# Patient Record
Sex: Male | Born: 1975 | Race: White | Hispanic: No | Marital: Single | Smoking: Current every day smoker
Health system: Southern US, Community
[De-identification: ages and names within clinical notes are randomized; demographics above are authoritative.]

## PROBLEM LIST (undated history)

## (undated) DIAGNOSIS — I10 Essential (primary) hypertension: Secondary | ICD-10-CM

## (undated) DIAGNOSIS — J45909 Unspecified asthma, uncomplicated: Secondary | ICD-10-CM

## (undated) DIAGNOSIS — F250 Schizoaffective disorder, bipolar type: Secondary | ICD-10-CM

## (undated) DIAGNOSIS — E119 Type 2 diabetes mellitus without complications: Secondary | ICD-10-CM

## (undated) DIAGNOSIS — Z5900 Homelessness unspecified: Secondary | ICD-10-CM

## (undated) DIAGNOSIS — Z765 Malingerer [conscious simulation]: Secondary | ICD-10-CM

## (undated) DIAGNOSIS — G629 Polyneuropathy, unspecified: Secondary | ICD-10-CM

---

## 1997-11-08 ENCOUNTER — Encounter: Admission: RE | Admit: 1997-11-08 | Discharge: 1997-11-08 | Payer: Self-pay | Admitting: *Deleted

## 1999-09-21 ENCOUNTER — Encounter: Payer: Self-pay | Admitting: Emergency Medicine

## 1999-09-21 ENCOUNTER — Encounter: Payer: Self-pay | Admitting: Surgery

## 1999-09-21 ENCOUNTER — Inpatient Hospital Stay (HOSPITAL_COMMUNITY): Admission: EM | Admit: 1999-09-21 | Discharge: 1999-09-22 | Payer: Self-pay | Admitting: Emergency Medicine

## 2002-02-02 ENCOUNTER — Emergency Department (HOSPITAL_COMMUNITY): Admission: EM | Admit: 2002-02-02 | Discharge: 2002-02-02 | Payer: Self-pay

## 2002-03-27 ENCOUNTER — Emergency Department (HOSPITAL_COMMUNITY): Admission: EM | Admit: 2002-03-27 | Discharge: 2002-03-27 | Payer: Self-pay | Admitting: Emergency Medicine

## 2002-06-15 ENCOUNTER — Inpatient Hospital Stay (HOSPITAL_COMMUNITY): Admission: EM | Admit: 2002-06-15 | Discharge: 2002-06-18 | Payer: Self-pay | Admitting: Psychiatry

## 2018-05-22 ENCOUNTER — Encounter (HOSPITAL_COMMUNITY): Payer: Self-pay | Admitting: Behavioral Health

## 2018-05-22 ENCOUNTER — Ambulatory Visit (HOSPITAL_COMMUNITY)
Admission: RE | Admit: 2018-05-22 | Discharge: 2018-05-22 | Disposition: A | Discharge status: Home / Self Care | Payer: Self-pay | Attending: Psychiatry | Admitting: Psychiatry

## 2018-05-22 DIAGNOSIS — F250 Schizoaffective disorder, bipolar type: Secondary | ICD-10-CM | POA: Insufficient documentation

## 2018-05-22 DIAGNOSIS — F17210 Nicotine dependence, cigarettes, uncomplicated: Secondary | ICD-10-CM | POA: Insufficient documentation

## 2018-05-22 NOTE — H&P (Signed)
Behavioral Health Medical Screening Exam  Alejandro Baker is an 43 y.o. male presents as a walk in at [facility] with complaints that he needs to get prescriptions for his medication because he just ran out but doesn't have a psychiatrist to see established.  Move to [place] form [place] in September 2019.  Patient denies suicidal/self-harm/homicidal ideation psychosis, and paranoia.     Total Time spent with patient: 30 minutes  Psychiatric Specialty Exam: Physical Exam  Constitutional: He is oriented to person, place, and time. He appears well-developed and well-nourished. No distress.  Neck: Normal range of motion. Neck supple.  Respiratory: Effort normal and breath sounds normal.  Musculoskeletal: Normal range of motion.  Neurological: He is alert and oriented to person, place, and time.  Skin: Skin is warm and dry.  Psychiatric: He has a normal mood and affect. His speech is normal and behavior is normal. Judgment and thought content normal. Cognition and memory are normal.    Review of Systems  Psychiatric/Behavioral: Depression: Denies. Hallucinations: Denies. Substance abuse: Denies. Suicidal ideas: denies. Nervous/anxious: Denies. Insomnia: Denies.        Patient has a history of schizophrenia and has ran out of his medication.  Patient states that he moved home from [place] to stay with his mother in September 2019.  States that he has not established outpatient psychiatric services but needs refill on his medications.  MD in [place]  won't do it for him.    All other systems reviewed and are negative.   Blood pressure (!) 135/92, pulse (!) 115, resp. rate 16.There is no height or weight on file to calculate BMI.  General Appearance: Casual  Eye Contact:  Good  Speech:  Clear and Coherent  Volume:  Normal  Mood:  Appropriate  Affect:  Congruent  Thought Process:  Coherent  Orientation:  Full (Time, Place, and Person)  Thought Content:  Denies hallucinations, delusions, and paranoia   Suicidal Thoughts:  No  Homicidal Thoughts:  No  Memory:  Immediate;   Fair Recent;   Fair Remote;   Fair  Judgement:  Intact  Insight:  Present  Psychomotor Activity:  Normal  Concentration: Concentration: Good and Attention Span: Good  Recall:  [employer] of Knowledge:Fair  Language: Good  Akathisia:  No  Handed:  Right  AIMS (if indicated):     Assets:  Communication Skills Desire for Improvement Housing Social Support  Sleep:       Musculoskeletal: Strength & Muscle Tone: within normal limits Gait & Station: normal Patient leans: N/A  Blood pressure (!) 135/92, pulse (!) 115, resp. rate 16.  Recommendations:  Outpatient psychiatric services.  Referral/resources given for outpatient psychiatric services.  Patient informed that he could do a walk in tomorrow morning at [facility]  Need to be there early 8AM - 3PM  Based on my evaluation the patient does not appear to have an emergency medical condition.   Disposition: No evidence of imminent risk to self or others at present.   Patient does not meet criteria for psychiatric inpatient admission. Supportive therapy provided about ongoing stressors. Discussed crisis plan, support from social network, calling 911, coming to the Emergency Department, and calling Suicide Hotline.  Shuvon Rankin, NP 05/22/2018, 2:12 PM

## 2018-05-22 NOTE — BH Assessment (Signed)
Behavioral Health Assessment Note:   Patient is a 43 year old male who presented to [facility] with his mother seeking medication prescriptions.  Patient states that he took his last dose of medication four days ago.  Patient states that he was living in [place], but moved back to [place] from [place] and patient states that he does not have a provider as of yet.  Patient states that he has been diagnosed with schizoaffective disorder bipolar type.  Patient states that he is not currently suicidal, homicidal or psychotic.  Patient states that he has a previous suicide attempt from 15 years ago and states that he was hospitalized at [facility] in 2004. Patient denies having any access to weapons.  Patient denies having any substance abuse issues. Patient states that he sleeps on average six hours per night and he states that his appetite is good.  He states that he has self-mutilated on one occasion 10-15 years ago and states that he never did it again. States that he has no history of any abuse.  Patient was pleasant and cooperative, alert and oriented.  His judgment, insight and impulse control were slightly impaired.  Patient states that he has been short tempered lately.  He did not appear to be responding to any internal stimuli. His speech was clear, his eye contact was good.  His psycho-motor activity was normal, his thoughts organized and his memory was intact.  Patient was able to contract for safety outside of a hospital setting.                                                                                                                                                                                Diagnosis: F25.0 Schizoaffective Disorder Bipolar Disorder  Past Medical History: No past medical history on file.   Family History: No family history on file.  Social History:  reports that he has been smoking. He has a 15.00 pack-year smoking history. He does not have any smokeless tobacco  history on file. He reports previous alcohol use. He reports previous drug use.  Additional Social History:  Alcohol / Drug Use Pain Medications: see MAR Prescriptions: see MAR Over the Counter: see MAR History of alcohol / drug use?: No history of alcohol / drug abuse Longest period of sobriety (when/how long): N/A  CIWA: CIWA-Ar BP: (!) 135/92 Pulse Rate: (!) 115 COWS:    Allergies: Allergies not on file  Home Medications: (Not in a hospital admission)   OB/GYN Status:  No LMP for male patient.  General Assessment Data Location of Assessment: [facility] Services TTS Assessment: In system Is this a Tele or Face-to-Face Assessment?: Face-to-Face Is this an Initial Assessment  or a Re-assessment for this encounter?: Initial Assessment Patient Accompanied by:: Parent Language Other than English: No Living Arrangements: Other (Comment)(staying with brother) What gender do you identify as?: Male Marital status: Single Living Arrangements: Other relatives Can pt return to current living arrangement?: Yes Admission Status: Voluntary Is patient capable of signing voluntary admission?: Yes Referral Source: Self/Family/Friend Insurance type: Counselling psychologist([employer] Medicare)     Crisis Care Plan Living Arrangements: Other relatives Legal Guardian: Other:(self) Name of Psychiatrist: none Name of Therapist: none  Education Status Is patient currently in school?: No Is the patient employed, unemployed or receiving disability?: Receiving disability income  Risk to self with the past 6 months Suicidal Ideation: No Has patient been a risk to self within the past 6 months prior to admission? : No Suicidal Intent: No Has patient had any suicidal intent within the past 6 months prior to admission? : No Is patient at risk for suicide?: No Suicidal Plan?: No Has patient had any suicidal plan within the past 6 months prior to admission? : No Access to Means: No What has been your use of  drugs/alcohol within the last 12 months?: none Previous Attempts/Gestures: Yes How many times?: 1(2004) Other Self Harm Risks: (off medication, rage issues) Triggers for Past Attempts: None known Intentional Self Injurious Behavior: Cutting(fifteen years ago) Comment - Self Injurious Behavior: (hx of cutting 15 years ago on one occasion) Family Suicide History: No Recent stressful life event(s): Other (Comment)([place] Persecutory voices/beliefs?: No Depression: Yes Depression Symptoms: Insomnia, Isolating, Loss of interest in usual pleasures Substance abuse history and/or treatment for substance abuse?: No Suicide prevention information given to non-admitted patients: Not applicable  Risk to Others within the past 6 months Homicidal Ideation: No Does patient have any lifetime risk of violence toward others beyond the six months prior to admission? : No Thoughts of Harm to Others: No Current Homicidal Intent: No Current Homicidal Plan: No Access to Homicidal Means: No Identified Victim: none History of harm to others?: No Assessment of Violence: None Noted Violent Behavior Description: none Does patient have access to weapons?: No Criminal Charges Pending?: No Does patient have a court date: No Is patient on probation?: No  Psychosis Hallucinations: None noted Delusions: None noted  Mental Status Report Appearance/Hygiene: Disheveled Eye Contact: Good Motor Activity: Freedom of movement Speech: Unremarkable Level of Consciousness: Alert Mood: Depressed Affect: Flat Anxiety Level: None Thought Processes: Coherent, Relevant Judgement: Partial Orientation: Person, Place, Time, Situation Obsessive Compulsive Thoughts/Behaviors: None  Cognitive Functioning Concentration: Decreased Memory: Recent Intact, Remote Intact Is patient IDD: No Insight: Fair Impulse Control: Poor Appetite: Good Have you had any weight changes? : No  Change Sleep: No Change Total Hours of Sleep: 6 Vegetative Symptoms: Decreased grooming  ADLScreening [facility] Assessment [facility]) Patient's cognitive ability adequate to safely complete daily activities?: Yes Patient able to express need for assistance with ADLs?: Yes Independently performs ADLs?: Yes (appropriate for developmental age)  Prior Inpatient Therapy Prior Inpatient Therapy: Yes Prior Therapy Dates: 2004 [facility] Reason for Treatment: schizoaffective disorder  Prior Outpatient Therapy Prior Outpatient Therapy: Yes Prior Therapy Dates: ongoing until move to [place] Prior Therapy Facilty/Provider(s): (unknown in [place]) Reason for Treatment: (schizophrenis) Does patient have an ACCT team?: Yes Does patient have Intensive In-House Services?  : No Does patient have [facility] services? : No Does patient have P4CC services?: No  ADL Screening (condition at time of admission) Patient's cognitive ability adequate to safely complete daily activities?: Yes Is the patient  deaf or have difficulty hearing?: No Does the patient have difficulty seeing, even when wearing glasses/contacts?: No Does the patient have difficulty concentrating, remembering, or making decisions?: No Patient able to express need for assistance with ADLs?: Yes Does the patient have difficulty dressing or bathing?: No Independently performs ADLs?: Yes (appropriate for developmental age) Does the patient have difficulty walking or climbing stairs?: No Weakness of Legs: None Weakness of Arms/Hands: None  Home Assistive Devices/Equipment Home Assistive Devices/Equipment: None  Therapy Consults (therapy consults require a physician order) PT Evaluation Needed: No OT Evalulation Needed: No SLP Evaluation Needed: No Abuse/Neglect Assessment (Assessment to be complete while patient is alone) Abuse/Neglect Assessment Can Be Completed: Yes Physical Abuse: Denies Verbal Abuse:  Denies Sexual Abuse: Denies Exploitation of patient/patient's resources: Denies Self-Neglect: Denies Values / Beliefs Cultural Requests During Hospitalization: None Spiritual Requests During Hospitalization: None Consults Spiritual Care Consult Needed: No Social Work Consult Needed: No Merchant navy officer (For Healthcare) Does Patient Have a Medical Advance Directive?: No Would patient like information on creating a medical advance directive?: No - Patient declined Nutrition Screen- [facility] Adult/WL/AP Has the patient recently lost weight without trying?: No Has the patient been eating poorly because of a decreased appetite?: No Malnutrition Screening Tool Score: 0      Disposition: Per Shuvon Rankin, NP, patient does not meet inpatient admission criteria and can be referred to an outpatient provider.  Patient was given referral information on [address], [address] of the [place], [place], [facility] and [facility] behavioral Health at the [facility].   Disposition Initial Assessment Completed for this Encounter: Yes Patient referred to: Other (Comment)  On Site Evaluation by:   Reviewed with Physician:    Arnoldo Lenis Yaslin Kirtley 05/22/2018 2:54 PM

## 2020-03-04 ENCOUNTER — Other Ambulatory Visit: Payer: Self-pay

## 2020-03-04 ENCOUNTER — Emergency Department (HOSPITAL_COMMUNITY)
Admission: EM | Admit: 2020-03-04 | Discharge: 2020-03-04 | Disposition: A | Discharge status: Home / Self Care | Payer: Medicare Other | Attending: Emergency Medicine | Admitting: Emergency Medicine

## 2020-03-04 ENCOUNTER — Encounter (HOSPITAL_COMMUNITY): Payer: Self-pay

## 2020-03-04 DIAGNOSIS — Z719 Counseling, unspecified: Secondary | ICD-10-CM

## 2020-03-04 DIAGNOSIS — Z1152 Encounter for screening for COVID-19: Secondary | ICD-10-CM | POA: Diagnosis present

## 2020-03-04 DIAGNOSIS — R456 Violent behavior: Secondary | ICD-10-CM | POA: Diagnosis not present

## 2020-03-04 DIAGNOSIS — Z710 Person encountering health services to consult on behalf of another person: Secondary | ICD-10-CM | POA: Diagnosis not present

## 2020-03-04 DIAGNOSIS — F17200 Nicotine dependence, unspecified, uncomplicated: Secondary | ICD-10-CM | POA: Insufficient documentation

## 2020-03-04 NOTE — ED Provider Notes (Signed)
[facility]-EMERGENCY DEPT Provider Note   CSN: [id number] Arrival date & time: 03/04/20  1049     History Chief Complaint  Patient presents with  . Mental evaluation  . Rash    Alejandro Baker is a 44 y.o. male.  HPI      Alejandro Baker is a 44 y.o. male, presenting to the ED apparently requesting testing for COVID-19.  This information was only able to be obtained through the triage note as the patient refused to speak with me.  Upon my attempt at initial interview, patient continued to sleep.  When I tried to wake him, he eventually turned over, pointed at me, and yelled, "Fuck off! If you fucking touch me or talk to me again, I'll fucking shut you up!"  I told him I was trying to help him and that yelling, foul language, threats, and aggressive behavior would not be tolerated.   When I returned to speak with the patient, this time he jumped out of the bed quickly and aggressively ran towards me yelling profanities. He got within a couple feet of me, stuck his finger in my face and screamed, "If you don't leave me alone, I will fuck you up!"  History reviewed. No pertinent past medical history.  There are no problems to display for this patient.   History reviewed. No pertinent surgical history.     No family history on file.  Social History   Tobacco Use  . Smoking status: Current Every Day Smoker    Packs/day: 1.00    Years: 15.00    Pack years: 15.00  Substance Use Topics  . Alcohol use: Not Currently  . Drug use: Not Currently    Home Medications Prior to Admission medications   Not on File    Allergies    Patient has no known allergies.  Review of Systems   Review of Systems  Unable to perform ROS: Other (Refused to answer any questions)    Physical Exam Updated Vital Signs BP 132/86 (BP Location: Left Arm)   Pulse 90   Temp 98.2 F (36.8 C) (Oral)   Resp 16   SpO2 98%   Physical Exam Vitals and nursing note reviewed.   Constitutional:      General: He is not in acute distress.    Appearance: He is well-developed. He is not diaphoretic.  HENT:     Head: Normocephalic and atraumatic.  Eyes:     Conjunctiva/sclera: Conjunctivae normal.  Cardiovascular:     Rate and Rhythm: Normal rate.     Comments: Normal rate per triage vitals. Pulmonary:     Effort: Pulmonary effort is normal.  Musculoskeletal:     Cervical back: Neck supple.  Skin:    General: Skin is warm and dry.     Coloration: Skin is not pale.  Neurological:     Mental Status: He is alert.     Comments: Moves all 4 extremities.  Coordinated and able to ambulate quickly without noted difficulty. No noted facial droop or slurred speech. He was able to identify the different persons in the ED by role.  Psychiatric:        Mood and Affect: Affect is angry.        Behavior: Behavior is aggressive.     ED Results / Procedures / Treatments   Labs (all labs ordered are listed, but only abnormal results are displayed) Labs Reviewed - No data to display  EKG None  Radiology No  results found.  Procedures Procedures (including critical care time)  Medications Ordered in ED Medications - No data to display  ED Course  I have reviewed the triage vital signs and the nursing notes.  Pertinent labs & imaging results that were available during my care of the patient were reviewed by me and considered in my medical decision making (see chart for details).    MDM Rules/Calculators/A&P                          Patient presents reportedly requesting Covid testing at time of triage. Patient was verbally abusive and physically aggressive. These threats and behaviors were focused and had a discrete beginning and end during interactions I had with him.   Patient's presentation does not seem to be consistent with emergent mental health crisis, psychosis, or other emergency medical condition. No reported SI/HI. Patient was offered Covid  testing by the nurse prior to discharge, but declined, reportedly stating, "I do not need anything.  I just want to be escorted out."  Findings and plan of care discussed with Marguarite Arbour, MD.   Final Clinical Impression(s) / ED Diagnoses Final diagnoses:  Consultation without specific complaint    Rx / DC Orders ED Discharge Orders    None       Concepcion Living 03/04/20 1357    Terald Sleeper, MD 03/04/20 1422

## 2020-03-04 NOTE — ED Triage Notes (Addendum)
Pt presents with police voluntary. Police report he is here for some "unspecified mental evaluation". Pt reports he has poison ivy and is here for a covid test so he can go back to the homeless shelter. Pt is not the best historian. Pt does not deny SI/HI.

## 2020-03-04 NOTE — ED Notes (Addendum)
Pt asked if he could be swabbed for COVID.  Pt declined and stated, "I want to be escorted out."  Pt declined any further care including obtaining vital signs. Security guard present. Shawn, PA made aware.

## 2020-03-04 NOTE — ED Notes (Signed)
Went into room with PA to attempt to assess and pt was asleep.  Pt was woken up and pt verbally threatened medical staff. Pt stated, "If he doesn't get out of my face, I'm going to hit him."

## 2020-03-04 NOTE — ED Notes (Signed)
Pt has been changed into scrubs and wanded by Alvino Chapel with security. Pt has 1 huge clear plastic bag full of belongings and it is located in the day room in [place]

## 2020-03-15 ENCOUNTER — Emergency Department (HOSPITAL_COMMUNITY)
Admission: EM | Admit: 2020-03-15 | Discharge: 2020-03-15 | Disposition: A | Discharge status: Home / Self Care | Payer: Medicare Other | Attending: Emergency Medicine | Admitting: Emergency Medicine

## 2020-03-15 DIAGNOSIS — R739 Hyperglycemia, unspecified: Secondary | ICD-10-CM | POA: Insufficient documentation

## 2020-03-15 DIAGNOSIS — Z5900 Homelessness unspecified: Secondary | ICD-10-CM | POA: Diagnosis not present

## 2020-03-15 DIAGNOSIS — X31XXXA Exposure to excessive natural cold, initial encounter: Secondary | ICD-10-CM | POA: Insufficient documentation

## 2020-03-15 DIAGNOSIS — F17200 Nicotine dependence, unspecified, uncomplicated: Secondary | ICD-10-CM | POA: Diagnosis not present

## 2020-03-15 DIAGNOSIS — T699XXA Effect of reduced temperature, unspecified, initial encounter: Secondary | ICD-10-CM

## 2020-03-15 LAB — CBG MONITORING, ED: Glucose-Capillary: 280 mg/dL — ABNORMAL HIGH (ref 70–99)

## 2020-03-15 NOTE — ED Provider Notes (Signed)
[facility]-EMERGENCY DEPT Provider Note   CSN: [id number] Arrival date & time: 03/15/20  0215     History Chief Complaint  Patient presents with  . Cold Exposure    Alejandro Baker is a 44 y.o. male.   44 year old male transported to the emergency department by EMS who found the patient sleeping outside.  He has no acute complaints; denies pain.  Reports being a diabetic, but is not on insulin.  He has been homeless for approximately 2 months.  Recently came back to [place].  The history is provided by the patient. No language interpreter was used.       No past medical history on file.  There are no problems to display for this patient.   No past surgical history on file.     No family history on file.  Social History   Tobacco Use  . Smoking status: Current Every Day Smoker    Packs/day: 1.00    Years: 15.00    Pack years: 15.00  Substance Use Topics  . Alcohol use: Not Currently  . Drug use: Not Currently    Home Medications Prior to Admission medications   Not on File    Allergies    Penicillins and Povidone-iodine  Review of Systems   Review of Systems  Ten systems reviewed and are negative for acute change, except as noted in the HPI.    Physical Exam Updated Vital Signs BP 109/84   Pulse 81   Temp 98.2 F (36.8 C) (Oral)   Resp (!) 29   Ht 6' (1.829 m)   Wt 79.8 kg   SpO2 97%   BMI 23.87 kg/m   Physical Exam Vitals and nursing note reviewed.  Constitutional:      General: He is not in acute distress.    Appearance: He is well-developed. He is not diaphoretic.     Comments: Nontoxic appearing. Tanned skin.  HENT:     Head: Normocephalic and atraumatic.  Eyes:     General: No scleral icterus.    Conjunctiva/sclera: Conjunctivae normal.  Pulmonary:     Effort: Pulmonary effort is normal. No respiratory distress.     Comments: Respirations even and unlabored Musculoskeletal:        General: Normal range  of motion.     Cervical back: Normal range of motion.  Skin:    General: Skin is warm and dry.     Coloration: Skin is not pale.     Findings: No erythema or rash.  Neurological:     Mental Status: He is alert and oriented to person, place, and time.     Comments: Moving extremities spontaneously.  Psychiatric:        Behavior: Behavior normal.     ED Results / Procedures / Treatments   Labs (all labs ordered are listed, but only abnormal results are displayed) Labs Reviewed  CBG MONITORING, ED    EKG None  Radiology No results found.  Procedures Procedures (including critical care time)  Medications Ordered in ED Medications - No data to display  ED Course  I have reviewed the triage vital signs and the nursing notes.  Pertinent labs & imaging results that were available during my care of the patient were reviewed by me and considered in my medical decision making (see chart for details).  Clinical Course as of Mar 16 411  Tue Mar 15, 2020  0323 CBG in the ED is 280; result not crossing over.   [  KH]    Clinical Course User Index [KH] Antony Madura, PA-C   MDM Rules/Calculators/A&P                          44 year old male brought in by EMS after he was found sleeping in the cold.  He is homeless and has no acute complaints.  Has diabetes with CBG of 280 in the ED.  Vital signs stable.  Kept in the ED for a few hours to limit environmental exposure.  No indication for further emergent work-up.  Discharged with resource guide for local shelters.   Final Clinical Impression(s) / ED Diagnoses Final diagnoses:  Homeless  Exposure to environmental cold, initial encounter  Hyperglycemia    Rx / DC Orders ED Discharge Orders    None       Antony Madura, PA-C 03/15/20 0413    Devoria Albe, MD 03/15/20 [phone]

## 2020-03-15 NOTE — ED Notes (Signed)
Upon discharge, pt refused to get out of the bed and exit the hospital.  Security notified of situation.  Upon security arrival, pt became belligerent and swung at staff.  Pt was picked up out of the stretcher and escorted to the exit at this time.  No staff or patients were injured throughout the episode.  Pt ambulating without assistance upon exit from the facility.

## 2020-03-15 NOTE — ED Notes (Signed)
Pt FSBG 280

## 2020-03-15 NOTE — ED Triage Notes (Signed)
Pt to ER via EMS.  Per EMS, pt was sleeping outside when they found him.  Pt reported being cold and a diabetic.  EMS reports a FSBG of 280.  PT A&O x 3 at this time.  NADN.

## 2020-03-20 ENCOUNTER — Other Ambulatory Visit: Payer: Self-pay

## 2020-03-20 ENCOUNTER — Emergency Department (HOSPITAL_COMMUNITY)
Admission: EM | Admit: 2020-03-20 | Discharge: 2020-03-20 | Disposition: A | Discharge status: Home / Self Care | Payer: Medicare Other | Attending: Emergency Medicine | Admitting: Emergency Medicine

## 2020-03-20 DIAGNOSIS — L089 Local infection of the skin and subcutaneous tissue, unspecified: Secondary | ICD-10-CM

## 2020-03-20 DIAGNOSIS — F17210 Nicotine dependence, cigarettes, uncomplicated: Secondary | ICD-10-CM | POA: Diagnosis not present

## 2020-03-20 DIAGNOSIS — Z5900 Homelessness unspecified: Secondary | ICD-10-CM | POA: Diagnosis not present

## 2020-03-20 DIAGNOSIS — R739 Hyperglycemia, unspecified: Secondary | ICD-10-CM | POA: Diagnosis not present

## 2020-03-20 DIAGNOSIS — Y33XXXA Other specified events, undetermined intent, initial encounter: Secondary | ICD-10-CM | POA: Insufficient documentation

## 2020-03-20 DIAGNOSIS — E119 Type 2 diabetes mellitus without complications: Secondary | ICD-10-CM | POA: Insufficient documentation

## 2020-03-20 DIAGNOSIS — M79672 Pain in left foot: Secondary | ICD-10-CM | POA: Diagnosis present

## 2020-03-20 DIAGNOSIS — S90822A Blister (nonthermal), left foot, initial encounter: Secondary | ICD-10-CM | POA: Insufficient documentation

## 2020-03-20 DIAGNOSIS — Z7984 Long term (current) use of oral hypoglycemic drugs: Secondary | ICD-10-CM | POA: Insufficient documentation

## 2020-03-20 LAB — COMPREHENSIVE METABOLIC PANEL
ALT: 24 U/L (ref 0–44)
AST: 34 U/L (ref 15–41)
Albumin: 4.1 g/dL (ref 3.5–5.0)
Alkaline Phosphatase: 90 U/L (ref 38–126)
Anion gap: 12 (ref 5–15)
BUN: 18 mg/dL (ref 6–20)
CO2: 26 mmol/L (ref 22–32)
Calcium: 9.2 mg/dL (ref 8.9–10.3)
Chloride: 97 mmol/L — ABNORMAL LOW (ref 98–111)
Creatinine, Ser: 1.14 mg/dL (ref 0.61–1.24)
GFR, Estimated: >60 mL/min (ref >60)
Glucose, Bld: 414 mg/dL — ABNORMAL HIGH (ref 70–99)
Potassium: 5 mmol/L (ref 3.5–5.1)
Sodium: 135 mmol/L (ref 135–145)
Total Bilirubin: 1.1 mg/dL (ref 0.3–1.2)
Total Protein: 7.7 g/dL (ref 6.5–8.1)

## 2020-03-20 LAB — CBG MONITORING, ED: Glucose-Capillary: 357 mg/dL — ABNORMAL HIGH (ref 70–99)

## 2020-03-20 MED ORDER — DOXYCYCLINE HYCLATE 100 MG PO TABS
100.0000 mg | ORAL_TABLET | Freq: Once | ORAL | Status: DC
  Filled 2020-03-20: qty 1

## 2020-03-20 MED ORDER — METFORMIN HCL 500 MG PO TABS: 500.0000 mg | ORAL_TABLET | Freq: Two times a day (BID) | ORAL | 0 refills | Status: DC

## 2020-03-20 MED ORDER — DOXYCYCLINE HYCLATE 100 MG PO CAPS
100.0000 mg | ORAL_CAPSULE | Freq: Two times a day (BID) | ORAL | 0 refills | Status: AC
Start: 2020-03-20 — End: 2020-03-27

## 2020-03-20 MED ORDER — INSULIN ASPART 100 UNIT/ML ~~LOC~~ SOLN
10.0000 [IU] | Freq: Once | SUBCUTANEOUS | Status: DC
  Filled 2020-03-20: qty 0.1

## 2020-03-20 NOTE — ED Provider Notes (Signed)
[facility]-EMERGENCY DEPT Provider Note   CSN: [id number] Arrival date & time: 03/20/20  0158     History Chief Complaint  Patient presents with  . Foot Pain    Alejandro Baker is a 44 y.o. male with a history of homelessness and diabetes mellitus who presents the emergency department with a chief complaint of bilateral foot pain.  History is limited as patient is uncooperative and refuses to answer questions until security is at bedside because he refuses to take off his to his for examination.  He is endorsing bilateral foot pain for an extended amount of time.  He is unable to tell me if it is worse today.  He denies any fevers or chills, but is afebrile in the emergency department today.  He denies any numbness or weakness, but then refuses to answer any other questions.   He has a history of diabetes mellitus per chart review.  States that he does not take any medications.  He does not know his previous medications and refuses to answer any additional questions.  The history is provided by the patient and medical records. No language interpreter was used.       No past medical history on file.  There are no problems to display for this patient.   No past surgical history on file.     No family history on file.  Social History   Tobacco Use  . Smoking status: Current Every Day Smoker    Packs/day: 1.00    Years: 15.00    Pack years: 15.00  Substance Use Topics  . Alcohol use: Not Currently  . Drug use: Not Currently    Home Medications Prior to Admission medications   Medication Sig Start Date End Date Taking? Authorizing Provider  doxycycline (VIBRAMYCIN) 100 MG capsule Take 1 capsule (100 mg total) by mouth 2 (two) times daily for 7 days. 03/20/20 03/27/20  Reshanda Lewey A, PA-C  metFORMIN (GLUCOPHAGE) 500 MG tablet Take 1 tablet (500 mg total) by mouth 2 (two) times daily with a meal. 03/20/20 04/19/20  Mory Herrman A, PA-C     Allergies    Penicillins and Povidone-iodine  Review of Systems   Review of Systems  Constitutional: Negative for appetite change and fever.  HENT: Negative for congestion and sore throat.   Respiratory: Negative for cough, shortness of breath and wheezing.   Cardiovascular: Negative for chest pain, palpitations and leg swelling.  Gastrointestinal: Negative for abdominal pain, diarrhea, nausea and vomiting.  Genitourinary: Negative for dysuria.  Musculoskeletal: Positive for arthralgias and myalgias. Negative for back pain, gait problem, joint swelling, neck pain and neck stiffness.  Skin: Positive for color change and wound. Negative for rash.  Allergic/Immunologic: Negative for immunocompromised state.  Neurological: Negative for seizures, syncope, weakness, numbness and headaches.  Psychiatric/Behavioral: Negative for confusion.    Physical Exam Updated Vital Signs BP 126/87 (BP Location: Left Arm)   Pulse (!) 101   Temp 98.3 F (36.8 C) (Oral)   Resp 16   SpO2 95%   Physical Exam Vitals and nursing note reviewed.  Constitutional:      Appearance: He is well-developed. He is not ill-appearing, toxic-appearing or diaphoretic.     Comments: Disheveled.  HENT:     Head: Normocephalic.  Eyes:     Conjunctiva/sclera: Conjunctivae normal.  Cardiovascular:     Rate and Rhythm: Normal rate and regular rhythm.     Pulses: Normal pulses.     Heart sounds: Normal  heart sounds. No murmur heard.  No friction rub. No gallop.   Pulmonary:     Effort: Pulmonary effort is normal. No respiratory distress.     Breath sounds: No wheezing or rales.  Abdominal:     General: There is no distension.     Palpations: Abdomen is soft.     Tenderness: There is no abdominal tenderness.  Musculoskeletal:     Cervical back: Neck supple.     Right lower leg: No edema.     Left lower leg: No edema.     Comments: Soles of the bilateral feet covered in dirt.  There is a superficial  ulceration noted to the dorsum of the left foot.  There is some surrounding redness, but no warmth.  No drainage.  No red streaking.  No other associated ones of bilateral feet.  No tenderness palpation.  Full active and passive range of motion of bilateral ankles.  Neurovascular intact.  Skin:    General: Skin is warm and dry.  Neurological:     Mental Status: He is alert.  Psychiatric:        Behavior: Behavior normal.     ED Results / Procedures / Treatments   Labs (all labs ordered are listed, but only abnormal results are displayed) Labs Reviewed  COMPREHENSIVE METABOLIC PANEL - Abnormal; Notable for the following components:      Result Value   Chloride 97 (*)    Glucose, Bld 414 (*)    All other components within normal limits  CBG MONITORING, ED - Abnormal; Notable for the following components:   Glucose-Capillary 357 (*)    All other components within normal limits    EKG None  Radiology No results found.  Procedures Procedures (including critical care time)  Medications Ordered in ED Medications  insulin aspart (novoLOG) injection 10 Units (10 Units Subcutaneous Refused 03/20/20 0630)  doxycycline (VIBRA-TABS) tablet 100 mg (100 mg Oral Refused 03/20/20 0630)    ED Course  I have reviewed the triage vital signs and the nursing notes.  Pertinent labs & imaging results that were available during my care of the patient were reviewed by me and considered in my medical decision making (see chart for details).    MDM Rules/Calculators/A&P                          44 year old male with a history of homelessness and diabetes mellitus who presents to the emergency department with a chief complaint of bilateral foot pain.  Per triage nurse, the patient was in the lobby and became belligerent with staff and was asked to leave the lobby so instead he checked into the ER.  History is extremely limited as he refuses to cooperate with history or exam until security is at  bedside.  Vital signs are reassuring.  He does have a history of diabetes mellitus and refuses to answer questions about his current treatment.  Point-of-care CBG is 357.  Metabolic panel with glucose of 414.  Bicarb and anion gap are normal.  No evidence of DKA or HHS.   On exam, he has a very superficial, developing ulceration noted to the dorsum of the left foot.  There is no abscess or drainage at this time.  No surrounding in duration or evidence of cellulitis.  However, given his poorly controlled diabetes, I am concerned that this will significantly worsen.  Patient does have a history of homelessness.  After reviewing his chart,  it appears that he was previously on Metformin.  I will give him a prescription of this medication and also start him on doxycycline since he does have a penicillin allergy and has no documented courses of cephalosporins.  We will give aspart insulin subq in the ER given hyperglycemia.  The patient was discussed with Dr. Elesa Massed, attending physician who is in agreement with work-up and plan.  ER return precautions given.  He is hemodynamically stable and in no acute distress.  Safe for discharge with outpatient follow-up to the [facility].  Final Clinical Impression(s) / ED Diagnoses Final diagnoses:  Hyperglycemia  Infected blister of left foot, initial encounter    Rx / DC Orders ED Discharge Orders         Ordered    metFORMIN (GLUCOPHAGE) 500 MG tablet  2 times daily with meals        03/20/20 0608    doxycycline (VIBRAMYCIN) 100 MG capsule  2 times daily        03/20/20 0608           Frederik Pear A, PA-C 03/20/20 0751    Ward, Layla Maw, DO 03/20/20 2306

## 2020-03-20 NOTE — ED Notes (Signed)
Woke patient up to give him his medication. He refuses the medication. States "ya'll are gonna burn." Hep put his shoes back on and left. Encouraged that he take the meds, he said no once again. Threw his trash on the floor on the way out.

## 2020-03-20 NOTE — ED Notes (Signed)
Pt refused discharge vitals. He also threw his papers and prescriptions down.

## 2020-03-20 NOTE — ED Triage Notes (Signed)
Pt reports bilateral foot pain. States that he is diabetic. Patient checked in because he was asked to leave the lobby because he was being disruptive and aggressive with staff.

## 2020-03-20 NOTE — Discharge Instructions (Signed)
Thank you for allowing me to care for you today in the Emergency Department.   Your blood sugar was high today.  You were given a dose of insulin.  I am refilling your home Metformin.  Please follow-up with Phillips Odor at the [facility].  She can help you obtain her medications.  Her information is listed above.  You were starting to develop an ulceration on your left foot.  It is very important that you treat this early so that it does not become more severe.  You were given 1 dose of doxycycline in the ER since you have a penicillin allergy.  Please take 1 tablet of doxycycline 2 times daily for the next week.  Marianne at the [facility] can also help you fill this medication.  Return the emergency department if you develop red streaking up the leg, fevers, chills, if you start any thick, mucus-like drainage from the foot, uncontrollable vomiting, confusion, abdominal pain, or other new, concerning symptoms.

## 2020-04-21 ENCOUNTER — Encounter (HOSPITAL_COMMUNITY): Payer: Self-pay | Admitting: Emergency Medicine

## 2020-04-21 ENCOUNTER — Emergency Department (HOSPITAL_COMMUNITY): Admission: EM | Admit: 2020-04-21 | Discharge: 2020-04-21 | Discharge status: Absent without leave | Payer: Self-pay

## 2020-04-21 DIAGNOSIS — E119 Type 2 diabetes mellitus without complications: Secondary | ICD-10-CM | POA: Insufficient documentation

## 2020-04-21 DIAGNOSIS — F17200 Nicotine dependence, unspecified, uncomplicated: Secondary | ICD-10-CM | POA: Insufficient documentation

## 2020-04-21 DIAGNOSIS — M79671 Pain in right foot: Secondary | ICD-10-CM | POA: Diagnosis present

## 2020-04-21 DIAGNOSIS — L03115 Cellulitis of right lower limb: Secondary | ICD-10-CM | POA: Diagnosis not present

## 2020-04-21 NOTE — ED Notes (Signed)
Refuses sugar check

## 2020-04-21 NOTE — ED Triage Notes (Signed)
Patient here from home reporting right foot pain that started days ago. Diabetic sugar 262. Hx of same. Ulcer to left foot.

## 2020-04-22 ENCOUNTER — Emergency Department (HOSPITAL_COMMUNITY)
Admission: EM | Admit: 2020-04-22 | Discharge: 2020-04-22 | Discharge status: Against Medical Advice | Payer: Medicare Other | Source: Home / Self Care | Attending: Emergency Medicine | Admitting: Emergency Medicine

## 2020-04-22 ENCOUNTER — Emergency Department (HOSPITAL_COMMUNITY)
Admission: EM | Admit: 2020-04-22 | Discharge: 2020-04-22 | Disposition: A | Discharge status: Home / Self Care | Payer: Medicare Other | Attending: Emergency Medicine | Admitting: Emergency Medicine

## 2020-04-22 ENCOUNTER — Encounter (HOSPITAL_COMMUNITY): Payer: Self-pay | Admitting: Emergency Medicine

## 2020-04-22 DIAGNOSIS — L03115 Cellulitis of right lower limb: Secondary | ICD-10-CM | POA: Insufficient documentation

## 2020-04-22 DIAGNOSIS — F17200 Nicotine dependence, unspecified, uncomplicated: Secondary | ICD-10-CM | POA: Insufficient documentation

## 2020-04-22 DIAGNOSIS — M79671 Pain in right foot: Secondary | ICD-10-CM

## 2020-04-22 MED ORDER — DOXYCYCLINE HYCLATE 100 MG PO CAPS: 100.0000 mg | ORAL_CAPSULE | Freq: Two times a day (BID) | ORAL | 0 refills | Status: AC

## 2020-04-22 NOTE — ED Notes (Signed)
Patient walked out with security escorting him of his own volition. Patient did not wait for d/c paperwork or vitals.  Patient walked out of department with a steady gait and in no active distress.

## 2020-04-22 NOTE — ED Provider Notes (Signed)
[facility]-EMERGENCY DEPT Provider Note   CSN: [id number] Arrival date & time: 04/22/20  0157     History Chief Complaint  Patient presents with   Foot Pain    Alejandro Baker is a 44 y.o. male.  The history is provided by the patient and medical records. No language interpreter was used.  Foot Pain This is a new problem. The current episode started more than 2 days ago. The problem occurs constantly. The problem has not changed since onset.Pertinent negatives include no chest pain, no abdominal pain, no headaches and no shortness of breath. Nothing aggravates the symptoms. Nothing relieves the symptoms. He has tried nothing for the symptoms. The treatment provided no relief.       History reviewed. No pertinent past medical history.  There are no problems to display for this patient.   History reviewed. No pertinent surgical history.     No family history on file.  Social History   Tobacco Use   Smoking status: Current Every Day Smoker    Packs/day: 1.00    Years: 15.00    Pack years: 15.00   Smokeless tobacco: Never Used  Substance Use Topics   Alcohol use: Not Currently   Drug use: Not Currently    Home Medications Prior to Admission medications   Medication Sig Start Date End Date Taking? Authorizing Provider  metFORMIN (GLUCOPHAGE) 500 MG tablet Take 1 tablet (500 mg total) by mouth 2 (two) times daily with a meal. 03/20/20 04/19/20  McDonald, Mia A, PA-C    Allergies    Penicillins and Povidone-iodine  Review of Systems   Review of Systems  Constitutional: Negative for chills, diaphoresis, fatigue and fever.  HENT: Negative for congestion.   Eyes: Negative for visual disturbance.  Respiratory: Negative for cough, chest tightness, shortness of breath and wheezing.   Cardiovascular: Negative for chest pain, palpitations and leg swelling.  Gastrointestinal: Negative for abdominal pain, constipation, diarrhea, nausea and  vomiting.  Genitourinary: Negative for flank pain.  Musculoskeletal: Negative for back pain, neck pain and neck stiffness.  Skin: Positive for rash. Negative for wound.  Neurological: Negative for dizziness, weakness, light-headedness and headaches.  Psychiatric/Behavioral: Negative for agitation and confusion.  All other systems reviewed and are negative.   Physical Exam Updated Vital Signs BP (!) 141/96    Pulse 92    Temp 98.3 F (36.8 C) (Oral)    Resp 15    SpO2 99%   Physical Exam Vitals and nursing note reviewed.  Constitutional:      General: He is not in acute distress.    Appearance: He is well-developed and well-nourished. He is not ill-appearing, toxic-appearing or diaphoretic.  HENT:     Head: Normocephalic and atraumatic.     Nose: No congestion or rhinorrhea.     Mouth/Throat:     Pharynx: No oropharyngeal exudate or posterior oropharyngeal erythema.  Eyes:     Extraocular Movements: Extraocular movements intact.     Conjunctiva/sclera: Conjunctivae normal.     Pupils: Pupils are equal, round, and reactive to light.  Cardiovascular:     Rate and Rhythm: Normal rate and regular rhythm.     Heart sounds: No murmur heard.   Pulmonary:     Effort: Pulmonary effort is normal. No respiratory distress.     Breath sounds: Normal breath sounds. No wheezing, rhonchi or rales.  Chest:     Chest wall: No tenderness.  Abdominal:     General: Abdomen is flat.  Palpations: Abdomen is soft.     Tenderness: There is no abdominal tenderness. There is no right CVA tenderness, left CVA tenderness, guarding or rebound.  Musculoskeletal:        General: Tenderness present. No signs of injury or edema.     Cervical back: Neck supple.     Right lower leg: No edema.     Left lower leg: No edema.     Right foot: Normal capillary refill. Tenderness present. No laceration. Normal pulse.     Comments: Small area of erythema on the right medial foot.  No streaking.  No crepitance.   Minimal warmth.  Intact pulses, strength, and sensation.  No deep ulcerations seen.  No bony tenderness.  Low suspicion for osteomyelitis based on exam.  Patient did not want further imaging or labs at this time.  Skin:    General: Skin is warm and dry.     Capillary Refill: Capillary refill takes less than 2 seconds.     Findings: Erythema and rash present.  Neurological:     General: No focal deficit present.     Mental Status: He is alert and oriented to person, place, and time.     Sensory: No sensory deficit.     Motor: No weakness.  Psychiatric:        Mood and Affect: Mood and affect and mood normal.     ED Results / Procedures / Treatments   Labs (all labs ordered are listed, but only abnormal results are displayed) Labs Reviewed - No data to display  EKG None  Radiology No results found.  Procedures Procedures (including critical care time)  Medications Ordered in ED Medications - No data to display  ED Course  I have reviewed the triage vital signs and the nursing notes.  Pertinent labs & imaging results that were available during my care of the patient were reviewed by me and considered in my medical decision making (see chart for details).    MDM Rules/Calculators/A&P                          Alejandro Baker is a 44 y.o. male with unknown past medical history but concern for possible prior diabetes who presents with right foot pain.  He is a difficult historian but he says he has had some right foot pain for the last few days.  He denies any trauma or injuries.  He denies any fevers, chills, chest pain, shortness breath, nausea vomiting constipation, diarrhea, or urinary symptoms.  He reports he is hungry and he wants food and is very tired but he is reporting some mild pain in his foot.  He denies other complaints.  On my exam, patient has intact DP and PT pulse bilaterally.  Patient does have some erythema on the medial side of his right foot.  There is a small  area of erythema but there is no large ulcer seen.  He has intact strength in his feet and can move his feet.  He has good cap refill.  Intact sensation.  Knees nontender, hips nontender, lungs clear, chest nontender, abdomen nontender, back nontender.  Patient is resting comfortably and is just requesting food.  Had a shared decision made conversation with patient.  Patient reports that he does not want any blood work performed at this time.  Given lack of trauma, we agreed to hold on imaging.  Have a low suspicion for osteomyelitis or deep infection.  I do suspect there may be a very small early mild cellulitis on the medial aspect of his right foot.  I offered him labs and further work-up but he would rather get a sandwich.  Patient is amenable to getting a prescription of some antibiotics printed and giving him some food.  Patient will follow up with his PCP and understands return precautions.  Anticipate discharge after eating..  Patient became agitated and was disruptive.  Before I could get his paperwork together for discharge and prescription completion, patient walked of the emergency department.  I was able to ask him if he wanted his paperwork or his prescription for the antibiotics for possible early cellulitis of the foot and he said no.  I went ahead and printed his paperwork if he decides to return and sent in a prescription for the doxycycline we discussed to a pharmacy for him.  He walked with a normal gait without any difficulty.  Patient was escorted out to follow-up with PCP.   Final Clinical Impression(s) / ED Diagnoses Final diagnoses:  Foot pain, right  Cellulitis of right foot    Rx / DC Orders ED Discharge Orders         Ordered    doxycycline (VIBRAMYCIN) 100 MG capsule  2 times daily        04/22/20 0925         Clinical Impression: 1. Foot pain, right   2. Cellulitis of right foot     Disposition: Eloped  Condition: Good  I have discussed the results, Dx and  Tx plan with the pt(& family if present). He/she/they expressed understanding and agree(s) with the plan. Discharge instructions discussed at great length. Strict return precautions discussed and pt &/or family have verbalized understanding of the instructions. No further questions at time of discharge.    Discharge Medication List as of 04/22/2020  9:25 AM    START taking these medications   Details  doxycycline (VIBRAMYCIN) 100 MG capsule Take 1 capsule (100 mg total) by mouth 2 (two) times daily for 7 days., Starting Fri 04/22/2020, Until Fri 04/29/2020, Normal        Follow Up: [facility] [address] [place] [address] [phone] Schedule an appointment as soon as possible for a visit    [facility]ENC[facility] DEPT [address] [phone] mc [place] [address] [phone]       Marybella Ethier, Canary Brim, MD 04/22/20 [phone]

## 2020-04-22 NOTE — Discharge Instructions (Addendum)
Your history and exam today are consistent a very small amount of cellulitis on the right foot.  There is no large ulcer and I do not suspect there is any deep infection at this time.  Your gait was normal and you had no other abnormality on exam.  Initially we recommended labs and imaging however he did not want this.  You ended up leaving before our management could be completed and you did not get your prescription of antibiotics.  I will call in the prescription for antibiotics to your pharmacy for you to pick up.

## 2020-04-22 NOTE — ED Notes (Signed)
RN was in another room with with a patient. Patient was pacing the hallway saying "I do what I want Nigger Bitch" RN told patient to sit down and not use that kind of language as it is inappropriate.  Charge RN made aware and security called to bedside due to escalating aggression.  MD Tegler made aware.
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
## 2020-04-22 NOTE — ED Triage Notes (Signed)
Patient here from home reporting right foot pain that started days ago. Refuses blood sugar check. Refuses new set of vitals.

## 2020-05-29 ENCOUNTER — Emergency Department (HOSPITAL_COMMUNITY)
Admission: EM | Admit: 2020-05-29 | Discharge: 2020-05-29 | Disposition: A | Discharge status: Home / Self Care | Payer: Medicare Other | Attending: Emergency Medicine | Admitting: Emergency Medicine

## 2020-05-29 ENCOUNTER — Other Ambulatory Visit: Payer: Self-pay

## 2020-05-29 DIAGNOSIS — G8929 Other chronic pain: Secondary | ICD-10-CM | POA: Insufficient documentation

## 2020-05-29 DIAGNOSIS — M542 Cervicalgia: Secondary | ICD-10-CM | POA: Insufficient documentation

## 2020-05-29 DIAGNOSIS — F17200 Nicotine dependence, unspecified, uncomplicated: Secondary | ICD-10-CM | POA: Insufficient documentation

## 2020-05-29 MED ORDER — ACETAMINOPHEN 500 MG PO TABS
1000.0000 mg | ORAL_TABLET | Freq: Once | ORAL | Status: AC
  Administered 2020-05-29: 1000 mg via ORAL
  Filled 2020-05-29: qty 2
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
## 2020-05-30 ENCOUNTER — Emergency Department (HOSPITAL_COMMUNITY)
Admission: EM | Admit: 2020-05-30 | Discharge: 2020-05-30 | Disposition: A | Discharge status: Home / Self Care | Payer: Medicare Other | Attending: Emergency Medicine | Admitting: Emergency Medicine

## 2020-05-30 ENCOUNTER — Encounter (HOSPITAL_COMMUNITY): Payer: Self-pay

## 2020-05-30 ENCOUNTER — Other Ambulatory Visit: Payer: Self-pay

## 2020-05-30 ENCOUNTER — Emergency Department (HOSPITAL_COMMUNITY)
Admission: EM | Admit: 2020-05-30 | Discharge: 2020-05-30 | Disposition: A | Discharge status: Home / Self Care | Payer: Medicare Other | Source: Home / Self Care | Attending: Emergency Medicine | Admitting: Emergency Medicine

## 2020-05-30 DIAGNOSIS — Z760 Encounter for issue of repeat prescription: Secondary | ICD-10-CM | POA: Insufficient documentation

## 2020-05-30 DIAGNOSIS — M79672 Pain in left foot: Secondary | ICD-10-CM | POA: Diagnosis not present

## 2020-05-30 DIAGNOSIS — F17200 Nicotine dependence, unspecified, uncomplicated: Secondary | ICD-10-CM | POA: Insufficient documentation

## 2020-05-30 DIAGNOSIS — Z765 Malingerer [conscious simulation]: Secondary | ICD-10-CM | POA: Diagnosis not present

## 2020-05-30 DIAGNOSIS — M79671 Pain in right foot: Secondary | ICD-10-CM | POA: Diagnosis present

## 2020-05-30 MED ORDER — ACETAMINOPHEN 325 MG PO TABS: 650.0000 mg | ORAL_TABLET | Freq: Four times a day (QID) | ORAL | 0 refills | Status: DC | PRN

## 2020-05-30 MED ORDER — METFORMIN HCL 500 MG PO TABS: 500.0000 mg | ORAL_TABLET | Freq: Two times a day (BID) | ORAL | 0 refills | Status: DC

## 2020-05-30 NOTE — ED Triage Notes (Signed)
Patient checked back in after being seen stating he needs his medications refilled.

## 2020-05-30 NOTE — ED Notes (Signed)
I called patient to recheck vitals and no one responded 

## 2020-05-30 NOTE — ED Notes (Signed)
Pt did not respond when called for room 

## 2020-05-30 NOTE — ED Notes (Signed)
Screeners report pt is sitting in the lobby sleeping.

## 2020-05-30 NOTE — ED Triage Notes (Signed)
[place] and told [employer] he needed an ambulance for bilateral foot pain. Patient ambulating with no difficulty.

## 2020-05-30 NOTE — ED Provider Notes (Signed)
[facility]-EMERGENCY DEPT Provider Note   CSN: [id number] Arrival date & time: 05/30/20  0037     History Chief Complaint  Patient presents with  . Foot Pain    Alejandro Baker is a 45 y.o. male.  Here for malingering. Again. Apparently [employer] was called because he was trespassing. He subsequently told them he need medical evaluation 2/2 severe bilateral foot pain. Brought here for eval. Walks without difficulty. It has been present for many years. No change aside from his [employer] runin.   Of note, patient seen here after [employer] picked him up in a similar situation.    Foot Pain       History reviewed. No pertinent past medical history.  There are no problems to display for this patient.   History reviewed. No pertinent surgical history.     No family history on file.  Social History   Tobacco Use  . Smoking status: Current Every Day Smoker    Packs/day: 1.00    Years: 15.00    Pack years: 15.00  . Smokeless tobacco: Never Used  Substance Use Topics  . Alcohol use: Not Currently  . Drug use: Not Currently    Home Medications Prior to Admission medications   Medication Sig Start Date End Date Taking? Authorizing Provider  metFORMIN (GLUCOPHAGE) 500 MG tablet Take 1 tablet (500 mg total) by mouth 2 (two) times daily with a meal. 03/20/20 04/19/20  McDonald, Mia A, PA-C    Allergies    Penicillins and Povidone-iodine  Review of Systems   Review of Systems  All other systems reviewed and are negative.   Physical Exam Updated Vital Signs BP 114/86 (BP Location: Right Arm)   Pulse (!) 108   Temp 97.8 F (36.6 C) (Oral)   Resp 18   Ht 6' (1.829 m)   Wt 79.4 kg   SpO2 98%   BMI 23.73 kg/m   Physical Exam Vitals and nursing note reviewed.  Constitutional:      Appearance: He is well-developed and well-nourished.  HENT:     Head: Normocephalic and atraumatic.     Nose: No congestion or rhinorrhea.     Mouth/Throat:     Mouth: Mucous  membranes are moist.     Pharynx: Oropharynx is clear.  Eyes:     Pupils: Pupils are equal, round, and reactive to light.  Cardiovascular:     Rate and Rhythm: Normal rate.  Pulmonary:     Effort: Pulmonary effort is normal. No respiratory distress.  Abdominal:     General: Abdomen is flat. There is no distension.  Musculoskeletal:        General: No swelling, tenderness, deformity or signs of injury. Normal range of motion.     Cervical back: Normal range of motion.  Skin:    General: Skin is warm and dry.  Neurological:     General: No focal deficit present.     Mental Status: He is alert.     ED Results / Procedures / Treatments   Labs (all labs ordered are listed, but only abnormal results are displayed) Labs Reviewed - No data to display  EKG None  Radiology No results found.  Procedures Procedures   Medications Ordered in ED Medications - No data to display  ED Course  I have reviewed the triage vital signs and the nursing notes.  Pertinent labs & imaging results that were available during my care of the patient were reviewed by me and considered  in my medical decision making (see chart for details).    MDM Rules/Calculators/A&P                          Malingering to avoid arrest?   Final Clinical Impression(s) / ED Diagnoses Final diagnoses:  Malingering    Rx / DC Orders ED Discharge Orders    None       Adora Yeh, Barbara Cower, MD 05/30/20 [phone]

## 2020-05-30 NOTE — ED Notes (Signed)
Patient was sleep in lobby and did not hear [place] calling his name

## 2020-05-30 NOTE — ED Provider Notes (Signed)
[facility]-EMERGENCY DEPT Provider Note   CSN: [id number] Arrival date & time: 05/30/20  0326     History Chief Complaint  Patient presents with  . Medication Refill    Alejandro Baker is a 45 y.o. male.  HPI   Patient was initially seen early this morning at about 3 AM.  Patient apparently was trespassing and [employer] was called.  He then told them that he needed medical evaluation because of severe bilateral foot pain.  Patient was walking without any difficulty.  Patient indicated he had been having foot pain for years.  Patient apparently had been seen before for a similar episode.  Patient was evaluated and released.  Patient was discharged from the emergency room.  He then asked to check back in to get a medication refill after being discharged.  Patient slept in the emergency room waiting room waiting to be seen.  Patient states he needs a refill of his Metformin, may be some Tylenol and some type of mental health medication.  Patient cannot tell me what the mental health medication is.  He does not remember the last time he took Metformin.  Patient denies any other concerns at this time  History reviewed. No pertinent past medical history.  There are no problems to display for this patient.   No past surgical history on file.     No family history on file.  Social History   Tobacco Use  . Smoking status: Current Every Day Smoker    Packs/day: 1.00    Years: 15.00    Pack years: 15.00  . Smokeless tobacco: Never Used  Substance Use Topics  . Alcohol use: Not Currently  . Drug use: Not Currently    Home Medications Prior to Admission medications   Medication Sig Start Date End Date Taking? Authorizing Provider  acetaminophen (TYLENOL) 325 MG tablet Take 2 tablets (650 mg total) by mouth every 6 (six) hours as needed for mild pain, moderate pain or headache. 05/30/20  Yes Linwood Dibbles, MD  metFORMIN (GLUCOPHAGE) 500 MG tablet Take 1 tablet (500 mg  total) by mouth 2 (two) times daily with a meal. 05/30/20 06/29/20  Linwood Dibbles, MD    Allergies    Penicillins and Povidone-iodine  Review of Systems   Review of Systems  Constitutional: Negative for fever.  Respiratory: Negative for shortness of breath.   Cardiovascular: Negative for chest pain.  Gastrointestinal: Negative for abdominal pain.    Physical Exam Updated Vital Signs BP (!) 140/97 (BP Location: Right Arm)   Pulse 97   Temp (!) 97.5 F (36.4 C) (Oral)   Resp 18   Ht 1.829 m (6')   Wt 79.4 kg   SpO2 100%   BMI 23.73 kg/m   Physical Exam Vitals and nursing note reviewed.  Constitutional:      General: He is not in acute distress.    Appearance: He is well-developed.  HENT:     Head: Normocephalic and atraumatic.     Right Ear: External ear normal.     Left Ear: External ear normal.  Eyes:     General: No scleral icterus.       Right eye: No discharge.        Left eye: No discharge.     Conjunctiva/sclera: Conjunctivae normal.  Neck:     Trachea: No tracheal deviation.  Cardiovascular:     Rate and Rhythm: Normal rate and regular rhythm.     Heart sounds: Normal heart  sounds.  Pulmonary:     Effort: Pulmonary effort is normal. No respiratory distress.     Breath sounds: Normal breath sounds. No stridor. No wheezing.  Abdominal:     General: There is no distension.  Musculoskeletal:        General: No swelling or deformity.     Cervical back: Neck supple.  Skin:    General: Skin is warm and dry.     Findings: No rash.  Neurological:     Mental Status: He is alert.     Cranial Nerves: Cranial nerve deficit: no gross deficits.     ED Results / Procedures / Treatments   Labs (all labs ordered are listed, but only abnormal results are displayed) Labs Reviewed - No data to display  EKG None  Radiology No results found.  Procedures Procedures   Medications Ordered in ED Medications - No data to display  ED Course  I have reviewed the  triage vital signs and the nursing notes.  Pertinent labs & imaging results that were available during my care of the patient were reviewed by me and considered in my medical decision making (see chart for details).    MDM Rules/Calculators/A&P                         Patient has history of homelessness.  It is very cold outside.  Patient was seen in the emergency room earlier this morning as well as yesterday.  Apparently enforcement offered to take the patient to the homeless shelter initially but he did not want to go there.  Patient request medication refill right now.  I am happy to refill his Metformin.  I do not see any other indication what other medications he has been on.  I suspect his main issue is the cold temperatures outside which is understandable.  No acute medical issues.  Patient is stable for discharge  Final Clinical Impression(s) / ED Diagnoses Final diagnoses:  Medication refill    Rx / DC Orders ED Discharge Orders         Ordered    metFORMIN (GLUCOPHAGE) 500 MG tablet  2 times daily with meals        05/30/20 0837    acetaminophen (TYLENOL) 325 MG tablet  Every 6 hours PRN        05/30/20 0842           Linwood Dibbles, MD 05/30/20 [phone]

## 2020-06-02 ENCOUNTER — Emergency Department (HOSPITAL_COMMUNITY)
Admission: EM | Admit: 2020-06-02 | Discharge: 2020-06-02 | Disposition: A | Discharge status: Home / Self Care | Payer: Medicare Other | Attending: Emergency Medicine | Admitting: Emergency Medicine

## 2020-06-02 ENCOUNTER — Encounter (HOSPITAL_COMMUNITY): Payer: Self-pay

## 2020-06-02 ENCOUNTER — Emergency Department (HOSPITAL_COMMUNITY)
Admission: EM | Admit: 2020-06-02 | Discharge: 2020-06-02 | Discharge status: Against Medical Advice | Payer: Medicare Other

## 2020-06-02 DIAGNOSIS — Z5900 Homelessness unspecified: Secondary | ICD-10-CM | POA: Insufficient documentation

## 2020-06-02 DIAGNOSIS — Z7984 Long term (current) use of oral hypoglycemic drugs: Secondary | ICD-10-CM | POA: Diagnosis not present

## 2020-06-02 DIAGNOSIS — R739 Hyperglycemia, unspecified: Secondary | ICD-10-CM

## 2020-06-02 DIAGNOSIS — I10 Essential (primary) hypertension: Secondary | ICD-10-CM | POA: Insufficient documentation

## 2020-06-02 DIAGNOSIS — E1165 Type 2 diabetes mellitus with hyperglycemia: Secondary | ICD-10-CM | POA: Diagnosis not present

## 2020-06-02 DIAGNOSIS — F17200 Nicotine dependence, unspecified, uncomplicated: Secondary | ICD-10-CM | POA: Insufficient documentation

## 2020-06-02 HISTORY — DX: Type 2 diabetes mellitus without complications: E11.9

## 2020-06-02 HISTORY — DX: Essential (primary) hypertension: I10

## 2020-06-02 LAB — CBG MONITORING, ED: Glucose-Capillary: 362 mg/dL — ABNORMAL HIGH (ref 70–99)

## 2020-06-02 MED ORDER — METFORMIN HCL 500 MG PO TABS: 500.0000 mg | ORAL_TABLET | Freq: Two times a day (BID) | ORAL | 0 refills | Status: DC

## 2020-06-02 MED ORDER — METFORMIN HCL 500 MG PO TABS
500.0000 mg | ORAL_TABLET | Freq: Once | ORAL | Status: AC
  Administered 2020-06-02: 500 mg via ORAL
  Filled 2020-06-02: qty 1

## 2020-06-02 NOTE — ED Notes (Signed)
Patient given home medication and provided with warm clothes. Discharged per MD. Security at beside due to patient behavior.

## 2020-06-02 NOTE — ED Triage Notes (Signed)
Pt is homeless and states that he needs new pants because they're wet and a shirt Pt is also mumbling to himself EMS reports that he was hyperglycemic but is also non compliant with meds, they also state that he's a little hyertensive

## 2020-06-02 NOTE — ED Notes (Signed)
Patient becoming verbally and physically aggressive towards EMS and writer-informed patient patient he could leave and escorted him out EMS bay

## 2020-06-02 NOTE — ED Provider Notes (Signed)
[facility]-EMERGENCY DEPT Provider Note   CSN: [id number] Arrival date & time: 06/02/20  0046     History Chief Complaint  Patient presents with  . Hyperglycemia    Alejandro Baker is a 45 y.o. male.  The history is provided by the patient.  Hyperglycemia Blood sugar level PTA:  Unknown Severity:  Moderate Onset quality:  Gradual Timing:  Constant Progression:  Unchanged Chronicity:  Chronic Current diabetic treatments: not taking his medicine for many months. Current diabetic therapy:  Metformin Context: noncompliance   Relieved by:  Nothing Ineffective treatments:  None tried Associated symptoms: no abdominal pain, no altered mental status, no chest pain, no confusion, no diaphoresis, no dizziness, no fever, no polyuria, no shortness of breath and no syncope   Risk factors: no obesity   Also here because he soiled his pants and they are wet.  He is asymptomatic from both.  He does not want to speak with EDP anymore at present.       Past Medical History:  Diagnosis Date  . Diabetes mellitus without complication (HCC)   . Hypertension     There are no problems to display for this patient.   History reviewed. No pertinent surgical history.     History reviewed. No pertinent family history.  Social History   Tobacco Use  . Smoking status: Current Every Day Smoker    Packs/day: 1.00    Years: 15.00    Pack years: 15.00  . Smokeless tobacco: Never Used  Substance Use Topics  . Alcohol use: Not Currently  . Drug use: Not Currently    Home Medications Prior to Admission medications   Medication Sig Start Date End Date Taking? Authorizing Provider  acetaminophen (TYLENOL) 325 MG tablet Take 2 tablets (650 mg total) by mouth every 6 (six) hours as needed for mild pain, moderate pain or headache. 05/30/20   Linwood Dibbles, MD  metFORMIN (GLUCOPHAGE) 500 MG tablet Take 1 tablet (500 mg total) by mouth 2 (two) times daily with a meal. 05/30/20  06/29/20  Linwood Dibbles, MD    Allergies    Penicillins and Povidone-iodine  Review of Systems   Review of Systems  Constitutional: Negative for diaphoresis and fever.  HENT: Negative for congestion.   Eyes: Negative for visual disturbance.  Respiratory: Negative for shortness of breath.   Cardiovascular: Negative for chest pain and syncope.  Gastrointestinal: Negative for abdominal pain.  Endocrine: Negative for polyuria.  Genitourinary: Negative for difficulty urinating.  Musculoskeletal: Negative for arthralgias.  Skin: Negative for rash.  Neurological: Negative for dizziness.  Psychiatric/Behavioral: Negative for confusion.  All other systems reviewed and are negative.   Physical Exam Updated Vital Signs BP (!) 165/116 (BP Location: Left Arm)   Pulse 99   Temp 99.1 F (37.3 C) (Oral)   Resp 16   SpO2 98%   Physical Exam Vitals and nursing note reviewed.  Constitutional:      General: He is not in acute distress.    Appearance: Normal appearance. He is not diaphoretic.  HENT:     Head: Normocephalic and atraumatic.     Nose: Nose normal.  Eyes:     Conjunctiva/sclera: Conjunctivae normal.     Pupils: Pupils are equal, round, and reactive to light.  Cardiovascular:     Rate and Rhythm: Normal rate and regular rhythm.     Pulses: Normal pulses.     Heart sounds: Normal heart sounds.  Pulmonary:     Effort: Pulmonary effort  is normal.     Breath sounds: Normal breath sounds.  Abdominal:     General: Abdomen is flat. Bowel sounds are normal.     Palpations: Abdomen is soft.     Tenderness: There is no abdominal tenderness. There is no guarding.  Musculoskeletal:        General: Normal range of motion.     Cervical back: Normal range of motion and neck supple.  Skin:    General: Skin is warm and dry.     Capillary Refill: Capillary refill takes less than 2 seconds.  Neurological:     Mental Status: He is alert.     Deep Tendon Reflexes: Reflexes normal.   Psychiatric:        Mood and Affect: Affect is angry.     ED Results / Procedures / Treatments   Labs (all labs ordered are listed, but only abnormal results are displayed) Labs Reviewed  CBG MONITORING, ED - Abnormal; Notable for the following components:      Result Value   Glucose-Capillary 362 (*)    All other components within normal limits    EKG None  Radiology No results found.  Procedures Procedures   Medications Ordered in ED Medications  metFORMIN (GLUCOPHAGE) tablet 500 mg (has no administration in time range)    ED Course  I have reviewed the triage vital signs and the nursing notes.  Pertinent labs & imaging results that were available during my care of the patient were reviewed by me and considered in my medical decision making (see chart for details).    Patient is verbally abusive to the tech walking him back from triage calling her "fat, black and lazy."  EDP has informed the patient this will not be tolerated.  Patient is dismissing the ED as he wants to sleep.  There is no acute emergency issue at this time.  EDP has seen and appreciates nurses note but based on the JNC7 there is no indication for treatment of this blood pressure.  Glucose is consistent with baseline and patient is noncompliant.  There is no SI or HI.  Patient is mumbling because he wants to go to sleep and to sleep in the department.  Provided a dose of home medication and dry pants. There is no acute psychiatric illness at this time.  Patient is stable for discharge with close follow up.    Alejandro Baker was evaluated in Emergency Department on 06/02/2020 for the symptoms described in the history of present illness. He was evaluated in the context of the global COVID-19 pandemic, which necessitated consideration that the patient might be at risk for infection with the SARS-CoV-2 virus that causes COVID-19. Institutional protocols and algorithms that pertain to the evaluation of patients at  risk for COVID-19 are in a state of rapid change based on information released by regulatory bodies including the CDC and federal and state organizations. These policies and algorithms were followed during the patient's care in the ED.  Final Clinical Impression(s) / ED Diagnoses Final diagnoses:  Hyperglycemia  Homelessness   Return for intractable cough, coughing up blood, fevers >100.4 unrelieved by medication, shortness of breath, intractable vomiting, chest pain, shortness of breath, weakness, numbness, changes in speech, facial asymmetry, abdominal pain, passing out, Inability to tolerate liquids or food, cough, altered mental status or any concerns. No signs of systemic illness or infection. The patient is nontoxic-appearing on exam and vital signs are within normal limits.  I have reviewed the triage  vital signs and the nursing notes. Pertinent labs & imaging results that were available during my care of the patient were reviewed by me and considered in my medical decision making (see chart for details). After history, exam, and medical workup I feel the patient has been appropriately medically screened and is safe for discharge home. Pertinent diagnoses were discussed with the patient. Patient was given return precautions.      Delaina Fetsch, MD 06/02/20 [id number]

## 2020-06-05 ENCOUNTER — Other Ambulatory Visit: Payer: Self-pay

## 2020-06-05 ENCOUNTER — Ambulatory Visit (HOSPITAL_COMMUNITY)
Admission: EM | Admit: 2020-06-05 | Discharge: 2020-06-05 | Disposition: A | Discharge status: Home / Self Care | Payer: Medicare Other | Attending: Student | Admitting: Student

## 2020-06-05 ENCOUNTER — Encounter (HOSPITAL_COMMUNITY): Payer: Self-pay | Admitting: Emergency Medicine

## 2020-06-05 DIAGNOSIS — Z20822 Contact with and (suspected) exposure to covid-19: Secondary | ICD-10-CM | POA: Diagnosis not present

## 2020-06-05 DIAGNOSIS — F23 Brief psychotic disorder: Secondary | ICD-10-CM | POA: Insufficient documentation

## 2020-06-05 LAB — CBC WITH DIFFERENTIAL/PLATELET
Abs Immature Granulocytes: 0.03 10*3/uL (ref 0.00–0.07)
Basophils Absolute: 0.1 10*3/uL (ref 0.0–0.1)
Basophils Relative: 1 %
Eosinophils Absolute: 0.1 10*3/uL (ref 0.0–0.5)
Eosinophils Relative: 1 %
HCT: 41.1 % (ref 39.0–52.0)
Hemoglobin: 14.2 g/dL (ref 13.0–17.0)
Immature Granulocytes: 0 %
Lymphocytes Relative: 35 %
Lymphs Abs: 3.2 10*3/uL (ref 0.7–4.0)
MCH: 30.4 pg (ref 26.0–34.0)
MCHC: 34.5 g/dL (ref 30.0–36.0)
MCV: 88 fL (ref 80.0–100.0)
Monocytes Absolute: 0.6 10*3/uL (ref 0.1–1.0)
Monocytes Relative: 7 %
Neutro Abs: 5.1 10*3/uL (ref 1.7–7.7)
Neutrophils Relative %: 56 %
Platelets: 326 10*3/uL (ref 150–400)
RBC: 4.67 MIL/uL (ref 4.22–5.81)
RDW: 12.9 % (ref 11.5–15.5)
WBC: 9.1 10*3/uL (ref 4.0–10.5)
nRBC: 0 % (ref 0.0–0.2)

## 2020-06-05 LAB — TSH: TSH: 0.815 u[IU]/mL (ref 0.350–4.500)

## 2020-06-05 LAB — COMPREHENSIVE METABOLIC PANEL
ALT: 25 U/L (ref 0–44)
AST: 21 U/L (ref 15–41)
Albumin: 3.9 g/dL (ref 3.5–5.0)
Alkaline Phosphatase: 62 U/L (ref 38–126)
Anion gap: 13 (ref 5–15)
BUN: 12 mg/dL (ref 6–20)
CO2: 23 mmol/L (ref 22–32)
Calcium: 9.5 mg/dL (ref 8.9–10.3)
Chloride: 102 mmol/L (ref 98–111)
Creatinine, Ser: 0.8 mg/dL (ref 0.61–1.24)
GFR, Estimated: >60 mL/min (ref >60)
Glucose, Bld: 332 mg/dL — ABNORMAL HIGH (ref 70–99)
Potassium: 3.8 mmol/L (ref 3.5–5.1)
Sodium: 138 mmol/L (ref 135–145)
Total Bilirubin: 0.5 mg/dL (ref 0.3–1.2)
Total Protein: 6.8 g/dL (ref 6.5–8.1)

## 2020-06-05 LAB — ETHANOL: Alcohol, Ethyl (B): <10 mg/dL (ref <10)

## 2020-06-05 LAB — RESP PANEL BY RT-PCR (FLU A&B, COVID) ARPGX2
Influenza A by PCR: NEGATIVE
Influenza B by PCR: NEGATIVE
SARS Coronavirus 2 by RT PCR: NEGATIVE

## 2020-06-05 LAB — LIPID PANEL
Cholesterol: 140 mg/dL (ref 0–200)
HDL: 56 mg/dL (ref >40)
LDL Cholesterol: 61 mg/dL (ref 0–99)
Total CHOL/HDL Ratio: 2.5 RATIO
Triglycerides: 115 mg/dL (ref <150)
VLDL: 23 mg/dL (ref 0–40)

## 2020-06-05 LAB — POC SARS CORONAVIRUS 2 AG -  ED: SARS Coronavirus 2 Ag: NEGATIVE

## 2020-06-05 LAB — POC SARS CORONAVIRUS 2 AG: SARS Coronavirus 2 Ag: NEGATIVE

## 2020-06-05 LAB — HEMOGLOBIN A1C
Hgb A1c MFr Bld: 10.7 % — ABNORMAL HIGH (ref 4.8–5.6)
Mean Plasma Glucose: 260.39 mg/dL

## 2020-06-05 MED ORDER — ACETAMINOPHEN 325 MG PO TABS: 650.0000 mg | ORAL_TABLET | Freq: Four times a day (QID) | ORAL | Status: DC | PRN

## 2020-06-05 MED ORDER — MAGNESIUM HYDROXIDE 400 MG/5ML PO SUSP: 30.0000 mL | Freq: Every day | ORAL | Status: DC | PRN

## 2020-06-05 MED ORDER — ALUM & MAG HYDROXIDE-SIMETH 200-200-20 MG/5ML PO SUSP: 30.0000 mL | ORAL | Status: DC | PRN

## 2020-06-05 NOTE — ED Notes (Signed)
Lunch given: roast beef sandwich and chips

## 2020-06-05 NOTE — ED Notes (Signed)
Patient A&O x 4, ambulatory. Patient discharged in no acute distress. Patient denied SI/HI, A/VH upon discharge. Patient verbalized understanding of all discharge instructions explained by staff, to include follow up appointments and safety plan. Pt belongings returned to patient from locker intact. Patient escorted to lobby via staff for self transport with bus passes to destination. Safety maintained.

## 2020-06-05 NOTE — ED Notes (Signed)
Pt presents with smell of ETOH and reports he drank wine coolers last night.  Pt responding to internal stimuli,  Using hands as a gun to shoot in the air.  Pt sleepy and difficult to arouse for lab tests.  Skin  Search completed, monitoring for safety.

## 2020-06-05 NOTE — ED Notes (Signed)
Pt sleeping in no acute distress. RR even and unlabored. Safety maintained. 

## 2020-06-05 NOTE — Discharge Instructions (Addendum)
Take all medications as prescribed. Keep all follow-up appointments as scheduled.  Do not consume alcohol or use illegal drugs while on prescription medications. Report any adverse effects from your medications to your primary care provider promptly.  In the event of recurrent symptoms or worsening symptoms, call 911, a crisis hotline, or go to the nearest emergency department for evaluation.   

## 2020-06-05 NOTE — ED Notes (Signed)
Pt sexually fondling himself and asking staff for lotion. Pt redirected.

## 2020-06-05 NOTE — ED Triage Notes (Signed)
Pt reports he drank wine coolers tonight.  Denies SI, HI or AVH.

## 2020-06-05 NOTE — ED Notes (Signed)
PATIENT BELONGINGS ARE STORED IN LOCKER 28 

## 2020-06-05 NOTE — BH Assessment (Signed)
Comprehensive Clinical Assessment (CCA) Note  06/05/2020 Alejandro Baker [id number]  Chief Complaint:  Chief Complaint  Patient presents with  . 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 Clinician asked " what brought you in", Pt was unable to answer series of questions.   Pt gave TTS Counselor permission to gather collateral information, from his wife Alejandro Baker, the telephone number he provided was incorrect.  Pt presents the following symptoms: unmanageable, memory deficits, delusions and visual hallucination, and disorientation.  Pt acknowledged that he was tired and anxious.  Pt reported that he had received two hours of sleep.  Pt reported that he was hungry and need food.  Pt denied Suicidal ideation.  Pt denies prior suicide attempt.  Pt denies any history of intentional self injurious behaviors.  Pt denies current homicidal ideation or history of violence.  During the consult, pt demonstrate both visual and auditory hallucinations, utilizing hand motions, figuring in the air, hand movents and talking to himself.  Pt denies experience of paranoia .  Pt says he had been drinking alcohol, unable to clarified specific frequency and duration.  Pt admitted to smoking marijuana occassions.     Pt identifies his primary stressor as being anxious and wanting to leave the hospital.  Pt repots that he lives with his wife Alejandro Baker.  Pt denies family history of mental illness and substance use.  Pt dens any history of abuse or trauma.  Pt denies any current legal problems.  Pt says he is currently not receiving weekly outpatient therapy; also is not receiving outpatient medication management.  Pt is dressed in casual attire, alert, oriented x 2 with slurred speech and restless motor  behavior.,  Eye contact is fleeting and pt mood hypomania.  Thought process is delusion.  Pt's insight is flesh of insight and judgment is impaired.  Pt is currently responding to internal stimuli or experiencing delusional thought content.  Pt was guarded and suspusion throughout assessment.    Disposition: Melbourne Abts PA-D, recommends overnight observation and to be reassessed by psychiatry.  Disposition discussed with Ecologist, via secure chat in Epic.   CCA Screening, Triage and Referral (STR)  Patient Reported Information How did you hear about [place]? Legal System  Referral name: No data recorded Referral phone number: No data recorded  Whom do you see for routine medical problems? I don't have a doctor  Practice/Facility Name: No data recorded Practice/Facility Phone Number: No data recorded Name of Contact: No data recorded Contact Number: No data recorded Contact Fax Number: No data recorded Prescriber Name: No data recorded Prescriber Address (if known): No data recorded  What Is the Reason for Your Visit/Call Today? psychotic disorder  How Long Has This Been Causing You Problems? <Week  What Do You Feel Would Help You the Most Today? -- (UTA)   Have You Recently Been in Any Inpatient Treatment (Hospital/Detox/Crisis Center/28-Day Program)? -- (UTA)  Name/Location of Program/Hospital:No data recorded How Long Were You There? No data recorded When Were You Discharged? No data recorded  Have You Ever Received Services From [facility] Before? No data recorded Who Do You See at [facility]? No data recorded  Have You Recently Had Any Thoughts About Hurting Yourself? No  Are You Planning to Commit Suicide/Harm Yourself At This time? -- (UTA)  Have you Recently Had Thoughts About Hurting Someone [name]? No  Explanation: No data recorded  Have You Used Any Alcohol or Drugs in the Past 24 Hours? Yes  How Long Ago Did You Use Drugs or Alcohol? 2300  What Did You  Use and How Much? UTA   Do You Currently Have a Therapist/Psychiatrist? No  Name of Therapist/Psychiatrist: No data recorded  Have You Been Recently Discharged From Any Office Practice or Programs? No  Explanation of Discharge From Practice/Program: No data recorded    CCA Screening Triage Referral Assessment Type of Contact: Face-to-Face  Is this Initial or Reassessment? No data recorded Date Telepsych consult ordered in [facility]:  No data recorded Time Telepsych consult ordered in [facility]:  No data recorded  Patient Reported Information Reviewed? Yes  Patient Left Without Being Seen? No data recorded Reason for Not Completing Assessment: No data recorded  Collateral Involvement: no collateral involved   Does Patient Have a Court Appointed Legal Guardian? No data recorded Name and Contact of Legal Guardian: No data recorded If Minor and Not Living with Parent(s), Who has Custody? UTA  Is CPS involved or ever been involved? Never  Is APS involved or ever been involved? -- (UTA)   Patient Determined To Be At Risk for Harm To Self or Others Based on Review of Patient Reported Information or Presenting Complaint? No (Pt denies SI and HI)  Method: No data recorded Availability of Means: No data recorded Intent: No data recorded Notification Required: No data recorded Additional Information for Danger to Others Potential: No data recorded Additional Comments for Danger to Others Potential: No data recorded Are There Guns or Other Weapons in Your Home? No data recorded Types of Guns/Weapons: No data recorded Are These Weapons Safely Secured?                            No data recorded Who Could Verify You Are Able To Have These Secured: No data recorded Do You Have any Outstanding Charges, Pending Court Dates, Parole/Probation? No data recorded Contacted To Inform of Risk of Harm To Self or Others: -- ([employer] brought him in to urgent care.)   Location of Assessment: [facility]  Assessment Services   Does Patient Present under Involuntary Commitment? No  IVC Papers Initial File Date: No data recorded  [place] of Residence: [place]   Patient Currently Receiving the Following Services: Not Receiving Services   Determination of Need: No data recorded  Options For Referral: -- (UTA)     CCA Biopsychosocial Intake/Chief Complaint:  psychotic disorder  Current Symptoms/Problems: unmanageable, memory deflicts, visual hallucinations, poor judgement   Patient Reported Schizophrenia/Schizoaffective Diagnosis in Past: No   Strengths: UTA  Preferences: UTA  Abilities: UTA   Type of Services Patient Feels are Needed: UTA   Initial Clinical Notes/Concerns: UTA   Mental Health Symptoms Depression:  Difficulty Concentrating   Duration of Depressive symptoms: Less than two weeks   Mania:  Irritability; Recklessness   Anxiety:   No data recorded  Psychosis:  Grossly disorganized or catatonic behavior; Grossly disorganized speech; Hallucinations   Duration of Psychotic symptoms: No data recorded  Trauma:  None   Obsessions:  None   Compulsions:  None   Inattention:  Disorganized; Does not seem to listen; Fails to pay attention/makes careless mistakes   Hyperactivity/Impulsivity:  Feeling of restlessness   Oppositional/Defiant Behaviors:  None   Emotional Irregularity:  Unstable self-image   Other Mood/Personality  Symptoms:  No data recorded   Mental Status Exam Appearance and self-care  Stature:  Average   Weight:  Average weight   Clothing:  Disheveled   Grooming:  Neglected   Cosmetic use:  None   Posture/gait:  Normal   Motor activity:  Repetitive   Sensorium  Attention:  Distractible; Inattentive; Confused   Concentration:  Scattered   Orientation:  Person; Object   Recall/memory:  Defective in Immediate   Affect and Mood  Affect:  Inappropriate   Mood:  Hypomania   Relating  Eye contact:  Fleeting    Facial expression:  Tense   Attitude toward examiner:  Suspicious   Thought and Language  Speech flow: Slurred   Thought content:  Delusions   Preoccupation:  -- (UTA)   Hallucinations:  Visual; Auditory   Organization:  No data recorded  [employer] of Knowledge:  -- (UTA)   Intelligence:  Needs investigation   Abstraction:  -- (UTA)   Judgement:  Impaired   Reality Testing:  Unaware   Insight:  Flashes of insight   Decision Making:  Confused   Social Functioning  Social Maturity:  Self-centered   Social Judgement:  Impropriety (UTA)   Stress  Stressors:  Relationship   Coping Ability:  Deficient supports   Skill Deficits:  Decision making; Intellect/education; Interpersonal; Self-care; Self-control   Supports:  Friends/Service system     Religion: Religion/Spirituality Are You A Religious Person?:  (UTA) How Might This Affect Treatment?: UTA  Leisure/Recreation: Leisure / Recreation Do You Have Hobbies?:  (UTA)  Exercise/Diet: Exercise/Diet Do You Exercise?:  (UTA) Have You Gained or Lost A Significant Amount of Weight in the Past Six Months?:  (UTA) Do You Follow a Special Diet?:  (UTA) Do You Have Any Trouble Sleeping?:  (Pt reports two hours of sleep)   CCA Employment/Education Employment/Work Situation: Employment / Work Environmental consultant job has been impacted by current illness:  (UTA) What is the longest time patient has a held a job?: UTA 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 Has patient ever been in the [employer]?:  Industrial/product designer)  Education: Education Is Patient Currently Attending School?:  (UTA) Last Grade Completed:  (UTA) Name of High School: UTA Did Garment/textile technologist From [employer]?:  (UTA) Did You Attend College?:  (UTA) Did You Attend Graduate School?:  (UTA) Did You Have Any Special Interests In School?: UTA Did You Have An Individualized Education Program (IIEP):  (UTA) Did You Have Any Difficulty At  School?:  (UTA) Patient's Education Has Been Impacted by Current Illness:  (UTA)   CCA Family/Childhood History Family and Relationship History: Family history Are you sexually active?:  (UTA) What is your sexual orientation?: UTA Has your sexual activity been affected by drugs, alcohol, medication, or emotional stress?: UTA Does patient have children?:  (UTA)  Childhood History:  Childhood History By whom was/is the patient raised?:  (UTA) Additional childhood history information: UTA Description of patient's relationship with caregiver when they were a child: UTA Patient's description of current relationship with people who raised him/her: UTA How were you disciplined when you got in trouble as a child/adolescent?: UTA Does patient have siblings?:  (UTA) Did patient suffer any verbal/emotional/physical/sexual abuse as a child?:  (UTA) Did patient suffer from severe childhood neglect?:  (UTA) Has patient ever been sexually abused/assaulted/raped as an adolescent or adult?:  (UTA) Was the patient ever a victim of a crime or a disaster?:  (UTA) Witnessed domestic violence?:  (  UTA) Has patient been affected by domestic violence as an adult?:  Industrial/product designer)  Child/Adolescent Assessment:     CCA Substance Use Alcohol/Drug Use: Alcohol / Drug Use Pain Medications: See MRA Prescriptions: See MRA Over the Counter: See MRA History of alcohol / drug use?: Yes Substance #1 Name of Substance 1: Alcohol 1 - Age of First Use: UTA 1 - Amount (size/oz): UTA 1 - Frequency: UTA 1 - Duration: UTA 1 - Last Use / Amount: UTA                       ASAM's:  Six Dimensions of Multidimensional Assessment  Dimension 1:  Acute Intoxication and/or Withdrawal Potential:      Dimension 2:  Biomedical Conditions and Complications:      Dimension 3:  Emotional, Behavioral, or Cognitive Conditions and Complications:     Dimension 4:  Readiness to Change:     Dimension 5:  Relapse, Continued  use, or Continued Problem Potential:     Dimension 6:  Recovery/Living Environment:     ASAM Severity Score:    ASAM Recommended Level of Treatment:     Substance use Disorder (SUD)    Recommendations for Services/Supports/Treatments:    DSM5 Diagnoses: There are no problems to display for this patient.    Referrals to Alternative Service(s): Referred to Alternative Service(s):   Place:   Date:   Time:    Referred to Alternative Service(s):   Place:   Date:   Time:    Referred to Alternative Service(s):   Place:   Date:   Time:    Referred to Alternative Service(s):   Place:   Date:   Time:     Meryle Ready, Counselor

## 2020-06-05 NOTE — ED Notes (Signed)
Pt denies SI/HI/AVH. Report from [facility] that pt is inappropriately touching his private area and requesting lotion from staff. Pt redirected. Safety maintained

## 2020-06-05 NOTE — ED Provider Notes (Signed)
FBC/OBS ASAP Discharge Summary  Date and Time: 06/05/2020 12:04 PM  Name: Alejandro Baker  MRN:  [id number]   Discharge Diagnoses:  Final diagnoses:  Acute psychosis [facility])    Evaluation: Alejandro Baker was seen and evaluated resting in bed. Denying suicidal homicidal ideations. Denies auditory or visual hallucinations. Patient is requesting food to eat. States he is currently homeless and will need a bus pass. UDS not collected due to reported behavior. Denied substances abuse/use.  Declined substance abuse resources. Will place orders for peers support. Support, encouragement reassurance was provided.  Per admission assessment note: Alejandro Baker is a 45 year old male with no documented past psychiatric history who presents to the behavioral health urgent care voluntarily via law enforcement.  When patient was asked why he was brought to the behavioral health urgent care, patient states that he is "here because of a psychotic episode.  I was in a dark closet.  I am here to drink coffee, eat donuts, repuzzles, and smoke marijuana cigarettes".  Patient denies SI, HI, AVH, or delusions.  He endorses 1 past suicide attempt but states that he does not know when this occurred and cannot provide further information regarding the suicide attempt.  Patient denies any history of cutting himself, but states that he burned his toes at 1 point in the past on purpose.  Patient endorses poor sleep, sleeping about 1 to 2 hours per night.     Total Time spent with patient: 15 minutes  Past Psychiatric History:  Past Medical History: History reviewed. No pertinent past medical history. History reviewed. No pertinent surgical history. Family History: History reviewed. No pertinent family history. Family Psychiatric History:  Social History:  Social History   Substance and Sexual Activity  Alcohol Use None     Social History   Substance and Sexual Activity  Drug Use Not on file    Social History    Socioeconomic History   Marital status: Single    Spouse name: Not on file   Number of children: Not on file   Years of education: Not on file   Highest education level: Not on file  Occupational History   Not on file  Tobacco Use   Smoking status: Current Every Day Smoker   Smokeless tobacco: Current User  Substance and Sexual Activity   Alcohol use: Not on file   Drug use: Not on file   Sexual activity: Not on file  Other Topics Concern   Not on file  Social History Narrative   Not on file   Social Determinants of Health   Financial Resource Strain: Not on file  Food Insecurity: Not on file  Transportation Needs: Not on file  Physical Activity: Not on file  Stress: Not on file  Social Connections: Not on file   SDOH:  SDOH Screenings   Alcohol Screen: Not on file  Depression (PHQ2-9): Not on file  Financial Resource Strain: Not on file  Food Insecurity: Not on file  Housing: Not on file  Physical Activity: Not on file  Social Connections: Not on file  Stress: Not on file  Tobacco Use: High Risk   Smoking Tobacco Use: Current Every Day Smoker   Smokeless Tobacco Use: Current User  Transportation Needs: Not on file    Has this patient used any form of tobacco in the last 30 days? (Cigarettes, Smokeless Tobacco, Ci gars, and/or Pipes) no  Current Medications:  Current Facility-Administered Medications  Medication Dose Route Frequency Provider Last Rate Last Admin  acetaminophen (TYLENOL) tablet 650 mg  650 mg Oral Q6H PRN Jaclyn Shaggy, PA-C       alum & mag hydroxide-simeth (MAALOX/MYLANTA) 200-200-20 MG/5ML suspension 30 mL  30 mL Oral Q4H PRN Melbourne Abts W, PA-C       magnesium hydroxide (MILK OF MAGNESIA) suspension 30 mL  30 mL Oral Daily PRN Jaclyn Shaggy, PA-C       No current outpatient medications on file.    PTA Medications: (Not in a hospital admission)   Musculoskeletal  Strength & Muscle Tone: Prescription not provided  because: Marland Kitchen Gait & Station: normal Patient leans: N/A  Psychiatric Specialty Exam  Presentation  General Appearance: Bizarre; Disheveled  Eye Contact:Poor  Speech:Garbled  Speech Volume:Normal  Handedness:No data recorded  Mood and Affect  Mood:-- (Unable to be assessed)  Affect:Inappropriate   Thought Process  Thought Processes:Disorganized  Descriptions of Associations:Tangential  Orientation:None (Patient not oriented to person, place, or location.)  Thought Content:Delusions  Hallucinations:Hallucinations: -- (Patient denies)  Ideas of Reference:Delusions  Suicidal Thoughts:Suicidal Thoughts: No  Homicidal Thoughts:Homicidal Thoughts: No   Sensorium  Memory:-- (Unable to be assessed)  Judgment:Poor  Insight:Lacking   Executive Functions  Concentration:Poor  Attention Span:Poor  Recall:-- (Unable to be assessed)  Fund of Knowledge:-- (Unable to be assessed)  Language:Poor   Psychomotor Activity  Psychomotor Activity:Psychomotor Activity: -- (Patient intermittently making gestures of shooting a gun/bow and arrow throughout the encounter.)   Assets  Assets:Housing; Leisure Time; Physical Health   Sleep  Sleep:Sleep: Poor Number of Hours of Sleep: 2   Physical Exam  Physical Exam ROS Blood pressure (!) 144/87, pulse (!) 105, temperature 97.7 F (36.5 C), temperature source Temporal, resp. rate 20, SpO2 98 %. There is no height or weight on file to calculate BMI.  Demographic Factors:  Male and Low socioeconomic status  Loss Factors: Financial problems/change in socioeconomic status  Historical Factors: Family history of mental illness or substance abuse  Risk Reduction Factors:   NA  Continued Clinical Symptoms:  Alcohol/Substance Abuse/Dependencies  Cognitive Features That Contribute To Risk:  Closed-mindedness    Suicide Risk:  Minimal: No identifiable suicidal ideation.  Patients presenting with no risk factors but  with morbid ruminations; may be classified as minimal risk based on the severity of the depressive symptoms  Plan Of Care/Follow-up recommendations:  Activity:  as tolerated Diet:  heart healthy  Disposition: Take all medications as prescribed. Keep all follow-up appointments as scheduled.  Do not consume alcohol or use illegal drugs while on prescription medications. Report any adverse effects from your medications to your primary care provider promptly.  In the event of recurrent symptoms or worsening symptoms, call 911, a crisis hotline, or go to the nearest emergency department for evaluation.   Oneta Rack, NP 06/05/2020, 12:04 PM
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
## 2020-06-06 ENCOUNTER — Encounter (HOSPITAL_COMMUNITY): Payer: Self-pay

## 2020-06-12 ENCOUNTER — Other Ambulatory Visit: Payer: Self-pay

## 2020-06-12 ENCOUNTER — Emergency Department (HOSPITAL_COMMUNITY)
Admission: EM | Admit: 2020-06-12 | Discharge: 2020-06-12 | Disposition: A | Discharge status: Home / Self Care | Payer: Medicare Other | Attending: Emergency Medicine | Admitting: Emergency Medicine

## 2020-06-12 DIAGNOSIS — Z7984 Long term (current) use of oral hypoglycemic drugs: Secondary | ICD-10-CM | POA: Diagnosis not present

## 2020-06-12 DIAGNOSIS — M79605 Pain in left leg: Secondary | ICD-10-CM | POA: Insufficient documentation

## 2020-06-12 DIAGNOSIS — G8929 Other chronic pain: Secondary | ICD-10-CM | POA: Insufficient documentation

## 2020-06-12 DIAGNOSIS — E119 Type 2 diabetes mellitus without complications: Secondary | ICD-10-CM | POA: Diagnosis not present

## 2020-06-12 DIAGNOSIS — F17210 Nicotine dependence, cigarettes, uncomplicated: Secondary | ICD-10-CM | POA: Insufficient documentation

## 2020-06-12 DIAGNOSIS — M549 Dorsalgia, unspecified: Secondary | ICD-10-CM | POA: Insufficient documentation

## 2020-06-12 MED ORDER — LIDOCAINE VISCOUS HCL 2 % MT SOLN
15.0000 mL | Freq: Once | OROMUCOSAL | Status: DC
  Filled 2020-06-12: qty 15

## 2020-06-12 MED ORDER — ACETAMINOPHEN 325 MG PO TABS
650.0000 mg | ORAL_TABLET | Freq: Once | ORAL | Status: AC
  Administered 2020-06-12: 650 mg via ORAL
  Filled 2020-06-12: qty 2

## 2020-06-12 MED ORDER — ALUM & MAG HYDROXIDE-SIMETH 200-200-20 MG/5ML PO SUSP
30.0000 mL | Freq: Once | ORAL | Status: AC
  Administered 2020-06-12: 30 mL via ORAL
  Filled 2020-06-12: qty 30

## 2020-06-12 NOTE — ED Provider Notes (Signed)
[facility]-EMERGENCY DEPT Provider Note   CSN: [id number] Arrival date & time: 06/12/20  3299     History Chief Complaint  Patient presents with  . Leg Pain  . Back Pain    Alejandro Baker is a 45 y.o. male.  The history is provided by the patient. No language interpreter was used.  Leg Pain Location:  Leg Injury: no   Leg location:  L leg Pain details:    Severity:  Moderate   Onset quality:  Gradual   Timing:  Constant Relieved by:  Nothing Associated symptoms: back pain   Back Pain Associated symptoms: leg pain   Pt complains of pain in his left leg,  (Pt falls asleep during history) Pt has chronic pain with frequent Ed visits     Past Medical History:  Diagnosis Date  . Diabetes mellitus without complication (HCC)   . Hypertension     There are no problems to display for this patient.   No past surgical history on file.     No family history on file.  Social History   Tobacco Use  . Smoking status: Current Every Day Smoker    Packs/day: 1.00    Years: 15.00    Pack years: 15.00  . Smokeless tobacco: Current User  Substance Use Topics  . Alcohol use: Not Currently  . Drug use: Not Currently    Home Medications Prior to Admission medications   Medication Sig Start Date End Date Taking? Authorizing Provider  acetaminophen (TYLENOL) 325 MG tablet Take 2 tablets (650 mg total) by mouth every 6 (six) hours as needed for mild pain, moderate pain or headache. 05/30/20   Linwood Dibbles, MD  metFORMIN (GLUCOPHAGE) 500 MG tablet Take 1 tablet (500 mg total) by mouth 2 (two) times daily with a meal. 06/02/20 07/02/20  Palumbo, April, MD    Allergies    Penicillins and Povidone-iodine  Review of Systems   Review of Systems  Musculoskeletal: Positive for back pain.  All other systems reviewed and are negative.   Physical Exam Updated Vital Signs BP 122/89 (BP Location: Right Arm)   Pulse (!) 116   Temp 98.1 F (36.7 C) (Oral)   Resp  18   Ht 6' (1.829 m)   Wt 79.4 kg   SpO2 99%   BMI 23.73 kg/m   Physical Exam Vitals and nursing note reviewed.  Constitutional:      Appearance: He is well-developed and well-nourished.  HENT:     Head: Normocephalic.  Eyes:     Extraocular Movements: EOM normal.  Cardiovascular:     Rate and Rhythm: Normal rate.  Pulmonary:     Effort: Pulmonary effort is normal.  Abdominal:     General: There is no distension.  Musculoskeletal:        General: Normal range of motion.     Cervical back: Normal range of motion.  Skin:    General: Skin is warm.  Neurological:     General: No focal deficit present.     Mental Status: He is alert and oriented to person, place, and time.  Psychiatric:        Mood and Affect: Mood and affect and mood normal.     ED Results / Procedures / Treatments   Labs (all labs ordered are listed, but only abnormal results are displayed) Labs Reviewed - No data to display  EKG None  Radiology No results found.  Procedures Procedures   Medications Ordered in  ED Medications  acetaminophen (TYLENOL) tablet 650 mg (has no administration in time range)  alum & mag hydroxide-simeth (MAALOX/MYLANTA) 200-200-20 MG/5ML suspension 30 mL (has no administration in time range)    And  lidocaine (XYLOCAINE) 2 % viscous mouth solution 15 mL (has no administration in time range)    ED Course  I have reviewed the triage vital signs and the nursing notes.  Pertinent labs & imaging results that were available during my care of the patient were reviewed by me and considered in my medical decision making (see chart for details).    MDM Rules/Calculators/A&P                          MDM:  Pt given tylenol and gi cocktail   Final Clinical Impression(s) / ED Diagnoses Final diagnoses:  Other chronic pain    Rx / DC Orders ED Discharge Orders    None    An After Visit Summary was printed and given to the patient.    Osie Cheeks 06/12/20  1125    Lorre Nick, MD 06/14/20 1105

## 2020-06-12 NOTE — ED Triage Notes (Signed)
Pt c/o back and leg pain that has been ongoing

## 2020-06-12 NOTE — ED Notes (Signed)
Pt reported abd pain rather than leg and back pain to PA on  Assessment.

## 2020-06-12 NOTE — Discharge Instructions (Addendum)
Tylenol for pain

## 2020-06-16 ENCOUNTER — Emergency Department (HOSPITAL_COMMUNITY)
Admission: EM | Admit: 2020-06-16 | Discharge: 2020-06-16 | Disposition: A | Discharge status: Home / Self Care | Payer: Medicare Other | Attending: Emergency Medicine | Admitting: Emergency Medicine

## 2020-06-16 ENCOUNTER — Other Ambulatory Visit: Payer: Self-pay

## 2020-06-16 DIAGNOSIS — G629 Polyneuropathy, unspecified: Secondary | ICD-10-CM

## 2020-06-16 DIAGNOSIS — F17200 Nicotine dependence, unspecified, uncomplicated: Secondary | ICD-10-CM | POA: Insufficient documentation

## 2020-06-16 DIAGNOSIS — Z7984 Long term (current) use of oral hypoglycemic drugs: Secondary | ICD-10-CM | POA: Diagnosis not present

## 2020-06-16 DIAGNOSIS — I10 Essential (primary) hypertension: Secondary | ICD-10-CM | POA: Insufficient documentation

## 2020-06-16 DIAGNOSIS — E1140 Type 2 diabetes mellitus with diabetic neuropathy, unspecified: Secondary | ICD-10-CM | POA: Diagnosis not present

## 2020-06-16 DIAGNOSIS — R739 Hyperglycemia, unspecified: Secondary | ICD-10-CM | POA: Diagnosis present

## 2020-06-16 LAB — CBG MONITORING, ED: Glucose-Capillary: 256 mg/dL — ABNORMAL HIGH (ref 70–99)

## 2020-06-16 MED ORDER — GABAPENTIN 300 MG PO CAPS: 300.0000 mg | ORAL_CAPSULE | Freq: Two times a day (BID) | ORAL | 0 refills | Status: DC

## 2020-06-16 MED ORDER — INSULIN ASPART 100 UNIT/ML ~~LOC~~ SOLN
5.0000 [IU] | Freq: Once | SUBCUTANEOUS | Status: AC
  Administered 2020-06-16: 5 [IU] via SUBCUTANEOUS
  Filled 2020-06-16: qty 0.05

## 2020-06-16 NOTE — ED Provider Notes (Signed)
[facility]-EMERGENCY DEPT Provider Note   CSN: [id number] Arrival date & time: 06/16/20  0355     History Chief Complaint  Patient presents with  . Hyperglycemia    Alejandro Baker is a 45 y.o. male.  Patient brought to the emergency department by EMS.  Patient was reportedly loitering outside of the [facility] and staff there called 911.  Patient reported that he has diabetes headache is experiencing foot problems and he was therefore transported to the emergency department.  Patient reports burning pain in the bottoms of his feet.        Past Medical History:  Diagnosis Date  . Diabetes mellitus without complication (HCC)   . Hypertension     There are no problems to display for this patient.   No past surgical history on file.     No family history on file.  Social History   Tobacco Use  . Smoking status: Current Every Day Smoker    Packs/day: 1.00    Years: 15.00    Pack years: 15.00  . Smokeless tobacco: Current User  Substance Use Topics  . Alcohol use: Not Currently  . Drug use: Not Currently    Home Medications Prior to Admission medications   Medication Sig Start Date End Date Taking? Authorizing Provider  gabapentin (NEURONTIN) 300 MG capsule Take 1 capsule (300 mg total) by mouth 2 (two) times daily. 06/16/20  Yes Tamir Wallman, Canary Brim, MD  acetaminophen (TYLENOL) 325 MG tablet Take 2 tablets (650 mg total) by mouth every 6 (six) hours as needed for mild pain, moderate pain or headache. 05/30/20   Linwood Dibbles, MD  metFORMIN (GLUCOPHAGE) 500 MG tablet Take 1 tablet (500 mg total) by mouth 2 (two) times daily with a meal. 06/02/20 07/02/20  Palumbo, April, MD    Allergies    Penicillins and Povidone-iodine  Review of Systems   Review of Systems  Neurological: Positive for numbness.  All other systems reviewed and are negative.   Physical Exam Updated Vital Signs BP (!) 131/96 (BP Location: Right Arm)   Pulse 83   Temp 98.7  F (37.1 C) (Oral)   Resp 16   SpO2 99%   Physical Exam Vitals and nursing note reviewed.  Constitutional:      General: He is not in acute distress.    Appearance: Normal appearance. He is well-developed and well-nourished.  HENT:     Head: Normocephalic and atraumatic.     Right Ear: Hearing normal.     Left Ear: Hearing normal.     Nose: Nose normal.     Mouth/Throat:     Mouth: Oropharynx is clear and moist and mucous membranes are normal.  Eyes:     Extraocular Movements: EOM normal.     Conjunctiva/sclera: Conjunctivae normal.     Pupils: Pupils are equal, round, and reactive to light.  Cardiovascular:     Rate and Rhythm: Regular rhythm.     Heart sounds: S1 normal and S2 normal. No murmur heard. No friction rub. No gallop.   Pulmonary:     Effort: Pulmonary effort is normal. No respiratory distress.     Breath sounds: Normal breath sounds.  Chest:     Chest wall: No tenderness.  Abdominal:     General: Bowel sounds are normal.     Palpations: Abdomen is soft. There is no hepatosplenomegaly.     Tenderness: There is no abdominal tenderness. There is no guarding or rebound. Negative signs include  Murphy's sign and McBurney's sign.     Hernia: No hernia is present.  Musculoskeletal:        General: Normal range of motion.     Cervical back: Normal range of motion and neck supple.  Skin:    General: Skin is warm, dry and intact.     Findings: No rash.     Nails: There is no cyanosis.     Comments: Callus formation on the bottoms of both feet, slight peeling of callus on heel of left foot.  No erythema, no ulceration, no drainage  Neurological:     Mental Status: He is alert and oriented to person, place, and time.     GCS: GCS eye subscore is 4. GCS verbal subscore is 5. GCS motor subscore is 6.     Cranial Nerves: No cranial nerve deficit.     Sensory: No sensory deficit.     Coordination: Coordination normal.     Deep Tendon Reflexes: Strength normal.   Psychiatric:        Attention and Perception: He is inattentive.        Mood and Affect: Mood and affect normal. Affect is flat.        Speech: Speech is delayed.        Behavior: Behavior is slowed and withdrawn.     ED Results / Procedures / Treatments   Labs (all labs ordered are listed, but only abnormal results are displayed) Labs Reviewed  CBG MONITORING, ED - Abnormal; Notable for the following components:      Result Value   Glucose-Capillary 256 (*)    All other components within normal limits    EKG None  Radiology No results found.  Procedures Procedures   Medications Ordered in ED Medications  insulin aspart (novoLOG) injection 5 Units (has no administration in time range)    ED Course  I have reviewed the triage vital signs and the nursing notes.  Pertinent labs & imaging results that were available during my care of the patient were reviewed by me and considered in my medical decision making (see chart for details).    MDM Rules/Calculators/A&P                          Patient has had multiple recent visits with various complaints.  He was brought in after he was removed from a local business and essentially said he wanted to come to the ER.  Upon arrival he reports that he would like some tests for his diabetes.  Blood sugar is only 256, no concern for DKA.  He is complaining of burning pain and numbness of his feet.  He does have some callus formation but no sign of infection.  Symptoms consistent with neuropathy.  Final Clinical Impression(s) / ED Diagnoses Final diagnoses:  Neuropathy    Rx / DC Orders ED Discharge Orders         Ordered    gabapentin (NEURONTIN) 300 MG capsule  2 times daily        06/16/20 0602           Gilda Crease, MD 06/16/20 [phone]

## 2020-06-16 NOTE — ED Triage Notes (Signed)
Pt BIB [facility] from outside [place], called by front desk for standing outside for hours. Pt told hotel staff he is 'diabetic and has feet issues'. Pt uncooperative with EMS, went to sleep en route.  HR 98 RR 18 SpO2 100% RA CBG 281

## 2020-06-17 ENCOUNTER — Encounter (HOSPITAL_COMMUNITY): Payer: Self-pay

## 2020-06-17 ENCOUNTER — Emergency Department (HOSPITAL_COMMUNITY): Payer: Medicare Other

## 2020-06-17 ENCOUNTER — Emergency Department (HOSPITAL_COMMUNITY)
Admission: EM | Admit: 2020-06-17 | Discharge: 2020-06-17 | Disposition: A | Discharge status: Home / Self Care | Payer: Medicare Other | Attending: Emergency Medicine | Admitting: Emergency Medicine

## 2020-06-17 ENCOUNTER — Encounter (HOSPITAL_COMMUNITY): Payer: Self-pay | Admitting: Emergency Medicine

## 2020-06-17 ENCOUNTER — Emergency Department (HOSPITAL_COMMUNITY)
Admission: EM | Admit: 2020-06-17 | Discharge: 2020-06-18 | Disposition: A | Discharge status: Home / Self Care | Payer: Medicare Other | Source: Home / Self Care | Attending: Emergency Medicine | Admitting: Emergency Medicine

## 2020-06-17 ENCOUNTER — Other Ambulatory Visit: Payer: Self-pay

## 2020-06-17 DIAGNOSIS — T148XXA Other injury of unspecified body region, initial encounter: Secondary | ICD-10-CM

## 2020-06-17 DIAGNOSIS — F19129 Other psychoactive substance abuse with intoxication, unspecified: Secondary | ICD-10-CM | POA: Insufficient documentation

## 2020-06-17 DIAGNOSIS — Z23 Encounter for immunization: Secondary | ICD-10-CM | POA: Insufficient documentation

## 2020-06-17 DIAGNOSIS — Z7984 Long term (current) use of oral hypoglycemic drugs: Secondary | ICD-10-CM | POA: Insufficient documentation

## 2020-06-17 DIAGNOSIS — X58XXXA Exposure to other specified factors, initial encounter: Secondary | ICD-10-CM | POA: Insufficient documentation

## 2020-06-17 DIAGNOSIS — Z888 Allergy status to other drugs, medicaments and biological substances status: Secondary | ICD-10-CM | POA: Diagnosis not present

## 2020-06-17 DIAGNOSIS — S61411A Laceration without foreign body of right hand, initial encounter: Secondary | ICD-10-CM | POA: Insufficient documentation

## 2020-06-17 DIAGNOSIS — Z880 Allergy status to penicillin: Secondary | ICD-10-CM | POA: Diagnosis not present

## 2020-06-17 DIAGNOSIS — F17200 Nicotine dependence, unspecified, uncomplicated: Secondary | ICD-10-CM | POA: Insufficient documentation

## 2020-06-17 DIAGNOSIS — Y999 Unspecified external cause status: Secondary | ICD-10-CM | POA: Insufficient documentation

## 2020-06-17 DIAGNOSIS — Z79899 Other long term (current) drug therapy: Secondary | ICD-10-CM | POA: Diagnosis not present

## 2020-06-17 DIAGNOSIS — I10 Essential (primary) hypertension: Secondary | ICD-10-CM | POA: Diagnosis not present

## 2020-06-17 DIAGNOSIS — F17210 Nicotine dependence, cigarettes, uncomplicated: Secondary | ICD-10-CM | POA: Insufficient documentation

## 2020-06-17 DIAGNOSIS — M79672 Pain in left foot: Secondary | ICD-10-CM | POA: Diagnosis not present

## 2020-06-17 DIAGNOSIS — R000 Tachycardia, unspecified: Secondary | ICD-10-CM | POA: Insufficient documentation

## 2020-06-17 DIAGNOSIS — S41132A Puncture wound without foreign body of left upper arm, initial encounter: Secondary | ICD-10-CM | POA: Insufficient documentation

## 2020-06-17 DIAGNOSIS — F19929 Other psychoactive substance use, unspecified with intoxication, unspecified: Secondary | ICD-10-CM

## 2020-06-17 DIAGNOSIS — E119 Type 2 diabetes mellitus without complications: Secondary | ICD-10-CM | POA: Diagnosis not present

## 2020-06-17 DIAGNOSIS — S0101XA Laceration without foreign body of scalp, initial encounter: Secondary | ICD-10-CM | POA: Insufficient documentation

## 2020-06-17 DIAGNOSIS — M79671 Pain in right foot: Secondary | ICD-10-CM | POA: Diagnosis present

## 2020-06-17 LAB — CBC WITH DIFFERENTIAL/PLATELET
Abs Immature Granulocytes: 0.03 10*3/uL (ref 0.00–0.07)
Basophils Absolute: 0 10*3/uL (ref 0.0–0.1)
Basophils Relative: 1 %
Eosinophils Absolute: 0.1 10*3/uL (ref 0.0–0.5)
Eosinophils Relative: 1 %
HCT: 47.9 % (ref 39.0–52.0)
Hemoglobin: 15.6 g/dL (ref 13.0–17.0)
Immature Granulocytes: 0 %
Lymphocytes Relative: 29 %
Lymphs Abs: 2.5 10*3/uL (ref 0.7–4.0)
MCH: 29.2 pg (ref 26.0–34.0)
MCHC: 32.6 g/dL (ref 30.0–36.0)
MCV: 89.5 fL (ref 80.0–100.0)
Monocytes Absolute: 0.7 10*3/uL (ref 0.1–1.0)
Monocytes Relative: 8 %
Neutro Abs: 5.2 10*3/uL (ref 1.7–7.7)
Neutrophils Relative %: 61 %
Platelets: 330 10*3/uL (ref 150–400)
RBC: 5.35 MIL/uL (ref 4.22–5.81)
RDW: 13.6 % (ref 11.5–15.5)
WBC: 8.5 10*3/uL (ref 4.0–10.5)
nRBC: 0 % (ref 0.0–0.2)

## 2020-06-17 LAB — COMPREHENSIVE METABOLIC PANEL
ALT: 26 U/L (ref 0–44)
AST: 27 U/L (ref 15–41)
Albumin: 4.1 g/dL (ref 3.5–5.0)
Alkaline Phosphatase: 77 U/L (ref 38–126)
Anion gap: 13 (ref 5–15)
BUN: 14 mg/dL (ref 6–20)
CO2: 22 mmol/L (ref 22–32)
Calcium: 9.2 mg/dL (ref 8.9–10.3)
Chloride: 98 mmol/L (ref 98–111)
Creatinine, Ser: 1.02 mg/dL (ref 0.61–1.24)
GFR, Estimated: >60 mL/min (ref >60)
Glucose, Bld: 295 mg/dL — ABNORMAL HIGH (ref 70–99)
Potassium: 4.1 mmol/L (ref 3.5–5.1)
Sodium: 133 mmol/L — ABNORMAL LOW (ref 135–145)
Total Bilirubin: 1.5 mg/dL — ABNORMAL HIGH (ref 0.3–1.2)
Total Protein: 7.2 g/dL (ref 6.5–8.1)

## 2020-06-17 LAB — ETHANOL: Alcohol, Ethyl (B): <10 mg/dL (ref <10)

## 2020-06-17 MED ORDER — TETANUS-DIPHTH-ACELL PERTUSSIS 5-2.5-18.5 LF-MCG/0.5 IM SUSY
0.5000 mL | PREFILLED_SYRINGE | Freq: Once | INTRAMUSCULAR | Status: AC
  Administered 2020-06-17: 0.5 mL via INTRAMUSCULAR
  Filled 2020-06-17: qty 0.5

## 2020-06-17 MED ORDER — GABAPENTIN 300 MG PO CAPS: 300.0000 mg | ORAL_CAPSULE | Freq: Two times a day (BID) | ORAL | 0 refills | Status: DC
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
## 2020-06-17 NOTE — Progress Notes (Signed)
Orthopedic Tech Progress Note Patient Details:  Alejandro Baker [birth year] [id number] Level 2 Trauma  Patient ID: Alejandro Baker, male   DOB: [birth year], 45 y.o.   MRN: [id number]   Alejandro Baker 06/17/2020, 10:23 PM
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
## 2020-06-17 NOTE — Discharge Instructions (Addendum)
Please read the attachments on local shelters and on local substance use programs.  Please fill this prescription for gabapentin at any local pharmacy.  Return to ED for new or worsening symptoms.
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
## 2020-06-17 NOTE — ED Triage Notes (Signed)
Bilateral foot pain. No known cause.

## 2020-06-17 NOTE — Consult Note (Signed)
Pt unavailable ahen responded to page,  Please call again if there is further need of chaplain services.   Rev. Donnel Saxon Chaplain
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
## 2020-06-18 NOTE — Discharge Instructions (Addendum)
You were evaluated in the Emergency Department and after careful evaluation, we did not find any emergent condition requiring admission or further testing in the hospital.  Your exam/testing today was overall reassuring.  Please return to the Emergency Department if you experience any worsening of your condition.  Thank you for allowing us to be a part of your care.  

## 2020-06-20 ENCOUNTER — Encounter (HOSPITAL_COMMUNITY): Payer: Self-pay | Admitting: Emergency Medicine

## 2020-07-07 ENCOUNTER — Other Ambulatory Visit: Payer: Self-pay

## 2020-07-07 ENCOUNTER — Ambulatory Visit (HOSPITAL_COMMUNITY)
Admission: EM | Admit: 2020-07-07 | Discharge: 2020-07-08 | Disposition: A | Discharge status: Home / Self Care | Payer: Medicare Other | Attending: Psychiatry | Admitting: Psychiatry

## 2020-07-07 DIAGNOSIS — F29 Unspecified psychosis not due to a substance or known physiological condition: Secondary | ICD-10-CM | POA: Insufficient documentation

## 2020-07-07 DIAGNOSIS — Z79899 Other long term (current) drug therapy: Secondary | ICD-10-CM | POA: Insufficient documentation

## 2020-07-07 DIAGNOSIS — F19959 Other psychoactive substance use, unspecified with psychoactive substance-induced psychotic disorder, unspecified: Secondary | ICD-10-CM | POA: Insufficient documentation

## 2020-07-07 DIAGNOSIS — F1590 Other stimulant use, unspecified, uncomplicated: Secondary | ICD-10-CM | POA: Insufficient documentation

## 2020-07-07 DIAGNOSIS — F1490 Cocaine use, unspecified, uncomplicated: Secondary | ICD-10-CM | POA: Diagnosis not present

## 2020-07-07 DIAGNOSIS — F17210 Nicotine dependence, cigarettes, uncomplicated: Secondary | ICD-10-CM | POA: Diagnosis not present

## 2020-07-07 DIAGNOSIS — Z20822 Contact with and (suspected) exposure to covid-19: Secondary | ICD-10-CM | POA: Insufficient documentation

## 2020-07-07 DIAGNOSIS — F201 Disorganized schizophrenia: Secondary | ICD-10-CM | POA: Diagnosis not present

## 2020-07-07 LAB — COMPREHENSIVE METABOLIC PANEL
ALT: 24 U/L (ref 0–44)
AST: 22 U/L (ref 15–41)
Albumin: 4.1 g/dL (ref 3.5–5.0)
Alkaline Phosphatase: 83 U/L (ref 38–126)
Anion gap: 11 (ref 5–15)
BUN: 11 mg/dL (ref 6–20)
CO2: 27 mmol/L (ref 22–32)
Calcium: 9.4 mg/dL (ref 8.9–10.3)
Chloride: 95 mmol/L — ABNORMAL LOW (ref 98–111)
Creatinine, Ser: 0.69 mg/dL (ref 0.61–1.24)
GFR, Estimated: >60 mL/min (ref >60)
Glucose, Bld: 294 mg/dL — ABNORMAL HIGH (ref 70–99)
Potassium: 4 mmol/L (ref 3.5–5.1)
Sodium: 133 mmol/L — ABNORMAL LOW (ref 135–145)
Total Bilirubin: 0.6 mg/dL (ref 0.3–1.2)
Total Protein: 7 g/dL (ref 6.5–8.1)

## 2020-07-07 LAB — CBC WITH DIFFERENTIAL/PLATELET
Abs Immature Granulocytes: 0.02 10*3/uL (ref 0.00–0.07)
Basophils Absolute: 0 10*3/uL (ref 0.0–0.1)
Basophils Relative: 1 %
Eosinophils Absolute: 0.1 10*3/uL (ref 0.0–0.5)
Eosinophils Relative: 1 %
HCT: 45 % (ref 39.0–52.0)
Hemoglobin: 15.4 g/dL (ref 13.0–17.0)
Immature Granulocytes: 0 %
Lymphocytes Relative: 31 %
Lymphs Abs: 1.9 10*3/uL (ref 0.7–4.0)
MCH: 30.6 pg (ref 26.0–34.0)
MCHC: 34.2 g/dL (ref 30.0–36.0)
MCV: 89.5 fL (ref 80.0–100.0)
Monocytes Absolute: 0.6 10*3/uL (ref 0.1–1.0)
Monocytes Relative: 9 %
Neutro Abs: 3.7 10*3/uL (ref 1.7–7.7)
Neutrophils Relative %: 58 %
Platelets: 347 10*3/uL (ref 150–400)
RBC: 5.03 MIL/uL (ref 4.22–5.81)
RDW: 13.6 % (ref 11.5–15.5)
WBC: 6.3 10*3/uL (ref 4.0–10.5)
nRBC: 0 % (ref 0.0–0.2)

## 2020-07-07 LAB — LIPID PANEL
Cholesterol: 150 mg/dL (ref 0–200)
HDL: 63 mg/dL (ref >40)
LDL Cholesterol: 69 mg/dL (ref 0–99)
Total CHOL/HDL Ratio: 2.4 RATIO
Triglycerides: 92 mg/dL (ref <150)
VLDL: 18 mg/dL (ref 0–40)

## 2020-07-07 LAB — RESP PANEL BY RT-PCR (FLU A&B, COVID) ARPGX2
Influenza A by PCR: NEGATIVE
Influenza B by PCR: NEGATIVE
SARS Coronavirus 2 by RT PCR: NEGATIVE

## 2020-07-07 LAB — TSH: TSH: 0.862 u[IU]/mL (ref 0.350–4.500)

## 2020-07-07 LAB — POC SARS CORONAVIRUS 2 AG -  ED: SARS Coronavirus 2 Ag: NEGATIVE

## 2020-07-07 LAB — ETHANOL: Alcohol, Ethyl (B): <10 mg/dL (ref <10)

## 2020-07-07 MED ORDER — LOPERAMIDE HCL 2 MG PO CAPS
2.0000 mg | ORAL_CAPSULE | ORAL | Status: DC | PRN
Start: 2020-07-07 — End: 2020-07-08

## 2020-07-07 MED ORDER — HYDROXYZINE HCL 25 MG PO TABS: 25.0000 mg | ORAL_TABLET | Freq: Three times a day (TID) | ORAL | Status: DC | PRN

## 2020-07-07 MED ORDER — FOLIC ACID 1 MG PO TABS
1.0000 mg | ORAL_TABLET | Freq: Every day | ORAL | Status: DC
  Administered 2020-07-07 – 2020-07-08 (×2): 1 mg via ORAL
  Filled 2020-07-07 (×2): qty 1

## 2020-07-07 MED ORDER — ONDANSETRON 4 MG PO TBDP: 4.0000 mg | ORAL_TABLET | Freq: Four times a day (QID) | ORAL | Status: DC | PRN

## 2020-07-07 MED ORDER — MAGNESIUM HYDROXIDE 400 MG/5ML PO SUSP: 30.0000 mL | Freq: Every day | ORAL | Status: DC | PRN

## 2020-07-07 MED ORDER — ACETAMINOPHEN 325 MG PO TABS: 650.0000 mg | ORAL_TABLET | Freq: Four times a day (QID) | ORAL | Status: DC | PRN

## 2020-07-07 MED ORDER — THIAMINE HCL 100 MG PO TABS
100.0000 mg | ORAL_TABLET | Freq: Every day | ORAL | Status: DC
  Administered 2020-07-08: 100 mg via ORAL
  Filled 2020-07-07: qty 1

## 2020-07-07 MED ORDER — ALUM & MAG HYDROXIDE-SIMETH 200-200-20 MG/5ML PO SUSP: 30.0000 mL | ORAL | Status: DC | PRN

## 2020-07-07 MED ORDER — ADULT MULTIVITAMIN W/MINERALS CH
1.0000 | ORAL_TABLET | Freq: Every day | ORAL | Status: DC
  Administered 2020-07-07 – 2020-07-08 (×2): 1 via ORAL
  Filled 2020-07-07 (×2): qty 1

## 2020-07-07 MED ORDER — THIAMINE HCL 100 MG/ML IJ SOLN
100.0000 mg | Freq: Once | INTRAMUSCULAR | Status: AC
  Administered 2020-07-07: 100 mg via INTRAMUSCULAR
  Filled 2020-07-07: qty 2

## 2020-07-07 MED ORDER — LORAZEPAM 1 MG PO TABS: 1.0000 mg | ORAL_TABLET | Freq: Four times a day (QID) | ORAL | Status: DC | PRN

## 2020-07-07 NOTE — ED Notes (Signed)
Pt sleeping at present, no distress noted,  Monitoring for safety. 
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
## 2020-07-07 NOTE — ED Provider Notes (Signed)
Behavioral Health Admission H&P [facility] & OBS)  Date: 07/07/20 Patient Name: Alejandro Baker MRN: [id number] Chief Complaint:  Chief Complaint  Patient presents with  . Urgent Emergent Eval      Diagnoses:  Final diagnoses:  None    HPI: Pt is a 45 year old male with past history of HTN, DM, recent admission to [facility] for acute psychosis in Feb 2022, brought in by [employer] voluntarily to the behavioral health urgent care as Pt was appearing psychotic.  Pt is seen and examined today. When Pt was asked what brought him here  he is not able to give any definite answer and goes tangential and started taking about other things and started laughing. Pt denies suicidal ideation and homicidal ideation, auditory and visual hallucinations.  Denies any depression. Denies any substance use, and alcohol use. On Examination, Pt is awake and alert. He is not oriented to place, person and time, he appears to be responding to internal stimuli, laughing at times, taking with self and making hand gestures. He is Tangential and not answering questions. Pt refuses to answer any other questions.  Pt was last admitted at [facility] on 06/05/20 with similar episode was diagnosed with acute psychosis . Patient reported drinking alcohol at that time. [employer] confirms that Pt has a missing person report filled by his Mom. GPD gave mom's phone no for collateral information.  Mom Alejandro Baker was called @ [phone]- Mom stated that this Pt has a diagnosis of Schizophrenia and Bipolar and has been living on streets since December. She states he was in jail for few months before that. She is not sure if he is using drugs or not. She states he is on psychiatric medication but he is not complaint with that. She requested against starting  risperidone as it doesn't work for him. Talk to patient about staying here in the observation unit overnight.  Patient agrees with plan to stay here overnight.    PHQ 2-9:   Flowsheet Row ED from 06/17/2020 in  [facility] from 06/12/2020 in [facility] from 06/05/2020 in [facility]  C-SSRS RISK CATEGORY No Risk No Risk No Risk       Total Time spent with patient: 30 minutes  Musculoskeletal  Strength & Muscle Tone: within normal limits Gait & Station: normal Patient leans: N/A  Psychiatric Specialty Exam  Presentation General Appearance: Disheveled  Eye Contact:Poor  Speech:Pressured; Garbled  Speech Volume:Normal  Handedness:No data recorded  Mood and Affect  Mood:Euphoric  Affect:Inappropriate   Thought Process  Thought Processes:Disorganized  Descriptions of Associations:Tangential  Orientation:None  Thought Content:Delusions; Rumination; Tangential   Hallucinations:Hallucinations: None  Ideas of Reference:Delusions  Suicidal Thoughts:Suicidal Thoughts: No  Homicidal Thoughts:Homicidal Thoughts: No   Sensorium  Memory:Other (comment) (Cannot assess. Pt is psychotic)  Judgment:Poor  Insight:Lacking   Executive Functions  Concentration:Poor  Attention Span:Poor  Recall:Poor  Fund of Knowledge:Poor  Language:Poor   Psychomotor Activity  Psychomotor Activity:Psychomotor Activity: Increased   Assets  Assets:Social Support   Sleep  Sleep:Sleep: -- (Doesn't answer)   Nutritional Assessment (For OBS and FBC admissions only) Has the patient had a weight loss or gain of 10 pounds or more in the last 3 months?: -- (cannot assess , Pt doesn't answer) Has the patient had a decrease in food intake/or appetite?: -- (cannot assess , Pt doesn't answer) Does the patient have dental problems?: -- (cannot assess , Pt doesn't answer) Does the patient  have eating habits or behaviors that may be indicators of an eating disorder including binging or inducing vomiting?: -- (cannot assess , Pt doesn't answer) Has the patient recently lost weight without trying?:  Patient is unsure Has the patient been eating poorly because of a decreased appetite?: -- (cannot assess , Pt doesn't answer)    Physical Exam Vitals reviewed.  Constitutional:      General: He is in acute distress.     Appearance: Normal appearance. He is normal weight. He is toxic-appearing. He is not ill-appearing or diaphoretic.  HENT:     Head: Normocephalic and atraumatic.  Pulmonary:     Effort: Pulmonary effort is normal.  Neurological:     General: No focal deficit present.     Mental Status: He is alert.    ROS No able to complete RROS as pt is psychotic and not answering questions.   Blood pressure 117/87, pulse (!) 112, temperature 98.8 F (37.1 C), temperature source Oral, height  (1.803 m), SpO2 100 %. Body mass index is 23.71 kg/m.  Past Psychiatric History: Per Mom Reported history of Bipolar and Schizophrenia  Is the patient at risk to self? Yes  Has the patient been a risk to self in the past 6 months? Yes .    Has the patient been a risk to self within the distant past? No   Is the patient a risk to others? No   Has the patient been a risk to others in the past 6 months? No   Has the patient been a risk to others within the distant past? No   Past Medical History:  Past Medical History:  Diagnosis Date  . Diabetes mellitus without complication (HCC)   . Hypertension    No past surgical history on file.  Family History: No family history on file.  Social History:  Social History   Socioeconomic History  . Marital status: Single    Spouse name: Not on file  . Number of children: Not on file  . Years of education: Not on file  . Highest education level: Not on file  Occupational History  . Not on file  Tobacco Use  . Smoking status: Current Every Day Smoker    Packs/day: 1.00    Years: 15.00    Pack years: 15.00  . Smokeless tobacco: Never Used  Substance and Sexual Activity  . Alcohol use: Yes  . Drug use: Yes    Types:  Methamphetamines  . Sexual activity: Not on file  Other Topics Concern  . Not on file  Social History Narrative   ** Merged History Encounter **       ** Merged History Encounter **       ** Merged History Encounter **       Social Determinants of Corporate investment banker Strain: Not on file  Food Insecurity: Not on file  Transportation Needs: Not on file  Physical Activity: Not on file  Stress: Not on file  Social Connections: Not on file  Intimate Partner Violence: Not on file    SDOH:  SDOH Screenings   Alcohol Screen: Not on file  Depression (PHQ2-9): Not on file  Financial Resource Strain: Not on file  Food Insecurity: Not on file  Housing: Not on file  Physical Activity: Not on file  Social Connections: Not on file  Stress: Not on file  Tobacco Use: High Risk  . Smoking Tobacco Use: Current Every Day Smoker  .  Smokeless Tobacco Use: Never Used  Transportation Needs: Not on file    Last Labs:  Admission on 07/07/2020  Component Date Value Ref Range Status  . SARS Coronavirus 2 Ag 07/07/2020 Negative  Negative Final  Admission on 06/17/2020, Discharged on 06/18/2020  Component Date Value Ref Range Status  . Sodium 06/17/2020 133* 135 - 145 mmol/L Final  . Potassium 06/17/2020 4.1  3.5 - 5.1 mmol/L Final  . Chloride 06/17/2020 98  98 - 111 mmol/L Final  . CO2 06/17/2020 22  22 - 32 mmol/L Final  . Glucose, Bld 06/17/2020 295* 70 - 99 mg/dL Final   Glucose reference range applies only to samples taken after fasting for at least 8 hours.  . BUN 06/17/2020 14  6 - 20 mg/dL Final  . Creatinine, Ser 06/17/2020 1.02  0.61 - 1.24 mg/dL Final  . Calcium 16/10/960402/18/2022 9.2  8.9 - 10.3 mg/dL Final  . Total Protein 06/17/2020 7.2  6.5 - 8.1 g/dL Final  . Albumin 54/09/811902/18/2022 4.1  3.5 - 5.0 g/dL Final  . AST 14/78/295602/18/2022 27  15 - 41 U/L Final  . ALT 06/17/2020 26  0 - 44 U/L Final  . Alkaline Phosphatase 06/17/2020 77  38 - 126 U/L Final  . Total Bilirubin 06/17/2020  1.5* 0.3 - 1.2 mg/dL Final  . GFR, Estimated 06/17/2020 >60  >60 mL/min Final   Comment: (NOTE) Calculated using the CKD-EPI Creatinine Equation (2021)   . Anion gap 06/17/2020 13  5 - 15 Final   Performed at [facility], [address] [place], [place] [address]  . WBC 06/17/2020 8.5  4.0 - 10.5 K/uL Final  . RBC 06/17/2020 5.35  4.22 - 5.81 MIL/uL Final  . Hemoglobin 06/17/2020 15.6  13.0 - 17.0 g/dL Final  . HCT 65/78/469602/18/2022 47.9  39.0 - 52.0 % Final  . MCV 06/17/2020 89.5  80.0 - 100.0 fL Final  . MCH 06/17/2020 29.2  26.0 - 34.0 pg Final  . MCHC 06/17/2020 32.6  30.0 - 36.0 g/dL Final  . RDW 29/52/841302/18/2022 13.6  11.5 - 15.5 % Final  . Platelets 06/17/2020 330  150 - 400 K/uL Final  . nRBC 06/17/2020 0.0  0.0 - 0.2 % Final  . Neutrophils Relative % 06/17/2020 61  % Final  . Neutro Abs 06/17/2020 5.2  1.7 - 7.7 K/uL Final  . Lymphocytes Relative 06/17/2020 29  % Final  . Lymphs Abs 06/17/2020 2.5  0.7 - 4.0 K/uL Final  . Monocytes Relative 06/17/2020 8  % Final  . Monocytes Absolute 06/17/2020 0.7  0.1 - 1.0 K/uL Final  . Eosinophils Relative 06/17/2020 1  % Final  . Eosinophils Absolute 06/17/2020 0.1  0.0 - 0.5 K/uL Final  . Basophils Relative 06/17/2020 1  % Final  . Basophils Absolute 06/17/2020 0.0  0.0 - 0.1 K/uL Final  . Immature Granulocytes 06/17/2020 0  % Final  . Abs Immature Granulocytes 06/17/2020 0.03  0.00 - 0.07 K/uL Final   Performed at [facility], [address] [place], [place] [address]  . Alcohol, Ethyl (B) 06/17/2020 <10  <10 mg/dL Final   Comment: (NOTE) Lowest detectable limit for serum alcohol is 10 mg/dL.  For medical purposes only. Performed at [facility], [address] [place], [place] [address]   Admission on 06/16/2020, Discharged on 06/16/2020  Component Date Value Ref Range Status  . Glucose-Capillary 06/16/2020 256* 70 - 99 mg/dL Final   Glucose reference range applies  only to samples taken after fasting for at least 8  hours.  Admission on 06/05/2020, Discharged on 06/05/2020  Component Date Value Ref Range Status  . SARS Coronavirus 2 by RT PCR 06/05/2020 NEGATIVE  NEGATIVE Final   Comment: (NOTE) SARS-CoV-2 target nucleic acids are NOT DETECTED.  The SARS-CoV-2 RNA is generally detectable in upper respiratory specimens during the acute phase of infection. The lowest concentration of SARS-CoV-2 viral copies this assay can detect is 138 copies/mL. A negative result does not preclude SARS-Cov-2 infection and should not be used as the sole basis for treatment or other patient management decisions. A negative result may occur with  improper specimen collection/handling, submission of specimen other than nasopharyngeal swab, presence of viral mutation(s) within the areas targeted by this assay, and inadequate number of viral copies(<138 copies/mL). A negative result must be combined with clinical observations, patient history, and epidemiological information. The expected result is Negative.  Fact Sheet for Patients:  BloggerCourse.com  Fact Sheet for Healthcare Providers:  SeriousBroker.it  This test is no                          t yet approved or cleared by the [place] FDA and  has been authorized for detection and/or diagnosis of SARS-CoV-2 by FDA under an Emergency Use Authorization (EUA). This EUA will remain  in effect (meaning this test can be used) for the duration of the COVID-19 declaration under Section 564(b)(1) of the Act, 21 U.S.C.section 360bbb-3(b)(1), unless the authorization is terminated  or revoked sooner.      . Influenza A by PCR 06/05/2020 NEGATIVE  NEGATIVE Final  . Influenza B by PCR 06/05/2020 NEGATIVE  NEGATIVE Final   Comment: (NOTE) The Xpert Xpress SARS-CoV-2/FLU/RSV plus assay is intended as an aid in the diagnosis of influenza from Nasopharyngeal swab specimens and should not be used as a sole basis for  treatment. Nasal washings and aspirates are unacceptable for Xpert Xpress SARS-CoV-2/FLU/RSV testing.  Fact Sheet for Patients: BloggerCourse.com  Fact Sheet for Healthcare Providers: SeriousBroker.it  This test is not yet approved or cleared by the [place] FDA and has been authorized for detection and/or diagnosis of SARS-CoV-2 by FDA under an Emergency Use Authorization (EUA). This EUA will remain in effect (meaning this test can be used) for the duration of the COVID-19 declaration under Section 564(b)(1) of the Act, 21 U.S.C. section 360bbb-3(b)(1), unless the authorization is terminated or revoked.  Performed at [facility], [address] [place], [place] [address]   . SARS Coronavirus 2 Ag 06/05/2020 Negative  Negative Preliminary  . WBC 06/05/2020 9.1  4.0 - 10.5 K/uL Final  . RBC 06/05/2020 4.67  4.22 - 5.81 MIL/uL Final  . Hemoglobin 06/05/2020 14.2  13.0 - 17.0 g/dL Final  . HCT 70/04/7492 41.1  39.0 - 52.0 % Final  . MCV 06/05/2020 88.0  80.0 - 100.0 fL Final  . MCH 06/05/2020 30.4  26.0 - 34.0 pg Final  . MCHC 06/05/2020 34.5  30.0 - 36.0 g/dL Final  . RDW 49/67/5916 12.9  11.5 - 15.5 % Final  . Platelets 06/05/2020 326  150 - 400 K/uL Final  . nRBC 06/05/2020 0.0  0.0 - 0.2 % Final  . Neutrophils Relative % 06/05/2020 56  % Final  . Neutro Abs 06/05/2020 5.1  1.7 - 7.7 K/uL Final  . Lymphocytes Relative 06/05/2020 35  % Final  . Lymphs Abs 06/05/2020 3.2  0.7 -  4.0 K/uL Final  . Monocytes Relative 06/05/2020 7  % Final  . Monocytes Absolute 06/05/2020 0.6  0.1 - 1.0 K/uL Final  . Eosinophils Relative 06/05/2020 1  % Final  . Eosinophils Absolute 06/05/2020 0.1  0.0 - 0.5 K/uL Final  . Basophils Relative 06/05/2020 1  % Final  . Basophils Absolute 06/05/2020 0.1  0.0 - 0.1 K/uL Final  . Immature Granulocytes 06/05/2020 0  % Final  . Abs Immature Granulocytes 06/05/2020 0.03  0.00 - 0.07 K/uL Final    Performed at [facility], [address] [place], [place] [address]  . Sodium 06/05/2020 138  135 - 145 mmol/L Final  . Potassium 06/05/2020 3.8  3.5 - 5.1 mmol/L Final  . Chloride 06/05/2020 102  98 - 111 mmol/L Final  . CO2 06/05/2020 23  22 - 32 mmol/L Final  . Glucose, Bld 06/05/2020 332* 70 - 99 mg/dL Final   Glucose reference range applies only to samples taken after fasting for at least 8 hours.  . BUN 06/05/2020 12  6 - 20 mg/dL Final  . Creatinine, Ser 06/05/2020 0.80  0.61 - 1.24 mg/dL Final  . Calcium 60/45/4098 9.5  8.9 - 10.3 mg/dL Final  . Total Protein 06/05/2020 6.8  6.5 - 8.1 g/dL Final  . Albumin 11/91/4782 3.9  3.5 - 5.0 g/dL Final  . AST 95/62/1308 21  15 - 41 U/L Final  . ALT 06/05/2020 25  0 - 44 U/L Final  . Alkaline Phosphatase 06/05/2020 62  38 - 126 U/L Final  . Total Bilirubin 06/05/2020 0.5  0.3 - 1.2 mg/dL Final  . GFR, Estimated 06/05/2020 >60  >60 mL/min Final   Comment: (NOTE) Calculated using the CKD-EPI Creatinine Equation (2021)   . Anion gap 06/05/2020 13  5 - 15 Final   Performed at [facility], [address], [place], [place] [address]  . Hgb A1c MFr Bld 06/05/2020 10.7* 4.8 - 5.6 % Final   Comment: (NOTE) Pre diabetes:          5.7%-6.4%  Diabetes:              >6.4%  Glycemic control for   <7.0% adults with diabetes   . Mean Plasma Glucose 06/05/2020 260.39  mg/dL Final   Performed at [facility], [address], [place], [place] [address]  . Alcohol, Ethyl (B) 06/05/2020 <10  <10 mg/dL Final   Comment: (NOTE) Lowest detectable limit for serum alcohol is 10 mg/dL.  For medical purposes only. Performed at [facility], [address] [place], [place] [address]   . TSH 06/05/2020 0.815  0.350 - 4.500 uIU/mL Final   Comment: Performed by a 3rd Generation assay with a functional sensitivity of <=0.01 uIU/mL. Performed at [facility], [address] [place], [place] [address]   . SARS Coronavirus  2 Ag 06/05/2020 NEGATIVE  NEGATIVE Final   Comment: (NOTE) SARS-CoV-2 antigen NOT DETECTED.   Negative results are presumptive.  Negative results do not preclude SARS-CoV-2 infection and should not be used as the sole basis for treatment or other patient management decisions, including infection  control decisions, particularly in the presence of clinical signs and  symptoms consistent with COVID-19, or in those who have been in contact with the virus.  Negative results must be combined with clinical observations, patient history, and epidemiological information. The expected result is Negative.  Fact Sheet for Patients: https://www.jennings-kim.com/  Fact Sheet for  Healthcare Providers: https://alexander-rogers.biz/  This test is not yet approved or cleared by the [place] and  has been authorized for detection and/or diagnosis of SARS-CoV-2 by FDA under an Emergency Use Authorization (EUA).  This EUA will remain in effect (meaning this test can be used) for the duration of  the COV                          ID-19 declaration under Section 564(b)(1) of the Act, 21 U.S.C. section 360bbb-3(b)(1), unless the authorization is terminated or revoked sooner.    . Cholesterol 06/05/2020 140  0 - 200 mg/dL Final  . Triglycerides 06/05/2020 115  <150 mg/dL Final  . HDL 95/12/3265 56  >40 mg/dL Final  . Total CHOL/HDL Ratio 06/05/2020 2.5  RATIO Final  . VLDL 06/05/2020 23  0 - 40 mg/dL Final  . LDL Cholesterol 06/05/2020 61  0 - 99 mg/dL Final   Comment:        Total Cholesterol/HDL:CHD Risk Coronary Heart Disease Risk Table                     Men   Women  1/2 Average Risk   3.4   3.3  Average Risk       5.0   4.4  2 X Average Risk   9.6   7.1  3 X Average Risk  23.4   11.0        Use the calculated Patient Ratio above and the CHD Risk Table to determine the patient's CHD Risk.        ATP III CLASSIFICATION (LDL):  <100     mg/dL   Optimal   124-580  mg/dL   Near or Above                    Optimal  130-159  mg/dL   Borderline  998-338  mg/dL   High  >250     mg/dL   Very High Performed at [facility], [address] [place], [place] [address]   Admission on 06/02/2020, Discharged on 06/02/2020  Component Date Value Ref Range Status  . Glucose-Capillary 06/02/2020 362* 70 - 99 mg/dL Final   Glucose reference range applies only to samples taken after fasting for at least 8 hours.  Admission on 03/20/2020, Discharged on 03/20/2020  Component Date Value Ref Range Status  . Glucose-Capillary 03/20/2020 357* 70 - 99 mg/dL Final   Glucose reference range applies only to samples taken after fasting for at least 8 hours.  . Sodium 03/20/2020 135  135 - 145 mmol/L Final  . Potassium 03/20/2020 5.0  3.5 - 5.1 mmol/L Final  . Chloride 03/20/2020 97* 98 - 111 mmol/L Final  . CO2 03/20/2020 26  22 - 32 mmol/L Final  . Glucose, Bld 03/20/2020 414* 70 - 99 mg/dL Final   Glucose reference range applies only to samples taken after fasting for at least 8 hours.  . BUN 03/20/2020 18  6 - 20 mg/dL Final  . Creatinine, Ser 03/20/2020 1.14  0.61 - 1.24 mg/dL Final  . Calcium 73/41/9379 9.2  8.9 - 10.3 mg/dL Final  . Total Protein 03/20/2020 7.7  6.5 - 8.1 g/dL Final  . Albumin 02/40/9735 4.1  3.5 - 5.0 g/dL Final  . AST 32/99/2426 34  15 - 41 U/L Final  . ALT 03/20/2020 24  0 - 44 U/L Final  . Alkaline  Phosphatase 03/20/2020 90  38 - 126 U/L Final  . Total Bilirubin 03/20/2020 1.1  0.3 - 1.2 mg/dL Final  . GFR, Estimated 03/20/2020 >60  >60 mL/min Final   Comment: (NOTE) Calculated using the CKD-EPI Creatinine Equation (2021)   . Anion gap 03/20/2020 12  5 - 15 Final   Performed at [facility], [address] [place], [place] [address]  Admission on 03/15/2020, Discharged on 03/15/2020  Component Date Value Ref Range Status  . Glucose-Capillary 03/15/2020 280* 70 - 99 mg/dL Final   Glucose reference range  applies only to samples taken after fasting for at least 8 hours.    Allergies: Penicillins and Povidone-iodine  PTA Medications: (Not in a hospital admission)   Medical Decision Making  Pt appears psychotic likely substance induced or due to underlying psychotic illness. Admit to [facility] observation unit for overnight observation. We will reassess in the morning to decide if he meets inpatient criteria or not  Recommendations  Based on my evaluation the patient does not appear to have an emergency medical condition. Admit to [facility] for observation overnight. Pt to be reevaluated by the treatment team in the morning tomorrow and disposition to be determined at that time. -CBC with differential, CMP, TSH, lipid panel, Covid testing, urine toxicology, ethanol level. -EKG for QTC -Ativan CIWA protocol.  -Thiamin Inj 100 mg today and then PO from tomorrow.  -Folic acid 1 mg Daily. -Tylenol PRN for Pain. -Hydroxyzine 25 mg TID PRN.   Karsten Ro, MD 07/07/20  3:46 PM

## 2020-07-07 NOTE — Progress Notes (Signed)
Received Alejandro Baker from the [facility] with [employer], he had a difficult time stating why he was brought in to the Urgent Care Facility. Initially he was resistant to the preadmission testing, after informing him of the policy he consent to the blood work and covid testing. The urine test is pending. The skin assessment was completed and he showered. He received nourishments per his request.

## 2020-07-07 NOTE — ED Notes (Signed)
Pt asleep in bed. Respirations even and unlabored. Will continue to monitor for safety. ?

## 2020-07-07 NOTE — ED Notes (Signed)
Locker #[phone]  

## 2020-07-08 DIAGNOSIS — F201 Disorganized schizophrenia: Secondary | ICD-10-CM

## 2020-07-08 DIAGNOSIS — F29 Unspecified psychosis not due to a substance or known physiological condition: Secondary | ICD-10-CM | POA: Diagnosis not present

## 2020-07-08 DIAGNOSIS — F19959 Other psychoactive substance use, unspecified with psychoactive substance-induced psychotic disorder, unspecified: Secondary | ICD-10-CM | POA: Diagnosis not present

## 2020-07-08 LAB — PROLACTIN: Prolactin: 2.2 ng/mL — ABNORMAL LOW (ref 4.0–15.2)

## 2020-07-08 MED ORDER — ZIPRASIDONE MESYLATE 20 MG IM SOLR: 20.0000 mg | INTRAMUSCULAR | Status: DC | PRN

## 2020-07-08 MED ORDER — OLANZAPINE 5 MG PO TBDP: 5.0000 mg | ORAL_TABLET | Freq: Three times a day (TID) | ORAL | Status: DC | PRN

## 2020-07-08 MED ORDER — LORAZEPAM 1 MG PO TABS: 1.0000 mg | ORAL_TABLET | ORAL | Status: DC | PRN

## 2020-07-08 NOTE — ED Notes (Signed)
Pt asleep in bed. Respirations even and unlabored. Will occasionally get up to walk around unit, then go back to bed. No signs of acute distress noted. Will continue to monitor for safety.

## 2020-07-08 NOTE — Discharge Instructions (Addendum)
Please come to [facility] (this facility) during walk in hours for appointment with psychiatrist for further medication management and for therapy.   Walk in hours are 8-11 AM Monday through Thursday for medication management.It is first come, first -serve; it is best to arrive by 7:00 AM. On Friday from 1 pm to 4 pm for therapy intake only. Please arrive by 12:00 pm as it is  first come, first -serve.   When you arrive please go upstairs for your appointment. If you are unsure of where to go, inform the front desk that you are here for a walk in appointment and they will assist you with directions upstairs.  Address:  [address], in [place], [address] Ph: [phone]   

## 2020-07-08 NOTE — ED Notes (Signed)
Pt given breakfast.

## 2020-07-08 NOTE — ED Notes (Signed)
Pt yelled at staff when attempting to admin morning meds. Pt slammed the door to the bathroom when he went inside. Pt accepted morning meds after going to the bathroom. Continues to refuse to give urine sample. Safety maintained and will continue to monitor.

## 2020-07-08 NOTE — ED Provider Notes (Signed)
FBC/OBS ASAP Discharge Summary  Date and Time: 07/08/2020 12:20 PM  Name: Alejandro Baker  MRN:  [id number]   Discharge Diagnoses:  Final diagnoses:  Psychosis, unspecified psychosis type (HCC)  Disorganized schizophrenia (HCC)  Substance-induced psychotic disorder (HCC)    Subjective:  Patient interviewed this AM. He is calm, cooperative and pleasant. TP is linear and coherent, he does not appear to be RIS.Pt states he does not remember what happened to bring him into the hospital yesterday. He initially admits to drug use but then immediately recants his statement. Pt denies SI/HI/AVH. He states that he plans on going to [employer] and applying for a job. Pr requests to shower and states he would like to call his mother. Pt requesting discharge and bus passes.    Stay Summary: Pt presented on 3/10 via [employer]- On interview patient is RIS, talking to himself and making bizarre gestures. He is unable/unwilling to provide additional information surrounding the circumstances of his presentation to the [facility]. He states that [employer] brought him to the [facility] because they are "ignorant". When asked about SI, patient makes comments about picking up writer and throwing her into the street. Pt does not provide answers when asked about SI/HI. He appears actively psychotic throughout entire assessment. He denies drug use. Due to patient being acutely psychotic he was admitted for overnight observation for safety and stabilization. Pt slept for most of his admission and was not a management issue. Pt did  Not provide UDS during stay. The following day patient denied SI/HI/AVH and was stable for discharge. Patient has had 2 presentations at the [facility] now that are very similar where he has refused to provide UDS. Suspect that patient's psychosis is the result of substance use given quick resolution of symptoms after allowing for drug metabolization/washout. Pt requeste bus passes at discharge.   Total Time spent with patient:  20 minutes  Past Psychiatric History: see H&P Past Medical History:  Past Medical History:  Diagnosis Date  . Diabetes mellitus without complication (HCC)   . Hypertension    No past surgical history on file. Family History: No family history on file. Family Psychiatric History: see H&P Social History:  Social History   Substance and Sexual Activity  Alcohol Use Yes     Social History   Substance and Sexual Activity  Drug Use Yes  . Types: Methamphetamines    Social History   Socioeconomic History  . Marital status: Single    Spouse name: Not on file  . Number of children: Not on file  . Years of education: Not on file  . Highest education level: Not on file  Occupational History  . Not on file  Tobacco Use  . Smoking status: Current Every Day Smoker    Packs/day: 1.00    Years: 15.00    Pack years: 15.00  . Smokeless tobacco: Never Used  Substance and Sexual Activity  . Alcohol use: Yes  . Drug use: Yes    Types: Methamphetamines  . Sexual activity: Not on file  Other Topics Concern  . Not on file  Social History Narrative   ** Merged History Encounter **       ** Merged History Encounter **       ** Merged History Encounter **       Social Determinants of Corporate investment banker Strain: Not on file  Food Insecurity: Not on file  Transportation Needs: Not on file  Physical Activity: Not on file  Stress:  Not on file  Social Connections: Not on file   SDOH:  SDOH Screenings   Alcohol Screen: Not on file  Depression (PFX9-0): Not on file  Financial Resource Strain: Not on file  Food Insecurity: Not on file  Housing: Not on file  Physical Activity: Not on file  Social Connections: Not on file  Stress: Not on file  Tobacco Use: High Risk  . Smoking Tobacco Use: Current Every Day Smoker  . Smokeless Tobacco Use: Never Used  Transportation Needs: Not on file    Has this patient used any form of tobacco in the last 30 days? (Cigarettes,  Smokeless Tobacco, Cigars, and/or Pipes) Prescription not provided because: n/a  Current Medications:  Current Facility-Administered Medications  Medication Dose Route Frequency Provider Last Rate Last Admin  . acetaminophen (TYLENOL) tablet 650 mg  650 mg Oral Q6H PRN Karsten Ro, MD      . alum & mag hydroxide-simeth (MAALOX/MYLANTA) 200-200-20 MG/5ML suspension 30 mL  30 mL Oral Q4H PRN Karsten Ro, MD      . folic acid (FOLVITE) tablet 1 mg  1 mg Oral Daily Doda, Vandana, MD   1 mg at 07/08/20 0859  . hydrOXYzine (ATARAX/VISTARIL) tablet 25 mg  25 mg Oral TID PRN Karsten Ro, MD      . loperamide (IMODIUM) capsule 2-4 mg  2-4 mg Oral PRN Doda, Vandana, MD      . LORazepam (ATIVAN) tablet 1 mg  1 mg Oral Q6H PRN Doda, Vandana, MD      . OLANZapine zydis (ZYPREXA) disintegrating tablet 5 mg  5 mg Oral Q8H PRN Karsten Ro, MD       And  . LORazepam (ATIVAN) tablet 1 mg  1 mg Oral PRN Karsten Ro, MD       And  . ziprasidone (GEODON) injection 20 mg  20 mg Intramuscular PRN Karsten Ro, MD      . magnesium hydroxide (MILK OF MAGNESIA) suspension 30 mL  30 mL Oral Daily PRN Karsten Ro, MD      . multivitamin with minerals tablet 1 tablet  1 tablet Oral Daily Karsten Ro, MD   1 tablet at 07/08/20 0859  . ondansetron (ZOFRAN-ODT) disintegrating tablet 4 mg  4 mg Oral Q6H PRN Karsten Ro, MD      . thiamine tablet 100 mg  100 mg Oral Daily Karsten Ro, MD   100 mg at 07/08/20 0859   No current outpatient medications on file.    PTA Medications: (Not in a hospital admission)   Musculoskeletal  Strength & Muscle Tone: within normal limits Gait & Station: normal Patient leans: N/A  Psychiatric Specialty Exam  Presentation  General Appearance: Appropriate for Environment; Casual; Disheveled  Eye Contact:Fair  Speech:Clear and Coherent; Normal Rate  Speech Volume:Normal  Handedness:No data recorded  Mood and Affect  Mood:Euthymic  Affect:Appropriate;  Congruent; Constricted   Thought Process  Thought Processes:Coherent; Goal Directed; Linear  Descriptions of Associations:Intact  Orientation:Full (Time, Place and Person)  Thought Content:WDL  Hallucinations:Hallucinations: None  Ideas of Reference:None  Suicidal Thoughts:Suicidal Thoughts: No  Homicidal Thoughts:Homicidal Thoughts: No   Sensorium  Memory:Immediate Good; Recent Fair; Remote Fair  Judgment:Fair  Insight:Fair   Executive Functions  Concentration:Fair  Attention Span:Fair  Recall:Fair  Fund of Knowledge:Fair  Language:Fair   Psychomotor Activity  Psychomotor Activity:Psychomotor Activity: Normal   Assets  Assets:Communication Skills; Desire for Improvement; Resilience   Sleep  Sleep:Sleep: Fair   Nutritional Assessment (For OBS and FBC admissions only) Has  the patient had a weight loss or gain of 10 pounds or more in the last 3 months?: -- (cannot assess , Pt doesn't answer) Has the patient had a decrease in food intake/or appetite?: -- (cannot assess , Pt doesn't answer) Does the patient have dental problems?: -- (cannot assess , Pt doesn't answer) Does the patient have eating habits or behaviors that may be indicators of an eating disorder including binging or inducing vomiting?: -- (cannot assess , Pt doesn't answer) Has the patient recently lost weight without trying?: Patient is unsure Has the patient been eating poorly because of a decreased appetite?: -- (cannot assess , Pt doesn't answer)    Physical Exam  Physical Exam Constitutional:      Appearance: Normal appearance. He is normal weight.  HENT:     Head: Normocephalic and atraumatic.  Eyes:     Extraocular Movements: Extraocular movements intact.  Pulmonary:     Effort: Pulmonary effort is normal.  Neurological:     Mental Status: He is alert.    Review of Systems  Constitutional: Negative for fever.  Respiratory: Negative for cough.   Cardiovascular: Negative  for chest pain.  Gastrointestinal: Negative for abdominal pain.  Musculoskeletal: Negative for myalgias.  Neurological: Negative for headaches.  Psychiatric/Behavioral: Negative for depression and suicidal ideas.   Blood pressure 101/69, pulse 90, temperature 98.3 F (36.8 C), temperature source Oral, resp. rate 18, height 5\' 11"  (1.803 m), SpO2 98 %. Body mass index is 23.71 kg/m.  Demographic Factors:  Male, Caucasian and Low socioeconomic status  Loss Factors: Financial problems/change in socioeconomic status  Historical Factors: NA  Risk Reduction Factors:   Positive social support  Continued Clinical Symptoms:  Alcohol/Substance Abuse/Dependencies Previous Psychiatric Diagnoses and Treatments  Cognitive Features That Contribute To Risk:  Thought constriction (tunnel vision)    Suicide Risk:  Minimal: No identifiable suicidal ideation.  Patients presenting with no risk factors but with morbid ruminations; may be classified as minimal risk based on the severity of the depressive symptoms  Plan Of Care/Follow-up recommendations:  Activity:  as tolerated Diet:  regular Other:     Please come to [facility] (this facility) during walk in hours for appointment with psychiatrist for further medication management and for therapy.   Walk in hours are 8-11 AM Monday through Thursday for medication management.It is first come, first -serve; it is best to arrive by 7:00 AM. On Friday from 1 pm to 4 pm for therapy intake only. Please arrive by 12:00 pm as it is  first come, first -serve.   When you arrive please go upstairs for your appointment. If you are unsure of where to go, inform the front desk that you are here for a walk in appointment and they will assist you with directions upstairs.  Address:  [address] in [place], [place] Ph: [phone]     Disposition: self care  [phone], MD 07/08/2020, 12:20 PM

## 2020-07-08 NOTE — ED Notes (Signed)
Discharge instructions and housing provided. Pt stated understanding and requested transportation to [facility]. Explained to Pt that they are only provided one mode of transportation. Pt understood. One bus pass provided. Pt alert, orient and ambulatory. Personal belongings returned prior to being escorted to the front lobby. Safety maintained.

## 2020-07-11 ENCOUNTER — Emergency Department (HOSPITAL_COMMUNITY)
Admission: EM | Admit: 2020-07-11 | Discharge: 2020-07-11 | Disposition: A | Discharge status: Home / Self Care | Payer: Medicare Other | Attending: Emergency Medicine | Admitting: Emergency Medicine

## 2020-07-11 DIAGNOSIS — R10817 Generalized abdominal tenderness: Secondary | ICD-10-CM | POA: Diagnosis not present

## 2020-07-11 DIAGNOSIS — E119 Type 2 diabetes mellitus without complications: Secondary | ICD-10-CM | POA: Insufficient documentation

## 2020-07-11 DIAGNOSIS — Z765 Malingerer [conscious simulation]: Secondary | ICD-10-CM | POA: Diagnosis not present

## 2020-07-11 DIAGNOSIS — I10 Essential (primary) hypertension: Secondary | ICD-10-CM | POA: Insufficient documentation

## 2020-07-11 DIAGNOSIS — F17200 Nicotine dependence, unspecified, uncomplicated: Secondary | ICD-10-CM | POA: Insufficient documentation

## 2020-07-11 MED ORDER — ALUM & MAG HYDROXIDE-SIMETH 200-200-20 MG/5ML PO SUSP
30.0000 mL | Freq: Once | ORAL | Status: AC
  Administered 2020-07-11: 30 mL via ORAL
  Filled 2020-07-11 (×2): qty 30

## 2020-07-11 NOTE — ED Notes (Signed)
Passed fluid challenge without any difficulty or nausea/vomitting

## 2020-07-11 NOTE — ED Provider Notes (Signed)
[facility] EMERGENCY DEPARTMENT Provider Note   CSN: [id number] Arrival date & time: 07/11/20  0630     History Chief Complaint  Patient presents with  . Abdominal Pain    Alejandro Baker is a 45 y.o. male.  46 y.o male with a PMH of diabetes, hypertension noncompliant with medication presents to the ED via EMS for abdominal pain.  Patient was found asleep at a gas station, according to his records similar prior visits. He reports "maybe 1 episode of emesis", no active emesis during my evaluation. He is sleeping, does not want to provide further history. No SI or HI, no visual or auditory hallucinations.   The history is provided by the patient.  Abdominal Pain Pain location:  Generalized Associated symptoms: no chest pain, no fever, no nausea, no sore throat and no vomiting        Past Medical History:  Diagnosis Date  . Diabetes mellitus without complication (HCC)   . Hypertension     Patient Active Problem List   Diagnosis Date Noted  . Disorganized schizophrenia (HCC)     No past surgical history on file.     No family history on file.  Social History   Tobacco Use  . Smoking status: Current Every Day Smoker    Packs/day: 1.00    Years: 15.00    Pack years: 15.00  . Smokeless tobacco: Never Used  Substance Use Topics  . Alcohol use: Yes  . Drug use: Yes    Types: Methamphetamines    Home Medications Prior to Admission medications   Not on File    Allergies    Penicillins and Povidone-iodine  Review of Systems   Review of Systems  Constitutional: Negative for fever.  HENT: Negative for sore throat.   Cardiovascular: Negative for chest pain.  Gastrointestinal: Positive for abdominal pain. Negative for nausea and vomiting.  Genitourinary: Negative for flank pain.  Musculoskeletal: Negative for back pain.  Neurological: Negative for headaches.  All other systems reviewed and are negative.   Physical Exam Updated Vital  Signs BP (!) 102/57 (BP Location: Left Arm)   Pulse 83   Temp (!) 97.4 F (36.3 C) (Oral)   Resp 18   SpO2 98%   Physical Exam Vitals and nursing note reviewed.  Constitutional:      General: He is not in acute distress.    Appearance: He is not ill-appearing or toxic-appearing.     Comments: Disheveled in hospital shoes.   HENT:     Head: Normocephalic and atraumatic.  Eyes:     Comments: Pupils are equal and reactive.   Cardiovascular:     Rate and Rhythm: Normal rate.  Pulmonary:     Effort: Pulmonary effort is normal.     Breath sounds: No wheezing or rales.  Abdominal:     General: Abdomen is flat.     Palpations: Abdomen is soft.     Tenderness: There is generalized abdominal tenderness.  Skin:    General: Skin is warm and dry.  Neurological:     Mental Status: He is oriented to person, place, and time.  Psychiatric:        Mood and Affect: Mood is not anxious or depressed.        Behavior: Behavior normal.        Thought Content: Thought content is not delusional. Thought content does not include homicidal or suicidal ideation. Thought content does not include homicidal or suicidal plan.  ED Results / Procedures / Treatments   Labs (all labs ordered are listed, but only abnormal results are displayed) Labs Reviewed - No data to display  EKG None  Radiology No results found.  Procedures Procedures   Medications Ordered in ED Medications  alum & mag hydroxide-simeth (MAALOX/MYLANTA) 200-200-20 MG/5ML suspension 30 mL (30 mLs Oral Given 07/11/20 1660)    ED Course  I have reviewed the triage vital signs and the nursing notes.  Pertinent labs & imaging results that were available during my care of the patient were reviewed by me and considered in my medical decision making (see chart for details).    MDM Rules/Calculators/A&P    Patient here sleeping in room comfortably, vitals are within normal limits.  Abdominal soft, nontender to palpation.  He  is noncompliant with medication.  He is afebrile on today's visit, endorsing pain throughout his abdomen.  However, patient is asleep when I arrived in the room, he is refusing to provide further history.  He does report one episode of emesis but no active vomiting at that time.  He does report no compliance with medication per EMS.  CBG was 258, provided with GI cocktail.   10:06 AM patient has been asleep for the past 3 hours in the ED without any episodes of emesis.  Placed on stretcher by the nurses station, has been asleep.  Given GI cocktail.  Fluid and p.o. challenge completed, Cecily.  No emergent condition noted on today's visit, feel patient is stable for discharge.  Portions of this note were generated with Scientist, clinical (histocompatibility and immunogenetics). Dictation errors may occur despite best attempts at proofreading.  Final Clinical Impression(s) / ED Diagnoses Final diagnoses:  Malingering    Rx / DC Orders ED Discharge Orders    None       Claude Manges, PA-C 07/11/20 1007    Pollina, Canary Brim, MD 07/12/20 0425

## 2020-07-11 NOTE — ED Triage Notes (Signed)
Pt arrived via ems due to complaints of nausea,vomitting, abdominal pain for the past two days. EMS reports [employer] was called to a gas station where they found this pt sleeping, he then reported his abdominal pain,nausea,and vomiting to gpd which prompted ems' arrival. Pt is homeless. Pts vitals with ems: 160/100, rr 20, hr 108, sats 99, cbg 258, pt is noncompliant with his metformin. According to ems pt is axox4.

## 2020-07-25 ENCOUNTER — Emergency Department (HOSPITAL_COMMUNITY)
Admission: EM | Admit: 2020-07-25 | Discharge: 2020-07-26 | Disposition: A | Discharge status: Home / Self Care | Payer: Medicare Other | Attending: Emergency Medicine | Admitting: Emergency Medicine

## 2020-07-25 ENCOUNTER — Encounter (HOSPITAL_COMMUNITY): Payer: Self-pay | Admitting: *Deleted

## 2020-07-25 DIAGNOSIS — Z046 Encounter for general psychiatric examination, requested by authority: Secondary | ICD-10-CM | POA: Diagnosis present

## 2020-07-25 DIAGNOSIS — F17200 Nicotine dependence, unspecified, uncomplicated: Secondary | ICD-10-CM | POA: Diagnosis not present

## 2020-07-25 DIAGNOSIS — R443 Hallucinations, unspecified: Secondary | ICD-10-CM

## 2020-07-25 DIAGNOSIS — R440 Auditory hallucinations: Secondary | ICD-10-CM | POA: Insufficient documentation

## 2020-07-25 DIAGNOSIS — M79672 Pain in left foot: Secondary | ICD-10-CM | POA: Diagnosis not present

## 2020-07-25 DIAGNOSIS — M79671 Pain in right foot: Secondary | ICD-10-CM

## 2020-07-25 DIAGNOSIS — I10 Essential (primary) hypertension: Secondary | ICD-10-CM | POA: Insufficient documentation

## 2020-07-25 DIAGNOSIS — F1994 Other psychoactive substance use, unspecified with psychoactive substance-induced mood disorder: Secondary | ICD-10-CM

## 2020-07-25 DIAGNOSIS — R441 Visual hallucinations: Secondary | ICD-10-CM | POA: Diagnosis not present

## 2020-07-25 DIAGNOSIS — R739 Hyperglycemia, unspecified: Secondary | ICD-10-CM

## 2020-07-25 DIAGNOSIS — Z5900 Homelessness unspecified: Secondary | ICD-10-CM

## 2020-07-25 DIAGNOSIS — E119 Type 2 diabetes mellitus without complications: Secondary | ICD-10-CM | POA: Diagnosis not present

## 2020-07-25 DIAGNOSIS — Z8639 Personal history of other endocrine, nutritional and metabolic disease: Secondary | ICD-10-CM

## 2020-07-25 DIAGNOSIS — Z20822 Contact with and (suspected) exposure to covid-19: Secondary | ICD-10-CM | POA: Diagnosis not present

## 2020-07-25 HISTORY — DX: Schizoaffective disorder, bipolar type: F25.0

## 2020-07-25 LAB — ETHANOL: Alcohol, Ethyl (B): <10 mg/dL (ref <10)

## 2020-07-25 LAB — RESP PANEL BY RT-PCR (FLU A&B, COVID) ARPGX2
Influenza A by PCR: NEGATIVE
Influenza B by PCR: NEGATIVE
SARS Coronavirus 2 by RT PCR: NEGATIVE

## 2020-07-25 LAB — CBC WITH DIFFERENTIAL/PLATELET
Abs Immature Granulocytes: 0.03 10*3/uL (ref 0.00–0.07)
Basophils Absolute: 0.1 10*3/uL (ref 0.0–0.1)
Basophils Relative: 1 %
Eosinophils Absolute: 0.1 10*3/uL (ref 0.0–0.5)
Eosinophils Relative: 1 %
HCT: 44.7 % (ref 39.0–52.0)
Hemoglobin: 15.4 g/dL (ref 13.0–17.0)
Immature Granulocytes: 0 %
Lymphocytes Relative: 31 %
Lymphs Abs: 2.5 10*3/uL (ref 0.7–4.0)
MCH: 31.1 pg (ref 26.0–34.0)
MCHC: 34.5 g/dL (ref 30.0–36.0)
MCV: 90.3 fL (ref 80.0–100.0)
Monocytes Absolute: 0.6 10*3/uL (ref 0.1–1.0)
Monocytes Relative: 7 %
Neutro Abs: 4.9 10*3/uL (ref 1.7–7.7)
Neutrophils Relative %: 60 %
Platelets: 330 10*3/uL (ref 150–400)
RBC: 4.95 MIL/uL (ref 4.22–5.81)
RDW: 13.2 % (ref 11.5–15.5)
WBC: 8.2 10*3/uL (ref 4.0–10.5)
nRBC: 0 % (ref 0.0–0.2)

## 2020-07-25 LAB — ACETAMINOPHEN LEVEL: Acetaminophen (Tylenol), Serum: <10 ug/mL — ABNORMAL LOW (ref 10–30)

## 2020-07-25 LAB — COMPREHENSIVE METABOLIC PANEL
ALT: 24 U/L (ref 0–44)
AST: 22 U/L (ref 15–41)
Albumin: 3.8 g/dL (ref 3.5–5.0)
Alkaline Phosphatase: 69 U/L (ref 38–126)
Anion gap: 8 (ref 5–15)
BUN: 14 mg/dL (ref 6–20)
CO2: 26 mmol/L (ref 22–32)
Calcium: 9.2 mg/dL (ref 8.9–10.3)
Chloride: 103 mmol/L (ref 98–111)
Creatinine, Ser: 0.81 mg/dL (ref 0.61–1.24)
GFR, Estimated: >60 mL/min (ref >60)
Glucose, Bld: 338 mg/dL — ABNORMAL HIGH (ref 70–99)
Potassium: 3.9 mmol/L (ref 3.5–5.1)
Sodium: 137 mmol/L (ref 135–145)
Total Bilirubin: 0.8 mg/dL (ref 0.3–1.2)
Total Protein: 6.9 g/dL (ref 6.5–8.1)

## 2020-07-25 LAB — SALICYLATE LEVEL: Salicylate Lvl: <7 mg/dL — ABNORMAL LOW (ref 7.0–30.0)

## 2020-07-25 MED ORDER — ZIPRASIDONE MESYLATE 20 MG IM SOLR
20.0000 mg | Freq: Once | INTRAMUSCULAR | Status: AC
  Administered 2020-07-25: 20 mg via INTRAMUSCULAR
  Filled 2020-07-25: qty 20

## 2020-07-25 MED ORDER — ACETAMINOPHEN 325 MG PO TABS: 650.0000 mg | ORAL_TABLET | ORAL | Status: DC | PRN

## 2020-07-25 MED ORDER — STERILE WATER FOR INJECTION IJ SOLN
INTRAMUSCULAR | Status: AC
  Filled 2020-07-25: qty 10

## 2020-07-25 NOTE — ED Notes (Signed)
Attempted to wake pt up for TTS and pt not waking up. BH counselor made aware. Will attempt at a later time. Unable to get pt VS at this time or perform shift psych assessment. Pt received Geodon earlier per previous RN and pt has not woken up. Pt noted to have equal rise and fall of chest. Sitter remains at bedside.

## 2020-07-25 NOTE — BH Assessment (Signed)
Per Drinda Butts, RN pt is sleeping and unable to engage in TTS assessment. Pt will be assessed once the pt is alert,able to engage in assessment.    Redmond Pulling, MS, [facility], [facility] Triage Specialist [phone]

## 2020-07-25 NOTE — ED Notes (Signed)
Patient still awake.  PD and security still at bedside

## 2020-07-25 NOTE — ED Notes (Signed)
Clothing removed and inventoried.  Pants, tan shirt, burgandy sweatshirt, grey jack, white shoes and lottery ticket.
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
## 2020-07-25 NOTE — ED Notes (Signed)
GOTT [name] SOAKING HIS FEET AT THIS TIME
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
## 2020-07-25 NOTE — ED Notes (Signed)
Still need respiratory panel and UDS
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
## 2020-07-25 NOTE — ED Notes (Signed)
Placed pt. Belongs in locker #2 on purple zone
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
## 2020-07-25 NOTE — ED Notes (Signed)
Patient sleeping at this time.

## 2020-07-25 NOTE — ED Notes (Addendum)
Patient has undress in burgandy scrubs now waiting on to be wanded.gave pt.snack decef coffee sandwich

## 2020-07-26 MED ORDER — STERILE WATER FOR INJECTION IJ SOLN
INTRAMUSCULAR | Status: AC
  Administered 2020-07-26: 10 mL
  Filled 2020-07-26: qty 10

## 2020-07-26 MED ORDER — ZIPRASIDONE MESYLATE 20 MG IM SOLR
20.0000 mg | Freq: Once | INTRAMUSCULAR | Status: AC
  Administered 2020-07-26: 20 mg via INTRAMUSCULAR
  Filled 2020-07-26: qty 20

## 2020-07-26 NOTE — ED Provider Notes (Signed)
Patient reassessed - pt is calm, cooperative, conversant. He is alert, oriented. Currently exhibits normal mood and affect, and reports feeling much improved today.  Pt does not express any thoughts of harm to self or others, no SI/HI.  Pt does not appear to be responding to internal stimuli. No current hallucinations or delusions. He does indicate he is content being homeless, living near [place]. He indicates does have family in [place], but that living with them is not an option. He also indicates he does not like the shelter.   He indicates is hungry now - pt provided [place] sandwich, po fluids, cheese/crackers.   SW/TOC asked to provide community resources/social service resources.   Pt denies any physical symptoms or c/o, and currently appears stable for d/c.   Resource guides provided..  Return precautions provided.        Cathren Laine, MD 07/26/20 [phone]

## 2020-07-26 NOTE — ED Notes (Signed)
Patient grabbing bed rails shaking bed.

## 2020-07-26 NOTE — BH Assessment (Addendum)
Upon chart review @0337 , "Per , RN pt is sleeping and unable to engage in TTS assessment. Pt will be assessed once the pt is alert, able to engage in assessment".    @ [phone] Clinician sent a secure chat to [place], RN/ WANG WAUK, RN following up regarding patient's status to complete his TTS assessment.  Awaiting response.  @0850 , Clinician was notified by Jamey Ripa, RN, that patient is sleeping due to [place] given at 0530. Clinician requested nursing to notify TTS once patient is alert and able to engage in a TTS assessment.

## 2020-07-26 NOTE — Discharge Instructions (Signed)
It was our pleasure to provide your ER care today - we hope that you feel better.  Make sure to keep feet clean and dry.  See attached diabetes information.   Use resource guides provided in terms of accessing local shelters and other social services in the community.   Follow up with primary care doctor in the next 1-2 weeks.   For mental health issues and/or crisis, you may go directly to the behavioral health urgent care center - it is open 24/7 and walk-ins are welcome.  Return to ER if worse, new symptoms, fevers, new or severe pain, trouble breathing, weak/fainting, or other concern.

## 2020-07-26 NOTE — BH Assessment (Signed)
Per Everardo Pacific, RN pt is sleeping and unable to engage in TTS assessment. Pt will be assessed once the pt is alert, able to engage in assessment.     Redmond Pulling, MS, [facility], [facility] Triage Specialist [phone]

## 2020-07-26 NOTE — ED Notes (Addendum)
Patient attempted to leave unit. Multiple staff needed to redirect patient back to room.  Patient verbally aggressive toward security.

## 2020-07-26 NOTE — ED Notes (Addendum)
Patient in room cursing and threatening staff.

## 2020-07-26 NOTE — ED Notes (Signed)
Pt awake, calm and cooperative at this time. Pt requesting the lights to be cut on and for a cup of coffee. Sitter at bedside and this RN will continue to monitor.

## 2020-08-18 ENCOUNTER — Emergency Department (HOSPITAL_COMMUNITY)
Admission: EM | Admit: 2020-08-18 | Discharge: 2020-08-18 | Disposition: A | Discharge status: Home / Self Care | Payer: Medicare Other | Attending: Emergency Medicine | Admitting: Emergency Medicine

## 2020-08-18 ENCOUNTER — Emergency Department (HOSPITAL_COMMUNITY): Payer: Medicare Other

## 2020-08-18 ENCOUNTER — Encounter (HOSPITAL_COMMUNITY): Payer: Self-pay

## 2020-08-18 ENCOUNTER — Other Ambulatory Visit: Payer: Self-pay

## 2020-08-18 DIAGNOSIS — F17200 Nicotine dependence, unspecified, uncomplicated: Secondary | ICD-10-CM | POA: Insufficient documentation

## 2020-08-18 DIAGNOSIS — E1140 Type 2 diabetes mellitus with diabetic neuropathy, unspecified: Secondary | ICD-10-CM | POA: Insufficient documentation

## 2020-08-18 DIAGNOSIS — I10 Essential (primary) hypertension: Secondary | ICD-10-CM | POA: Diagnosis not present

## 2020-08-18 DIAGNOSIS — M79672 Pain in left foot: Secondary | ICD-10-CM | POA: Diagnosis not present

## 2020-08-18 DIAGNOSIS — M79671 Pain in right foot: Secondary | ICD-10-CM | POA: Diagnosis present

## 2020-08-18 HISTORY — DX: Polyneuropathy, unspecified: G62.9

## 2020-08-18 NOTE — ED Triage Notes (Signed)
Brought in by [facility] EMS - c/o bilateral foot pain (chronic). Hx of neuropathy. BGL 295mg /dl.

## 2020-08-18 NOTE — ED Triage Notes (Signed)
Emergency Medicine Provider Triage Evaluation Note  Carlton Buskey , a 45 y.o. male  was evaluated in triage.  Pt complains of left foot pain. States he broke his foot.   Review of Systems  Positive: Foot pain Negative: Si, HI, hallucinations.   Physical Exam  BP (!) 147/108   Pulse 91   Temp 98.5 F (36.9 C) (Oral)   Resp 16   Ht 5\' 11"  (1.803 m)   Wt 77.1 kg   SpO2 99%   BMI 23.71 kg/m  Gen:   Awake, no distress   HEENT:  Atraumatic  Resp:  Normal effort  Cardiac:  Normal rate , 2+ DP pulse Abd:   Nondistended, nontender  MSK:   L foot- maceration in the web spaces. Patient complaining of pain with digit palpation.  Neuro:  Speech clear. Oriented x 3.   Medical Decision Making  Medically screening exam initiated at 1:35 AM.  Appropriate orders placed.  Jowel Waltner was informed that the remainder of the evaluation will be completed by another provider, this initial triage assessment does not replace that evaluation, and the importance of remaining in the ED until their evaluation is complete.  Clinical Impression  Foot pain.    [name], Cherly Anderson 08/18/20 0136

## 2020-08-18 NOTE — ED Provider Notes (Signed)
[facility] EMERGENCY DEPARTMENT Provider Note   CSN: [id number] Arrival date & time: 08/18/20  0126     History Chief Complaint  Patient presents with  . Foot Pain    Alejandro Baker is a 45 y.o. male.  HPI Patient is a 45 year old male presented today with bilateral foot pain.  He states left greater than right.  He is concerned for foot fracture.  He denies any trauma to his foot.  He states that "they always hurt "he is uncertain when it started hurting but he states greater than 3 years ago.  Patient denies any urinary frequency urgency, abdominal pain, fevers, chills, foot trauma, fall, loss of consciousness, head injury, nausea or vomiting.  He describes his left foot pain is achy, constant, worse with walking which he states he does for long distances.     Past Medical History:  Diagnosis Date  . Diabetes mellitus without complication (HCC)   . Hypertension   . Neuropathy   . Schizoaffective disorder, bipolar type [facility])     Patient Active Problem List   Diagnosis Date Noted  . Disorganized schizophrenia (HCC)     History reviewed. No pertinent surgical history.     History reviewed. No pertinent family history.  Social History   Tobacco Use  . Smoking status: Current Every Day Smoker    Packs/day: 1.00    Years: 15.00    Pack years: 15.00  . Smokeless tobacco: Never Used  Substance Use Topics  . Alcohol use: Yes  . Drug use: Yes    Home Medications Prior to Admission medications   Not on File    Allergies    Penicillins and Povidone-iodine  Review of Systems   Review of Systems  Constitutional: Negative for chills and fever.  HENT: Negative for congestion.   Respiratory: Negative for shortness of breath.   Cardiovascular: Negative for chest pain.  Gastrointestinal: Negative for abdominal pain.  Musculoskeletal: Negative for neck pain.       Left foot pain/bilateral foot pain    Physical Exam Updated Vital Signs BP  (!) 147/108   Pulse 91   Temp 98.5 F (36.9 C) (Oral)   Resp 16   Ht 5\' 11"  (1.803 m)   Wt 77.1 kg   SpO2 99%   BMI 23.71 kg/m   Physical Exam Vitals and nursing note reviewed.  Constitutional:      General: He is not in acute distress.    Appearance: Normal appearance. He is not ill-appearing.  HENT:     Head: Normocephalic and atraumatic.  Eyes:     General: No scleral icterus.       Right eye: No discharge.        Left eye: No discharge.     Conjunctiva/sclera: Conjunctivae normal.  Pulmonary:     Effort: Pulmonary effort is normal.     Breath sounds: No stridor.  Musculoskeletal:     Comments: Bilateral lower extremities with no deformity, bruising, focal tenderness to palpation.  Good bilateral DP PT pulses.  Moves all 4 extremities with good strength.  Skin:    Comments: No rashes lacerations or abrasions.  Neurological:     Mental Status: He is alert and oriented to person, place, and time. Mental status is at baseline.     ED Results / Procedures / Treatments   Labs (all labs ordered are listed, but only abnormal results are displayed) Labs Reviewed - No data to display  EKG None  Radiology  DG Foot Complete Left  Result Date: 08/18/2020 CLINICAL DATA:  Bilateral foot pain, left greater than right, symptoms are chronic, history of neuropathy. EXAM: LEFT FOOT - COMPLETE 3+ VIEW COMPARISON:  Radiographs 02/01/2020 FINDINGS: There is no evidence of fracture or dislocation. No suspicious lytic or blastic lesions. Mild arthrosis in the midfoot and forefoot. Posterior calcaneal spur at the Achilles tendon insertion. Soft tissues are unremarkable. IMPRESSION: 1. No acute osseous abnormality. 2. Mild degenerative changes in the midfoot and forefoot. Electronically Signed   By: Kreg Shropshire M.D.   On: 08/18/2020 01:59    Procedures Procedures   Medications Ordered in ED Medications - No data to display  ED Course  I have reviewed the triage vital signs and the  nursing notes.  Pertinent labs & imaging results that were available during my care of the patient were reviewed by me and considered in my medical decision making (see chart for details).    MDM Rules/Calculators/A&P                          Patient is healthy-appearing 45 year old male in no acute distress he is asleep in bed.  He woke quickly to my verbal stimulus.  Physical exam is unremarkable.  Patient is rather reticent but states that he has had ongoing bilateral foot pain for years.  Seems to be no significant change present today.  Patient is not very forthcoming about reasons for his ER visit today.  Highly likely that patient is here for secondary gain of shelter at this time.  Will discharge home with recommendations for Tylenol.  He will follow-up with PCP.  Return precautions given.  Final Clinical Impression(s) / ED Diagnoses Final diagnoses:  Pain in both feet    Rx / DC Orders ED Discharge Orders    None       Gailen Shelter, [place] 08/18/20 0941    Vanetta Mulders, MD 08/19/20 [phone]

## 2020-08-18 NOTE — ED Notes (Signed)
Pt refused last VS and pt refused to leave when D/C'd. I had to call security to get pt removed.

## 2020-08-18 NOTE — Discharge Instructions (Addendum)
Please take tylenol 1000 mg every 6 hours as needed for pain

## 2020-08-27 ENCOUNTER — Emergency Department (HOSPITAL_COMMUNITY)
Admission: EM | Admit: 2020-08-27 | Discharge: 2020-08-27 | Disposition: A | Discharge status: Home / Self Care | Payer: Medicare Other | Attending: Emergency Medicine | Admitting: Emergency Medicine

## 2020-08-27 ENCOUNTER — Other Ambulatory Visit: Payer: Self-pay

## 2020-08-27 ENCOUNTER — Encounter (HOSPITAL_COMMUNITY): Payer: Self-pay

## 2020-08-27 DIAGNOSIS — M79672 Pain in left foot: Secondary | ICD-10-CM | POA: Diagnosis not present

## 2020-08-27 DIAGNOSIS — M79671 Pain in right foot: Secondary | ICD-10-CM | POA: Diagnosis not present

## 2020-08-27 DIAGNOSIS — E1140 Type 2 diabetes mellitus with diabetic neuropathy, unspecified: Secondary | ICD-10-CM | POA: Insufficient documentation

## 2020-08-27 DIAGNOSIS — I10 Essential (primary) hypertension: Secondary | ICD-10-CM | POA: Diagnosis not present

## 2020-08-27 DIAGNOSIS — F17200 Nicotine dependence, unspecified, uncomplicated: Secondary | ICD-10-CM | POA: Insufficient documentation

## 2020-08-27 NOTE — ED Notes (Signed)
Care team applied new hospital-issued socks to patient. Pt put on shoes independently, given coffee and crackers, and continued to eat/drink snacks at nursing station while standing. Pt ambulated out of ED w/o assistance.

## 2020-08-27 NOTE — ED Triage Notes (Addendum)
Pt arriving via EMS from [facility] after being attacked by a dog biting both feet and right hand. Pt cursing at staff and refusing vital signs.

## 2020-08-27 NOTE — Discharge Instructions (Signed)
You were seen in the ER today for foot pain.  Your physical exam is reassuring.  You werre given new socks.  Please follow-up with your primary care provider, see attached resources.

## 2020-08-27 NOTE — ED Provider Notes (Signed)
[facility]-EMERGENCY DEPT Provider Note   CSN: [id number] Arrival date & time: 08/27/20  0034     History Chief Complaint  Patient presents with  . Foot Pain    Alejandro Baker is a 45 y.o. male with a history of diabetes mellitus, hypertension, tobacco abuse, and schizoaffective disorder who presents to the emergency department via EMS with complaints of foot pain.  Patient states his feet hurt from walking.  He denies injury to me.  No alleviating or aggravating factors other than walking.  Denies any other areas of injury.  Per triage note he arrived via EMS from [employer] after he was reportedly attacked by a dog, I personally asked the patient he denies being attacked by dog.    HPI     Past Medical History:  Diagnosis Date  . Diabetes mellitus without complication (HCC)   . Hypertension   . Neuropathy   . Schizoaffective disorder, bipolar type [facility])     Patient Active Problem List   Diagnosis Date Noted  . Disorganized schizophrenia (HCC)     History reviewed. No pertinent surgical history.     No family history on file.  Social History   Tobacco Use  . Smoking status: Current Every Day Smoker    Packs/day: 1.00    Years: 15.00    Pack years: 15.00  . Smokeless tobacco: Never Used  Substance Use Topics  . Alcohol use: Yes  . Drug use: Yes    Home Medications Prior to Admission medications   Not on File    Allergies    Penicillins and Povidone-iodine  Review of Systems   Review of Systems  Constitutional: Negative for fever.  Respiratory: Negative for shortness of breath.   Cardiovascular: Negative for chest pain.  Gastrointestinal: Negative for abdominal pain.  Musculoskeletal: Positive for arthralgias.  Neurological: Negative for syncope.  All other systems reviewed and are negative.   Physical Exam Updated Vital Signs BP 130/83 (BP Location: Left Arm)   Pulse 72   Temp 98 F (36.7 C) (Oral)   Resp 18   SpO2  94%   Physical Exam Vitals and nursing note reviewed.  Constitutional:      General: He is not in acute distress.    Appearance: He is not ill-appearing or toxic-appearing.  HENT:     Head: Normocephalic and atraumatic.  Cardiovascular:     Rate and Rhythm: Normal rate.     Pulses:          Dorsalis pedis pulses are 2+ on the right side and 2+ on the left side.       Posterior tibial pulses are 2+ on the right side and 2+ on the left side.  Pulmonary:     Effort: Pulmonary effort is normal.  Musculoskeletal:     Comments: Upper extremities: No significant open wounds.  Intact active range of motion throughout the digits and wrist.  No focal bony tenderness to palpation. Lower extremities: Plantar aspect of the feet are moist bilaterally.  Patient has abrasions to the heels in the area where his shoe rubs against his skin.  There is no significant erythema, warmth, drainage, or active bleeding.  There are no other wounds noted.  Specifically no puncture/bite appearing wounds.  Patient is able to move all digits and the ankles.  He has no focal bony tenderness.  Skin:    General: Skin is warm and dry.     Capillary Refill: Capillary refill takes less than 2  seconds.  Neurological:     Mental Status: He is alert.     Comments: Alert. Clear speech. Sensation grossly intact to bilateral lower extremities. 5/5 strength with plantar/dorsiflexion bilaterally. Patient ambulatory.   Psychiatric:        Mood and Affect: Mood normal.        Behavior: Behavior normal.     ED Results / Procedures / Treatments   Labs (all labs ordered are listed, but only abnormal results are displayed) Labs Reviewed - No data to display  EKG None  Radiology No results found.  Procedures Procedures   Medications Ordered in ED Medications - No data to display  ED Course  I have reviewed the triage vital signs and the nursing notes.  Pertinent labs & imaging results that were available during my  care of the patient were reviewed by me and considered in my medical decision making (see chart for details).    MDM Rules/Calculators/A&P                         Patient presents to the emergency department with complaints of foot pain bilaterally.  States this is chronic.  He is nontoxic, resting comfortably, vitals on my evaluation are within normal limits.  There was some mention that he may have been attacked by a dog by the triage staff, patient denies this to me, I do not appreciate any puncture wounds or findings to indicate dog bites.  He has some small abrasions to his heels consistent with where his shoe rubs against them, he is not wearing socks.  No signs of infection.  Patient allowed to sleep in the emergency department.  Given new socks.  Appears appropriate for discharge.  Final Clinical Impression(s) / ED Diagnoses Final diagnoses:  Bilateral foot pain    Rx / DC Orders ED Discharge Orders    None       Cherly Anderson, PA-C 08/27/20 1478    [name], MD 08/27/20 [phone]

## 2020-09-03 ENCOUNTER — Emergency Department (HOSPITAL_COMMUNITY)
Admission: EM | Admit: 2020-09-03 | Discharge: 2020-09-03 | Disposition: A | Discharge status: Home / Self Care | Payer: Medicare Other | Attending: Emergency Medicine | Admitting: Emergency Medicine

## 2020-09-03 ENCOUNTER — Encounter (HOSPITAL_COMMUNITY): Payer: Self-pay | Admitting: Emergency Medicine

## 2020-09-03 DIAGNOSIS — Z888 Allergy status to other drugs, medicaments and biological substances status: Secondary | ICD-10-CM | POA: Diagnosis not present

## 2020-09-03 DIAGNOSIS — E119 Type 2 diabetes mellitus without complications: Secondary | ICD-10-CM | POA: Insufficient documentation

## 2020-09-03 DIAGNOSIS — Z72820 Sleep deprivation: Secondary | ICD-10-CM | POA: Diagnosis not present

## 2020-09-03 DIAGNOSIS — Z880 Allergy status to penicillin: Secondary | ICD-10-CM | POA: Insufficient documentation

## 2020-09-03 DIAGNOSIS — I10 Essential (primary) hypertension: Secondary | ICD-10-CM | POA: Diagnosis not present

## 2020-09-03 DIAGNOSIS — Z5900 Homelessness unspecified: Secondary | ICD-10-CM | POA: Diagnosis present

## 2020-09-03 DIAGNOSIS — F17200 Nicotine dependence, unspecified, uncomplicated: Secondary | ICD-10-CM | POA: Diagnosis not present

## 2020-09-03 DIAGNOSIS — Z0279 Encounter for issue of other medical certificate: Secondary | ICD-10-CM | POA: Insufficient documentation

## 2020-09-03 MED ORDER — ONDANSETRON 4 MG PO TBDP: 4.0000 mg | ORAL_TABLET | Freq: Once | ORAL | Status: DC

## 2020-09-03 NOTE — ED Notes (Signed)
Pt continues to refuse all care. Refuses vitals. Security called to assist patient out of ED. D/c papers reviewed with patient. Refused to sign at d/c.

## 2020-09-03 NOTE — ED Triage Notes (Signed)
Pt called EMS, but would not give a complaint. He is homeless from a gas station that he is banned from. Frequently here for malingering and BH complaints. He is refusing vitals at this time and refuses to sign MSE.

## 2020-09-03 NOTE — ED Notes (Addendum)
Pt declined CBG 

## 2020-09-03 NOTE — ED Provider Notes (Signed)
[facility]-EMERGENCY DEPT Provider Note   CSN: [id number] Arrival date & time: 09/03/20  0156     History Chief Complaint  Patient presents with  . Medical Clearance    Alejandro Baker is a 45 y.o. male.  45 year old male presents to the emergency department for evaluation.  States that he does not feel well because he has not been able to sleep.  He is homeless and called EMS from a gas station that he has been banned from.  Denies alcohol and drug use.  No other acute complaints today.       Past Medical History:  Diagnosis Date  . Diabetes mellitus without complication (HCC)   . Hypertension   . Neuropathy   . Schizoaffective disorder, bipolar type [facility])     Patient Active Problem List   Diagnosis Date Noted  . Disorganized schizophrenia (HCC)     History reviewed. No pertinent surgical history.     History reviewed. No pertinent family history.  Social History   Tobacco Use  . Smoking status: Current Every Day Smoker    Packs/day: 1.00    Years: 15.00    Pack years: 15.00  . Smokeless tobacco: Never Used  Substance Use Topics  . Alcohol use: Yes  . Drug use: Yes    Home Medications Prior to Admission medications   Not on File    Allergies    Penicillins and Povidone-iodine  Review of Systems   Review of Systems  Ten systems reviewed and are negative for acute change, except as noted in the HPI.    Physical Exam Updated Vital Signs There were no vitals taken for this visit.  Physical Exam Vitals and nursing note reviewed.  Constitutional:      General: He is not in acute distress.    Appearance: He is well-developed. He is not diaphoretic.     Comments: Tanned skin. Disheveled.  HENT:     Head: Normocephalic and atraumatic.  Eyes:     General: No scleral icterus.    Conjunctiva/sclera: Conjunctivae normal.  Pulmonary:     Effort: Pulmonary effort is normal. No respiratory distress.     Comments: Respirations even  and unlabored Musculoskeletal:        General: Normal range of motion.     Cervical back: Normal range of motion.  Skin:    General: Skin is warm and dry.     Coloration: Skin is not pale.     Findings: No erythema or rash.  Neurological:     Mental Status: He is alert and oriented to person, place, and time.  Psychiatric:        Behavior: Behavior normal.     ED Results / Procedures / Treatments   Labs (all labs ordered are listed, but only abnormal results are displayed) Labs Reviewed - No data to display  EKG None  Radiology No results found.  Procedures Procedures   Medications Ordered in ED Medications  ondansetron (ZOFRAN-ODT) disintegrating tablet 4 mg (has no administration in time range)    ED Course  I have reviewed the triage vital signs and the nursing notes.  Pertinent labs & imaging results that were available during my care of the patient were reviewed by me and considered in my medical decision making (see chart for details).    MDM Rules/Calculators/A&P                          45 year old  male presents to the emergency department via EMS from a gas station.  Complains of not being able to sleep and states this is causing him not to feel well.  He is not intent on participating in his ED evaluation.  Often ignores my questions to continue sleeping.  Do not have any concern for acute or emergent process at this time.  Patient discharged with information on local area shelters.   Final Clinical Impression(s) / ED Diagnoses Final diagnoses:  Homeless    Rx / DC Orders ED Discharge Orders    None       Antony Madura, PA-C 09/03/20 0309    Molpus, Jonny Ruiz, MD 09/03/20 [phone]

## 2020-09-16 ENCOUNTER — Other Ambulatory Visit: Payer: Self-pay

## 2020-09-16 ENCOUNTER — Emergency Department (HOSPITAL_COMMUNITY)
Admission: EM | Admit: 2020-09-16 | Discharge: 2020-09-17 | Disposition: A | Discharge status: Home / Self Care | Payer: Medicare Other | Attending: Emergency Medicine | Admitting: Emergency Medicine

## 2020-09-16 ENCOUNTER — Ambulatory Visit (INDEPENDENT_AMBULATORY_CARE_PROVIDER_SITE_OTHER)
Admission: EM | Admit: 2020-09-16 | Discharge: 2020-09-16 | Disposition: A | Discharge status: Other Acute Inpatient Hospital | Payer: Medicare Other | Source: Home / Self Care

## 2020-09-16 DIAGNOSIS — Z8659 Personal history of other mental and behavioral disorders: Secondary | ICD-10-CM | POA: Insufficient documentation

## 2020-09-16 DIAGNOSIS — Z9114 Patient's other noncompliance with medication regimen: Secondary | ICD-10-CM | POA: Insufficient documentation

## 2020-09-16 DIAGNOSIS — I10 Essential (primary) hypertension: Secondary | ICD-10-CM | POA: Diagnosis not present

## 2020-09-16 DIAGNOSIS — F17200 Nicotine dependence, unspecified, uncomplicated: Secondary | ICD-10-CM | POA: Insufficient documentation

## 2020-09-16 DIAGNOSIS — F1910 Other psychoactive substance abuse, uncomplicated: Secondary | ICD-10-CM | POA: Diagnosis present

## 2020-09-16 DIAGNOSIS — E1140 Type 2 diabetes mellitus with diabetic neuropathy, unspecified: Secondary | ICD-10-CM | POA: Diagnosis not present

## 2020-09-16 DIAGNOSIS — Z79899 Other long term (current) drug therapy: Secondary | ICD-10-CM | POA: Insufficient documentation

## 2020-09-16 DIAGNOSIS — Z046 Encounter for general psychiatric examination, requested by authority: Secondary | ICD-10-CM | POA: Diagnosis present

## 2020-09-16 DIAGNOSIS — R21 Rash and other nonspecific skin eruption: Secondary | ICD-10-CM | POA: Diagnosis not present

## 2020-09-16 DIAGNOSIS — Z20822 Contact with and (suspected) exposure to covid-19: Secondary | ICD-10-CM | POA: Insufficient documentation

## 2020-09-16 DIAGNOSIS — F17210 Nicotine dependence, cigarettes, uncomplicated: Secondary | ICD-10-CM | POA: Insufficient documentation

## 2020-09-16 DIAGNOSIS — F29 Unspecified psychosis not due to a substance or known physiological condition: Secondary | ICD-10-CM

## 2020-09-16 DIAGNOSIS — F1999 Other psychoactive substance use, unspecified with unspecified psychoactive substance-induced disorder: Secondary | ICD-10-CM | POA: Diagnosis present

## 2020-09-16 DIAGNOSIS — F250 Schizoaffective disorder, bipolar type: Secondary | ICD-10-CM | POA: Diagnosis present

## 2020-09-16 LAB — RESP PANEL BY RT-PCR (FLU A&B, COVID) ARPGX2
Influenza A by PCR: NEGATIVE
Influenza B by PCR: NEGATIVE
SARS Coronavirus 2 by RT PCR: NEGATIVE

## 2020-09-16 LAB — COMPREHENSIVE METABOLIC PANEL
ALT: 23 U/L (ref 0–44)
AST: 20 U/L (ref 15–41)
Albumin: 3.9 g/dL (ref 3.5–5.0)
Alkaline Phosphatase: 71 U/L (ref 38–126)
Anion gap: 7 (ref 5–15)
BUN: 17 mg/dL (ref 6–20)
CO2: 27 mmol/L (ref 22–32)
Calcium: 9.1 mg/dL (ref 8.9–10.3)
Chloride: 104 mmol/L (ref 98–111)
Creatinine, Ser: 0.66 mg/dL (ref 0.61–1.24)
GFR, Estimated: >60 mL/min (ref >60)
Glucose, Bld: 178 mg/dL — ABNORMAL HIGH (ref 70–99)
Potassium: 4.1 mmol/L (ref 3.5–5.1)
Sodium: 138 mmol/L (ref 135–145)
Total Bilirubin: 0.4 mg/dL (ref 0.3–1.2)
Total Protein: 7.1 g/dL (ref 6.5–8.1)

## 2020-09-16 LAB — CBC WITH DIFFERENTIAL/PLATELET
Abs Immature Granulocytes: 0.02 10*3/uL (ref 0.00–0.07)
Basophils Absolute: 0.1 10*3/uL (ref 0.0–0.1)
Basophils Relative: 1 %
Eosinophils Absolute: 0.2 10*3/uL (ref 0.0–0.5)
Eosinophils Relative: 3 %
HCT: 47.7 % (ref 39.0–52.0)
Hemoglobin: 15.8 g/dL (ref 13.0–17.0)
Immature Granulocytes: 0 %
Lymphocytes Relative: 39 %
Lymphs Abs: 3.1 10*3/uL (ref 0.7–4.0)
MCH: 30.1 pg (ref 26.0–34.0)
MCHC: 33.1 g/dL (ref 30.0–36.0)
MCV: 90.9 fL (ref 80.0–100.0)
Monocytes Absolute: 0.7 10*3/uL (ref 0.1–1.0)
Monocytes Relative: 8 %
Neutro Abs: 4 10*3/uL (ref 1.7–7.7)
Neutrophils Relative %: 49 %
Platelets: 275 10*3/uL (ref 150–400)
RBC: 5.25 MIL/uL (ref 4.22–5.81)
RDW: 13.2 % (ref 11.5–15.5)
WBC: 8.1 10*3/uL (ref 4.0–10.5)
nRBC: 0 % (ref 0.0–0.2)

## 2020-09-16 LAB — ETHANOL: Alcohol, Ethyl (B): <10 mg/dL (ref <10)

## 2020-09-16 LAB — ACETAMINOPHEN LEVEL: Acetaminophen (Tylenol), Serum: <10 ug/mL — ABNORMAL LOW (ref 10–30)

## 2020-09-16 LAB — SALICYLATE LEVEL: Salicylate Lvl: <7 mg/dL — ABNORMAL LOW (ref 7.0–30.0)

## 2020-09-16 MED ORDER — TRAZODONE HCL 50 MG PO TABS: 50.0000 mg | ORAL_TABLET | Freq: Every evening | ORAL | Status: DC | PRN

## 2020-09-16 MED ORDER — DIPHENHYDRAMINE HCL 50 MG/ML IJ SOLN: 50.0000 mg | Freq: Four times a day (QID) | INTRAMUSCULAR | Status: DC | PRN

## 2020-09-16 MED ORDER — MAGNESIUM HYDROXIDE 400 MG/5ML PO SUSP: 30.0000 mL | Freq: Every day | ORAL | Status: DC | PRN

## 2020-09-16 MED ORDER — ALUM & MAG HYDROXIDE-SIMETH 200-200-20 MG/5ML PO SUSP: 30.0000 mL | ORAL | Status: DC | PRN

## 2020-09-16 MED ORDER — LORAZEPAM 1 MG PO TABS
2.0000 mg | ORAL_TABLET | Freq: Four times a day (QID) | ORAL | Status: DC | PRN
  Administered 2020-09-16: 2 mg via ORAL
  Filled 2020-09-16: qty 2

## 2020-09-16 MED ORDER — HALOPERIDOL LACTATE 5 MG/ML IJ SOLN: 10.0000 mg | Freq: Four times a day (QID) | INTRAMUSCULAR | Status: DC | PRN

## 2020-09-16 MED ORDER — ACETAMINOPHEN 325 MG PO TABS: 650.0000 mg | ORAL_TABLET | Freq: Four times a day (QID) | ORAL | Status: DC | PRN

## 2020-09-16 MED ORDER — RISPERIDONE 2 MG PO TABS: 2.0000 mg | ORAL_TABLET | Freq: Two times a day (BID) | ORAL | Status: DC

## 2020-09-16 MED ORDER — HALOPERIDOL 5 MG PO TABS
5.0000 mg | ORAL_TABLET | Freq: Four times a day (QID) | ORAL | Status: DC | PRN
  Administered 2020-09-16: 5 mg via ORAL
  Filled 2020-09-16: qty 1

## 2020-09-16 MED ORDER — DIPHENHYDRAMINE HCL 50 MG PO CAPS
50.0000 mg | ORAL_CAPSULE | Freq: Four times a day (QID) | ORAL | Status: DC | PRN
  Administered 2020-09-16: 50 mg via ORAL
  Filled 2020-09-16: qty 1

## 2020-09-16 MED ORDER — MICONAZOLE NITRATE 2 % EX CREA
TOPICAL_CREAM | Freq: Two times a day (BID) | CUTANEOUS | Status: DC
  Administered 2020-09-17: 1 via TOPICAL
  Filled 2020-09-16: qty 28

## 2020-09-16 MED ORDER — METFORMIN HCL 500 MG PO TABS: 500.0000 mg | ORAL_TABLET | Freq: Two times a day (BID) | ORAL | Status: DC

## 2020-09-16 MED ORDER — LORAZEPAM 2 MG/ML IJ SOLN: 2.0000 mg | Freq: Four times a day (QID) | INTRAMUSCULAR | Status: DC | PRN

## 2020-09-16 NOTE — ED Notes (Signed)
Patient transferred to [facility] in [facility] Custody.

## 2020-09-16 NOTE — Progress Notes (Signed)
RN report called to Consulting civil engineer, Patty at [facility] to inform of patient's transfer doing to needing a higher level of care, due to aggressive  Behaviors and would arrive by [employer], IVC'd.

## 2020-09-16 NOTE — ED Notes (Signed)
Pt belongings consisted of lime green sweater and sweat pants.  (Pt did not come with any shoes)

## 2020-09-16 NOTE — ED Provider Notes (Signed)
[facility]-EMERGENCY DEPT Provider Note   CSN: [id number] Arrival date & time: 09/16/20  1722     History Psychosis    Alejandro Baker is a 45 y.o. male with history significant for schizoaffective disorder, diabetes who presents for evaluation under IVC.  Seen earlier today in behavioral health.  He is accompanied by law enforcement was voluntary.  Apparently he was picked up from a drugstore where he was harassing people.  While he was being evaluated by behavioral health was noted to be responding to internal stimuli.  Intermittently becomes agitated, cursing, pointing at wall talking to someone that is not present.  Patient was having features of paranoia, delusions, hallucinations.  Patient denies any complaints however does not answer most of my questions.  Level 5 caveat-Psychosis  HPI     Past Medical History:  Diagnosis Date  . Diabetes mellitus without complication (HCC)   . Hypertension   . Neuropathy   . Schizoaffective disorder, bipolar type [facility])     Patient Active Problem List   Diagnosis Date Noted  . Disorganized schizophrenia (HCC)     No past surgical history on file.     No family history on file.  Social History   Tobacco Use  . Smoking status: Current Every Day Smoker    Packs/day: 1.00    Years: 15.00    Pack years: 15.00  . Smokeless tobacco: Never Used  Substance Use Topics  . Alcohol use: Yes  . Drug use: Yes    Home Medications Prior to Admission medications   Not on File    Allergies    Penicillins and Povidone-iodine  Review of Systems   Review of Systems  Unable to perform ROS: Psychiatric disorder  All other systems reviewed and are negative.   Physical Exam Updated Vital Signs BP 107/76 (BP Location: Right Arm)   Pulse 81   Temp 97.9 F (36.6 C) (Oral)   Resp 20   SpO2 99%   Physical Exam Vitals and nursing note reviewed.  Constitutional:      General: He is not in acute distress.     Appearance: He is well-developed. He is not ill-appearing, toxic-appearing or diaphoretic.     Comments: Disheveled, malodorous  HENT:     Head: Atraumatic.  Eyes:     Pupils: Pupils are equal, round, and reactive to light.  Cardiovascular:     Rate and Rhythm: Normal rate and regular rhythm.  Pulmonary:     Effort: Pulmonary effort is normal. No respiratory distress.  Abdominal:     General: There is no distension.     Palpations: Abdomen is soft.  Musculoskeletal:        General: Normal range of motion.     Cervical back: Normal range of motion and neck supple.  Skin:    General: Skin is warm and dry.     Findings: Rash present.     Comments: Erythematous rash to entire body. No target lesions, bulla, desquamated skin. No intraoral lesion   Neurological:     Mental Status: He is alert.  Psychiatric:        Thought Content: Thought content is paranoid.     Comments: Appears to be looking around the room, question responding to internal stimuli.  Intermittently agitated.  Refuses to answer most of my questions.  Appears paranoid     ED Results / Procedures / Treatments   Labs (all labs ordered are listed, but only abnormal results  are displayed) Labs Reviewed  COMPREHENSIVE METABOLIC PANEL - Abnormal; Notable for the following components:      Result Value   Glucose, Bld 178 (*)    All other components within normal limits  SALICYLATE LEVEL - Abnormal; Notable for the following components:   Salicylate Lvl <7.0 (*)    All other components within normal limits  ACETAMINOPHEN LEVEL - Abnormal; Notable for the following components:   Acetaminophen (Tylenol), Serum <10 (*)    All other components within normal limits  RESP PANEL BY RT-PCR (FLU A&B, COVID) ARPGX2  ETHANOL  CBC WITH DIFFERENTIAL/PLATELET  RAPID URINE DRUG SCREEN, HOSP PERFORMED    EKG EKG Interpretation  Date/Time:  Friday Sep 16 2020 18:02:40 EDT Ventricular Rate:  67 PR Interval:  194 QRS  Duration: 112 QT Interval:  386 QTC Calculation: 407 R Axis:   73 Text Interpretation: Normal sinus rhythm Normal ECG Confirmed by Cathren Laine ([id number]) on 09/16/2020 7:38:50 PM   Radiology No results found.  Procedures Procedures   Medications Ordered in ED Medications  miconazole (MICOTIN) 2 % cream (has no administration in time range)    ED Course  I have reviewed the triage vital signs and the nursing notes.  Pertinent labs & imaging results that were available during my care of the patient were reviewed by me and considered in my medical decision making (see chart for details).  Patient presents for evaluation of psychosis under IVC taken out by behavioral health.  Initially presented there voluntarily however noted to be responding to internal stimuli, agitated, aggressive.  Psychiatry thought patient had active psychosis.  Sent here for medical clearance.  Plan on labs and reassess:  COVID, Flu negative EKG wo ischemic changes CBC wo changes CMP with glucose 178 no additional electrolyte, renal abnormality Ethanol, significant, salicylate within normal limit UDS pending however not change disposition EKG without ischemic changes  Will start benadryl for rash. Will treat with Miconazole  Patient medically cleared.  Currently under IVC  Hold orders placed    MDM Rules/Calculators/A&P                           Final Clinical Impression(s) / ED Diagnoses Final diagnoses:  Psychosis, unspecified psychosis type (HCC)  Rash    Rx / DC Orders ED Discharge Orders    None       Jossilyn Benda A, PA-C 09/16/20 2114    Cathren Laine, MD 09/16/20 2350

## 2020-09-16 NOTE — Progress Notes (Signed)
[employer] called to serve patient IVC paperwork and transport patient to [facility].

## 2020-09-16 NOTE — Discharge Instructions (Signed)
Discharge to [facility]

## 2020-09-16 NOTE — ED Triage Notes (Signed)
Pt to [facility] from [facility] by [employer]. Pt IVCd.

## 2020-09-16 NOTE — BH Assessment (Signed)
Pt to [facility] voluntarily with [employer]. Per [employer] pt was harassing staff at local drug store, responding to internal stimuli, saying that someone was "pinching his feet" and pointing finger guns. Reports hx of schizophrenia and hasn't been on medications for six months. Pt with abrupt movements as if he was throwing someone to the ground, shooting and pushing someone. Pt denies SI, HI, AVH.  Pt is urgent

## 2020-09-16 NOTE — BH Assessment (Signed)
Comprehensive Clinical Assessment (CCA) Note  09/16/2020 Alejandro Baker [id number]   Disposition: Per Reola Calkins, NP, patient is recommended for observation for safety and stabilization. Pt transferred to [facility] due to aggression.   Flowsheet Row ED from 09/16/2020 in [facility] ED from 09/03/2020 in [facility] ED from 08/27/2020 in [facility]  C-SSRS RISK CATEGORY No Risk No Risk No Risk      The patient demonstrates the following risk factors for suicide: Chronic risk factors for suicide include: psychiatric disorder of schizophrenia . Acute risk factors for suicide include: homeless. Protective factors for this patient include: none given. Considering these factors, the overall suicide risk at this point appears to be low. Patient is not appropriate for outpatient follow up.   Pt to [facility] voluntarily with [employer]. Per [employer] pt was harassing staff at local drug store, responding to internal stimuli, saying that someone was "pinching his feet" and pointing finger guns. Reports hx of schizophrenia and hasn't been on medications for six months. Pt with abrupt movements as if he was throwing someone to the ground, shooting and pushing someone. Pt denies SI, HI, AVH. Pt acknowledges [employer] picking him up because he was trespassing at the store. Pt reports he has been homeless unsheltered for about two years. Patient denies having a support system in the area and denies hx of enhanced services. Pt reports that he was in jail sometime ago for threatening someone with a weapon. Pt unable to tell when he was released from jail. Pt reports that he was dx with schizophrenia and was taking medications prescribed by Dr. Marney Setting in 2001 but he has been off medications for the past six months. Pt presents psychotic, with loose association, abrupted disorganized movement. When asked about his movements pt reports he is doing  exercises and denies experiencing AVH. Pt is calm during assessment but while meeting with NP pt became severely disorganized and aggressive, throwing objects in the room and cursing.    Chief Complaint:  Chief Complaint  Patient presents with  . Urgent Emergent Evaluation   Visit Diagnosis: Psychosis, unspecified psychosis type (HCC)    CCA Screening, Triage and Referral (STR)  Patient Reported Information How did you hear about [place]? Legal System  Referral name: [facility] Police  Referral phone number: No data recorded  Whom do you see for routine medical problems? I don't have a doctor  Practice/Facility Name: No data recorded Practice/Facility Phone Number: No data recorded Name of Contact: No data recorded Contact Number: No data recorded Contact Fax Number: No data recorded Prescriber Name: No data recorded Prescriber Address (if known): No data recorded  What Is the Reason for Your Visit/Call Today? Psychosis, disturbing staff at drug store  How Long Has This Been Causing You Problems? <Week  What Do You Feel Would Help You the Most Today? Treatment for Depression or other mood problem   Have You Recently Been in Any Inpatient Treatment (Hospital/Detox/Crisis Center/28-Day Program)? No  Name/Location of Program/Hospital:No data recorded How Long Were You There? No data recorded When Were You Discharged? No data recorded  Have You Ever Received Services From [facility] Before? Yes  Who Do You See at [facility]? Patient has been admitted to [facility] in the past and has been seen in the ED   Have You Recently Had Any Thoughts About Hurting Yourself? No  Are You Planning to Commit Suicide/Harm Yourself At This time? No   Have you Recently  Had Thoughts About Hurting Someone Karolee Ohslse? No  Explanation: No data recorded  Have You Used Any Alcohol or Drugs in the Past 24 Hours? No  How Long Ago Did You Use Drugs or Alcohol? 2300  What Did You Use and How Much?  UTA   Do You Currently Have a Therapist/Psychiatrist? No  Name of Therapist/Psychiatrist: No data recorded  Have You Been Recently Discharged From Any Office Practice or Programs? No  Explanation of Discharge From Practice/Program: No data recorded    CCA Screening Triage Referral Assessment Type of Contact: Face-to-Face  Is this Initial or Reassessment? No data recorded Date Telepsych consult ordered in [facility]:  No data recorded Time Telepsych consult ordered in [facility]:  No data recorded  Patient Reported Information Reviewed? Yes  Patient Left Without Being Seen? No data recorded Reason for Not Completing Assessment: No data recorded  Collateral Involvement: TTS contacted patient's mother Jackelyn KnifeCynthia Bowman [phone]   Does Patient Have a Court Appointed Legal Guardian? No data recorded Name and Contact of Legal Guardian: No data recorded If Minor and Not Living with Parent(s), Who has Custody? UTA  Is CPS involved or ever been involved? Never  Is APS involved or ever been involved? Never   Patient Determined To Be At Risk for Harm To Self or Others Based on Review of Patient Reported Information or Presenting Complaint? No  Method: No data recorded Availability of Means: No data recorded Intent: No data recorded Notification Required: No data recorded Additional Information for Danger to Others Potential: No data recorded Additional Comments for Danger to Others Potential: No data recorded Are There Guns or Other Weapons in Your Home? No data recorded Types of Guns/Weapons: No data recorded Are These Weapons Safely Secured?                            No data recorded Who Could Verify You Are Able To Have These Secured: No data recorded Do You Have any Outstanding Charges, Pending Court Dates, Parole/Probation? No data recorded Contacted To Inform of Risk of Harm To Self or Others: -- ([employer] brought him in to urgent care.)   [facility]   Does Patient Present under Involuntary Commitment? No  IVC Papers Initial File Date: No data recorded  [place]   Patient Currently Receiving the Following Services: Not Receiving Services   Determination of Need: Urgent (48 hours)   Options For Referral: Outpatient Therapy; Medication Management     CCA Biopsychosocial Intake/Chief Complaint:  Psychosis  Current Symptoms/Problems: None reported   Patient Reported Schizophrenia/Schizoaffective Diagnosis in Past: Yes   Strengths: NA  Preferences: NA  Abilities: NA   Type of Services Patient Feels are Needed: medications   Initial Clinical Notes/Concerns: calm and coopertive   Mental Health Symptoms Depression:  None   Duration of Depressive symptoms: Greater than two weeks   Mania:  None   Anxiety:   None   Psychosis:  Hallucinations; Grossly disorganized or catatonic behavior   Duration of Psychotic symptoms: Greater than six months   Trauma:  N/A   Obsessions:  N/A   Compulsions:  N/A   Inattention:  N/A   Hyperactivity/Impulsivity:  N/A   Oppositional/Defiant Behaviors:  None   Emotional Irregularity:  N/A   Other Mood/Personality Symptoms:  No data recorded   Mental Status Exam Appearance and self-care  Stature:  Average   Weight:  Average weight  Clothing:  Careless/inappropriate; Dirty; Disheveled   Grooming:  Neglected   Cosmetic use:  None   Posture/gait:  Tense; Rigid   Motor activity:  Agitated   Sensorium  Attention:  Distractible   Concentration:  Scattered   Orientation:  Place; Person; Situation   Recall/memory:  Defective in Immediate; Defective in Short-term   Affect and Mood  Affect:  Labile   Mood:  Hypomania   Relating  Eye contact:  Fleeting   Facial expression:  Anxious; Tense   Attitude toward examiner:  Cooperative; Hostile   Thought and Language  Speech flow: Pressured   Thought content:  Appropriate  to Mood and Circumstances   Preoccupation:  None   Hallucinations:  Visual   Organization:  No data recorded  [employer] of Knowledge:  Impoverished by (Comment)   Intelligence:  Average   Abstraction:  Normal   Judgement:  Poor   Reality Testing:  Distorted   Insight:  Lacking   Decision Making:  Confused   Social Functioning  Social Maturity:  Irresponsible   Social Judgement:  Heedless   Stress  Stressors:  Housing; Office manager Ability:  Human resources officer Deficits:  Activities of daily living; Responsibility   Supports:  Support needed     Religion:    Leisure/Recreation:    Exercise/Diet:     CCA Employment/Education Employment/Work Situation: Employment / Work Situation Employment situation: On disability Patient's job has been impacted by current illness: No What is the longest time patient has a held a job?: UTA 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 Has patient ever been in the [employer]?: No  Education: Education 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) Name of [employer]: Attended a high school in [place] Did You Graduate From [employer]?: No Did You Product manager?: No Did Designer, television/film set?: No Did You Have Any Special Interests In School?: UTA Did You Have An Individualized Education Program (IIEP): No Did You Have Any Difficulty At School?: No   CCA Family/Childhood History Family and Relationship History: Family history Are you sexually active?: No What is your sexual orientation?: UTA Has your sexual activity been affected by drugs, alcohol, medication, or emotional stress?: UTA Does patient have children?: No  Childhood History:  Childhood History By whom was/is the patient raised?: Mother Additional childhood history information: UTA Description of patient's relationship with caregiver when they were a child: UTA How were you disciplined when you got in trouble as a  child/adolescent?: UTA Did patient suffer any verbal/emotional/physical/sexual abuse as a child?: No Has patient ever been sexually abused/assaulted/raped as an adolescent or adult?: No Witnessed domestic violence?: No Has patient been affected by domestic violence as an adult?:  Industrial/product designer)  Child/Adolescent Assessment:     CCA Substance Use Alcohol/Drug Use: Alcohol / Drug Use Pain Medications: See MAR Prescriptions: See MAR Over the Counter: See MAR History of alcohol / drug use?: Yes (due to patient's psychosis, TTS was unable to obtain any information concerning his drug use) Longest period of sobriety (when/how long): UTA                         ASAM's:  Six Dimensions of Multidimensional Assessment  Dimension 1:  Acute Intoxication and/or Withdrawal Potential:   Dimension 1:  Description of individual's past and current experiences of substance use and withdrawal: due to patient's psychosis, TTS was unable to obtain any information concerning his drug  use  Dimension 2:  Biomedical Conditions and Complications:   Dimension 2:  Description of patient's biomedical conditions and  complications: due to patient's psychosis, TTS was unable to obtain any information concerning his drug use  Dimension 3:  Emotional, Behavioral, or Cognitive Conditions and Complications:  Dimension 3:  Description of emotional, behavioral, or cognitive conditions and complications: due to patient's psychosis, TTS was unable to obtain any information concerning his drug use  Dimension 4:  Readiness to Change:  Dimension 4:  Description of Readiness to Change criteria: due to patient's psychosis, TTS was unable to obtain any information concerning his drug use  Dimension 5:  Relapse, Continued use, or Continued Problem Potential:  Dimension 5:  Relapse, continued use, or continued problem potential critiera description: due to patient's psychosis, TTS was unable to obtain any information concerning his  drug use  Dimension 6:  Recovery/Living Environment:  Dimension 6:  Recovery/Iiving environment criteria description: due to patient's psychosis, TTS was unable to obtain any information concerning his drug use  ASAM Severity Score: ASAM's Severity Rating Score: 0  ASAM Recommended Level of Treatment:     Substance use Disorder (SUD) Substance Use Disorder (SUD)  Checklist Symptoms of Substance Use:  (due to patient's psychosis, TTS was unable to obtain any information concerning his drug use)  Recommendations for Services/Supports/Treatments: Recommendations for Services/Supports/Treatments Recommendations For Services/Supports/Treatments:  (due to patient's psychosis, TTS was unable to obtain any information concerning his drug use)  DSM5 Diagnoses: Patient Active Problem List   Diagnosis Date Noted  . Disorganized schizophrenia (HCC)       Referrals to Alternative Service(s): Referred to Alternative Service(s):   Place:   Date:   Time:    Referred to Alternative Service(s):   Place:   Date:   Time:    Referred to Alternative Service(s):   Place:   Date:   Time:    Referred to Alternative Service(s):   Place:   Date:   Time:     Disposition: Per Reola Calkins, NP, patient is recommended for observation for safety and stabilization. Pt transferred to [facility] due to aggression.   Ilse Billman Shirlee More, [facility]

## 2020-09-16 NOTE — Progress Notes (Signed)
Patient screaming, threatening staff, pushed over table in the assessment room. Patient given Ativan 2 mg, Haldol 5mg  and Benadryl 50mg  and continues to tear up assessment room, and threaten staff.  Patient is aggressive, angry and impulsive. Foods were offered to calm patient, patient took offered food and began to throw food all over the room as well. Patient is locked in assessment room with security present at door.  Nursing staff talked with provider, patient too acute to stay on open urgent care unit with other patients.

## 2020-09-16 NOTE — ED Provider Notes (Signed)
Behavioral Health Admission [facility]&P [facility] & OBS)  Date: 09/16/20 Patient Name: Alejandro Baker MRN: [id number] Chief Complaint:  Chief Complaint  Patient presents with  . Urgent Emergent Evaluation      Diagnoses:  Final diagnoses:  Psychosis, unspecified psychosis type [facility])    HPI: Patient is a 45 year old male who presented to the [facility] voluntarily accompanied by law enforcement.  It was reported that he was picked up from a drug store where he was harassing the staff there.  It is reported that he is banned from that location and the cops were called.  Patient had reported to the police that he had not been on his medications in approximately 6 months and needed some help and that he was having hallucinations.  Patient reported to TTS that he would like to restart his medications and during the evaluation presented to be responding to internal stimuli.  During my evaluation the patient has minimal conversation and becomes agitated and starts cursing me and pointing at the wall beside me talking to someone.  Patient did become agitated but then sat down and spoke with security.  Patient presents with psychotic features of paranoia, delusions, visual and auditory hallucinations.  Patient reported to me that he is not taking any medications but will take medications if we provide them to him.  Patient then starts cursing and yelling again and responding to internal stimuli. Reviewed patient's chart and in 04/08/2019 patient was admitted to [facility] medical and was discharged on Invega 6 mg p.o. daily, metformin 500 mg p.o. twice daily, and trazodone 200 mg p.o. nightly.  Due to the patient's presentation and will prescribe patient agitation protocol of Haldol p.o. or IM every 6 hours as needed, Ativan p.o. or IM every 6 hours as needed, and Benadryl p.o. every 6 hours as needed.  Patient was restarted on Risperdal 2 mg p.o. twice daily. Chart review also indicates that patient has a long history  of mental health disorders which included schizophrenia, schizoaffective disorder, substance-induced mood disorder.  Patient also reported to be malingering and at one point was documented that patient has no psychiatric diagnosis.  PHQ 2-9:   Flowsheet Row ED from 09/03/2020 in [facility] from 08/27/2020 in [facility] from 08/18/2020 in [facility] EMERGENCY DEPARTMENT  C-SSRS RISK CATEGORY No Risk No Risk No Risk       Total Time spent with patient: 30 minutes  Musculoskeletal  Strength & Muscle Tone: within normal limits Gait & Station: normal Patient leans: N/A  Psychiatric Specialty Exam  Presentation General Appearance: Disheveled  Eye Contact:Fleeting  Speech:Clear and Coherent  Speech Volume:Increased  Handedness:Right   Mood and Affect  Mood:Labile  Affect:Full Range   Thought Process  Thought Processes:Disorganized; Irrevelant  Descriptions of Associations:Circumstantial  Orientation:Partial  Thought Content:Illogical; Paranoid Ideation; Delusions  Diagnosis of Schizophrenia or Schizoaffective disorder in past: Yes  Duration of Psychotic Symptoms: Greater than six months  Hallucinations:Hallucinations: Auditory; Visual Description of Auditory Hallucinations: Not able to describe, but responding during evaluation Description of Visual Hallucinations: Not able to describe, but responding during evaluation  Ideas of Reference:Delusions; Paranoia  Suicidal Thoughts:Suicidal Thoughts: No  Homicidal Thoughts:Homicidal Thoughts: No   Sensorium  Memory:Immediate Poor; Recent Poor; Remote Poor  Judgment:Impaired  Insight:Lacking   Executive Functions  Concentration:Poor  Attention Span:Poor  Recall:Poor  Fund of Knowledge:Poor  Language:Fair   Psychomotor Activity  Psychomotor Activity:Psychomotor Activity: Increased   Assets  Assets:Resilience   Sleep  Sleep:Sleep: -- (Would not answer)   Nutritional Assessment (For OBS and FBC admissions only) Has the patient had a weight loss or gain of 10 pounds or more in the last 3 months?: No Has the patient had a decrease in food intake/or appetite?: No Does the patient have dental problems?: No Does the patient have eating habits or behaviors that may be indicators of an eating disorder including binging or inducing vomiting?: No Has the patient recently lost weight without trying?: No Has the patient been eating poorly because of a decreased appetite?: No Malnutrition Screening Tool Score: 0    Physical Exam Vitals and nursing note reviewed.  Constitutional:      Appearance: He is well-developed.  HENT:     Head: Normocephalic.  Eyes:     Pupils: Pupils are equal, round, and reactive to light.  Cardiovascular:     Rate and Rhythm: Normal rate.  Pulmonary:     Effort: Pulmonary effort is normal.  Musculoskeletal:        General: Normal range of motion.  Neurological:     Mental Status: He is alert and oriented to person, place, and time.  Psychiatric:        Attention and Perception: He perceives auditory and visual hallucinations.        Mood and Affect: Affect is labile and inappropriate.        Behavior: Behavior is agitated.        Thought Content: Thought content is paranoid and delusional.        Cognition and Memory: Memory is impaired.        Judgment: Judgment is impulsive and inappropriate.    Review of Systems  Constitutional: Negative.   HENT: Negative.   Eyes: Negative.   Respiratory: Negative.   Cardiovascular: Negative.   Gastrointestinal: Negative.   Genitourinary: Negative.   Musculoskeletal: Negative.   Skin: Negative.   Neurological: Negative.   Endo/Heme/Allergies: Negative.     Blood pressure (!) 130/96, pulse (!) 112, temperature 98.6 F (37 C), temperature source Oral, resp. rate 20, SpO2 98 %. There is no height  or weight on file to calculate BMI.  Past Psychiatric History: Malingering, Schizophrenia, schizoaffective, malingering, substance induced mood, previous hospitalizations   Is the patient at risk to self? Yes  Has the patient been a risk to self in the past 6 months? Yes .    Has the patient been a risk to self within the distant past? Yes   Is the patient a risk to others? Yes   Has the patient been a risk to others in the past 6 months? No   Has the patient been a risk to others within the distant past? No   Past Medical History:  Past Medical History:  Diagnosis Date  . Diabetes mellitus without complication (HCC)   . Hypertension   . Neuropathy   . Schizoaffective disorder, bipolar type (HCC)    No past surgical history on file.  Family History: No family history on file.  Social History:  Social History   Socioeconomic History  . Marital status: Single    Spouse name: Not on file  . Number of children: Not on file  . Years of education: Not on file  . Highest education level: Not on file  Occupational History  . Not on file  Tobacco Use  . Smoking status: Current Every Day Smoker    Packs/day: 1.00    Years: 15.00  Pack years: 15.00  . Smokeless tobacco: Never Used  Substance and Sexual Activity  . Alcohol use: Yes  . Drug use: Yes  . Sexual activity: Not on file  Other Topics Concern  . Not on file  Social History Narrative   ** Merged History Encounter **       ** Merged History Encounter **       ** Merged History Encounter **       Social Determinants of Corporate investment banker Strain: Not on file  Food Insecurity: Not on file  Transportation Needs: Not on file  Physical Activity: Not on file  Stress: Not on file  Social Connections: Not on file  Intimate Partner Violence: Not on file    SDOH:  SDOH Screenings   Alcohol Screen: Not on file  Depression (PHQ2-9): Not on file  Financial Resource Strain: Not on file  Food Insecurity:  Not on file  Housing: Not on file  Physical Activity: Not on file  Social Connections: Not on file  Stress: Not on file  Tobacco Use: High Risk  . Smoking Tobacco Use: Current Every Day Smoker  . Smokeless Tobacco Use: Never Used  Transportation Needs: Not on file    Last Labs:  Admission on 07/25/2020, Discharged on 07/26/2020  Component Date Value Ref Range Status  . SARS Coronavirus 2 by RT PCR 07/25/2020 NEGATIVE  NEGATIVE Final   Comment: (NOTE) SARS-CoV-2 target nucleic acids are NOT DETECTED.  The SARS-CoV-2 RNA is generally detectable in upper respiratory specimens during the acute phase of infection. The lowest concentration of SARS-CoV-2 viral copies this assay can detect is 138 copies/mL. A negative result does not preclude SARS-Cov-2 infection and should not be used as the sole basis for treatment or other patient management decisions. A negative result may occur with  improper specimen collection/handling, submission of specimen other than nasopharyngeal swab, presence of viral mutation(s) within the areas targeted by this assay, and inadequate number of viral copies(<138 copies/mL). A negative result must be combined with clinical observations, patient history, and epidemiological information. The expected result is Negative.  Fact Sheet for Patients:  BloggerCourse.com  Fact Sheet for Healthcare Providers:  SeriousBroker.it  This test is no                          t yet approved or cleared by the [place] FDA and  has been authorized for detection and/or diagnosis of SARS-CoV-2 by FDA under an Emergency Use Authorization (EUA). This EUA will remain  in effect (meaning this test can be used) for the duration of the COVID-19 declaration under Section 564(b)(1) of the Act, 21 U.S.C.section 360bbb-3(b)(1), unless the authorization is terminated  or revoked sooner.      . Influenza A by PCR 07/25/2020  NEGATIVE  NEGATIVE Final  . Influenza B by PCR 07/25/2020 NEGATIVE  NEGATIVE Final   Comment: (NOTE) The Xpert Xpress SARS-CoV-2/FLU/RSV plus assay is intended as an aid in the diagnosis of influenza from Nasopharyngeal swab specimens and should not be used as a sole basis for treatment. Nasal washings and aspirates are unacceptable for Xpert Xpress SARS-CoV-2/FLU/RSV testing.  Fact Sheet for Patients: BloggerCourse.com  Fact Sheet for Healthcare Providers: SeriousBroker.it  This test is not yet approved or cleared by the [place] FDA and has been authorized for detection and/or diagnosis of SARS-CoV-2 by FDA under an Emergency Use Authorization (EUA). This EUA will remain in effect (meaning this  test can be used) for the duration of the COVID-19 declaration under Section 564(b)(1) of the Act, 21 U.S.C. section 360bbb-3(b)(1), unless the authorization is terminated or revoked.  Performed at [facility], [address], [place], [place] [address]   . Sodium 07/25/2020 137  135 - 145 mmol/L Final  . Potassium 07/25/2020 3.9  3.5 - 5.1 mmol/L Final  . Chloride 07/25/2020 103  98 - 111 mmol/L Final  . CO2 07/25/2020 26  22 - 32 mmol/L Final  . Glucose, Bld 07/25/2020 338* 70 - 99 mg/dL Final   Glucose reference range applies only to samples taken after fasting for at least 8 hours.  . BUN 07/25/2020 14  6 - 20 mg/dL Final  . Creatinine, Ser 07/25/2020 0.81  0.61 - 1.24 mg/dL Final  . Calcium 19/14/7829 9.2  8.9 - 10.3 mg/dL Final  . Total Protein 07/25/2020 6.9  6.5 - 8.1 g/dL Final  . Albumin 56/21/3086 3.8  3.5 - 5.0 g/dL Final  . AST 57/84/6962 22  15 - 41 U/L Final  . ALT 07/25/2020 24  0 - 44 U/L Final  . Alkaline Phosphatase 07/25/2020 69  38 - 126 U/L Final  . Total Bilirubin 07/25/2020 0.8  0.3 - 1.2 mg/dL Final  . GFR, Estimated 07/25/2020 >60  >60 mL/min Final   Comment: (NOTE) Calculated using the CKD-EPI  Creatinine Equation (2021)   . Anion gap 07/25/2020 8  5 - 15 Final   Performed at [facility], [address] [place], [place] [address]  . Alcohol, Ethyl (B) 07/25/2020 <10  <10 mg/dL Final   Comment: (NOTE) Lowest detectable limit for serum alcohol is 10 mg/dL.  For medical purposes only. Performed at [facility], [address] [place], [place] [address]   . WBC 07/25/2020 8.2  4.0 - 10.5 K/uL Final  . RBC 07/25/2020 4.95  4.22 - 5.81 MIL/uL Final  . Hemoglobin 07/25/2020 15.4  13.0 - 17.0 g/dL Final  . HCT 05/02/7251 44.7  39.0 - 52.0 % Final  . MCV 07/25/2020 90.3  80.0 - 100.0 fL Final  . MCH 07/25/2020 31.1  26.0 - 34.0 pg Final  . MCHC 07/25/2020 34.5  30.0 - 36.0 g/dL Final  . RDW 66/44/0347 13.2  11.5 - 15.5 % Final  . Platelets 07/25/2020 330  150 - 400 K/uL Final  . nRBC 07/25/2020 0.0  0.0 - 0.2 % Final  . Neutrophils Relative % 07/25/2020 60  % Final  . Neutro Abs 07/25/2020 4.9  1.7 - 7.7 K/uL Final  . Lymphocytes Relative 07/25/2020 31  % Final  . Lymphs Abs 07/25/2020 2.5  0.7 - 4.0 K/uL Final  . Monocytes Relative 07/25/2020 7  % Final  . Monocytes Absolute 07/25/2020 0.6  0.1 - 1.0 K/uL Final  . Eosinophils Relative 07/25/2020 1  % Final  . Eosinophils Absolute 07/25/2020 0.1  0.0 - 0.5 K/uL Final  . Basophils Relative 07/25/2020 1  % Final  . Basophils Absolute 07/25/2020 0.1  0.0 - 0.1 K/uL Final  . Immature Granulocytes 07/25/2020 0  % Final  . Abs Immature Granulocytes 07/25/2020 0.03  0.00 - 0.07 K/uL Final   Performed at [facility], [address] [place], [place] [address]  . Acetaminophen (Tylenol), Serum 07/25/2020 <10* 10 - 30 ug/mL Final   Comment: (NOTE) Therapeutic concentrations vary significantly. A range of 10-30 ug/mL  may be an effective concentration for many patients. However, some  are best treated at concentrations outside of this range. Acetaminophen concentrations >150 ug/mL at 4 hours after ingestion  and  >50 ug/mL at 12 hours after ingestion are often associated with  toxic reactions.  Performed at [facility], [address], [place], [place] [address]   . Salicylate Lvl 07/25/2020 <7.0* 7.0 - 30.0 mg/dL Final   Performed at [facility], [address], [place], [place] [address]  Admission on 07/07/2020, Discharged on 07/08/2020  Component Date Value Ref Range Status  . SARS Coronavirus 2 by RT PCR 07/07/2020 NEGATIVE  NEGATIVE Final   Comment: (NOTE) SARS-CoV-2 target nucleic acids are NOT DETECTED.  The SARS-CoV-2 RNA is generally detectable in upper respiratory specimens during the acute phase of infection. The lowest concentration of SARS-CoV-2 viral copies this assay can detect is 138 copies/mL. A negative result does not preclude SARS-Cov-2 infection and should not be used as the sole basis for treatment or other patient management decisions. A negative result may occur with  improper specimen collection/handling, submission of specimen other than nasopharyngeal swab, presence of viral mutation(s) within the areas targeted by this assay, and inadequate number of viral copies(<138 copies/mL). A negative result must be combined with clinical observations, patient history, and epidemiological information. The expected result is Negative.  Fact Sheet for Patients:  BloggerCourse.com  Fact Sheet for Healthcare Providers:  SeriousBroker.it  This test is no                          t yet approved or cleared by the [place] FDA and  has been authorized for detection and/or diagnosis of SARS-CoV-2 by FDA under an Emergency Use Authorization (EUA). This EUA will remain  in effect (meaning this test can be used) for the duration of the COVID-19 declaration under Section 564(b)(1) of the Act, 21 U.S.C.section 360bbb-3(b)(1), unless the authorization is terminated  or revoked sooner.      . Influenza A by PCR  07/07/2020 NEGATIVE  NEGATIVE Final  . Influenza B by PCR 07/07/2020 NEGATIVE  NEGATIVE Final   Comment: (NOTE) The Xpert Xpress SARS-CoV-2/FLU/RSV plus assay is intended as an aid in the diagnosis of influenza from Nasopharyngeal swab specimens and should not be used as a sole basis for treatment. Nasal washings and aspirates are unacceptable for Xpert Xpress SARS-CoV-2/FLU/RSV testing.  Fact Sheet for Patients: BloggerCourse.com  Fact Sheet for Healthcare Providers: SeriousBroker.it  This test is not yet approved or cleared by the [place] FDA and has been authorized for detection and/or diagnosis of SARS-CoV-2 by FDA under an Emergency Use Authorization (EUA). This EUA will remain in effect (meaning this test can be used) for the duration of the COVID-19 declaration under Section 564(b)(1) of the Act, 21 U.S.C. section 360bbb-3(b)(1), unless the authorization is terminated or revoked.  Performed at [facility], [address] [place], [place] [address]   . SARS Coronavirus 2 Ag 07/07/2020 Negative  Negative Final  . WBC 07/07/2020 6.3  4.0 - 10.5 K/uL Final  . RBC 07/07/2020 5.03  4.22 - 5.81 MIL/uL Final  . Hemoglobin 07/07/2020 15.4  13.0 - 17.0 g/dL Final  . HCT 91/47/8295 45.0  39.0 - 52.0 % Final  . MCV 07/07/2020 89.5  80.0 - 100.0 fL Final  . MCH 07/07/2020 30.6  26.0 - 34.0 pg Final  . MCHC 07/07/2020 34.2  30.0 - 36.0 g/dL Final  . RDW 62/13/0865 13.6  11.5 -  15.5 % Final  . Platelets 07/07/2020 347  150 - 400 K/uL Final  . nRBC 07/07/2020 0.0  0.0 - 0.2 % Final  . Neutrophils Relative % 07/07/2020 58  % Final  . Neutro Abs 07/07/2020 3.7  1.7 - 7.7 K/uL Final  . Lymphocytes Relative 07/07/2020 31  % Final  . Lymphs Abs 07/07/2020 1.9  0.7 - 4.0 K/uL Final  . Monocytes Relative 07/07/2020 9  % Final  . Monocytes Absolute 07/07/2020 0.6  0.1 - 1.0 K/uL Final  . Eosinophils Relative 07/07/2020 1  %  Final  . Eosinophils Absolute 07/07/2020 0.1  0.0 - 0.5 K/uL Final  . Basophils Relative 07/07/2020 1  % Final  . Basophils Absolute 07/07/2020 0.0  0.0 - 0.1 K/uL Final  . Immature Granulocytes 07/07/2020 0  % Final  . Abs Immature Granulocytes 07/07/2020 0.02  0.00 - 0.07 K/uL Final   Performed at [facility], [address] [place], [place] [address]  . Sodium 07/07/2020 133* 135 - 145 mmol/L Final  . Potassium 07/07/2020 4.0  3.5 - 5.1 mmol/L Final  . Chloride 07/07/2020 95* 98 - 111 mmol/L Final  . CO2 07/07/2020 27  22 - 32 mmol/L Final  . Glucose, Bld 07/07/2020 294* 70 - 99 mg/dL Final   Glucose reference range applies only to samples taken after fasting for at least 8 hours.  . BUN 07/07/2020 11  6 - 20 mg/dL Final  . Creatinine, Ser 07/07/2020 0.69  0.61 - 1.24 mg/dL Final  . Calcium 60/45/409803/01/2021 9.4  8.9 - 10.3 mg/dL Final  . Total Protein 07/07/2020 7.0  6.5 - 8.1 g/dL Final  . Albumin 11/91/478203/01/2021 4.1  3.5 - 5.0 g/dL Final  . AST 95/62/130803/01/2021 22  15 - 41 U/L Final  . ALT 07/07/2020 24  0 - 44 U/L Final  . Alkaline Phosphatase 07/07/2020 83  38 - 126 U/L Final  . Total Bilirubin 07/07/2020 0.6  0.3 - 1.2 mg/dL Final  . GFR, Estimated 07/07/2020 >60  >60 mL/min Final   Comment: (NOTE) Calculated using the CKD-EPI Creatinine Equation (2021)   . Anion gap 07/07/2020 11  5 - 15 Final   Performed at [facility], [address] [place], [place] [address]  . Cholesterol 07/07/2020 150  0 - 200 mg/dL Final  . Triglycerides 07/07/2020 92  <150 mg/dL Final  . HDL 69/62/952803/01/2021 63  >40 mg/dL Final  . Total CHOL/HDL Ratio 07/07/2020 2.4  RATIO Final  . VLDL 07/07/2020 18  0 - 40 mg/dL Final  . LDL Cholesterol 07/07/2020 69  0 - 99 mg/dL Final   Comment:        Total Cholesterol/HDL:CHD Risk Coronary Heart Disease Risk Table                     Men   Women  1/2 Average Risk   3.4   3.3  Average Risk       5.0   4.4  2 X Average Risk   9.6   7.1  3 X Average Risk  23.4    11.0        Use the calculated Patient Ratio above and the CHD Risk Table to determine the patient's CHD Risk.        ATP III CLASSIFICATION (LDL):  <100     mg/dL   Optimal  [id number]  mg/dL   Near or Above  Optimal  130-159  mg/dL   Borderline  161-096  mg/dL   High  >045     mg/dL   Very High Performed at [facility], [address] [place], [place] [address]   . TSH 07/07/2020 0.862  0.350 - 4.500 uIU/mL Final   Comment: Performed by a 3rd Generation assay with a functional sensitivity of <=0.01 uIU/mL. Performed at [facility], [address], [place], [place] [address]   . Prolactin 07/07/2020 2.2* 4.0 - 15.2 ng/mL Final   Comment: (NOTE) Performed At: [facility] [address] [place], [place] [id number] Jolene Schimke MD QM:[id number]   . Alcohol, Ethyl (B) 07/07/2020 <10  <10 mg/dL Final   Comment: (NOTE) Lowest detectable limit for serum alcohol is 10 mg/dL.  For medical purposes only. Performed at [facility], [address] [place], [place] [address]   Admission on 06/17/2020, Discharged on 06/18/2020  Component Date Value Ref Range Status  . Sodium 06/17/2020 133* 135 - 145 mmol/L Final  . Potassium 06/17/2020 4.1  3.5 - 5.1 mmol/L Final  . Chloride 06/17/2020 98  98 - 111 mmol/L Final  . CO2 06/17/2020 22  22 - 32 mmol/L Final  . Glucose, Bld 06/17/2020 295* 70 - 99 mg/dL Final   Glucose reference range applies only to samples taken after fasting for at least 8 hours.  . BUN 06/17/2020 14  6 - 20 mg/dL Final  . Creatinine, Ser 06/17/2020 1.02  0.61 - 1.24 mg/dL Final  . Calcium 24/40/1027 9.2  8.9 - 10.3 mg/dL Final  . Total Protein 06/17/2020 7.2  6.5 - 8.1 g/dL Final  . Albumin 25/36/6440 4.1  3.5 - 5.0 g/dL Final  . AST 34/74/2595 27  15 - 41 U/L Final  . ALT 06/17/2020 26  0 - 44 U/L Final  . Alkaline Phosphatase 06/17/2020 77  38 - 126 U/L Final  . Total Bilirubin 06/17/2020 1.5* 0.3 - 1.2 mg/dL Final   . GFR, Estimated 06/17/2020 >60  >60 mL/min Final   Comment: (NOTE) Calculated using the CKD-EPI Creatinine Equation (2021)   . Anion gap 06/17/2020 13  5 - 15 Final   Performed at [facility], [address], [place], [place] [address]  . WBC 06/17/2020 8.5  4.0 - 10.5 K/uL Final  . RBC 06/17/2020 5.35  4.22 - 5.81 MIL/uL Final  . Hemoglobin 06/17/2020 15.6  13.0 - 17.0 g/dL Final  . HCT 64/33/2951 47.9  39.0 - 52.0 % Final  . MCV 06/17/2020 89.5  80.0 - 100.0 fL Final  . MCH 06/17/2020 29.2  26.0 - 34.0 pg Final  . MCHC 06/17/2020 32.6  30.0 - 36.0 g/dL Final  . RDW 88/41/6606 13.6  11.5 - 15.5 % Final  . Platelets 06/17/2020 330  150 - 400 K/uL Final  . nRBC 06/17/2020 0.0  0.0 - 0.2 % Final  . Neutrophils Relative % 06/17/2020 61  % Final  . Neutro Abs 06/17/2020 5.2  1.7 - 7.7 K/uL Final  . Lymphocytes Relative 06/17/2020 29  % Final  . Lymphs Abs 06/17/2020 2.5  0.7 - 4.0 K/uL Final  . Monocytes Relative 06/17/2020 8  % Final  . Monocytes Absolute 06/17/2020 0.7  0.1 - 1.0 K/uL Final  . Eosinophils Relative 06/17/2020 1  % Final  . Eosinophils Absolute 06/17/2020 0.1  0.0 - 0.5 K/uL Final  . Basophils Relative 06/17/2020 1  % Final  . Basophils Absolute 06/17/2020 0.0  0.0 - 0.1 K/uL Final  . Immature Granulocytes 06/17/2020 0  % Final  . Abs Immature Granulocytes 06/17/2020 0.03  0.00 - 0.07 K/uL Final   Performed at [facility], [address], [place], [place] [address]  . Alcohol, Ethyl (B) 06/17/2020 <10  <10 mg/dL Final   Comment: (NOTE) Lowest detectable limit for serum alcohol is 10 mg/dL.  For medical purposes only. Performed at [facility], [address] [place], [place] [address]   Admission on 06/16/2020, Discharged on 06/16/2020  Component Date Value Ref Range Status  . Glucose-Capillary 06/16/2020 256* 70 - 99 mg/dL Final   Glucose reference range applies only to samples taken after fasting for at least 8 hours.  Admission on  06/05/2020, Discharged on 06/05/2020  Component Date Value Ref Range Status  . SARS Coronavirus 2 by RT PCR 06/05/2020 NEGATIVE  NEGATIVE Final   Comment: (NOTE) SARS-CoV-2 target nucleic acids are NOT DETECTED.  The SARS-CoV-2 RNA is generally detectable in upper respiratory specimens during the acute phase of infection. The lowest concentration of SARS-CoV-2 viral copies this assay can detect is 138 copies/mL. A negative result does not preclude SARS-Cov-2 infection and should not be used as the sole basis for treatment or other patient management decisions. A negative result may occur with  improper specimen collection/handling, submission of specimen other than nasopharyngeal swab, presence of viral mutation(s) within the areas targeted by this assay, and inadequate number of viral copies(<138 copies/mL). A negative result must be combined with clinical observations, patient history, and epidemiological information. The expected result is Negative.  Fact Sheet for Patients:  BloggerCourse.com  Fact Sheet for Healthcare Providers:  SeriousBroker.it  This test is no                          t yet approved or cleared by the [place] FDA and  has been authorized for detection and/or diagnosis of SARS-CoV-2 by FDA under an Emergency Use Authorization (EUA). This EUA will remain  in effect (meaning this test can be used) for the duration of the COVID-19 declaration under Section 564(b)(1) of the Act, 21 U.S.C.section 360bbb-3(b)(1), unless the authorization is terminated  or revoked sooner.      . Influenza A by PCR 06/05/2020 NEGATIVE  NEGATIVE Final  . Influenza B by PCR 06/05/2020 NEGATIVE  NEGATIVE Final   Comment: (NOTE) The Xpert Xpress SARS-CoV-2/FLU/RSV plus assay is intended as an aid in the diagnosis of influenza from Nasopharyngeal swab specimens and should not be used as a sole basis for treatment. Nasal washings  and aspirates are unacceptable for Xpert Xpress SARS-CoV-2/FLU/RSV testing.  Fact Sheet for Patients: BloggerCourse.com  Fact Sheet for Healthcare Providers: SeriousBroker.it  This test is not yet approved or cleared by the [place] FDA and has been authorized for detection and/or diagnosis of SARS-CoV-2 by FDA under an Emergency Use Authorization (EUA). This EUA will remain in effect (meaning this test can be used) for the duration of the COVID-19 declaration under Section 564(b)(1) of the Act, 21 U.S.C. section 360bbb-3(b)(1), unless the authorization is terminated or revoked.  Performed at [facility], [address] [place], [place] [address]   . SARS Coronavirus 2 Ag 06/05/2020 Negative  Negative Preliminary  . WBC 06/05/2020 9.1  4.0 - 10.5 K/uL Final  . RBC 06/05/2020 4.67  4.22 - 5.81 MIL/uL Final  . Hemoglobin 06/05/2020 14.2  13.0 - 17.0 g/dL Final  .  HCT 06/05/2020 41.1  39.0 - 52.0 % Final  . MCV 06/05/2020 88.0  80.0 - 100.0 fL Final  . MCH 06/05/2020 30.4  26.0 - 34.0 pg Final  . MCHC 06/05/2020 34.5  30.0 - 36.0 g/dL Final  . RDW 78/58/8502 12.9  11.5 - 15.5 % Final  . Platelets 06/05/2020 326  150 - 400 K/uL Final  . nRBC 06/05/2020 0.0  0.0 - 0.2 % Final  . Neutrophils Relative % 06/05/2020 56  % Final  . Neutro Abs 06/05/2020 5.1  1.7 - 7.7 K/uL Final  . Lymphocytes Relative 06/05/2020 35  % Final  . Lymphs Abs 06/05/2020 3.2  0.7 - 4.0 K/uL Final  . Monocytes Relative 06/05/2020 7  % Final  . Monocytes Absolute 06/05/2020 0.6  0.1 - 1.0 K/uL Final  . Eosinophils Relative 06/05/2020 1  % Final  . Eosinophils Absolute 06/05/2020 0.1  0.0 - 0.5 K/uL Final  . Basophils Relative 06/05/2020 1  % Final  . Basophils Absolute 06/05/2020 0.1  0.0 - 0.1 K/uL Final  . Immature Granulocytes 06/05/2020 0  % Final  . Abs Immature Granulocytes 06/05/2020 0.03  0.00 - 0.07 K/uL Final   Performed at [facility], [address], [place], [place] [address]  . Sodium 06/05/2020 138  135 - 145 mmol/L Final  . Potassium 06/05/2020 3.8  3.5 - 5.1 mmol/L Final  . Chloride 06/05/2020 102  98 - 111 mmol/L Final  . CO2 06/05/2020 23  22 - 32 mmol/L Final  . Glucose, Bld 06/05/2020 332* 70 - 99 mg/dL Final   Glucose reference range applies only to samples taken after fasting for at least 8 hours.  . BUN 06/05/2020 12  6 - 20 mg/dL Final  . Creatinine, Ser 06/05/2020 0.80  0.61 - 1.24 mg/dL Final  . Calcium 87/86/7672 9.5  8.9 - 10.3 mg/dL Final  . Total Protein 06/05/2020 6.8  6.5 - 8.1 g/dL Final  . Albumin 09/47/0962 3.9  3.5 - 5.0 g/dL Final  . AST 83/66/2947 21  15 - 41 U/L Final  . ALT 06/05/2020 25  0 - 44 U/L Final  . Alkaline Phosphatase 06/05/2020 62  38 - 126 U/L Final  . Total Bilirubin 06/05/2020 0.5  0.3 - 1.2 mg/dL Final  . GFR, Estimated 06/05/2020 >60  >60 mL/min Final   Comment: (NOTE) Calculated using the CKD-EPI Creatinine Equation (2021)   . Anion gap 06/05/2020 13  5 - 15 Final   Performed at [facility], [address], [place], [place] [address]  . Hgb A1c MFr Bld 06/05/2020 10.7* 4.8 - 5.6 % Final   Comment: (NOTE) Pre diabetes:          5.7%-6.4%  Diabetes:              >6.4%  Glycemic control for   <7.0% adults with diabetes   . Mean Plasma Glucose 06/05/2020 260.39  mg/dL Final   Performed at [facility], [address], [place], [place] [address]  . Alcohol, Ethyl (B) 06/05/2020 <10  <10 mg/dL Final   Comment: (NOTE) Lowest detectable limit for serum alcohol is 10 mg/dL.  For medical purposes only. Performed at [facility], [address] [place], [place] [address]   . TSH 06/05/2020 0.815  0.350 - 4.500 uIU/mL Final   Comment: Performed by a 3rd Generation assay with a functional sensitivity of <=0.01 uIU/mL. Performed at [facility], [address] [place], [place] [address]   . SARS Coronavirus 2 Ag 06/05/2020 NEGATIVE   NEGATIVE Final   Comment: (NOTE) SARS-CoV-2 antigen NOT DETECTED.   Negative results are presumptive.  Negative results do not preclude SARS-CoV-2 infection and should not be used as the sole basis for treatment or other patient management decisions, including infection  control decisions, particularly in the presence of clinical signs and  symptoms consistent with COVID-19, or in those who have been in contact with the virus.  Negative results must be combined with clinical observations, patient history, and epidemiological information. The expected result is Negative.  Fact Sheet for Patients: https://www.jennings-kim.com/  Fact Sheet for Healthcare Providers: https://alexander-rogers.biz/  This test is not yet approved or cleared by the [place] FDA and  has been authorized for detection and/or diagnosis of SARS-CoV-2 by FDA under an Emergency Use Authorization (EUA).  This EUA will remain in effect (meaning this test can be used) for the duration of  the COV                          ID-19 declaration under Section 564(b)(1) of the Act, 21 U.S.C. section 360bbb-3(b)(1), unless the authorization is terminated or revoked sooner.    . Cholesterol 06/05/2020 140  0 - 200 mg/dL Final  . Triglycerides 06/05/2020 115  <150 mg/dL Final  . HDL 57/84/6962 56  >40 mg/dL Final  . Total CHOL/HDL Ratio 06/05/2020 2.5  RATIO Final  . VLDL 06/05/2020 23  0 - 40 mg/dL Final  . LDL Cholesterol 06/05/2020 61  0 - 99 mg/dL Final   Comment:        Total Cholesterol/HDL:CHD Risk Coronary Heart Disease Risk Table                     Men   Women  1/2 Average Risk   3.4   3.3  Average Risk       5.0   4.4  2 X Average Risk   9.6   7.1  3 X Average Risk  23.4   11.0        Use the calculated Patient Ratio above and the CHD Risk Table to determine the patient's CHD Risk.        ATP III CLASSIFICATION (LDL):  <100     mg/dL   Optimal  952-841  mg/dL   Near or  Above                    Optimal  130-159  mg/dL   Borderline  324-401  mg/dL   High  >027     mg/dL   Very High Performed at [facility], [address] [place], [place] [address]   Admission on 06/02/2020, Discharged on 06/02/2020  Component Date Value Ref Range Status  . Glucose-Capillary 06/02/2020 362* 70 - 99 mg/dL Final   Glucose reference range applies only to samples taken after fasting for at least 8 hours.  Admission on 03/20/2020, Discharged on 03/20/2020  Component Date Value Ref Range Status  . Glucose-Capillary 03/20/2020 357* 70 - 99 mg/dL Final   Glucose reference range applies only to samples taken after fasting for at least 8 hours.  . Sodium 03/20/2020 135  135 - 145 mmol/L Final  . Potassium 03/20/2020 5.0  3.5 - 5.1 mmol/L Final  . Chloride 03/20/2020 97* 98 - 111 mmol/L Final  . CO2 03/20/2020 26  22 - 32 mmol/L Final  . Glucose, Bld 03/20/2020 414* 70 - 99 mg/dL Final   Glucose reference range applies only to samples taken after fasting for at least 8 hours.  . BUN 03/20/2020 18  6 - 20 mg/dL Final  . Creatinine, Ser 03/20/2020 1.14  0.61 - 1.24 mg/dL Final  . Calcium 16/01/9603 9.2  8.9 - 10.3 mg/dL Final  . Total Protein 03/20/2020 7.7  6.5 - 8.1 g/dL Final  . Albumin 54/12/8117 4.1  3.5 - 5.0 g/dL Final  . AST 14/78/2956 34  15 - 41 U/L Final  . ALT 03/20/2020 24  0 - 44 U/L Final  . Alkaline Phosphatase 03/20/2020 90  38 - 126 U/L Final  . Total Bilirubin 03/20/2020 1.1  0.3 - 1.2 mg/dL Final  . GFR, Estimated 03/20/2020 >60  >60 mL/min Final   Comment: (NOTE) Calculated using the CKD-EPI Creatinine Equation (2021)   . Anion gap 03/20/2020 12  5 - 15 Final   Performed at [facility], [address], [place], [place] [address]    Allergies: Penicillins and Povidone-iodine  PTA Medications: (Not in a hospital admission)   Medical Decision Making  Covid and labs ordered Agitation protocol ordered Start Risperdal 2 mg  PO BID Start Metformin 500 mg PO BID with meals    Recommendations  Based on my evaluation the patient does not appear to have an emergency medical condition.  Gerlene Burdock Dameian Crisman, FNP 09/16/20  2:45 PM

## 2020-09-16 NOTE — Progress Notes (Signed)
Pt currently sleeping on the floor in the assessment room. Respirations are even and unlabored. No signs of acute distress noted. Staff will monitor for pt's safety.
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
## 2020-09-17 DIAGNOSIS — F250 Schizoaffective disorder, bipolar type: Secondary | ICD-10-CM | POA: Diagnosis present

## 2020-09-17 DIAGNOSIS — F1999 Other psychoactive substance use, unspecified with unspecified psychoactive substance-induced disorder: Secondary | ICD-10-CM | POA: Diagnosis present

## 2020-09-17 DIAGNOSIS — F1910 Other psychoactive substance abuse, uncomplicated: Secondary | ICD-10-CM | POA: Diagnosis present

## 2020-09-17 LAB — RAPID URINE DRUG SCREEN, HOSP PERFORMED
Amphetamines: NOT DETECTED
Barbiturates: NOT DETECTED
Benzodiazepines: POSITIVE — AB
Cocaine: NOT DETECTED
Opiates: NOT DETECTED
Tetrahydrocannabinol: NOT DETECTED

## 2020-09-17 MED ORDER — RISPERIDONE 2 MG PO TABS
2.0000 mg | ORAL_TABLET | Freq: Every day | ORAL | Status: DC
  Administered 2020-09-17: 2 mg via ORAL
  Filled 2020-09-17: qty 1

## 2020-09-17 NOTE — Consult Note (Addendum)
Telepsych Consultation   Reason for Consult:  psychosis Referring Physician:  Melbourne Abts PA-C Location of Patient: [id number] Location of Provider: Behavioral Health TTS Department  Patient Identification: Jontue Crumpacker MRN:  [id number] Principal Diagnosis: Substance-induced disorder [facility]) Diagnosis:  Principal Problem:   Substance-induced disorder (HCC) Active Problems:   Polysubstance abuse (HCC)   Schizoaffective disorder, bipolar type (HCC)   Total Time spent with patient: 20 minutes  Subjective:   Eustace Hur is a 45 y.o. male patient admitted with substance induced disorder.  0830: Patient initially presents laying in bed refusing to wake up during RN prompts; RN attempted to physically arouse patient who then stated, "If you ever touch me again I'll kill you". Provider attempted to redirect patient who then sat up silently and began sticking up his middle finger at the screen making several obscene hand gestures. Patient refusing to engage in assessment. Does not appear to be responding to any external/internal stimuli at this time. He refused UDS; BAL<10. States he was bought in for a poison ivy rash on his back but is now ready to go because he "has somewhere to be" and disconnected the call.   1751: Patient presents calm and cooperative. Apologetic for previous encounter. Patient states "All I need is shoes", says [place] "spits out rocks and gravel so I'm walking on rocks all day". Patient states he normally lives in [place]. Patient states he was diagnosed with schizophrenia "a long time ago"; denies any current outpatient services or treatment, says he is not interested in any services at this time. He provides minimal information on circumstances that led to his presentation on yesterday; he does admit to "drinking and smoking earlier in the day". UDS+ Benzos, BAL<10. Per chart review, patient has presented to ED, [facility] (03/10)  with similar presentation where he was diagnosed with substance induced psychotic disorder. He is requesting a shower at this time "so I can get on my way" and asking to be discharged.   Patient is calm and cooperative, alert and oriented at the time of the assessment. He denies any suicidal or homicidal ideations, auditory or visual hallucinations, and does not appear to be actively psychotic or responding to any external/internal stimuli. Patient contracts for safety and states he has somewhere to live upon discharge. Provider discussed and provided information on [facility] services for outpatient treatment.   HPI:   Erasmus Bistline is a 45 y.o. male who presented to [facility] under IVC from [facility] via [employer] from drug store. He has a past history significant for schizoaffective disorder, diabetes. He was picked up from a drugstore where he was harassing people. While being evaluated by behavioral health pt was noted to be responding to internal stimuli and intermittently becoming agitated, cursing, pointing at wall talking to someone that is not present.  Patient was having features of paranoia, delusions, hallucinations.  Patient denies any complaints however does not answer most of my questions. Refused UDS; BAL <10.   Past Psychiatric History:   -polysubstance abuse  -substance induced psychotic disorder  -schizoaffective disorder  Risk to Self:  pt denies Risk to Others:  pt denies Prior Inpatient Therapy:  yes Prior Outpatient Therapy:  yes  Past Medical History:  Past Medical History:  Diagnosis Date  . Diabetes mellitus without complication (HCC)   . Hypertension   . Neuropathy   . Schizoaffective disorder, bipolar type (HCC)    No past surgical history on file. Family  History: No family history on file. Family Psychiatric  History: not noted Social History:  Social History   Substance and Sexual Activity  Alcohol Use Yes     Social History   Substance and Sexual Activity  Drug Use Yes     Social History   Socioeconomic History  . Marital status: Single    Spouse name: Not on file  . Number of children: Not on file  . Years of education: Not on file  . Highest education level: Not on file  Occupational History  . Not on file  Tobacco Use  . Smoking status: Current Every Day Smoker    Packs/day: 1.00    Years: 15.00    Pack years: 15.00  . Smokeless tobacco: Never Used  Substance and Sexual Activity  . Alcohol use: Yes  . Drug use: Yes  . Sexual activity: Not on file  Other Topics Concern  . Not on file  Social History Narrative   ** Merged History Encounter **       ** Merged History Encounter **       ** Merged History Encounter **       Social Determinants of Corporate investment banker Strain: Not on file  Food Insecurity: Not on file  Transportation Needs: Not on file  Physical Activity: Not on file  Stress: Not on file  Social Connections: Not on file   Additional Social History:    Allergies:   Allergies  Allergen Reactions  . Penicillins   . Povidone-Iodine Rash    Labs:  Results for orders placed or performed during the hospital encounter of 09/16/20 (from the past 48 hour(s))  Resp Panel by RT-PCR (Flu A&B, Covid) Nasopharyngeal Swab     Status: None   Collection Time: 09/16/20  5:45 PM   Specimen: Nasopharyngeal Swab; Nasopharyngeal(NP) swabs in vial transport medium  Result Value Ref Range   SARS Coronavirus 2 by RT PCR NEGATIVE NEGATIVE    Comment: (NOTE) SARS-CoV-2 target nucleic acids are NOT DETECTED.  The SARS-CoV-2 RNA is generally detectable in upper respiratory specimens during the acute phase of infection. The lowest concentration of SARS-CoV-2 viral copies this assay can detect is 138 copies/mL. A negative result does not preclude SARS-Cov-2 infection and should not be used as the sole basis for treatment or other patient management decisions. A negative result may occur with  improper specimen  collection/handling, submission of specimen other than nasopharyngeal swab, presence of viral mutation(s) within the areas targeted by this assay, and inadequate number of viral copies(<138 copies/mL). A negative result must be combined with clinical observations, patient history, and epidemiological information. The expected result is Negative.  Fact Sheet for Patients:  BloggerCourse.com  Fact Sheet for Healthcare Providers:  SeriousBroker.it  This test is no t yet approved or cleared by the [place] FDA and  has been authorized for detection and/or diagnosis of SARS-CoV-2 by FDA under an Emergency Use Authorization (EUA). This EUA will remain  in effect (meaning this test can be used) for the duration of the COVID-19 declaration under Section 564(b)(1) of the Act, 21 U.S.C.section 360bbb-3(b)(1), unless the authorization is terminated  or revoked sooner.       Influenza A by PCR NEGATIVE NEGATIVE   Influenza B by PCR NEGATIVE NEGATIVE    Comment: (NOTE) The Xpert Xpress SARS-CoV-2/FLU/RSV plus assay is intended as an aid in the diagnosis of influenza from Nasopharyngeal swab specimens and should not be used as a sole basis  for treatment. Nasal washings and aspirates are unacceptable for Xpert Xpress SARS-CoV-2/FLU/RSV testing.  Fact Sheet for Patients: BloggerCourse.com  Fact Sheet for Healthcare Providers: SeriousBroker.it  This test is not yet approved or cleared by the [place] FDA and has been authorized for detection and/or diagnosis of SARS-CoV-2 by FDA under an Emergency Use Authorization (EUA). This EUA will remain in effect (meaning this test can be used) for the duration of the COVID-19 declaration under Section 564(b)(1) of the Act, 21 U.S.C. section 360bbb-3(b)(1), unless the authorization is terminated or revoked.  Performed at [facility], [address], [place], [place] [address]   Comprehensive metabolic panel     Status: Abnormal   Collection Time: 09/16/20  7:50 PM  Result Value Ref Range   Sodium 138 135 - 145 mmol/L   Potassium 4.1 3.5 - 5.1 mmol/L   Chloride 104 98 - 111 mmol/L   CO2 27 22 - 32 mmol/L   Glucose, Bld 178 (H) 70 - 99 mg/dL    Comment: Glucose reference range applies only to samples taken after fasting for at least 8 hours.   BUN 17 6 - 20 mg/dL   Creatinine, Ser 3.54 0.61 - 1.24 mg/dL   Calcium 9.1 8.9 - 56.2 mg/dL   Total Protein 7.1 6.5 - 8.1 g/dL   Albumin 3.9 3.5 - 5.0 g/dL   AST 20 15 - 41 U/L   ALT 23 0 - 44 U/L   Alkaline Phosphatase 71 38 - 126 U/L   Total Bilirubin 0.4 0.3 - 1.2 mg/dL   GFR, Estimated >56 >38 mL/min    Comment: (NOTE) Calculated using the CKD-EPI Creatinine Equation (2021)    Anion gap 7 5 - 15    Comment: Performed at [facility], [address], [place], [place] [address]  Ethanol     Status: None   Collection Time: 09/16/20  7:50 PM  Result Value Ref Range   Alcohol, Ethyl (B) <10 <10 mg/dL    Comment: (NOTE) Lowest detectable limit for serum alcohol is 10 mg/dL.  For medical purposes only. Performed at [facility], [address], [place], [place] [address]   CBC with Diff     Status: None   Collection Time: 09/16/20  7:50 PM  Result Value Ref Range   WBC 8.1 4.0 - 10.5 K/uL   RBC 5.25 4.22 - 5.81 MIL/uL   Hemoglobin 15.8 13.0 - 17.0 g/dL   HCT 11.5 72.6 - 20.3 %   MCV 90.9 80.0 - 100.0 fL   MCH 30.1 26.0 - 34.0 pg   MCHC 33.1 30.0 - 36.0 g/dL   RDW 55.9 74.1 - 63.8 %   Platelets 275 150 - 400 K/uL   nRBC 0.0 0.0 - 0.2 %   Neutrophils Relative % 49 %   Neutro Abs 4.0 1.7 - 7.7 K/uL   Lymphocytes Relative 39 %   Lymphs Abs 3.1 0.7 - 4.0 K/uL   Monocytes Relative 8 %   Monocytes Absolute 0.7 0.1 - 1.0 K/uL   Eosinophils Relative 3 %   Eosinophils Absolute 0.2 0.0 - 0.5 K/uL   Basophils  Relative 1 %   Basophils Absolute 0.1 0.0 - 0.1 K/uL   Immature Granulocytes 0 %   Abs Immature Granulocytes 0.02 0.00 - 0.07 K/uL    Comment: Performed at [facility], [address], [place], [place] [address]  Salicylate level     Status: Abnormal  Collection Time: 09/16/20  7:50 PM  Result Value Ref Range   Salicylate Lvl <7.0 (L) 7.0 - 30.0 mg/dL    Comment: Performed at [facility], [address] [place], [place] [address]  Acetaminophen level     Status: Abnormal   Collection Time: 09/16/20  7:50 PM  Result Value Ref Range   Acetaminophen (Tylenol), Serum <10 (L) 10 - 30 ug/mL    Comment: (NOTE) Therapeutic concentrations vary significantly. A range of 10-30 ug/mL  may be an effective concentration for many patients. However, some  are best treated at concentrations outside of this range. Acetaminophen concentrations >150 ug/mL at 4 hours after ingestion  and >50 ug/mL at 12 hours after ingestion are often associated with  toxic reactions.  Performed at [facility], [address] [place], [place] [address]   Urine rapid drug screen (hosp performed)     Status: Abnormal   Collection Time: 09/17/20  7:00 AM  Result Value Ref Range   Opiates NONE DETECTED NONE DETECTED   Cocaine NONE DETECTED NONE DETECTED   Benzodiazepines POSITIVE (A) NONE DETECTED   Amphetamines NONE DETECTED NONE DETECTED   Tetrahydrocannabinol NONE DETECTED NONE DETECTED   Barbiturates NONE DETECTED NONE DETECTED    Comment: (NOTE) DRUG SCREEN FOR MEDICAL PURPOSES ONLY.  IF CONFIRMATION IS NEEDED FOR ANY PURPOSE, NOTIFY LAB WITHIN 5 DAYS.  LOWEST DETECTABLE LIMITS FOR URINE DRUG SCREEN Drug Class                     Cutoff (ng/mL) Amphetamine and metabolites    1000 Barbiturate and metabolites    200 Benzodiazepine                 200 Tricyclics and metabolites     300 Opiates and metabolites        300 Cocaine and metabolites         300 THC                            50 Performed at [facility], [address] [place], [place] [address]     Medications:  Current Facility-Administered Medications  Medication Dose Route Frequency Provider Last Rate Last Admin  . miconazole (MICOTIN) 2 % cream   Topical BID Henderly, Britni A, PA-C      . risperiDONE (RISPERDAL) tablet 2 mg  2 mg Oral Daily Leevy-Johnson, Nianna Igo A, NP       No current outpatient medications on file.    Musculoskeletal: Strength & Muscle Tone: within normal limits Gait & Station: normal Patient leans: N/A  Psychiatric Specialty Exam: Physical Exam Vitals and nursing note reviewed.  Psychiatric:        Attention and Perception: Attention and perception normal.        Mood and Affect: Affect is blunt.        Speech: Speech is tangential.        Behavior: Behavior is cooperative.        Thought Content: Thought content normal.        Judgment: Judgment is impulsive.     Review of Systems  Psychiatric/Behavioral: Positive for behavioral problems. Negative for hallucinations, self-injury and suicidal ideas.  All other systems reviewed and are negative.   Blood pressure 122/89, pulse (!) 103, temperature 98.4 F (36.9 C), temperature source Oral, resp. rate 16, SpO2 99 %.There is no height or weight on  file to calculate BMI.  General Appearance: Disheveled  Eye Contact:  Fair  Speech:  Clear and Coherent  Volume:  Normal  Mood:  Dysphoric  Affect:  Congruent  Thought Process:  Goal Directed  Orientation:  Full (Time, Place, and Person)  Thought Content:  Logical and Tangential  Suicidal Thoughts:  No  Homicidal Thoughts:  No  Memory:  Immediate;   Fair Recent;   Fair  Judgement:  Other:  impulsive  Insight:  Shallow  Psychomotor Activity:  Normal  Concentration:  Concentration: Fair and Attention Span: Fair  Recall:  [employer] of Knowledge:  Fair  Language:  Fair  Akathisia:  NA  Handed:    AIMS (if  indicated):     Assets:  Manufacturing systems engineer Housing Physical Health Resilience  ADL's:  Intact  Cognition:  WNL  Sleep:      Treatment Plan Summary: Plan discharge patient home with outpatient resources for individual follow-up.   Disposition: No evidence of imminent risk to self or others at present.   Patient does not meet criteria for psychiatric inpatient admission. Discussed crisis plan, support from social network, calling 911, coming to the Emergency Department, and calling Suicide Hotline.  This service was provided via telemedicine using a 2-way, interactive audio and video technology.  Names of all persons participating in this telemedicine service and their role in this encounter. Name: Maxie Barb Role: PMHNP  Name: Nelly Rout Role: Attending MD  Name: [name] Role: patient  Name:  Role:     Loletta Parish, NP 09/17/2020 10:53 AM

## 2020-09-17 NOTE — ED Provider Notes (Addendum)
12:30 PM -he has been seen by psychiatry twice this morning and medically cleared.  At this time he is standing in the doorway of his room, ambulating normally.  He does not appear to be responding to internal stimuli.  He is alert and conversant.  The patient is currently cooperative and respectful.   Mancel Bale, MD 09/17/20 1248  The IVC was rescinded by me   Mancel Bale, MD 09/17/20 1250

## 2020-09-17 NOTE — ED Notes (Signed)
Gave pt bus pass and escorted him to the front doors of the Emergency Department

## 2020-09-17 NOTE — ED Notes (Signed)
Pt uncooperative with TTS assessment. He raised his middle finger to the monitor multiple times and turned away from the monitor. I encouraged him to participate multiple times, but he refused.

## 2020-09-17 NOTE — Discharge Instructions (Signed)
Follow-up with your psychiatric therapist and psychiatrist for further care as needed.  You can go to the behavioral health urgent care anytime as needed for problems.

## 2020-09-19 ENCOUNTER — Telehealth (HOSPITAL_COMMUNITY): Payer: Self-pay | Admitting: Emergency Medicine

## 2020-09-19 NOTE — BH Assessment (Signed)
Care Management - Follow Up [facility] Discharges   Writer attempted to make contact with patient today and was unsuccessful.  Patient does not have a phone number listed in epic.

## 2021-04-07 ENCOUNTER — Other Ambulatory Visit: Payer: Self-pay

## 2021-04-07 ENCOUNTER — Emergency Department (HOSPITAL_COMMUNITY)
Admission: EM | Admit: 2021-04-07 | Discharge: 2021-04-08 | Disposition: A | Discharge status: Home / Self Care | Payer: Medicare Other | Attending: Emergency Medicine | Admitting: Emergency Medicine

## 2021-04-07 DIAGNOSIS — M542 Cervicalgia: Secondary | ICD-10-CM | POA: Insufficient documentation

## 2021-04-07 DIAGNOSIS — R7309 Other abnormal glucose: Secondary | ICD-10-CM | POA: Insufficient documentation

## 2021-04-07 DIAGNOSIS — R5383 Other fatigue: Secondary | ICD-10-CM | POA: Diagnosis not present

## 2021-04-07 DIAGNOSIS — Z5321 Procedure and treatment not carried out due to patient leaving prior to being seen by health care provider: Secondary | ICD-10-CM | POA: Diagnosis not present

## 2021-04-07 LAB — CBG MONITORING, ED: Glucose-Capillary: 192 mg/dL — ABNORMAL HIGH (ref 70–99)

## 2021-04-07 MED ORDER — NAPROXEN 250 MG PO TABS: 500.0000 mg | ORAL_TABLET | Freq: Once | ORAL | Status: DC

## 2021-04-07 NOTE — ED Triage Notes (Signed)
BIB EMS for mid neck pain sudden onset 10/10 about 2 hours ago.  Pt lethargic and yawning in triage.

## 2021-04-07 NOTE — ED Provider Notes (Signed)
Emergency Medicine Provider Triage Evaluation Note  Alejandro Baker , a 45 y.o. male  was evaluated in triage.  Pt complains of constant atraumatic neck pain x 2 hours. No known inciting factors, alleviating factors. Did not take any medications PTA. Feels "like it is going to pop off". Denies fevers, IVDU.   Review of Systems  Positive: As above Negative: As above  Physical Exam  BP (!) 107/91 (BP Location: Right Arm)   Pulse (!) 106   Temp 98.6 F (37 C) (Oral)   Resp 20   SpO2 99%  Gen:   Awake, no distress.  Disheveled appearing.  Suspicion for homelessness. Resp:  Normal effort  MSK:   Moves extremities without difficulty  Other:  No bony deformities, step-offs, crepitus to the cervical midline.  Equal grip strength bilaterally with 5/5 strength against resistance in all major muscle groups of bilateral upper extremities.  Medical Decision Making  Medically screening exam initiated at 11:11 PM.  Appropriate orders placed.  Ryle Buscemi was informed that the remainder of the evaluation will be completed by another provider, this initial triage assessment does not replace that evaluation, and the importance of remaining in the ED until their evaluation is complete.  Atraumatic neck pain   Antony Madura, PA-C 04/07/21 2313    Jacalyn Lefevre, MD 04/07/21 2317

## 2021-04-08 NOTE — ED Notes (Signed)
No answer from pt in waiting room 

## 2021-04-14 ENCOUNTER — Encounter (HOSPITAL_COMMUNITY): Payer: Self-pay

## 2021-04-14 ENCOUNTER — Emergency Department (HOSPITAL_COMMUNITY)
Admission: EM | Admit: 2021-04-14 | Discharge: 2021-04-14 | Discharge status: Against Medical Advice | Payer: Medicare Other | Attending: Emergency Medicine | Admitting: Emergency Medicine

## 2021-04-14 ENCOUNTER — Emergency Department (HOSPITAL_COMMUNITY)
Admission: EM | Admit: 2021-04-14 | Discharge: 2021-04-14 | Disposition: A | Discharge status: Home / Self Care | Payer: Medicare Other | Attending: Emergency Medicine | Admitting: Emergency Medicine

## 2021-04-14 ENCOUNTER — Other Ambulatory Visit: Payer: Self-pay

## 2021-04-14 DIAGNOSIS — I10 Essential (primary) hypertension: Secondary | ICD-10-CM | POA: Diagnosis not present

## 2021-04-14 DIAGNOSIS — M542 Cervicalgia: Secondary | ICD-10-CM | POA: Insufficient documentation

## 2021-04-14 DIAGNOSIS — F17210 Nicotine dependence, cigarettes, uncomplicated: Secondary | ICD-10-CM | POA: Insufficient documentation

## 2021-04-14 DIAGNOSIS — R519 Headache, unspecified: Secondary | ICD-10-CM | POA: Diagnosis not present

## 2021-04-14 DIAGNOSIS — E119 Type 2 diabetes mellitus without complications: Secondary | ICD-10-CM | POA: Insufficient documentation

## 2021-04-14 LAB — CBG MONITORING, ED: Glucose-Capillary: 172 mg/dL — ABNORMAL HIGH (ref 70–99)

## 2021-04-14 MED ORDER — PERMETHRIN 5 % EX CREA
TOPICAL_CREAM | Freq: Once | CUTANEOUS | Status: DC
  Filled 2021-04-14: qty 60

## 2021-04-14 MED ORDER — PERMETHRIN 1 % EX LOTN
TOPICAL_LOTION | Freq: Once | CUTANEOUS | Status: DC
  Filled 2021-04-14: qty 59

## 2021-04-14 NOTE — ED Triage Notes (Signed)
Pt. C/o migraine and neck injury. Will not articulate any further.

## 2021-04-14 NOTE — ED Notes (Addendum)
Pt's mother left cash for pt. Cash is locked up in lock box. Key for box is in folder slot for room 23.

## 2021-04-14 NOTE — ED Provider Notes (Signed)
[facility]-EMERGENCY DEPT Provider Note   CSN: [id number] Arrival date & time: 04/14/21  0631     History Chief Complaint  Patient presents with   Migraine   Neck Injury    Alejandro Baker is a 45 y.o. male.  He presented with a complaint of headache and neck pain.  Patient currently sleeping and difficult to arouse.  Reportedly had lice and bedbugs and was given a shower and shave prior to me seeing him.   11:45 AM.  Patient is awake and alert.  He is getting dressed and he tells me to fuck off and is wanting to leave.  He denies any suicidal ideation.  The history is provided by the patient.  Headache Pain location:  Generalized Quality:  Dull Radiates to:  L neck and R neck Onset quality:  Gradual Duration:  2 days Timing:  Intermittent Progression:  Unchanged Chronicity:  Recurrent Similar to prior headaches: yes   Relieved by:  Nothing Worsened by:  Nothing Ineffective treatments:  None tried Associated symptoms: no abdominal pain, no cough, no fever, no loss of balance, no vomiting and no weakness       Past Medical History:  Diagnosis Date   Diabetes mellitus without complication (HCC)    Hypertension    Neuropathy    Schizoaffective disorder, bipolar type [facility])     Patient Active Problem List   Diagnosis Date Noted   Polysubstance abuse (HCC) 09/17/2020   Schizoaffective disorder, bipolar type (HCC)    Substance-induced disorder (HCC)    Disorganized schizophrenia (HCC)     History reviewed. No pertinent surgical history.     History reviewed. No pertinent family history.  Social History   Tobacco Use   Smoking status: Every Day    Packs/day: 1.00    Years: 15.00    Pack years: 15.00    Types: Cigarettes   Smokeless tobacco: Never  Substance Use Topics   Alcohol use: Yes   Drug use: Yes    Home Medications Prior to Admission medications   Not on File    Allergies    Penicillins and Povidone-iodine  Review of  Systems   Review of Systems  Constitutional:  Negative for fever.  Respiratory:  Negative for cough.   Gastrointestinal:  Negative for abdominal pain and vomiting.  Neurological:  Positive for headaches. Negative for weakness and loss of balance.   Physical Exam Updated Vital Signs BP 100/64    Pulse 89    Temp (!) 97.4 F (36.3 C) (Oral)    Resp 18    SpO2 94%   Physical Exam Vitals and nursing note reviewed.  Constitutional:      General: He is not in acute distress.    Appearance: Normal appearance. He is well-developed.  HENT:     Head: Normocephalic and atraumatic.  Eyes:     Conjunctiva/sclera: Conjunctivae normal.  Cardiovascular:     Rate and Rhythm: Normal rate and regular rhythm.     Heart sounds: No murmur heard. Pulmonary:     Effort: Pulmonary effort is normal. No respiratory distress.     Breath sounds: Normal breath sounds.  Abdominal:     Palpations: Abdomen is soft.     Tenderness: There is no abdominal tenderness. There is no guarding or rebound.  Musculoskeletal:        General: No swelling.     Cervical back: Neck supple.  Skin:    General: Skin is warm and dry.  Capillary Refill: Capillary refill takes less than 2 seconds.  Neurological:     General: No focal deficit present.  Psychiatric:        Mood and Affect: Mood normal.    ED Results / Procedures / Treatments   Labs (all labs ordered are listed, but only abnormal results are displayed) Labs Reviewed - No data to display  EKG None  Radiology No results found.  Procedures Procedures   Medications Ordered in ED Medications - No data to display  ED Course  I have reviewed the triage vital signs and the nursing notes.  Pertinent labs & imaging results that were available during my care of the patient were reviewed by me and considered in my medical decision making (see chart for details).  Clinical Course as of 04/14/21 1143  Fri Apr 14, 2021  1030 Reassessed patient.  He  remains sleepy minimally arousable to voice or stimulation.  Not able to answer questions.  Mother is here now in the room and states he is off his psych meds. [MB]    Clinical Course User Index [MB] Terrilee Files, MD   MDM Rules/Calculators/A&P                           Patient is declining any lab work and is demanding to be discharged.  Ambulatory without any difficulty.  Final Clinical Impression(s) / ED Diagnoses Final diagnoses:  Neck pain  Generalized headache    Rx / DC Orders ED Discharge Orders     None        Terrilee Files, MD 04/14/21 1755

## 2021-04-14 NOTE — ED Notes (Signed)
Pt is resting/sleeping in room and will not answer questions from ED staff.

## 2021-04-14 NOTE — ED Notes (Signed)
When going into the room to try and get a response from the pt nurse asked him if he is willing to to talk to the doctor now. He said he doesn't want to talk to the doctor says that "nothing is f*cking wrong with me," and demanded breakfast.

## 2021-04-14 NOTE — ED Notes (Signed)
Pt is in the back getting a shower.

## 2021-04-14 NOTE — ED Notes (Addendum)
Pt left with personal belongings and also had cash returned.

## 2021-04-19 ENCOUNTER — Emergency Department (HOSPITAL_COMMUNITY)
Admission: EM | Admit: 2021-04-19 | Discharge: 2021-04-19 | Disposition: A | Discharge status: Home / Self Care | Payer: Medicare Other | Attending: Student | Admitting: Student

## 2021-04-19 DIAGNOSIS — F17210 Nicotine dependence, cigarettes, uncomplicated: Secondary | ICD-10-CM | POA: Diagnosis not present

## 2021-04-19 DIAGNOSIS — M25512 Pain in left shoulder: Secondary | ICD-10-CM | POA: Diagnosis not present

## 2021-04-19 DIAGNOSIS — R519 Headache, unspecified: Secondary | ICD-10-CM | POA: Insufficient documentation

## 2021-04-19 DIAGNOSIS — M79672 Pain in left foot: Secondary | ICD-10-CM | POA: Diagnosis not present

## 2021-04-19 DIAGNOSIS — E119 Type 2 diabetes mellitus without complications: Secondary | ICD-10-CM | POA: Diagnosis not present

## 2021-04-19 DIAGNOSIS — I10 Essential (primary) hypertension: Secondary | ICD-10-CM | POA: Insufficient documentation

## 2021-04-19 MED ORDER — ACETAMINOPHEN 500 MG PO TABS
1000.0000 mg | ORAL_TABLET | Freq: Once | ORAL | Status: DC
  Filled 2021-04-19: qty 2

## 2021-04-19 NOTE — ED Triage Notes (Signed)
Patient presents with left shoulder and foot pain. He is unsure of how long he has had this pain. He denies any trauma to EMS.   EMS vitals: 138/84 BP 108 HR 18 RR

## 2021-04-19 NOTE — ED Provider Notes (Addendum)
[facility] DEPT Provider Note   CSN: [id number] Arrival date & time: 04/19/21  2043     History Chief Complaint  Patient presents with   Shoulder Pain   Foot Pain    Blayke Hlavka is a 45 y.o. male with a history of diabetes mellitus, hypertension, schizoaffective disorder.  Presents emergency department with a chief complaint of headache.  Patient reports headache x1 hour.  States that pain is located to occipital region.  Patient states that "it feels like my head is going to pop off."  Patient denies taking anything that alleviate his pain.  Denies any aggravating factors.  Patient denies any falls or traumatic injuries.  Patient also states that he had "twitching," underneath his left armpit which lasted for 10 to 12 seconds.  Denies any pain there at present.  Per triage note patient complains of left shoulder pain and foot pain.  Was unable to provide any further details about this pain.  Patient endorses drinking "2 cups," of wine today.  Denies any illicit drug use.   Shoulder Pain Associated symptoms: no back pain, no fever and no neck pain   Foot Pain Associated symptoms include headaches. Pertinent negatives include no chest pain, no abdominal pain and no shortness of breath.      Past Medical History:  Diagnosis Date   Diabetes mellitus without complication ([place])    Hypertension    Neuropathy    Schizoaffective disorder, bipolar type [facility])     Patient Active Problem List   Diagnosis Date Noted   Polysubstance abuse ([place]) 09/17/2020   Schizoaffective disorder, bipolar type ([place])    Substance-induced disorder ([place])    Disorganized schizophrenia ([place])     No past surgical history on file.     No family history on file.  Social History   Tobacco Use   Smoking status: Every Day    Packs/day: 1.00    Years: 15.00    Pack years: 15.00    Types: Cigarettes   Smokeless tobacco: Never  Substance Use Topics   Alcohol use:  Yes   Drug use: Yes    Home Medications Prior to Admission medications   Not on File    Allergies    Penicillins and Povidone-iodine  Review of Systems   Review of Systems  Constitutional:  Negative for chills and fever.  HENT:  Negative for facial swelling.   Eyes:  Negative for visual disturbance.  Respiratory:  Negative for shortness of breath.   Cardiovascular:  Negative for chest pain.  Gastrointestinal:  Negative for abdominal pain, nausea and vomiting.  Musculoskeletal:  Negative for back pain and neck pain.  Skin:  Negative for color change and rash.  Neurological:  Positive for headaches. Negative for dizziness, tremors, seizures, syncope, facial asymmetry, speech difficulty, weakness, light-headedness and numbness.  Psychiatric/Behavioral:  Negative for confusion.    Physical Exam Updated Vital Signs BP 104/68    Pulse 72    Temp 97.6 F (36.4 C) (Oral)    Resp 18    Ht 5\' 11"  (1.803 m)    Wt 77.1 kg    SpO2 98%    BMI 23.71 kg/m    Physical Exam Vitals and nursing note reviewed.  Constitutional:      General: He is not in acute distress.    Appearance: He is not ill-appearing, toxic-appearing or diaphoretic.     Comments: Patient appears disheveled with poor hygiene and dirty clothes.  HENT:  Head: Normocephalic and atraumatic. No raccoon eyes, Battle's sign, abrasion, contusion, right periorbital erythema or left periorbital erythema.  Eyes:     General: No scleral icterus.       Right eye: No discharge.        Left eye: No discharge.     Extraocular Movements: Extraocular movements intact.     Pupils: Pupils are equal, round, and reactive to light.  Cardiovascular:     Rate and Rhythm: Normal rate.  Pulmonary:     Effort: Pulmonary effort is normal.  Abdominal:     General: Abdomen is flat.     Palpations: Abdomen is soft. There is no mass or pulsatile mass.     Tenderness: There is no abdominal tenderness. There is no guarding or rebound.   Musculoskeletal:     Cervical back: Normal range of motion and neck supple. No edema, erythema, signs of trauma, rigidity, torticollis or crepitus. No pain with movement, spinous process tenderness or muscular tenderness. Normal range of motion.     Comments: No midline tenderness or deformity to cervical, thoracic, or lumbar spine.  No tenderness, point tenderness, or deformity to bilateral upper or lower extremities.  Skin:    General: Skin is warm and dry.  Neurological:     General: No focal deficit present.     Mental Status: He is alert.     GCS: GCS eye subscore is 4. GCS verbal subscore is 5. GCS motor subscore is 6.     Cranial Nerves: No cranial nerve deficit, dysarthria or facial asymmetry.     Motor: No weakness, tremor, seizure activity or pronator drift.     Coordination: Finger-Nose-Finger Test normal.     Gait: Gait is intact. Gait normal.     Comments: CN II-XII intact, equal grip strength, +5 strength to bilateral upper and lower extremities   Psychiatric:        Behavior: Behavior is uncooperative.    ED Results / Procedures / Treatments   Labs (all labs ordered are listed, but only abnormal results are displayed) Labs Reviewed - No data to display  EKG None  Radiology No results found.  Procedures Procedures   Medications Ordered in ED Medications  acetaminophen (TYLENOL) tablet 1,000 mg (has no administration in time range)    ED Course  I have reviewed the triage vital signs and the nursing notes.  Pertinent labs & imaging results that were available during my care of the patient were reviewed by me and considered in my medical decision making (see chart for details).    MDM Rules/Calculators/A&P                          Alert 45 year old male no acute stress, nontoxic-appearing.  Patient has covering his head and arms with his sweatshirt.  Patient is uncooperative and refuses to remove his head or arms from his sweatshirt.  Eventually patient  cooperates with interview and exam.  Patient complains of headache and "twitching," under his left arm.    Head is atraumatic.  No midline tenderness or deformity to cervical, thoracic, or lumbar spine.  Neuro exam is reassuring.   Per triage note patient complains of shoulder pain and foot pain to RN.  On my assessment the patient denies any pain or complaints today.  Per chart review patient has been seen multiple times in the emergency department with complaints secondary to his homelessness.  Patient states that he currently does not  have anywhere to live.  We will give patient resources for shelters in the area.  Patient able to answer questions and follow directions without difficulty.  Does not appear to be in any acute psychosis.  Physical exam is reassuring.  Vital signs within normal limits.  Will discharge patient at this time.  Patient given information to follow-up with [facility].  Discussed results, findings, treatment and follow up. Patient advised of return precautions. Patient verbalized understanding and agreed with plan.     Final Clinical Impression(s) / ED Diagnoses Final diagnoses:  Nonintractable headache, unspecified chronicity pattern, unspecified headache type    Rx / DC Orders ED Discharge Orders     None        Loni Beckwith, PA-C 04/19/21 2156    Loni Beckwith, PA-C 04/19/21 2159    Teressa Lower, MD 04/19/21 2229

## 2021-04-19 NOTE — Discharge Instructions (Addendum)
You came to the emergency department today to be evaluated for your headache.  Your physical exam was reassuring.  You received Tylenol to help with your pain.  Please take Ibuprofen (Advil, motrin) and Tylenol (acetaminophen) to relieve your pain.    You may take up to 600 MG (3 pills) of normal strength ibuprofen every 8 hours as needed.   You make take tylenol, up to 1,000 mg (two extra strength pills) every 8 hours as needed.   It is safe to take ibuprofen and tylenol at the same time as they work differently.   Do not take more than 3,000 mg tylenol in a 24 hour period (not more than one dose every 8 hours.  Please check all medication labels as many medications such as pain and cold medications may contain tylenol.  Do not drink alcohol while taking these medications.  Do not take other NSAID'S while taking ibuprofen (such as aleve or naproxen).  Please take ibuprofen with food to decrease stomach upset.  Please follow-up with  and wellness clinic in the outpatient setting.  I have given you information for shelters locally in the area.  Get help right away if: Your headache: Becomes severe quickly. Gets worse after moderate to intense physical activity. You have any of these symptoms: Repeated vomiting. Pain or stiffness in your neck. Changes to your vision. Pain in an eye or ear. Problems with speech. Muscular weakness or loss of muscle control. Loss of balance or coordination. You feel faint or pass out. You have confusion. You have a seizure.

## 2021-04-19 NOTE — ED Notes (Signed)
Currently the patient is uncooperative. He is not answering questions, moving or allowing RN to take his vitals.

## 2021-04-25 ENCOUNTER — Emergency Department (HOSPITAL_COMMUNITY)
Admission: EM | Admit: 2021-04-25 | Discharge: 2021-04-26 | Disposition: A | Discharge status: Home / Self Care | Payer: Medicare Other | Attending: Emergency Medicine | Admitting: Emergency Medicine

## 2021-04-25 DIAGNOSIS — J029 Acute pharyngitis, unspecified: Secondary | ICD-10-CM | POA: Insufficient documentation

## 2021-04-25 DIAGNOSIS — Z5321 Procedure and treatment not carried out due to patient leaving prior to being seen by health care provider: Secondary | ICD-10-CM | POA: Insufficient documentation

## 2021-04-25 DIAGNOSIS — Z20822 Contact with and (suspected) exposure to covid-19: Secondary | ICD-10-CM | POA: Insufficient documentation

## 2021-04-25 DIAGNOSIS — R0602 Shortness of breath: Secondary | ICD-10-CM | POA: Diagnosis present

## 2021-04-25 NOTE — ED Triage Notes (Signed)
Patient [place] with [place] ( homeless) reports SOB this evening , O2 sat= 98% room air , no cough or fever /ambulatory .

## 2021-04-26 ENCOUNTER — Encounter (HOSPITAL_COMMUNITY): Payer: Self-pay | Admitting: Emergency Medicine

## 2021-04-26 ENCOUNTER — Other Ambulatory Visit: Payer: Self-pay

## 2021-04-26 ENCOUNTER — Emergency Department (HOSPITAL_COMMUNITY)
Admission: EM | Admit: 2021-04-26 | Discharge: 2021-04-27 | Disposition: A | Discharge status: Home / Self Care | Payer: Medicare Other | Source: Home / Self Care

## 2021-04-26 ENCOUNTER — Emergency Department (HOSPITAL_COMMUNITY): Payer: Medicare Other

## 2021-04-26 DIAGNOSIS — R0981 Nasal congestion: Secondary | ICD-10-CM | POA: Insufficient documentation

## 2021-04-26 DIAGNOSIS — Z5321 Procedure and treatment not carried out due to patient leaving prior to being seen by health care provider: Secondary | ICD-10-CM | POA: Insufficient documentation

## 2021-04-26 LAB — RESP PANEL BY RT-PCR (FLU A&B, COVID) ARPGX2
Influenza A by PCR: NEGATIVE
Influenza B by PCR: NEGATIVE
SARS Coronavirus 2 by RT PCR: POSITIVE — AB

## 2021-04-26 NOTE — ED Notes (Signed)
Pt refused to be triaged

## 2021-04-26 NOTE — ED Notes (Signed)
Name called 3 times for vitals, no answer

## 2021-04-26 NOTE — ED Notes (Signed)
Pt refused vitals 

## 2021-04-26 NOTE — ED Provider Notes (Signed)
Emergency Medicine Provider Triage Evaluation Note  Alejandro Baker , a 45 y.o. male  was evaluated in triage.  Pt complains of SOB. MSE has been delayed for many hours as he refused to come to triage.  States some SOB, cough, and sore throat.  Denies fever or sick contacts.  Patient is homeless per EMS, picked up from local gas station.  Review of Systems  Positive: SOB, sore throat Negative: fever  Physical Exam  BP 136/86    Pulse 95    Temp 98.5 F (36.9 C)    Resp 18    SpO2 100%  Gen:   Awake, no distress   Resp:  Normal effort  MSK:   Moves extremities without difficulty  Other:  No tonsillar edema or exudates, handling secretions well, no stridor  Medical Decision Making  Medically screening exam initiated at 6:02 AM.  Appropriate orders placed.  Alejandro Baker was informed that the remainder of the evaluation will be completed by another provider, this initial triage assessment does not replace that evaluation, and the importance of remaining in the ED until their evaluation is complete.  SOB, cough, sore throat.   Refused MSE for several hours so orders delayed.  Will obtain covid screen, CXR.   Garlon Hatchet, PA-C 04/26/21 6213    Dione Booze, MD 04/26/21 [phone]

## 2021-04-26 NOTE — ED Notes (Signed)
Pt will not get up to be triaged after multiple attempts.

## 2021-04-26 NOTE — ED Notes (Signed)
No answer in lobby.

## 2021-04-26 NOTE — ED Triage Notes (Signed)
Pt reports "feeling congested, like a head cold." Denies pain.

## 2021-04-26 NOTE — ED Notes (Signed)
Patient called x3 no response 

## 2021-04-26 NOTE — ED Notes (Signed)
PT was called for vitals , X 3 PT has not answered.

## 2021-04-27 NOTE — ED Notes (Signed)
PT WAS CALLED MULTIPLE TIMES FOR VITALS AND HAD NO RESPONSE

## 2021-05-08 ENCOUNTER — Other Ambulatory Visit: Payer: Self-pay

## 2021-05-08 ENCOUNTER — Encounter (HOSPITAL_COMMUNITY): Payer: Self-pay

## 2021-05-08 ENCOUNTER — Emergency Department (HOSPITAL_COMMUNITY)
Admission: EM | Admit: 2021-05-08 | Discharge: 2021-05-08 | Disposition: A | Discharge status: Home / Self Care | Payer: Medicare Other | Source: Home / Self Care | Attending: Emergency Medicine | Admitting: Emergency Medicine

## 2021-05-08 ENCOUNTER — Emergency Department (HOSPITAL_COMMUNITY)
Admission: EM | Admit: 2021-05-08 | Discharge: 2021-05-08 | Disposition: A | Discharge status: Home / Self Care | Payer: Medicare Other | Attending: Emergency Medicine | Admitting: Emergency Medicine

## 2021-05-08 DIAGNOSIS — J029 Acute pharyngitis, unspecified: Secondary | ICD-10-CM

## 2021-05-08 DIAGNOSIS — M79672 Pain in left foot: Secondary | ICD-10-CM | POA: Insufficient documentation

## 2021-05-08 DIAGNOSIS — Z8616 Personal history of COVID-19: Secondary | ICD-10-CM | POA: Insufficient documentation

## 2021-05-08 DIAGNOSIS — Z760 Encounter for issue of repeat prescription: Secondary | ICD-10-CM | POA: Diagnosis not present

## 2021-05-08 DIAGNOSIS — Z79899 Other long term (current) drug therapy: Secondary | ICD-10-CM | POA: Diagnosis not present

## 2021-05-08 DIAGNOSIS — M79671 Pain in right foot: Secondary | ICD-10-CM

## 2021-05-08 DIAGNOSIS — Z20822 Contact with and (suspected) exposure to covid-19: Secondary | ICD-10-CM | POA: Insufficient documentation

## 2021-05-08 DIAGNOSIS — F259 Schizoaffective disorder, unspecified: Secondary | ICD-10-CM | POA: Diagnosis not present

## 2021-05-08 LAB — CBC WITH DIFFERENTIAL/PLATELET
Abs Immature Granulocytes: 0.03 10*3/uL (ref 0.00–0.07)
Basophils Absolute: 0 10*3/uL (ref 0.0–0.1)
Basophils Relative: 1 %
Eosinophils Absolute: 0.1 10*3/uL (ref 0.0–0.5)
Eosinophils Relative: 2 %
HCT: 45 % (ref 39.0–52.0)
Hemoglobin: 14.7 g/dL (ref 13.0–17.0)
Immature Granulocytes: 0 %
Lymphocytes Relative: 37 %
Lymphs Abs: 2.8 10*3/uL (ref 0.7–4.0)
MCH: 30.4 pg (ref 26.0–34.0)
MCHC: 32.7 g/dL (ref 30.0–36.0)
MCV: 93 fL (ref 80.0–100.0)
Monocytes Absolute: 0.7 10*3/uL (ref 0.1–1.0)
Monocytes Relative: 9 %
Neutro Abs: 3.9 10*3/uL (ref 1.7–7.7)
Neutrophils Relative %: 51 %
Platelets: 396 10*3/uL (ref 150–400)
RBC: 4.84 MIL/uL (ref 4.22–5.81)
RDW: 13.1 % (ref 11.5–15.5)
WBC: 7.6 10*3/uL (ref 4.0–10.5)
nRBC: 0 % (ref 0.0–0.2)

## 2021-05-08 LAB — RESP PANEL BY RT-PCR (FLU A&B, COVID) ARPGX2
Influenza A by PCR: NEGATIVE
Influenza B by PCR: NEGATIVE
SARS Coronavirus 2 by RT PCR: NEGATIVE

## 2021-05-08 LAB — COMPREHENSIVE METABOLIC PANEL
ALT: 20 U/L (ref 0–44)
AST: 22 U/L (ref 15–41)
Albumin: 4.1 g/dL (ref 3.5–5.0)
Alkaline Phosphatase: 59 U/L (ref 38–126)
Anion gap: 10 (ref 5–15)
BUN: 23 mg/dL — ABNORMAL HIGH (ref 6–20)
CO2: 25 mmol/L (ref 22–32)
Calcium: 9.2 mg/dL (ref 8.9–10.3)
Chloride: 103 mmol/L (ref 98–111)
Creatinine, Ser: 0.77 mg/dL (ref 0.61–1.24)
GFR, Estimated: >60 mL/min (ref >60)
Glucose, Bld: 322 mg/dL — ABNORMAL HIGH (ref 70–99)
Potassium: 3.7 mmol/L (ref 3.5–5.1)
Sodium: 138 mmol/L (ref 135–145)
Total Bilirubin: 0.7 mg/dL (ref 0.3–1.2)
Total Protein: 7.5 g/dL (ref 6.5–8.1)

## 2021-05-08 LAB — ACETAMINOPHEN LEVEL: Acetaminophen (Tylenol), Serum: <10 ug/mL — ABNORMAL LOW (ref 10–30)

## 2021-05-08 LAB — GROUP A STREP BY PCR: Group A Strep by PCR: NOT DETECTED

## 2021-05-08 LAB — SALICYLATE LEVEL: Salicylate Lvl: <7 mg/dL — ABNORMAL LOW (ref 7.0–30.0)

## 2021-05-08 LAB — ETHANOL: Alcohol, Ethyl (B): <10 mg/dL (ref <10)

## 2021-05-08 MED ORDER — RISPERIDONE 2 MG PO TABS
2.0000 mg | ORAL_TABLET | Freq: Every day | ORAL | 0 refills | Status: DC
Start: 2021-05-08 — End: 2021-05-27

## 2021-05-08 NOTE — ED Notes (Signed)
Patient provided with dry socks

## 2021-05-08 NOTE — Progress Notes (Signed)
Shelter list is attached to pt's AVS.  Alejandro Baker.Caley Volkert, MSW, LCSWA [facility]   Transitions of Care Clinical Social Worker I Direct Dial: [phone]   Fax: [phone] [email or web address]

## 2021-05-08 NOTE — ED Triage Notes (Signed)
Pt complains of sharp pains in his feet x 2 days.

## 2021-05-08 NOTE — Discharge Instructions (Addendum)
Ensure you are changing your socks frequently and avoiding exposure to cold and water. Keep feet clean, warm, dry, and elevated as needed. Monitor for signs of infection.

## 2021-05-08 NOTE — Discharge Instructions (Addendum)
You were evaluated in the Emergency Department and after careful evaluation, we did not find any emergent condition requiring admission or further testing in the hospital.  Your exam/testing today was overall reassuring. Your COVID, Flu, and strep testing was negative. Ok to follow up at the [facility] for medication management.  Please return to the Emergency Department if you experience any worsening of your condition.  Thank you for allowing [place] to be a part of your care.

## 2021-05-08 NOTE — ED Notes (Signed)
Pt is refusing to change out, pt states that he needs new underwear.

## 2021-05-08 NOTE — ED Notes (Signed)
Pt provided x1 bus pass. 

## 2021-05-08 NOTE — ED Provider Notes (Signed)
[facility]-EMERGENCY DEPT Provider Note   CSN: [id number] Arrival date & time: 05/08/21  0418     History  Chief Complaint  Patient presents with   Foot Pain    Alejandro Baker is a 46 y.o. male.   Foot Pain   46 year old male with a history of schizoaffective disorder presenting to the emergency department with a chief complaint of bilateral foot pain.  The patient was recently seen in the emergency department earlier tonight for different complaints, specifically a medication refill and a sore throat.  He has a history of homelessness and currently has no place to stay.  He states that he was walking out in the rain earlier today and has bilateral pain in his feet.  He denies any fevers or chills.  He denies any other complaints at this time.  He is looking for a bed to rest.  Home Medications Prior to Admission medications   Medication Sig Start Date End Date Taking? Authorizing Provider  risperiDONE (RISPERDAL) 2 MG tablet Take 1 tablet (2 mg total) by mouth at bedtime. 05/08/21 06/07/21  [name] Avena, MD      Allergies    Penicillins and Povidone-iodine    Review of Systems   Review of Systems  Physical Exam Updated Vital Signs BP 117/73 (BP Location: Right Arm)    Pulse 83    Temp 98.7 F (37.1 C) (Oral)    Resp 15    SpO2 99%  Physical Exam Vitals and nursing note reviewed.  Constitutional:      General: He is not in acute distress. HENT:     Head: Normocephalic and atraumatic.  Eyes:     Conjunctiva/sclera: Conjunctivae normal.     Pupils: Pupils are equal, round, and reactive to light.  Cardiovascular:     Rate and Rhythm: Normal rate and regular rhythm.  Pulmonary:     Effort: Pulmonary effort is normal. No respiratory distress.  Abdominal:     General: There is no distension.     Tenderness: There is no guarding.  Musculoskeletal:        General: No deformity or signs of injury.     Cervical back: Neck supple.     Comments: Socks  wet, bilateral feet with mild erythema and tenderness, intact 2+ DP pulses  Skin:    Findings: No lesion or rash.  Neurological:     General: No focal deficit present.     Mental Status: He is alert. Mental status is at baseline.    ED Results / Procedures / Treatments   Labs (all labs ordered are listed, but only abnormal results are displayed) Labs Reviewed - No data to display  EKG None  Radiology No results found.  Procedures Procedures    Medications Ordered in ED Medications - No data to display  ED Course/ Medical Decision Making/ A&P                           Medical Decision Making  46 year old male with a history of schizoaffective disorder presenting to the emergency department with a chief complaint of bilateral foot pain.  The patient was recently seen in the emergency department earlier tonight for different complaints, specifically a medication refill and a sore throat.  He has a history of homelessness and currently has no place to stay.  He states that he was walking out in the rain earlier today and has bilateral pain in his feet.  He denies any fevers or chills.  He denies any other complaints at this time.  He is looking for a bed to rest.  On arrival, the patient was afebrile, vital stable.  Previously evaluated in the emergency department 2 hours ago with a new complaint of bilateral foot pain.  Symptoms have been present for the past 2 days but worsened overnight.  Has wet socks and some erythema along the bases of the feet with intact pulses bilaterally.  Concern for potential cold and water exposure resulting in skin irritation.  Patient has intact pulses bilaterally on exam.  Some concern for cold/water exposure of the feet.  The patient was provided with dry socks and allowed time to rest in the emergency department.  No concern for cellulitis on either foot bilaterally.  Counseled the patient on need for changing his socks frequently and avoiding exposure  to cold and keeping feet warm and dry.  No concern for active gangrene at this time.  Social work consult was placed as the patient is currently homeless in order to contact a shelter in the area for safe disposition.   Final Clinical Impression(s) / ED Diagnoses Final diagnoses:  Pain in both feet    Rx / DC Orders ED Discharge Orders     None         [name] Avena, MD 05/08/21 [phone]

## 2021-05-08 NOTE — ED Provider Notes (Signed)
[facility]-EMERGENCY DEPT Provider Note   CSN: [id number] Arrival date & time: 05/08/21  0105     History  Chief Complaint  Patient presents with   Medication Refill   Medical Clearance    Alejandro Baker is a 46 y.o. male.   Medication Refill  46 year old male with a history of schizoaffective disorder, recent positive test for COVID-19 12 days ago, presenting to the emergency department with a need for medication refill and a complaint of sore throat.  The patient states that he has had a sore throat for the past few days.  He is requesting a refill of his outpatient risperidone.  On chart review, I do note that the patient was seen by psychiatry during a prior ED visit and prescribed Risperdal 2 mg daily.  The patient currently denies any SI, HI or AVH.  He does not appear to be responding to internal stimuli.  He denies any unilateral nature to his sore throat.  He denies any fever or chills.  He does have a history of homelessness.  Home Medications Prior to Admission medications   Medication Sig Start Date End Date Taking? Authorizing Provider  risperiDONE (RISPERDAL) 2 MG tablet Take 1 tablet (2 mg total) by mouth at bedtime. 05/08/21 06/07/21 Yes [name] Avena, MD      Allergies    Penicillins and Povidone-iodine    Review of Systems   Review of Systems  Physical Exam Updated Vital Signs BP 128/73 (BP Location: Right Arm)    Pulse 100    Temp 98.7 F (37.1 C) (Oral)    Resp 14    Ht 6' (1.829 m)    Wt 79.4 kg    SpO2 96%    BMI 23.73 kg/m  Physical Exam Vitals and nursing note reviewed.  Constitutional:      General: He is not in acute distress.    Appearance: He is well-developed.  HENT:     Head: Normocephalic and atraumatic.     Mouth/Throat:     Pharynx: Posterior oropharyngeal erythema present. No oropharyngeal exudate.     Tonsils: Tonsillar exudate present. No tonsillar abscesses.  Eyes:     Conjunctiva/sclera: Conjunctivae normal.   Cardiovascular:     Rate and Rhythm: Normal rate and regular rhythm.     Heart sounds: No murmur heard. Pulmonary:     Effort: Pulmonary effort is normal. No respiratory distress.     Breath sounds: Normal breath sounds.  Abdominal:     Palpations: Abdomen is soft.     Tenderness: There is no abdominal tenderness.  Musculoskeletal:        General: No swelling.     Cervical back: Neck supple.  Skin:    General: Skin is warm and dry.     Capillary Refill: Capillary refill takes less than 2 seconds.  Neurological:     Mental Status: He is alert.  Psychiatric:        Mood and Affect: Mood normal.    ED Results / Procedures / Treatments   Labs (all labs ordered are listed, but only abnormal results are displayed) Labs Reviewed  COMPREHENSIVE METABOLIC PANEL - Abnormal; Notable for the following components:      Result Value   Glucose, Bld 322 (*)    BUN 23 (*)    All other components within normal limits  SALICYLATE LEVEL - Abnormal; Notable for the following components:   Salicylate Lvl <7.0 (*)    All other components within normal  limits  ACETAMINOPHEN LEVEL - Abnormal; Notable for the following components:   Acetaminophen (Tylenol), Serum <10 (*)    All other components within normal limits  RESP PANEL BY RT-PCR (FLU A&B, COVID) ARPGX2  GROUP A STREP BY PCR  CBC WITH DIFFERENTIAL/PLATELET  ETHANOL  RAPID URINE DRUG SCREEN, HOSP PERFORMED    EKG None  Radiology No results found.  Procedures Procedures    Medications Ordered in ED Medications - No data to display  ED Course/ Medical Decision Making/ A&P                           Medical Decision Making  46 year old male with a history of schizoaffective disorder, recent positive test for COVID-19 12 days ago, presenting to the emergency department with a need for medication refill and a complaint of sore throat.  The patient states that he has had a sore throat for the past few days.  He is requesting a  refill of his outpatient risperidone.  On chart review, I do note that the patient was seen by psychiatry during a prior ED visit and prescribed Risperdal 2 mg daily.  The patient currently denies any SI, HI or AVH.  He does not appear to be responding to internal stimuli.  He denies any unilateral nature to his sore throat.  He denies any fever or chills.  He does have a history of homelessness.  On arrival, the patient was afebrile, mildly tachycardic P105, hemodynamically stable, saturating well on room air.  Physical exam significant for tonsillar erythema and exudate bilaterally with no evidence of PTA.  Additionally, the patient has no SI, HI or AVH.  He appears to be at his baseline psychiatrically with no acute concern for decompensation.  He screens no risk by the [place] suicide risk rating tool.  I do not see an acute need for psychiatric consultation in the emergency setting at this time.  The patient was requesting a refill of his Risperdal which I provided.  Given his complaint of sore throat with evidence of a pharyngitis, further evaluation was conducted with COVID-19 and influenza PCR, strep PCR testing.  He previously tested positive for COVID-19 12 days ago.  Strep PCR testing not detected, COVID-19 and influenza PCR testing negative.  The patient has no leukocytosis or anemia.  His CMP revealed hyperglycemia with no anion gap acidosis.  Suspect likely viral pharyngitis as etiology of the patient's sore throat with no evidence of PTA or RPA on exam.  Overall at this time the patient has been appropriately medically screened.  Resources were provided given the patient's history of homelessness and given his history of schizoaffective disorder.  He was advised to follow-up with the behavioral health urgent care as needed outpatient and advised to follow-up with outpatient psychiatry.  No other acute complaints at this time.  Overall stable for discharge.    Final Clinical Impression(s) / ED  Diagnoses Final diagnoses:  Sore throat  Encounter for medication refill  Schizoaffective disorder, unspecified type (HCC)    Rx / DC Orders ED Discharge Orders          Ordered    risperiDONE (RISPERDAL) 2 MG tablet  Daily at bedtime        05/08/21 0349              [name] Avena, MD 05/08/21 [phone]

## 2021-05-08 NOTE — ED Notes (Signed)
Pt required security escort out of emergency department. Pt refused to leave after discharge education. Pt expressed "I'm so sick of this bullshit."

## 2021-05-08 NOTE — ED Notes (Signed)
I provided reinforced discharge education based off of discharge instructions. Pt acknowledged and understood my education. Pt had no further questions/concerns for provider/myself.  °

## 2021-05-08 NOTE — ED Triage Notes (Addendum)
Pt BIB EMS. Pt states that he needs his Risperidone and would like to speak to a Psychiatrist because he is having mental health issues. Pt also complains of a sore throat.

## 2021-05-09 ENCOUNTER — Other Ambulatory Visit: Payer: Self-pay

## 2021-05-09 ENCOUNTER — Emergency Department (HOSPITAL_COMMUNITY): Payer: Medicare Other

## 2021-05-09 ENCOUNTER — Encounter (HOSPITAL_COMMUNITY): Payer: Self-pay

## 2021-05-09 ENCOUNTER — Emergency Department (HOSPITAL_COMMUNITY)
Admission: EM | Admit: 2021-05-09 | Discharge: 2021-05-09 | Disposition: A | Discharge status: Home / Self Care | Payer: Medicare Other | Attending: Emergency Medicine | Admitting: Emergency Medicine

## 2021-05-09 DIAGNOSIS — R0602 Shortness of breath: Secondary | ICD-10-CM | POA: Insufficient documentation

## 2021-05-09 DIAGNOSIS — R059 Cough, unspecified: Secondary | ICD-10-CM | POA: Diagnosis not present

## 2021-05-09 NOTE — ED Triage Notes (Signed)
Pt BIB EMS with reports of SHOB. Pt was found in the [facility] bathroom sleeping.

## 2021-05-09 NOTE — Discharge Instructions (Signed)
You appear to have an upper respiratory infection (URI). An upper respiratory tract infection, or cold, is a viral infection of the air passages leading to the lungs. It is contagious and can be spread to others, especially during the first 3 or 4 days. It cannot be cured by antibiotics or other medicines. °RETURN IMMEDIATELY IF you develop shortness of breath, confusion or altered mental status, a new rash, become dizzy, faint, or poorly responsive, or are unable to be cared for at home. ° °

## 2021-05-09 NOTE — ED Provider Notes (Signed)
[facility]-EMERGENCY DEPT Provider Note   CSN: [id number] Arrival date & time: 05/09/21  2123     History Homelessness Chief Complaint  Patient presents with   Shortness of Breath    Alejandro Baker is a 46 y.o. male who presents emergency department with complaint of cough.  The patient is a frequent and well-known patient in the emergency department.  He is currently experiencing homelessness.  He was found sleeping in a [employer] bathroom and when asked to leave began complaining of shortness of breath and cough.  He denies fever.   Shortness of Breath     Home Medications Prior to Admission medications   Medication Sig Start Date End Date Taking? Authorizing Provider  risperiDONE (RISPERDAL) 2 MG tablet Take 1 tablet (2 mg total) by mouth at bedtime. 05/08/21 06/07/21  [name] Avena, MD      Allergies    Penicillins and Povidone-iodine    Review of Systems   Review of Systems  Respiratory:  Positive for shortness of breath.    Physical Exam Updated Vital Signs BP 108/63 (BP Location: Left Arm)    Pulse 97    Temp 98.8 F (37.1 C) (Oral)    Resp 16    SpO2 96%  Physical Exam Vitals and nursing note reviewed.  Constitutional:      General: He is not in acute distress.    Appearance: He is well-developed. He is not diaphoretic.     Comments: Unkempt appearance  HENT:     Head: Normocephalic and atraumatic.  Eyes:     General: No scleral icterus.    Conjunctiva/sclera: Conjunctivae normal.  Cardiovascular:     Rate and Rhythm: Normal rate and regular rhythm.     Heart sounds: Normal heart sounds.  Pulmonary:     Effort: Pulmonary effort is normal. No respiratory distress.     Breath sounds: Normal breath sounds. No decreased breath sounds, wheezing, rhonchi or rales.  Abdominal:     Palpations: Abdomen is soft.     Tenderness: There is no abdominal tenderness.  Musculoskeletal:     Cervical back: Normal range of motion and neck supple.   Skin:    General: Skin is warm and dry.  Neurological:     Mental Status: He is alert.  Psychiatric:        Behavior: Behavior normal.    ED Results / Procedures / Treatments   Labs (all labs ordered are listed, but only abnormal results are displayed) Labs Reviewed - No data to display  EKG None  Radiology DG Chest Port 1 View  Result Date: 05/09/2021 CLINICAL DATA:  Cough EXAM: PORTABLE CHEST 1 VIEW COMPARISON:  04/26/2021, 06/17/2020 FINDINGS: The heart size and mediastinal contours are within normal limits. Both lungs are clear. The visualized skeletal structures are unremarkable. Artifact over the right upper chest and left apex. IMPRESSION: No active disease. Electronically Signed   By: Jasmine Pang M.D.   On: 05/09/2021 22:05    Procedures Procedures    Medications Ordered in ED Medications - No data to display  ED Course/ Medical Decision Making/ A&P                           Medical Decision Making  Patient here with cough. Normal vital signs, normal respirations and oxygen saturation.  Patient afebrile.  I personally interpreted CXR which shows no acute findings. Pt stable for discharge. Social factors complicating patient care  include chronic homelessness, psychiatric disorder  Final Clinical Impression(s) / ED Diagnoses Final diagnoses:  None    Rx / DC Orders ED Discharge Orders     None         Margarita Mail, PA-C 05/09/21 2343    Drenda Freeze, MD 05/10/21 1452

## 2021-05-18 ENCOUNTER — Other Ambulatory Visit: Payer: Self-pay

## 2021-05-18 ENCOUNTER — Encounter (HOSPITAL_COMMUNITY): Payer: Self-pay

## 2021-05-18 ENCOUNTER — Emergency Department (HOSPITAL_COMMUNITY)
Admission: EM | Admit: 2021-05-18 | Discharge: 2021-05-19 | Disposition: A | Discharge status: Home / Self Care | Payer: Medicare Other | Attending: Emergency Medicine | Admitting: Emergency Medicine

## 2021-05-18 DIAGNOSIS — R202 Paresthesia of skin: Secondary | ICD-10-CM | POA: Diagnosis not present

## 2021-05-18 DIAGNOSIS — Z760 Encounter for issue of repeat prescription: Secondary | ICD-10-CM | POA: Diagnosis not present

## 2021-05-18 DIAGNOSIS — Z7984 Long term (current) use of oral hypoglycemic drugs: Secondary | ICD-10-CM | POA: Diagnosis not present

## 2021-05-18 DIAGNOSIS — E119 Type 2 diabetes mellitus without complications: Secondary | ICD-10-CM | POA: Diagnosis not present

## 2021-05-18 DIAGNOSIS — M542 Cervicalgia: Secondary | ICD-10-CM | POA: Insufficient documentation

## 2021-05-18 DIAGNOSIS — Z79899 Other long term (current) drug therapy: Secondary | ICD-10-CM | POA: Diagnosis not present

## 2021-05-18 NOTE — ED Triage Notes (Signed)
Pt approached EMS at [facility] with c/o neck pain. Refused EMS VS prior to arrival.

## 2021-05-19 ENCOUNTER — Encounter (HOSPITAL_COMMUNITY): Payer: Self-pay

## 2021-05-19 ENCOUNTER — Emergency Department (HOSPITAL_COMMUNITY)
Admission: EM | Admit: 2021-05-19 | Discharge: 2021-05-19 | Disposition: A | Discharge status: Home / Self Care | Payer: Medicare Other | Source: Home / Self Care | Attending: Emergency Medicine | Admitting: Emergency Medicine

## 2021-05-19 ENCOUNTER — Other Ambulatory Visit: Payer: Self-pay

## 2021-05-19 DIAGNOSIS — Z760 Encounter for issue of repeat prescription: Secondary | ICD-10-CM | POA: Insufficient documentation

## 2021-05-19 DIAGNOSIS — Z79899 Other long term (current) drug therapy: Secondary | ICD-10-CM | POA: Insufficient documentation

## 2021-05-19 DIAGNOSIS — Z7984 Long term (current) use of oral hypoglycemic drugs: Secondary | ICD-10-CM | POA: Insufficient documentation

## 2021-05-19 DIAGNOSIS — R202 Paresthesia of skin: Secondary | ICD-10-CM | POA: Insufficient documentation

## 2021-05-19 DIAGNOSIS — E119 Type 2 diabetes mellitus without complications: Secondary | ICD-10-CM | POA: Insufficient documentation

## 2021-05-19 LAB — CBG MONITORING, ED: Glucose-Capillary: 146 mg/dL — ABNORMAL HIGH (ref 70–99)

## 2021-05-19 MED ORDER — ALBUTEROL SULFATE HFA 108 (90 BASE) MCG/ACT IN AERS: 1.0000 | INHALATION_SPRAY | Freq: Four times a day (QID) | RESPIRATORY_TRACT | 0 refills | Status: DC | PRN

## 2021-05-19 MED ORDER — METFORMIN HCL 500 MG PO TABS: 500.0000 mg | ORAL_TABLET | Freq: Two times a day (BID) | ORAL | 0 refills | Status: DC

## 2021-05-19 MED ORDER — FLUTICASONE PROPIONATE 50 MCG/ACT NA SUSP
1.0000 | Freq: Every day | NASAL | 2 refills | Status: DC
Start: 2021-05-19 — End: 2023-07-13

## 2021-05-19 NOTE — ED Triage Notes (Signed)
Pt signed in again to be seen for refills on his Metformin and inhaler. States he has not taken his meds in over 4 months. Pt states "just thought of it and come in" No specific complaints other than wanting to sleep.

## 2021-05-19 NOTE — ED Triage Notes (Signed)
Pt refused to be seen. Returned minutes later asking for refill on metformin and an inhaler. Last use 6 mont hs ago.

## 2021-05-19 NOTE — ED Notes (Signed)
Pt declining medical treatment stating he does not want to be seen by anyone. Refusing treatment.

## 2021-05-19 NOTE — Discharge Instructions (Addendum)
Please return if you have significant worsening pain, numbness.

## 2021-05-19 NOTE — ED Provider Notes (Signed)
[facility]-EMERGENCY DEPT Provider Note   CSN: [id number] Arrival date & time: 05/19/21  0636     History  Chief Complaint  Patient presents with   Medication Refill    Alejandro Baker is a 46 y.o. male with past medical history significant for substance abuse disorder, schizoaffective disorder, schizophrenia, diabetes who presents for medication refill. He reports that he has no specific complaint. Denies pain. Does endorse some sinus pressure recently and requests something for this. Hx of metformin 500mg  bid. Reports some tingling in feet. Denies chest pain, shortness of breath. No SI, HI, active hallucinations at this time.    Medication Refill     Home Medications Prior to Admission medications   Medication Sig Start Date End Date Taking? Authorizing Provider  albuterol (VENTOLIN HFA) 108 (90 Base) MCG/ACT inhaler Inhale 1-2 puffs into the lungs every 6 (six) hours as needed for wheezing or shortness of breath. 05/19/21  Yes Alan Drummer H, PA-C  fluticasone (FLONASE) 50 MCG/ACT nasal spray Place 1 spray into both nostrils daily. 05/19/21  Yes Jevante Hollibaugh H, PA-C  metFORMIN (GLUCOPHAGE) 500 MG tablet Take 1 tablet (500 mg total) by mouth 2 (two) times daily with a meal. 05/19/21  Yes Lawson Isabell H, PA-C  risperiDONE (RISPERDAL) 2 MG tablet Take 1 tablet (2 mg total) by mouth at bedtime. 05/08/21 06/07/21  08/05/21, MD      Allergies    Penicillins and Povidone-iodine    Review of Systems   Review of Systems  HENT:  Positive for sinus pressure.   All other systems reviewed and are negative.  Physical Exam Updated Vital Signs BP (!) 110/91 (BP Location: Right Arm)    Pulse 87    Temp 98 F (36.7 C) (Oral)    Resp 14    Ht 6' (1.829 m)    Wt 79.4 kg    SpO2 100%    BMI 23.73 kg/m  Physical Exam Vitals and nursing note reviewed.  Constitutional:      General: He is not in acute distress.    Appearance: Normal appearance.      Comments: Disheveled appearance, however in no acute distress.  HENT:     Head: Normocephalic and atraumatic.  Eyes:     General:        Right eye: No discharge.        Left eye: No discharge.  Cardiovascular:     Rate and Rhythm: Normal rate and regular rhythm.  Pulmonary:     Effort: Pulmonary effort is normal. No respiratory distress.     Comments: No wheezing.  No respiratory distress, no rhonchi, stridor, consolidation noted.  Good air movement throughout both lung fields. Musculoskeletal:        General: No deformity.  Skin:    General: Skin is warm and dry.     Comments: Normal appearance of bilateral feet other than some dirt, well-healed blisters  Neurological:     Mental Status: He is alert and oriented to person, place, and time.  Psychiatric:        Mood and Affect: Mood normal.        Behavior: Behavior normal.    ED Results / Procedures / Treatments   Labs (all labs ordered are listed, but only abnormal results are displayed) Labs Reviewed  CBG MONITORING, ED - Abnormal; Notable for the following components:      Result Value   Glucose-Capillary 146 (*)    All other components within  normal limits    EKG None  Radiology No results found.  Procedures Procedures    Medications Ordered in ED Medications - No data to display  ED Course/ Medical Decision Making/ A&P                           Medical Decision Making Risk Prescription drug management.   Patient with history of diabetes, is struggling with homelessness, financial need.  Reports that he is out of medications, needs financial assistance to fill these medications.  I personally obtained and reviewed CBG which shows blood glucose of 146.  No evidence of DKA, or severe hyperglycemia at this time.  Patient appears pleasant, not actively hallucinating, no SI, HI, AVH at this time.  He endorses some sinus pressure, I appreciated some possible minimal sinus fullness, congestion on his exam.  He  has normal lung sounds throughout, but does report a history of asthma, he is not having any acute respiratory distress at this time. We will discharge with metformin, albuterol, and Flonase.  Encouraged follow-up with [facility], community wellness for financial assistance.  Patient provided resources for shelters, and other financial assistance programs in the area.  Discharged in stable condition at this time, return precautions given. Final Clinical Impression(s) / ED Diagnoses Final diagnoses:  Medication refill    Rx / DC Orders ED Discharge Orders          Ordered    metFORMIN (GLUCOPHAGE) 500 MG tablet  2 times daily with meals        05/19/21 0827    albuterol (VENTOLIN HFA) 108 (90 Base) MCG/ACT inhaler  Every 6 hours PRN        05/19/21 0827    fluticasone (FLONASE) 50 MCG/ACT nasal spray  Daily        05/19/21 0827              Olene Floss, PA-C 05/19/21 1275    Cathren Laine, MD 05/19/21 [phone]

## 2021-05-19 NOTE — ED Notes (Signed)
Pt still refusing to be seen at this time. Security notified.

## 2021-05-27 ENCOUNTER — Ambulatory Visit (HOSPITAL_COMMUNITY)
Admission: EM | Admit: 2021-05-27 | Discharge: 2021-05-27 | Disposition: A | Discharge status: Home / Self Care | Payer: Medicare Other | Attending: Psychiatry | Admitting: Psychiatry

## 2021-05-27 DIAGNOSIS — F29 Unspecified psychosis not due to a substance or known physiological condition: Secondary | ICD-10-CM | POA: Diagnosis not present

## 2021-05-27 DIAGNOSIS — Z5900 Homelessness unspecified: Secondary | ICD-10-CM | POA: Insufficient documentation

## 2021-05-27 DIAGNOSIS — F19959 Other psychoactive substance use, unspecified with psychoactive substance-induced psychotic disorder, unspecified: Secondary | ICD-10-CM | POA: Insufficient documentation

## 2021-05-27 DIAGNOSIS — Z760 Encounter for issue of repeat prescription: Secondary | ICD-10-CM | POA: Diagnosis not present

## 2021-05-27 DIAGNOSIS — Z765 Malingerer [conscious simulation]: Secondary | ICD-10-CM | POA: Insufficient documentation

## 2021-05-27 DIAGNOSIS — Z79899 Other long term (current) drug therapy: Secondary | ICD-10-CM | POA: Insufficient documentation

## 2021-05-27 DIAGNOSIS — Z7984 Long term (current) use of oral hypoglycemic drugs: Secondary | ICD-10-CM | POA: Insufficient documentation

## 2021-05-27 MED ORDER — RISPERIDONE 2 MG PO TABS: 2.0000 mg | ORAL_TABLET | Freq: Every day | ORAL | 0 refills | Status: DC

## 2021-05-27 MED ORDER — METFORMIN HCL 500 MG PO TABS: 500.0000 mg | ORAL_TABLET | Freq: Two times a day (BID) | ORAL | 0 refills | Status: DC

## 2021-05-27 NOTE — Discharge Summary (Signed)
[name] to be D/C'd Home per NP order. Discussed with the patient and all questions fully answered. An After Visit Summary was printed and given to the patient. Medication scripts were also given to patient. Patient escorted out, and D/C home via private auto.  Dickie La  05/27/2021 6:06 PM

## 2021-05-27 NOTE — Discharge Instructions (Signed)

## 2021-05-27 NOTE — Progress Notes (Signed)
Patient is a 46 year old male that presents this date requesting medication refills on his psych medications. Patient states he was recently released from jail and was provided medication while incarcerated although since he was released two days ago he has been off his Risperdal. Patient denies any S/I, H/I or AVH. Patient was seen and evaluated by Effie Shy NP who evaluated patient and assisted with medication management for ongoing needs.

## 2021-05-27 NOTE — ED Provider Notes (Signed)
Behavioral Health Urgent Care Medical Screening Exam  Patient Name: Alejandro Baker MRN: [id number] Date of Evaluation: 05/27/21 Chief Complaint:   Diagnosis:  Final diagnoses:  Medication refill    History of Present illness: Alejandro Baker is a 46 y.o. male patient presented to [facility] as a walk in alone with complaints of "I need my medicines refilled".  [name], 46 y.o., male patient seen face to face by this provider, consulted with Dr. Lucianne Muss; and chart reviewed on 05/27/21.    Patient is requesting medication refills on his metformin and Risperdal.  Patient was seen in the [facility] ED on 05/19/2021 presenting with request for medication refills.  Per chart review patient was provided with prescriptions for refills. His glucose at that times was 146. Patient has a history of psychosis, homelessness, substance induced psychosis, and malingering.  Reports he is prescribed metformin 500 mg twice daily and risperidone 2 mg nightly.  Educated patient that the emergency room and urgent care setting is not an appropriate setting for medication refills.  Discussed the importance of an outpatient psychiatric provider and a primary care physician.  Discussed resources that were given to him when he was discharged from the [facility] ED on 05/19/2021 for Alejandro Baker and wellness.    Is my last time patient is dressed in scrubs and slides.  He reports he was just released from jail.  When inquiring the reason for being in jail patient states,, "I was in there eating and getting my medications".  Inquired again on the reason for being imprisoned he states "well that really none of your concern".  Reports while he was in prison today his "sugar" was checked, and he just needs medication refills.  Explained to patient that he was provided refills when he was at the [facility] ED.  He states he was not given a prescription for medications and if he was he may have thrown them away by accident.  During evaluation Ezekiah Frydenlund is  in sitting position.  He is in no acute distress.  He is alert and oriented x3  and cooperative.  His speech is at a clear tone, moderate volume, and normal pace.  He makes good eye contact.  He has a dysphoric affect, he denies depression.  He denies any concerns with appetite or sleep.  He does not appear to be responding to internal/external stimuli.  He denies AVH.  He denies SI/HI.    Educated patient that this provider will provide him with a 30-day prescription for metformin 500 mg twice daily and Risperdal 2 mg nightly.  Educated that medication refills will not be done in the urgent care setting. Provided open access walk-in hours for outpatient services on the second floor. Patient verbalized understanding and states he will follow-up with [place] and wellness.     At this time Guage Riopel is educated and verbalizes understanding of mental health resources and other crisis services in the community.  He is instructed to call 911 and present to the nearest emergency room should he experience any suicidal/homicidal ideation, auditory/visual/hallucinations, or detrimental worsening of his mental health condition.  He was a also advised by Clinical research associate that he could call the toll-free phone on insurance card to assist with identifying in network counselors and agencies or number on back of Medicaid card t speak with care coordinator    Psychiatric Specialty Exam  Presentation  General Appearance:Disheveled  Eye Contact:Fair  Speech:Clear and Coherent; Normal Rate  Speech Volume:Normal  Handedness:Right  Mood and Affect  Mood:Dysphoric  Affect:Congruent   Thought Process  Thought Processes:Coherent  Descriptions of Associations:Intact  Orientation:Full (Time, Place and Person)  Thought Content:Logical  Diagnosis of Schizophrenia or Schizoaffective disorder in past: Yes  Duration of Psychotic Symptoms: Greater than six months  Hallucinations:None Not able to describe, but  responding during evaluation Not able to describe, but responding during evaluation  Ideas of Reference:None  Suicidal Thoughts:No  Homicidal Thoughts:No   Sensorium  Memory:Immediate Fair; Recent Fair; Remote Fair  Judgment:Fair  Insight:Fair   Executive Functions  Concentration:Good  Attention Span:Good  Recall:Good  Fund of Knowledge:Good  Language:Good   Psychomotor Activity  Psychomotor Activity:Normal   Assets  Assets:Communication Skills; Desire for Improvement; Physical Health; Resilience   Sleep  Sleep:Poor  Number of hours: 2   No data recorded  Physical Exam: Physical Exam Vitals and nursing note reviewed.  Constitutional:      General: He is not in acute distress.    Appearance: Normal appearance. He is well-developed.  HENT:     Head: Normocephalic.  Eyes:     General:        Right eye: No discharge.        Left eye: No discharge.     Conjunctiva/sclera: Conjunctivae normal.  Cardiovascular:     Rate and Rhythm: Normal rate.  Pulmonary:     Effort: Pulmonary effort is normal. No respiratory distress.  Musculoskeletal:        General: No swelling. Normal range of motion.     Cervical back: Normal range of motion.  Skin:    Coloration: Skin is not jaundiced or pale.  Neurological:     Mental Status: He is alert and oriented to person, place, and time.  Psychiatric:        Attention and Perception: Attention and perception normal.        Mood and Affect: Mood normal.        Speech: Speech normal.        Behavior: Behavior is cooperative.        Thought Content: Thought content normal.        Cognition and Memory: Cognition normal.        Judgment: Judgment normal.   Review of Systems  Constitutional: Negative.   HENT: Negative.    Eyes: Negative.   Respiratory: Negative.    Cardiovascular: Negative.   Musculoskeletal: Negative.   Skin: Negative.   Neurological: Negative.   Psychiatric/Behavioral: Negative.    Blood  pressure (!) 138/105, pulse 95, temperature 98.3 F (36.8 C), temperature source Oral, resp. rate 19, SpO2 99 %. There is no height or weight on file to calculate BMI.  Musculoskeletal: Strength & Muscle Tone: within normal limits Gait & Station: normal Patient leans: N/A   [facility] MSE Discharge Disposition for Follow up and Recommendations: Based on my evaluation the patient does not appear to have an emergency medical condition and can be discharged with resources and follow up care in outpatient services for Medication Management  Discharge patient  Provided patient with printed prescription for metformin 500 mg twice daily and Risperdal 2 mg nightly.  Provided resources for outpatient psychiatric resources including [facility] outpatient services on the second floor including open access walk-in hours.  Provided resources for [facility] health and wellness for primary care needs.   No evidence of imminent risk to self or others at present.    Patient does not meet criteria for psychiatric inpatient admission. Discussed crisis plan, support from social  network, calling 911, coming to the Emergency Department, and calling Suicide Hotline.  Revonda Humphrey, NP  Louretta Parma is a current 05/27/2021, 8:30 PM

## 2021-05-30 ENCOUNTER — Other Ambulatory Visit: Payer: Self-pay

## 2021-05-30 ENCOUNTER — Encounter (HOSPITAL_COMMUNITY): Payer: Self-pay | Admitting: *Deleted

## 2021-05-30 ENCOUNTER — Emergency Department (HOSPITAL_COMMUNITY)
Admission: EM | Admit: 2021-05-30 | Discharge: 2021-05-30 | Disposition: A | Discharge status: Home / Self Care | Payer: Medicare Other | Attending: Emergency Medicine | Admitting: Emergency Medicine

## 2021-05-30 DIAGNOSIS — Z760 Encounter for issue of repeat prescription: Secondary | ICD-10-CM | POA: Insufficient documentation

## 2021-05-30 DIAGNOSIS — E1165 Type 2 diabetes mellitus with hyperglycemia: Secondary | ICD-10-CM | POA: Diagnosis not present

## 2021-05-30 DIAGNOSIS — M25562 Pain in left knee: Secondary | ICD-10-CM | POA: Diagnosis not present

## 2021-05-30 DIAGNOSIS — I10 Essential (primary) hypertension: Secondary | ICD-10-CM | POA: Insufficient documentation

## 2021-05-30 DIAGNOSIS — Z7984 Long term (current) use of oral hypoglycemic drugs: Secondary | ICD-10-CM | POA: Diagnosis not present

## 2021-05-30 LAB — CBG MONITORING, ED: Glucose-Capillary: 275 mg/dL — ABNORMAL HIGH (ref 70–99)

## 2021-05-30 NOTE — ED Provider Notes (Signed)
[facility]   CSN: [id number] Arrival date & time: 05/30/21  0046     History  Chief Complaint  Patient presents with   Knee Pain    Alejandro Baker is a 46 y.o. male with a past medical history of who presents to the ED complaining of left knee pain onset today.  When asked about knee pain, patient denies pain anywhere at this time.  Denies recent injury, trauma, fall.  Has not taken any medications for his symptoms.  Denies color change, wound, swelling, chest pain, shortness of breath, fever, chills. Patient notes that he is out of his diabetic medications and antihypertensive medications.  Patient notes he has not taken these medications for 3 months.  He does not know his primary care provider is.  Patient is asleep during examination.  Per patient chart review: Patient has metformin that was sent to his pharmacy on 05/27/2021.  Also has Risperdal that was sent to pharmacy on 05/27/2021.  The history is provided by the patient. No language interpreter was used.      Home Medications Prior to Admission medications   Medication Sig Start Date End Date Taking? Authorizing Provider  albuterol (VENTOLIN HFA) 108 (90 Base) MCG/ACT inhaler Inhale 1-2 puffs into the lungs every 6 (six) hours as needed for wheezing or shortness of breath. 05/19/21   Prosperi, Christian H, PA-C  fluticasone (FLONASE) 50 MCG/ACT nasal spray Place 1 spray into both nostrils daily. 05/19/21   Prosperi, Christian H, PA-C  metFORMIN (GLUCOPHAGE) 500 MG tablet Take 1 tablet (500 mg total) by mouth 2 (two) times daily with a meal. 05/27/21   Ardis Hughs, NP  risperiDONE (RISPERDAL) 2 MG tablet Take 1 tablet (2 mg total) by mouth at bedtime. 05/27/21 06/26/21  Ardis Hughs, NP      Allergies    Penicillins and Povidone-iodine    Review of Systems   Review of Systems  Constitutional:  Negative for chills and fever.  Respiratory:  Negative for shortness of  breath.   Cardiovascular:  Negative for chest pain.  Gastrointestinal:  Negative for nausea and vomiting.  Musculoskeletal:  Negative for arthralgias and joint swelling.  Skin:  Negative for rash.  All other systems reviewed and are negative.  Physical Exam Updated Vital Signs BP 120/85    Pulse 72    Temp 97.9 F (36.6 C) (Oral)    Resp 16    Ht 6' (1.829 m)    Wt 80 kg    SpO2 100%    BMI 23.92 kg/m  Physical Exam Vitals and nursing note reviewed.  Constitutional:      General: He is not in acute distress.    Appearance: He is not diaphoretic.  HENT:     Head: Normocephalic and atraumatic.     Mouth/Throat:     Pharynx: No oropharyngeal exudate.  Eyes:     General: No scleral icterus.    Conjunctiva/sclera: Conjunctivae normal.  Cardiovascular:     Rate and Rhythm: Normal rate and regular rhythm.     Pulses: Normal pulses.     Heart sounds: Normal heart sounds.  Pulmonary:     Effort: Pulmonary effort is normal. No respiratory distress.     Breath sounds: Normal breath sounds. No wheezing.  Chest:     Chest wall: No tenderness.  Abdominal:     General: Bowel sounds are normal.     Palpations: Abdomen is soft. There is no mass.  Tenderness: There is no abdominal tenderness. There is no guarding or rebound.  Musculoskeletal:        General: Normal range of motion.     Cervical back: Normal range of motion and neck supple.     Comments: Patient able to ambulate without assistance or difficulty.  Skin:    General: Skin is warm and dry.  Neurological:     Mental Status: He is alert.  Psychiatric:        Behavior: Behavior normal.    ED Results / Procedures / Treatments   Labs (all labs ordered are listed, but only abnormal results are displayed) Labs Reviewed  CBG MONITORING, ED - Abnormal; Notable for the following components:      Result Value   Glucose-Capillary 275 (*)    All other components within normal limits    EKG None  Radiology No results  found.  Procedures Procedures    Medications Ordered in ED Medications - No data to display  ED Course/ Medical Decision Making/ A&P                           Medical Decision Making  Pt presents to the ED with concerns for medication refill at this time. Vital signs stable, patient afebrile, not tachycardic or hypoxic.  Patient denies pain anywhere at this time.  Denies injury or trauma.  Patient notes that he would like a refill of his diabetes and antihypertensive medication and that he is without pain.  On exam, patient without acute cardiovascular, respiratory, or abdominal exam findings.   Upon chart review, pt was prescribed metformin and Risperadal on 05/27/21.  Discussed with patient at bedside that his medications were sent to the pharmacy on 05/27/2021.  Instructed pt to ensure to pick up his Rx. Pt agreeable at this time.  Supportive care measures and strict return precautions discussed with patient.  Patient appears safe for discharge.  Follow-up as indicated discharge paperwork.  Final Clinical Impression(s) / ED Diagnoses Final diagnoses:  Encounter for medication refill    Rx / DC Orders ED Discharge Orders     None         Rashmi Tallent A, PA-C 05/30/21 1355    Derwood Kaplan, MD 05/31/21 [phone]

## 2021-05-30 NOTE — Discharge Instructions (Addendum)
Your medications were sent to your pharmacy on 05/27/2021.  Make sure to pick up your medications and take them as prescribed.  Follow-up with your primary care provider as needed.  Do not have a primary care provider you may follow-up with [place] [facility] wellness as needed.  Return to the ED if you are experiencing increasing or worsening symptoms.

## 2021-05-30 NOTE — ED Triage Notes (Signed)
Pt denies knee pain at this time, falling asleep during triage, responds to verbal. C/o right arm pain, denies injury.

## 2021-05-30 NOTE — ED Triage Notes (Signed)
Called EMS from [employer] with c/o knee pain, laying on bench seat in restaurant. Ambulated to ambulance independently Bilateral knee pain. 142/96, hr 80, [id number]

## 2021-06-02 ENCOUNTER — Emergency Department (HOSPITAL_COMMUNITY)
Admission: EM | Admit: 2021-06-02 | Discharge: 2021-06-02 | Disposition: A | Discharge status: Home / Self Care | Payer: Medicare Other | Attending: Emergency Medicine | Admitting: Emergency Medicine

## 2021-06-02 ENCOUNTER — Encounter (HOSPITAL_COMMUNITY): Payer: Self-pay | Admitting: *Deleted

## 2021-06-02 ENCOUNTER — Other Ambulatory Visit: Payer: Self-pay

## 2021-06-02 ENCOUNTER — Emergency Department (HOSPITAL_COMMUNITY): Payer: Medicare Other

## 2021-06-02 DIAGNOSIS — I10 Essential (primary) hypertension: Secondary | ICD-10-CM | POA: Insufficient documentation

## 2021-06-02 DIAGNOSIS — E1140 Type 2 diabetes mellitus with diabetic neuropathy, unspecified: Secondary | ICD-10-CM | POA: Diagnosis not present

## 2021-06-02 DIAGNOSIS — F17210 Nicotine dependence, cigarettes, uncomplicated: Secondary | ICD-10-CM | POA: Insufficient documentation

## 2021-06-02 DIAGNOSIS — R4182 Altered mental status, unspecified: Secondary | ICD-10-CM | POA: Insufficient documentation

## 2021-06-02 DIAGNOSIS — D72829 Elevated white blood cell count, unspecified: Secondary | ICD-10-CM | POA: Diagnosis not present

## 2021-06-02 DIAGNOSIS — Z7189 Other specified counseling: Secondary | ICD-10-CM | POA: Diagnosis not present

## 2021-06-02 LAB — CBC WITH DIFFERENTIAL/PLATELET
Abs Immature Granulocytes: 0.03 10*3/uL (ref 0.00–0.07)
Basophils Absolute: 0.1 10*3/uL (ref 0.0–0.1)
Basophils Relative: 1 %
Eosinophils Absolute: 0.1 10*3/uL (ref 0.0–0.5)
Eosinophils Relative: 1 %
HCT: 46.7 % (ref 39.0–52.0)
Hemoglobin: 15.6 g/dL (ref 13.0–17.0)
Immature Granulocytes: 0 %
Lymphocytes Relative: 24 %
Lymphs Abs: 2.7 10*3/uL (ref 0.7–4.0)
MCH: 30.6 pg (ref 26.0–34.0)
MCHC: 33.4 g/dL (ref 30.0–36.0)
MCV: 91.6 fL (ref 80.0–100.0)
Monocytes Absolute: 0.9 10*3/uL (ref 0.1–1.0)
Monocytes Relative: 8 %
Neutro Abs: 7.4 10*3/uL (ref 1.7–7.7)
Neutrophils Relative %: 66 %
Platelets: 334 10*3/uL (ref 150–400)
RBC: 5.1 MIL/uL (ref 4.22–5.81)
RDW: 12.6 % (ref 11.5–15.5)
WBC: 11.1 10*3/uL — ABNORMAL HIGH (ref 4.0–10.5)
nRBC: 0 % (ref 0.0–0.2)

## 2021-06-02 LAB — BASIC METABOLIC PANEL
Anion gap: 10 (ref 5–15)
BUN: 13 mg/dL (ref 6–20)
CO2: 26 mmol/L (ref 22–32)
Calcium: 9.1 mg/dL (ref 8.9–10.3)
Chloride: 97 mmol/L — ABNORMAL LOW (ref 98–111)
Creatinine, Ser: 0.66 mg/dL (ref 0.61–1.24)
GFR, Estimated: >60 mL/min (ref >60)
Glucose, Bld: 251 mg/dL — ABNORMAL HIGH (ref 70–99)
Potassium: 4.1 mmol/L (ref 3.5–5.1)
Sodium: 133 mmol/L — ABNORMAL LOW (ref 135–145)

## 2021-06-02 MED ORDER — METFORMIN HCL 500 MG PO TABS
500.0000 mg | ORAL_TABLET | Freq: Two times a day (BID) | ORAL | 0 refills | Status: DC
Start: 2021-06-02 — End: 2023-02-07

## 2021-06-02 NOTE — Discharge Instructions (Signed)
Make your calories count with these nutritious foods. Choose healthy carbohydrates, fiber-rich foods, fish and "good" fats.  Healthy carbohydrates During digestion, sugars (simple carbohydrates) and starches (complex carbohydrates) break down into blood glucose. Focus on healthy carbohydrates, such as:  Fruits Vegetables Whole grains Legumes, such as beans and peas Low-fat dairy products, such as milk and cheese Avoid less healthy carbohydrates, such as foods or drinks with added fats, sugars and sodium.  Fiber-rich foods Dietary fiber includes all parts of plant foods that your body can't digest or absorb. Fiber moderates how your body digests and helps control blood sugar levels. Foods high in fiber include:  Vegetables Fruits Nuts Legumes, such as beans and peas Whole grains Heart-healthy fish Eat heart-healthy fish at least twice a week. Fish such as salmon, mackerel, tuna and sardines are rich in omega-3 fatty acids, which may prevent heart disease.  Avoid fried fish and fish with high levels of mercury, such as king mackerel.  'Good' fats Foods containing monounsaturated and polyunsaturated fats can help lower your cholesterol levels. These include:  Avocados Nuts Canola, olive and peanut oils But don't overdo it, as all fats are high in calories.  Foods to avoid Diabetes increases your risk of heart disease and stroke by accelerating the development of clogged and hardened arteries. Foods containing the following can work against your goal of a heart-healthy diet.  Saturated fats. Avoid high-fat dairy products and animal proteins such as butter, beef, hot dogs, sausage and bacon. Also limit coconut and palm kernel oils. Trans fats. Avoid trans fats found in processed snacks, baked goods, shortening and stick margarines. Cholesterol. Cholesterol sources include high-fat dairy products and high-fat animal proteins, egg yolks, liver, and other organ meats. Aim for no more  than 200 milligrams (mg) of cholesterol a day. Sodium. Aim for less than 2,300 mg of sodium a day. Your doctor may suggest you aim for even less if you have high blood pressure.

## 2021-06-02 NOTE — ED Notes (Signed)
Pt refusing to leave ER room, security at bedside

## 2021-06-02 NOTE — ED Notes (Signed)
Patient provided 2 sandwiches and soda, no difficulty eating

## 2021-06-02 NOTE — ED Provider Notes (Signed)
[facility] Emergency Department Provider Note MRN:  [id number]  Arrival date & time: 06/02/21     Chief Complaint   Altered Mental Status   History of Present Illness   Alejandro Baker is a 46 y.o. year-old male with a history of diabetes, hypertension, schizoaffective disorder presenting to the ED with chief complaint of wanting to be evaluated.  When I evaluated Alejandro Baker he reported that he just felt like he needed to be checked out by a medical professional.  The patient states that he wanted to have his sugar checked and would like information on the nutrients that he should be eating since he is a diabetic.  When I was performing a review of systems the patient reported that he had shortness of breath after walking many miles today but it has since resolved and he feels better now.  He denies that he is having chest pain, abdominal pain, fevers or chills, dizziness, or new rashes.  The patient asked me 3 separate times if there was food available in our emergency department because he felt like he needed to eat something due to his history of diabetes.  The patient reports that he is not homicidal or suicidal at this time.  And he is not experiencing any auditory or visual hallucinations.  The patient states that he lives at a shelter where he has been living recently.  Review of Systems  A thorough review of systems was obtained and all systems are negative except as noted in the HPI and PMH.   Patient's Health History    Past Medical History:  Diagnosis Date   Diabetes mellitus without complication (HCC)    Hypertension    Neuropathy    Schizoaffective disorder, bipolar type (HCC)     History reviewed. No pertinent surgical history.  No family history on file.  Social History   Socioeconomic History   Marital status: Single    Spouse name: Not on file   Number of children: Not on file   Years of education: Not on file   Highest education level:  Not on file  Occupational History   Not on file  Tobacco Use   Smoking status: Every Day    Packs/day: 1.00    Years: 15.00    Pack years: 15.00    Types: Cigarettes   Smokeless tobacco: Never  Substance and Sexual Activity   Alcohol use: Yes   Drug use: Yes   Sexual activity: Not on file  Other Topics Concern   Not on file  Social History Narrative   ** Merged History Encounter **       ** Merged History Encounter **       ** Merged History Encounter **       Social Determinants of Corporate investment banker Strain: Not on file  Food Insecurity: Not on file  Transportation Needs: Not on file  Physical Activity: Not on file  Stress: Not on file  Social Connections: Not on file  Intimate Partner Violence: Not on file     Physical Exam   Physical Exam Vitals and nursing note reviewed.  Constitutional:      Appearance: Normal appearance.  Cardiovascular:     Rate and Rhythm: Normal rate and regular rhythm.     Pulses: Normal pulses.     Heart sounds: Normal heart sounds.  Pulmonary:     Effort: Pulmonary effort is normal.     Breath sounds: Normal breath sounds.  Abdominal:     General: Abdomen is flat.     Palpations: Abdomen is soft.     Tenderness: There is no abdominal tenderness.  Musculoskeletal:        General: No swelling or tenderness.  Skin:    General: Skin is warm and dry.     Capillary Refill: Capillary refill takes less than 2 seconds.     Comments: Dirt in the patient's nails in his hands and feet.  Neurological:     General: No focal deficit present.     Mental Status: He is alert and oriented to person, place, and time. Mental status is at baseline.     Cranial Nerves: No cranial nerve deficit.     Sensory: No sensory deficit.     Motor: No weakness.     Gait: Gait normal.  Psychiatric:        Mood and Affect: Affect is flat.      Diagnostic and Interventional Summary    Labs Reviewed  BASIC METABOLIC PANEL - Abnormal; Notable  for the following components:      Result Value   Sodium 133 (*)    Chloride 97 (*)    Glucose, Bld 251 (*)    All other components within normal limits  CBC WITH DIFFERENTIAL/PLATELET - Abnormal; Notable for the following components:   WBC 11.1 (*)    All other components within normal limits    DG Chest 2 View  Final Result      Medications - No data to display   Procedures  /  Critical Care Procedures  ED Course and Medical Decision Making  Initial Impression and Ddx This is a 46 year old male presents to our emergency department for evaluation stating that he would like a medical professional to evaluate him.  Also requesting information on diabetes education.  Patient did report in his review of systems that he was short of breath on ambulation over long distances.  The patient reports no shortness of breath with orthopnea or acute exertion.  Also denies that he is having chest pain at this time.  Complete neurological exam was performed at bedside.  The patient did have a flat affect but had no focal neurological deficits.  Past medical/surgical history that increases complexity of ED encounter: Schizophrenia  Interpretation of Diagnostics I personally reviewed the Chest Xray and Cardiac Monitor and my interpretation is as follows: Chest x-ray without acute cardiopulmonary process.  Noted to be in normal sinus rhythm on cardiac monitor    Basic metabolic panel with glucose of 251.  No elevation in his anion gap.  Patient has a history of type 2 diabetes this is unsurprising.  Recommended to the patient that he continue to take his metformin.  CBC with mild leukocytosis of 11.1.  Have low concern for infectious etiology at this time.  Suspect this could be related to mild dehydration.  Patient able to tolerate p.o. fluids at bedside.  Patient Reassessment and Ultimate Disposition/Management Feels that the patient is stable for discharge home.  We will refill the patient's  metformin and provide him with diabetes education.  Instructed the patient to follow-up with his primary care provider as well as a psychiatrist.  Patient in agreement this plan.  Patient management required discussion with the following services or consulting groups:  None  Complexity of Problems Addressed Chronic illness with exacerbation  Additional Data Reviewed and Analyzed Further history obtained from: Recent PCP notes and Recent Consult notes  Factors Impacting  ED Encounter Risk Consideration of hospitalization    Final Clinical Impressions(s) / ED Diagnoses     ICD-10-CM   1. Encounter for diabetes education  Z71.89       ED Discharge Orders          Ordered    metFORMIN (GLUCOPHAGE) 500 MG tablet  2 times daily with meals        06/02/21 2021             Discharge Instructions Discussed with and Provided to Patient:     Discharge Instructions      Make your calories count with these nutritious foods. Choose healthy carbohydrates, fiber-rich foods, fish and "good" fats.  Healthy carbohydrates During digestion, sugars (simple carbohydrates) and starches (complex carbohydrates) break down into blood glucose. Focus on healthy carbohydrates, such as:  Fruits Vegetables Whole grains Legumes, such as beans and peas Low-fat dairy products, such as milk and cheese Avoid less healthy carbohydrates, such as foods or drinks with added fats, sugars and sodium.  Fiber-rich foods Dietary fiber includes all parts of plant foods that your body can't digest or absorb. Fiber moderates how your body digests and helps control blood sugar levels. Foods high in fiber include:  Vegetables Fruits Nuts Legumes, such as beans and peas Whole grains Heart-healthy fish Eat heart-healthy fish at least twice a week. Fish such as salmon, mackerel, tuna and sardines are rich in omega-3 fatty acids, which may prevent heart disease.  Avoid fried fish and fish with high levels of  mercury, such as king mackerel.  'Good' fats Foods containing monounsaturated and polyunsaturated fats can help lower your cholesterol levels. These include:  Avocados Nuts Canola, olive and peanut oils But don't overdo it, as all fats are high in calories.  Foods to avoid Diabetes increases your risk of heart disease and stroke by accelerating the development of clogged and hardened arteries. Foods containing the following can work against your goal of a heart-healthy diet.  Saturated fats. Avoid high-fat dairy products and animal proteins such as butter, beef, hot dogs, sausage and bacon. Also limit coconut and palm kernel oils. Trans fats. Avoid trans fats found in processed snacks, baked goods, shortening and stick margarines. Cholesterol. Cholesterol sources include high-fat dairy products and high-fat animal proteins, egg yolks, liver, and other organ meats. Aim for no more than 200 milligrams (mg) of cholesterol a day. Sodium. Aim for less than 2,300 mg of sodium a day. Your doctor may suggest you aim for even less if you have high blood pressure.        [name], MD 06/02/21 2109    Eber Hong, MD 06/03/21 2118

## 2021-06-02 NOTE — ED Provider Triage Note (Signed)
Emergency Medicine Provider Triage Evaluation Note  Alejandro Baker , a 46 y.o. male  was evaluated in triage.  Pt complains of shortness of breath primarily due to the fact that he walks far.  Has not tried medication for his symptoms.  Patient also notes that he would like "information on nutrition to eat better".  Denies chest pain, abdominal pain, fever, chills, nausea, vomiting.  Review of Systems  Positive: As per HPI above Negative: Chest pain  Physical Exam  BP (!) 125/101 (BP Location: Right Arm)    Pulse 97    Temp 98.3 F (36.8 C) (Oral)    Resp 16    Ht 6' (1.829 m)    Wt 80 kg    SpO2 99%    BMI 23.92 kg/m  Gen:   Awake, no distress alert and oriented and able to answer all questions during exam. Resp:  Normal effort  MSK:   Moves extremities without difficulty  Other:  No chest wall tenderness to palpation  Medical Decision Making  Medically screening exam initiated at 6:53 PM.  Appropriate orders placed.  Alejandro Baker was informed that the remainder of the evaluation will be completed by another provider, this initial triage assessment does not replace that evaluation, and the importance of remaining in the ED until their evaluation is complete.   Jacobo Moncrief A, PA-C 06/02/21 1854

## 2021-06-02 NOTE — ED Notes (Signed)
Pt escorted to exit by secuirty

## 2021-06-02 NOTE — ED Triage Notes (Signed)
The pt did not answer the first 3 questions asked very strange acting  he reports that he needs his circuits checked   very dirty  homeless

## 2021-06-11 ENCOUNTER — Encounter (HOSPITAL_COMMUNITY): Payer: Self-pay | Admitting: Emergency Medicine

## 2021-06-11 ENCOUNTER — Other Ambulatory Visit: Payer: Self-pay

## 2021-06-11 ENCOUNTER — Emergency Department (HOSPITAL_COMMUNITY)
Admission: EM | Admit: 2021-06-11 | Discharge: 2021-06-12 | Disposition: A | Discharge status: Home / Self Care | Payer: Medicare Other | Attending: Emergency Medicine | Admitting: Emergency Medicine

## 2021-06-11 ENCOUNTER — Emergency Department (HOSPITAL_COMMUNITY)
Admission: EM | Admit: 2021-06-11 | Discharge: 2021-06-11 | Discharge status: Absent without leave | Payer: Medicare Other

## 2021-06-11 DIAGNOSIS — X31XXXA Exposure to excessive natural cold, initial encounter: Secondary | ICD-10-CM | POA: Diagnosis not present

## 2021-06-11 DIAGNOSIS — Z7984 Long term (current) use of oral hypoglycemic drugs: Secondary | ICD-10-CM | POA: Insufficient documentation

## 2021-06-11 DIAGNOSIS — S90812A Abrasion, left foot, initial encounter: Secondary | ICD-10-CM | POA: Insufficient documentation

## 2021-06-11 DIAGNOSIS — S99922A Unspecified injury of left foot, initial encounter: Secondary | ICD-10-CM | POA: Diagnosis present

## 2021-06-11 DIAGNOSIS — S90811A Abrasion, right foot, initial encounter: Secondary | ICD-10-CM | POA: Insufficient documentation

## 2021-06-11 DIAGNOSIS — T691XXA Chilblains, initial encounter: Secondary | ICD-10-CM | POA: Diagnosis not present

## 2021-06-11 DIAGNOSIS — E119 Type 2 diabetes mellitus without complications: Secondary | ICD-10-CM | POA: Diagnosis not present

## 2021-06-11 DIAGNOSIS — M79643 Pain in unspecified hand: Secondary | ICD-10-CM | POA: Insufficient documentation

## 2021-06-11 NOTE — ED Notes (Signed)
Per SORT tech pt stepped outside

## 2021-06-11 NOTE — ED Triage Notes (Signed)
Pt reports that he has a bad foot, needs blood work done and needs his heart rate checked.

## 2021-06-11 NOTE — ED Notes (Signed)
No answer in lobby.

## 2021-06-12 LAB — CBG MONITORING, ED: Glucose-Capillary: 181 mg/dL — ABNORMAL HIGH (ref 70–99)

## 2021-06-12 MED ORDER — DOXYCYCLINE HYCLATE 100 MG PO CAPS: 100.0000 mg | ORAL_CAPSULE | Freq: Two times a day (BID) | ORAL | 0 refills | Status: DC

## 2021-06-12 NOTE — ED Provider Notes (Signed)
[facility] EMERGENCY DEPARTMENT Provider Note   CSN: [id number] Arrival date & time: 06/11/21  2208     History  Chief Complaint  Patient presents with   Foot Pain    Alejandro Baker is a 46 y.o. male.  Patient is a 46 year old diabetic who presents to the emergency department stating that he has problems with his feet.  He reports that he has had pain, swelling and wounds for some time.      Home Medications Prior to Admission medications   Medication Sig Start Date End Date Taking? Authorizing Provider  doxycycline (VIBRAMYCIN) 100 MG capsule Take 1 capsule (100 mg total) by mouth 2 (two) times daily. 06/12/21  Yes Yuya Vanwingerden, Gwenyth Allegra, MD  albuterol (VENTOLIN HFA) 108 (90 Base) MCG/ACT inhaler Inhale 1-2 puffs into the lungs every 6 (six) hours as needed for wheezing or shortness of breath. 05/19/21   Prosperi, Christian H, PA-C  fluticasone (FLONASE) 50 MCG/ACT nasal spray Place 1 spray into both nostrils daily. 05/19/21   Prosperi, Christian H, PA-C  metFORMIN (GLUCOPHAGE) 500 MG tablet Take 1 tablet (500 mg total) by mouth 2 (two) times daily with a meal. 06/02/21   Zachery Dakins, MD  risperiDONE (RISPERDAL) 2 MG tablet Take 1 tablet (2 mg total) by mouth at bedtime. 05/27/21 06/26/21  Revonda Humphrey, NP      Allergies    Penicillins and Povidone-iodine    Review of Systems   Review of Systems  Skin:  Positive for wound.   Physical Exam Updated Vital Signs BP (!) 121/99    Pulse (!) 112    Temp 98.3 F (36.8 C) (Oral)    Resp 16    SpO2 96%  Physical Exam Vitals and nursing note reviewed.  Constitutional:      General: He is not in acute distress.    Appearance: He is well-developed.  HENT:     Head: Normocephalic and atraumatic.     Mouth/Throat:     Mouth: Mucous membranes are moist.  Eyes:     General: Vision grossly intact. Gaze aligned appropriately.     Extraocular Movements: Extraocular movements intact.     Conjunctiva/sclera:  Conjunctivae normal.  Cardiovascular:     Rate and Rhythm: Normal rate and regular rhythm.     Pulses: Normal pulses.     Heart sounds: Normal heart sounds, S1 normal and S2 normal. No murmur heard.   No friction rub. No gallop.  Pulmonary:     Effort: Pulmonary effort is normal. No respiratory distress.     Breath sounds: Normal breath sounds.  Abdominal:     Palpations: Abdomen is soft.     Tenderness: There is no abdominal tenderness. There is no guarding or rebound.     Hernia: No hernia is present.  Musculoskeletal:        General: No swelling.     Cervical back: Full passive range of motion without pain, normal range of motion and neck supple. No pain with movement, spinous process tenderness or muscular tenderness. Normal range of motion.     Right lower leg: No edema.     Left lower leg: No edema.  Skin:    General: Skin is warm and dry.     Capillary Refill: Capillary refill takes less than 2 seconds.     Findings: Erythema present. No ecchymosis, lesion or wound.     Comments: Diffuse erythema of the plantar aspects of both feet with some abrasions, dry  skin, cracking and filth adherent  Neurological:     Mental Status: He is alert and oriented to person, place, and time.     GCS: GCS eye subscore is 4. GCS verbal subscore is 5. GCS motor subscore is 6.     Cranial Nerves: Cranial nerves 2-12 are intact.     Sensory: Sensation is intact.     Motor: Motor function is intact. No weakness or abnormal muscle tone.     Coordination: Coordination is intact.  Psychiatric:        Mood and Affect: Mood normal.        Speech: Speech normal.        Behavior: Behavior normal.           ED Results / Procedures / Treatments   Labs (all labs ordered are listed, but only abnormal results are displayed) Labs Reviewed  CBG MONITORING, ED - Abnormal; Notable for the following components:      Result Value   Glucose-Capillary 181 (*)    All other components within normal  limits    EKG None  Radiology No results found.  Procedures Procedures    Medications Ordered in ED Medications - No data to display  ED Course/ Medical Decision Making/ A&P                           Medical Decision Making  Patient presents with complaints of problems with his feet.  He has wet socks on, has been walking in the rain.  He does have some maceration of the plantar aspect of the feet.  There is some erythema that does not appear infectious, likely reactive.  Symptoms consistent with very mild chilblains.  He is a diabetic.  Sugars reasonably well controlled.  There does not appear to be any active infection but he does have some abrasions and dry, cracked skin on the bottom of his feet.  Will prescribe doxycycline to prevent infection.        Final Clinical Impression(s) / ED Diagnoses Final diagnoses:  Chilblains, initial encounter    Rx / DC Orders ED Discharge Orders          Ordered    doxycycline (VIBRAMYCIN) 100 MG capsule  2 times daily        06/12/21 0046              Orpah Greek, MD 06/12/21 [phone]

## 2021-06-14 ENCOUNTER — Telehealth (HOSPITAL_COMMUNITY): Payer: Self-pay | Admitting: Emergency Medicine

## 2021-06-14 NOTE — BH Assessment (Signed)
Care Management - [facility] Follow Up Discharges  ° °Writer attempted to make contact with patient today and was unsuccessful.  No phone number listed in epic. ° °Per chart review, patient was provided with outpatient resources. ° °

## 2021-06-22 ENCOUNTER — Encounter (HOSPITAL_COMMUNITY): Payer: Self-pay | Admitting: Emergency Medicine

## 2021-06-22 ENCOUNTER — Emergency Department (HOSPITAL_COMMUNITY)
Admission: EM | Admit: 2021-06-22 | Discharge: 2021-06-22 | Disposition: A | Discharge status: Home / Self Care | Payer: Medicare Other | Attending: Emergency Medicine | Admitting: Emergency Medicine

## 2021-06-22 DIAGNOSIS — R109 Unspecified abdominal pain: Secondary | ICD-10-CM | POA: Insufficient documentation

## 2021-06-22 DIAGNOSIS — Z5321 Procedure and treatment not carried out due to patient leaving prior to being seen by health care provider: Secondary | ICD-10-CM | POA: Insufficient documentation

## 2021-06-22 DIAGNOSIS — Z5900 Homelessness unspecified: Secondary | ICD-10-CM | POA: Diagnosis not present

## 2021-06-22 MED ORDER — LIDOCAINE HCL 1 % IJ SOLN
INTRAMUSCULAR | Status: AC
  Filled 2021-06-22: qty 20

## 2021-06-22 NOTE — ED Triage Notes (Signed)
BIBA Per EMS: Pt homeless, security called EMS because they wanted pt to leave. Pt c/o abd pain.

## 2021-07-10 ENCOUNTER — Emergency Department (HOSPITAL_COMMUNITY)
Admission: EM | Admit: 2021-07-10 | Discharge: 2021-07-10 | Disposition: A | Discharge status: Home / Self Care | Payer: Medicare Other | Attending: Emergency Medicine | Admitting: Emergency Medicine

## 2021-07-10 DIAGNOSIS — M79672 Pain in left foot: Secondary | ICD-10-CM | POA: Insufficient documentation

## 2021-07-10 DIAGNOSIS — M79671 Pain in right foot: Secondary | ICD-10-CM | POA: Insufficient documentation

## 2021-07-10 NOTE — ED Notes (Signed)
C/o bilat feet pain  x 2 days. Sole of feet are red and dirty and have callus on the bottom, dorsalis pedis present on bilat feet ?

## 2021-07-10 NOTE — ED Notes (Signed)
Attempting to d/c pt and pt will not get up to receive d/c paperwork or to get ready to leave. Security paged and made ED provider aware of same. Provider came to bedside to pt to get pt awake and receive d/c papers ?

## 2021-07-10 NOTE — ED Notes (Signed)
Security at bedside now. Pt still will not get up and get ready to leave ?

## 2021-07-10 NOTE — ED Provider Notes (Signed)
?[facility] ?Provider Note ? ? ?CSN: [id number] ?Arrival date & time: 07/10/21  0331 ? ?  ? ?History ? ?Chief Complaint  ?Patient presents with  ? Foot Pain  ? ? ?Alejandro Baker is a 45 y.o. male. ? ?46 year old male who is generally homeless the presents with persistent foot pain.  Patient states that this is similar to previous episodes.  States he is really here because he is cold and tired.  I discussed letting him take a nap and he stated that was all he really wanted.  ? ? ?Foot Pain ? ? ?  ? ?Home Medications ?Prior to Admission medications   ?Medication Sig Start Date End Date Taking? Authorizing Provider  ?albuterol (VENTOLIN HFA) 108 (90 Base) MCG/ACT inhaler Inhale 1-2 puffs into the lungs every 6 (six) hours as needed for wheezing or shortness of breath. 05/19/21   Prosperi, Christian H, PA-C  ?doxycycline (VIBRAMYCIN) 100 MG capsule Take 1 capsule (100 mg total) by mouth 2 (two) times daily. 06/12/21   Orpah Greek, MD  ?fluticasone (FLONASE) 50 MCG/ACT nasal spray Place 1 spray into both nostrils daily. 05/19/21   Prosperi, Christian H, PA-C  ?metFORMIN (GLUCOPHAGE) 500 MG tablet Take 1 tablet (500 mg total) by mouth 2 (two) times daily with a meal. 06/02/21   Zachery Dakins, MD  ?risperiDONE (RISPERDAL) 2 MG tablet Take 1 tablet (2 mg total) by mouth at bedtime. 05/27/21 06/26/21  Revonda Humphrey, NP  ?   ? ?Allergies    ?Penicillins and Povidone-iodine   ? ?Review of Systems   ?Review of Systems ? ?Physical Exam ?Updated Vital Signs ?BP 107/73 (BP Location: Right Arm)   Pulse 72   Temp 98 ?F (36.7 ?C) (Oral)   Resp 16   SpO2 99%  ?Physical Exam ?Vitals and nursing note reviewed.  ?Constitutional:   ?   Appearance: He is well-developed.  ?HENT:  ?   Head: Normocephalic and atraumatic.  ?   Mouth/Throat:  ?   Mouth: Mucous membranes are moist.  ?   Pharynx: Oropharynx is clear.  ?Eyes:  ?   Pupils: Pupils are equal, round, and reactive to light.   ?Cardiovascular:  ?   Rate and Rhythm: Normal rate.  ?Pulmonary:  ?   Effort: Pulmonary effort is normal. No respiratory distress.  ?Abdominal:  ?   General: Abdomen is flat. There is no distension.  ?Musculoskeletal:     ?   General: Normal range of motion.  ?   Cervical back: Normal range of motion.  ?Skin: ?   General: Skin is warm and dry.  ?   Comments: Mild erythema to bilateral lower feet.  ?Neurological:  ?   General: No focal deficit present.  ?   Mental Status: He is alert.  ? ? ?ED Results / Procedures / Treatments   ?Labs ?(all labs ordered are listed, but only abnormal results are displayed) ?Labs Reviewed - No data to display ? ?EKG ?None ? ?Radiology ?No results found. ? ?Procedures ?Procedures  ? ? ?Medications Ordered in ED ?Medications - No data to display ? ?ED Course/ Medical Decision Making/ A&P ?  ?                        ?Medical Decision Making ? ?No obvious emergent condition. Patient stable for discharge. Sandwich/drink provided.  ? ?Final Clinical Impression(s) / ED Diagnoses ?Final diagnoses:  ?Foot pain, right  ?Foot pain,  left  ? ? ?Rx / DC Orders ?ED Discharge Orders   ? ? None  ? ?  ? ? ?  ?Merrily Pew, MD ?07/10/21 [id number] ? ?

## 2021-07-10 NOTE — ED Triage Notes (Signed)
Pt arrives via [facility] for c/o foot pain. Pt recently diagnosed with chilblains per notes from ED visit. Non-compliant with abx given at that time. During triage pt noted to have reddened, callous feet, no open sores noted. Denies recent trauma.  ?

## 2021-08-09 ENCOUNTER — Encounter (HOSPITAL_COMMUNITY): Payer: Self-pay | Admitting: Emergency Medicine

## 2021-08-09 ENCOUNTER — Other Ambulatory Visit: Payer: Self-pay

## 2021-08-09 ENCOUNTER — Emergency Department (HOSPITAL_COMMUNITY)
Admission: EM | Admit: 2021-08-09 | Discharge: 2021-08-09 | Disposition: A | Discharge status: Home / Self Care | Payer: Medicare Other | Attending: Emergency Medicine | Admitting: Emergency Medicine

## 2021-08-09 DIAGNOSIS — M79672 Pain in left foot: Secondary | ICD-10-CM | POA: Diagnosis not present

## 2021-08-09 DIAGNOSIS — E1140 Type 2 diabetes mellitus with diabetic neuropathy, unspecified: Secondary | ICD-10-CM | POA: Insufficient documentation

## 2021-08-09 DIAGNOSIS — M79671 Pain in right foot: Secondary | ICD-10-CM | POA: Insufficient documentation

## 2021-08-09 DIAGNOSIS — Z7984 Long term (current) use of oral hypoglycemic drugs: Secondary | ICD-10-CM | POA: Insufficient documentation

## 2021-08-09 DIAGNOSIS — I10 Essential (primary) hypertension: Secondary | ICD-10-CM | POA: Insufficient documentation

## 2021-08-09 NOTE — ED Provider Notes (Signed)
?[facility] EMERGENCY DEPARTMENT ?Provider Note ? ? ?CSN: [id number] ?Arrival date & time: 08/09/21  0245 ? ?  ? ?History ? ?Chief Complaint  ?Patient presents with  ? Foot Pain  ? ? ?Alejandro Baker is a 46 y.o. male. ? ?HPI ? ?  ? ?This is a 46 year old male who presents with bilateral foot pain.  Patient reports ongoing bilateral foot pain.  He will not provide much history.  Reports that "sometimes I have socks."  He states that he has pain worse with ambulation.  He is currently homeless.  He does a lot of walking.  Denies systemic illness such as fever. ? ?Home Medications ?Prior to Admission medications   ?Medication Sig Start Date End Date Taking? Authorizing Provider  ?albuterol (VENTOLIN HFA) 108 (90 Base) MCG/ACT inhaler Inhale 1-2 puffs into the lungs every 6 (six) hours as needed for wheezing or shortness of breath. 05/19/21   Prosperi, Christian H, PA-C  ?doxycycline (VIBRAMYCIN) 100 MG capsule Take 1 capsule (100 mg total) by mouth 2 (two) times daily. 06/12/21   Gilda Crease, MD  ?fluticasone (FLONASE) 50 MCG/ACT nasal spray Place 1 spray into both nostrils daily. 05/19/21   Prosperi, Christian H, PA-C  ?metFORMIN (GLUCOPHAGE) 500 MG tablet Take 1 tablet (500 mg total) by mouth 2 (two) times daily with a meal. 06/02/21   [name], MD  ?risperiDONE (RISPERDAL) 2 MG tablet Take 1 tablet (2 mg total) by mouth at bedtime. 05/27/21 06/26/21  Ardis Hughs, NP  ?   ? ?Allergies    ?Penicillins and Povidone-iodine   ? ?Review of Systems   ?Review of Systems  ?Constitutional:  Negative for fever.  ?Musculoskeletal:   ?     Foot pain  ?Skin:  Negative for color change and wound.  ?All other systems reviewed and are negative. ? ?Physical Exam ?Updated Vital Signs ?BP (!) 138/97   Pulse 84   Temp 98.1 ?F (36.7 ?C)   SpO2 98%  ?Physical Exam ?Vitals and nursing note reviewed.  ?Constitutional:   ?   Appearance: He is well-developed.  ?   Comments: Disheveled appearing but  nontoxic  ?HENT:  ?   Head: Normocephalic and atraumatic.  ?   Mouth/Throat:  ?   Mouth: Mucous membranes are moist.  ?Eyes:  ?   Pupils: Pupils are equal, round, and reactive to light.  ?Cardiovascular:  ?   Rate and Rhythm: Normal rate and regular rhythm.  ?Pulmonary:  ?   Effort: Pulmonary effort is normal. No respiratory distress.  ?Abdominal:  ?   Palpations: Abdomen is soft.  ?Musculoskeletal:  ?   Comments: Focused examination of the bilateral feet without deformity, 2+ DP pulses bilaterally, patient with early callus formation and blistering noted mostly over the balls of the feet, no wounds or ulcerations noted  ?Skin: ?   General: Skin is warm and dry.  ?Neurological:  ?   Mental Status: He is alert and oriented to person, place, and time.  ?Psychiatric:     ?   Mood and Affect: Mood normal.  ? ? ?ED Results / Procedures / Treatments   ?Labs ?(all labs ordered are listed, but only abnormal results are displayed) ?Labs Reviewed - No data to display ? ?EKG ?None ? ?Radiology ?No results found. ? ?Procedures ?Procedures  ? ? ?Medications Ordered in ED ?Medications - No data to display ? ?ED Course/ Medical Decision Making/ A&P ?  ?                        ?  Medical Decision Making ? ?This patient presents to the ED for concern of bilateral foot pain, this involves an extensive number of treatment options, and is a complaint that carries with it a high risk of complications and morbidity.  The differential diagnosis includes wound, ulcers, trench foot, blistering, neuropathy ? ?MDM:   ? ?Patient presents with bilateral foot pain.  He is nontoxic and vital signs are notable for blood pressure of 138/97.  Exams of the bilateral feet reveal early callus formation and blistering.  This is likely related to moisture and lack of socks.  He does not appear to have overt trench foot at this time.  Patient was provided with a basin to clean his feet and several pairs of warm socks.  Discussed with him the importance of  keeping his feet dry and clean.  No wounds and no obvious infection at this time. ?(Labs, imaging) ? ?Labs: ?I Ordered, and personally interpreted labs.  The pertinent results include: None ? ?Imaging Studies ordered: ?I ordered imaging studies including none ?I independently visualized and interpreted imaging. ?I agree with the radiologist interpretation ? ?Additional history obtained from chart review.  External records from outside source obtained and reviewed including prior evaluation ? ?Critical Interventions: ?Warm socks ? ?Consultations: ?I requested consultation with the NA,  and discussed lab and imaging findings as well as pertinent plan - they recommend: N/A ? ?Cardiac Monitoring: ?The patient was maintained on a cardiac monitor.  I personally viewed and interpreted the cardiac monitored which showed an underlying rhythm of: N/A ? ?Reevaluation: ?After the interventions noted above, I reevaluated the patient and found that they have :stayed the same ? ? ?Considered admission for: N/A ? ?Social Determinants of Health: ?Homeless ? ?Disposition: Discharge ? ?Co morbidities that complicate the patient evaluation ? ?Past Medical History:  ?Diagnosis Date  ? Diabetes mellitus without complication (HCC)   ? Hypertension   ? Neuropathy   ? Schizoaffective disorder, bipolar type (HCC)   ?  ? ?Medicines ?No orders of the defined types were placed in this encounter. ?  ?I have reviewed the patients home medicines and have made adjustments as needed ? ?Problem List / ED Course: ?Problem List Items Addressed This Visit   ?None ?Visit Diagnoses   ? ? Foot pain, bilateral    -  Primary  ? ?  ?  ? ? ? ? ? ? ? ? ? ? ? ? ?Final Clinical Impression(s) / ED Diagnoses ?Final diagnoses:  ?Foot pain, bilateral  ? ? ?Rx / DC Orders ?ED Discharge Orders   ? ? None  ? ?  ? ? ?  ?Shon Baton, MD ?08/09/21 0515 ? ?

## 2021-08-09 NOTE — ED Notes (Signed)
Patient requesting bus pass.  Bus pass given. ?

## 2021-08-09 NOTE — Discharge Instructions (Signed)
You were seen today for foot pain.  It is important that you wear good fitting shoes and keep your feet clean and dry.  This will prevent wounds and blistering. ?

## 2021-08-09 NOTE — ED Notes (Signed)
Patient with foot odor.  Basin of water and bottles of soap given for patient to wash feet per MD request.  Hospital socks given per MD request.  Ice water given at patient's request. ?

## 2021-08-09 NOTE — ED Triage Notes (Signed)
Patient from the street, stating he has bilateral leg pain.  This has been going on for three months.  He states that he feels like both of his feet are broken. Per EMS, patient ambulated to the ambulance and to the wheelchair. ?

## 2021-08-21 ENCOUNTER — Other Ambulatory Visit: Payer: Self-pay

## 2021-08-21 ENCOUNTER — Encounter (HOSPITAL_COMMUNITY): Payer: Self-pay

## 2021-08-21 ENCOUNTER — Emergency Department (HOSPITAL_COMMUNITY)
Admission: EM | Admit: 2021-08-21 | Discharge: 2021-08-22 | Discharge status: Against Medical Advice | Payer: Medicare Other | Attending: Emergency Medicine | Admitting: Emergency Medicine

## 2021-08-21 DIAGNOSIS — Z5321 Procedure and treatment not carried out due to patient leaving prior to being seen by health care provider: Secondary | ICD-10-CM | POA: Diagnosis not present

## 2021-08-21 DIAGNOSIS — R519 Headache, unspecified: Secondary | ICD-10-CM | POA: Diagnosis present

## 2021-08-21 NOTE — ED Triage Notes (Signed)
Pt BIB EMS with reports of headache since today and wants to be placed back on his medications.  ?

## 2021-08-22 NOTE — ED Notes (Signed)
Pt called x3 in ED lobby. Currently not present and did not respond to name call at noted times, triage RN notified. Pt could not be located within ED lobby.  °

## 2021-08-22 NOTE — ED Notes (Signed)
Pt called x2 in ED lobby. Pt could not be located within ED lobby, and did not respond to name call.   °

## 2021-08-22 NOTE — ED Notes (Signed)
Pt called x1 in ED lobby. Pt could not be located within ED lobby, and did not respond to name call.   °

## 2021-10-29 ENCOUNTER — Emergency Department (HOSPITAL_COMMUNITY)
Admission: EM | Admit: 2021-10-29 | Discharge: 2021-10-29 | Discharge status: Against Medical Advice | Payer: Medicare Other | Attending: Emergency Medicine | Admitting: Emergency Medicine

## 2021-10-29 ENCOUNTER — Encounter (HOSPITAL_COMMUNITY): Payer: Self-pay | Admitting: Emergency Medicine

## 2021-10-29 DIAGNOSIS — F10129 Alcohol abuse with intoxication, unspecified: Secondary | ICD-10-CM | POA: Diagnosis present

## 2021-10-29 DIAGNOSIS — Z5321 Procedure and treatment not carried out due to patient leaving prior to being seen by health care provider: Secondary | ICD-10-CM | POA: Insufficient documentation

## 2021-10-29 NOTE — ED Provider Triage Note (Signed)
Emergency Medicine Provider Triage Evaluation Note  Kyriakos Babler , a 46 y.o. male  was evaluated in triage.  Pt complains of alcohol intoxication.  Was brought in by EMS [place] and [place] by a civilian.  He denies any symptoms currently.  States that he drank a case and a half of beer over the past 12 hours or so.  He denies any history of DTs or seizures  Review of Systems  Positive: Alcohol intoxication Negative: Fever  Physical Exam  BP (!) 135/93 (BP Location: Right Arm)   Pulse 87   Temp 97.8 F (36.6 C) (Oral)   Resp 18   SpO2 98% Comment: Simultaneous filing. User may not have seen previous data. Gen:   Awake, no distress  \disheveled Resp:  Normal effort  MSK:   Moves extremities without difficulty  Other:    Medical Decision Making  Medically screening exam initiated at 12:09 PM.  Appropriate orders placed.  Rajohn Henery was informed that the remainder of the evaluation will be completed by another provider, this initial triage assessment does not replace that evaluation, and the importance of remaining in the ED until their evaluation is complete.  Patient is clinically intoxicated.  No acute distress.  Will outpatient to metabolize   Gailen Shelter, [place] 10/29/21 1210

## 2021-10-29 NOTE — ED Notes (Signed)
Called for vital sign recheck x1 without answer.

## 2021-10-29 NOTE — ED Triage Notes (Signed)
Per EMs- patient [place] and a Bystander called EMS  Patient c/o "feeling tired."  Patient received a NS 500 ml bolus Patient is non compliant with meds for diabetes and schizophrenia

## 2021-11-01 ENCOUNTER — Encounter (HOSPITAL_COMMUNITY): Payer: Self-pay

## 2021-11-01 ENCOUNTER — Other Ambulatory Visit: Payer: Self-pay

## 2021-11-01 ENCOUNTER — Emergency Department (HOSPITAL_COMMUNITY)
Admission: EM | Admit: 2021-11-01 | Discharge: 2021-11-02 | Disposition: A | Discharge status: Home / Self Care | Payer: Medicare Other | Attending: Emergency Medicine | Admitting: Emergency Medicine

## 2021-11-01 DIAGNOSIS — X58XXXA Exposure to other specified factors, initial encounter: Secondary | ICD-10-CM | POA: Insufficient documentation

## 2021-11-01 DIAGNOSIS — R7309 Other abnormal glucose: Secondary | ICD-10-CM | POA: Insufficient documentation

## 2021-11-01 DIAGNOSIS — S91101A Unspecified open wound of right great toe without damage to nail, initial encounter: Secondary | ICD-10-CM | POA: Diagnosis not present

## 2021-11-01 NOTE — ED Triage Notes (Signed)
Pt BIB EMS with c/o BL foot pain. Pt has blister on left foot. Pt states he has been walking a lot in his wet shoes. Hx diabetes, CBG 416

## 2021-11-02 ENCOUNTER — Emergency Department (HOSPITAL_COMMUNITY): Payer: Medicare Other

## 2021-11-02 LAB — CBG MONITORING, ED: Glucose-Capillary: 288 mg/dL — ABNORMAL HIGH (ref 70–99)

## 2021-11-02 MED ORDER — BACITRACIN ZINC 500 UNIT/GM EX OINT
TOPICAL_OINTMENT | Freq: Two times a day (BID) | CUTANEOUS | Status: DC
  Administered 2021-11-02: 1 via TOPICAL
  Filled 2021-11-02: qty 0.9

## 2021-11-02 MED ORDER — SULFAMETHOXAZOLE-TRIMETHOPRIM 800-160 MG PO TABS: 1.0000 | ORAL_TABLET | Freq: Two times a day (BID) | ORAL | 0 refills | Status: AC

## 2021-11-02 MED ORDER — SULFAMETHOXAZOLE-TRIMETHOPRIM 800-160 MG PO TABS
1.0000 | ORAL_TABLET | Freq: Once | ORAL | Status: AC
  Administered 2021-11-02: 1 via ORAL
  Filled 2021-11-02: qty 1

## 2021-11-02 NOTE — Discharge Instructions (Signed)
You were seen in the ER today for the wounds on your feet.  These were dressed and you have been prescribed antibiotics to treat the wound in your great toe please take it as prescribed for the entire course.  Follow-up in the emergency department with your primary care doctor as needed.  Return to the ER with any puslike drainage from the area, redness, swelling, fevers, chills, or any other new severe symptom

## 2021-11-02 NOTE — ED Provider Notes (Signed)
[facility]-EMERGENCY DEPT Provider Note   CSN: [id number] Arrival date & time: 11/01/21  2320     History  Chief Complaint  Patient presents with   Blister    Alejandro Baker is a 46 y.o. male  who is homeless and has frequent emergency department visits for foot pain who presents today for request for antibiotics due to worsening pain in his feet bilaterally after wearing wet shoes in the rain tonight.  He denies fevers, chills, puslike drainage from the area, or nausea or vomiting.  I personally reviewed this patient's medical records previous history of polysubstance abuse and schizoaffective disorder.  He is not currently on any medications daily.  HPI     Home Medications Prior to Admission medications   Medication Sig Start Date End Date Taking? Authorizing Provider  sulfamethoxazole-trimethoprim (BACTRIM DS) 800-160 MG tablet Take 1 tablet by mouth 2 (two) times daily for 7 days. 11/02/21 11/09/21 Yes Alejandro Navis R, PA-C  albuterol (VENTOLIN HFA) 108 (90 Base) MCG/ACT inhaler Inhale 1-2 puffs into the lungs every 6 (six) hours as needed for wheezing or shortness of breath. 05/19/21   Baker, Alejandro H, PA-C  fluticasone (FLONASE) 50 MCG/ACT nasal spray Place 1 spray into both nostrils daily. 05/19/21   Baker, Alejandro H, PA-C  metFORMIN (GLUCOPHAGE) 500 MG tablet Take 1 tablet (500 mg total) by mouth 2 (two) times daily with a meal. 06/02/21   Alejandro Li, MD  risperiDONE (RISPERDAL) 2 MG tablet Take 1 tablet (2 mg total) by mouth at bedtime. 05/27/21 06/26/21  Alejandro Hughs, NP      Allergies    Penicillins and Povidone-iodine    Review of Systems   Review of Systems  Skin:  Positive for wound.    Physical Exam Updated Vital Signs BP 128/86   Pulse 83   Temp 98.2 F (36.8 C) (Oral)   Resp 18   Ht 5\' 7"  (1.702 m)   Wt 65.8 kg   SpO2 96%   BMI 22.71 kg/m  Physical Exam Vitals and nursing note reviewed.  Constitutional:       Appearance: He is not ill-appearing or toxic-appearing.  HENT:     Head: Normocephalic and atraumatic.     Mouth/Throat:     Mouth: Mucous membranes are moist.     Pharynx: No oropharyngeal exudate or posterior oropharyngeal erythema.  Eyes:     General:        Right eye: No discharge.        Left eye: No discharge.     Conjunctiva/sclera: Conjunctivae normal.  Cardiovascular:     Rate and Rhythm: Normal rate and regular rhythm.     Pulses: Normal pulses.     Heart sounds: Normal heart sounds. No murmur heard. Pulmonary:     Effort: Pulmonary effort is normal. No respiratory distress.     Breath sounds: Normal breath sounds. No wheezing or rales.  Abdominal:     General: Bowel sounds are normal. There is no distension.     Palpations: Abdomen is soft.     Tenderness: There is no abdominal tenderness. There is no guarding or rebound.  Musculoskeletal:        General: No deformity.     Cervical back: Neck supple.       Feet:  Feet:     Comments: Normal capillary refill in all digits, 2+ pedal pulses bilaterally.  Skin:    General: Skin is warm and dry.  Capillary Refill: Capillary refill takes less than 2 seconds.  Neurological:     General: No focal deficit present.     Mental Status: He is alert and oriented to person, place, and time. Mental status is at baseline.  Psychiatric:        Mood and Affect: Mood normal.     ED Results / Procedures / Treatments   Labs (all labs ordered are listed, but only abnormal results are displayed) Labs Reviewed  CBG MONITORING, ED - Abnormal; Notable for the following components:      Result Value   Glucose-Capillary 288 (*)    All other components within normal limits    EKG None  Radiology DG Toe Great Right  Result Date: 11/02/2021 CLINICAL DATA:  First toe wound, initial encounter EXAM: RIGHT GREAT TOE COMPARISON:  10/08/2021 FINDINGS: Erosive changes are again identified in the distal aspect of the first proximal  phalanx stable from the prior exam which may represent underlying gout. No acute fracture or dislocation is noted. No bony erosive changes are identified to correspond with the soft tissue wound. IMPRESSION: Chronic changes stable from the prior exam. No acute erosive changes to suggest osteomyelitis are noted. Electronically Signed   By: Alcide Clever M.D.   On: 11/02/2021 03:49    Procedures Procedures   Medications Ordered in ED Medications  bacitracin ointment (1 Application Topical Provided for home use 11/02/21 0410)  sulfamethoxazole-trimethoprim (BACTRIM DS) 800-160 MG per tablet 1 tablet (1 tablet Oral Given 11/02/21 0408)    ED Course/ Medical Decision Making/ A&P                           Medical Decision Making 46 year old male with bilateral foot pain.  Vital signs are normal and intake.  Cardiopulmonary abdominal exams are benign.  Patient is neurovascular intact in all extremities.  Maceration of the soles of bilateral feet without any evidence of open wound on the left foot but with open wound to the distal dorsum of the right great toe without purulent drainage but with mild surrounding erythema.  Amount and/or Complexity of Data Reviewed Radiology: ordered.    Details: Plain film of the right great toe without evidence of acute osteomyelitis or other changes in the toe, stable from prior x-ray.  Risk OTC drugs. Prescription drug management.   Clinical presentation of wound on the right foot concerning for possible infection.  Will start on antibiotics recommend close follow-up in the outpatient setting.  Patient states he has shelter resources already.  No evidence of systemic infection or progression of the infection proximal to the wound on the right great toe.  Strict turn precautions were given.  No further work-up warranted in the ER at this time.  Alejandro Baker  voiced understanding of his medical evaluation and treatment plan. Each of their questions answered to their  expressed satisfaction.  Return precautions were given.  Patient is well-appearing, stable, and was discharged in good condition. This chart was dictated using voice recognition software, Dragon. Despite the best efforts of this provider to proofread and correct errors, errors may still occur which can change documentation meaning.    Final Clinical Impression(s) / ED Diagnoses Final diagnoses:  Open wound of right great toe, initial encounter    Rx / DC Orders ED Discharge Orders          Ordered    sulfamethoxazole-trimethoprim (BACTRIM DS) 800-160 MG tablet  2 times daily  11/02/21 0358              Xaria Judon, Eugene Gavia, PA-C 11/02/21 0758    Sloan Leiter, DO 11/06/21 [phone]

## 2021-11-14 ENCOUNTER — Ambulatory Visit (HOSPITAL_COMMUNITY)
Admission: EM | Admit: 2021-11-14 | Discharge: 2021-11-14 | Disposition: A | Discharge status: Home / Self Care | Payer: Medicare Other | Attending: Registered Nurse | Admitting: Registered Nurse

## 2021-11-14 ENCOUNTER — Encounter (HOSPITAL_COMMUNITY): Payer: Self-pay | Admitting: Registered Nurse

## 2021-11-14 DIAGNOSIS — Z79899 Other long term (current) drug therapy: Secondary | ICD-10-CM | POA: Insufficient documentation

## 2021-11-14 DIAGNOSIS — Z765 Malingerer [conscious simulation]: Secondary | ICD-10-CM | POA: Diagnosis not present

## 2021-11-14 DIAGNOSIS — Z760 Encounter for issue of repeat prescription: Secondary | ICD-10-CM | POA: Insufficient documentation

## 2021-11-14 DIAGNOSIS — Z5900 Homelessness unspecified: Secondary | ICD-10-CM

## 2021-11-14 DIAGNOSIS — F201 Disorganized schizophrenia: Secondary | ICD-10-CM | POA: Diagnosis not present

## 2021-11-14 MED ORDER — RISPERIDONE 2 MG PO TABS: 2.0000 mg | ORAL_TABLET | Freq: Every day | ORAL | 0 refills | Status: DC

## 2021-11-14 NOTE — BH Assessment (Deleted)
AVS updated with resources  Nancee Liter [facility]

## 2021-11-14 NOTE — BH Assessment (Signed)
Pt left without his AVS including his rx for Risperdal. Clinician returned the rx to the prescribing NP and shredded the AVS.   Alejandro Baker. Jimmye Norman, MS, [facility], [facility] Triage Specialist [facility]

## 2021-11-14 NOTE — ED Provider Notes (Signed)
Behavioral Health Urgent Care Medical Screening Exam  Patient Name: Alejandro Baker MRN: [id number] Date of Evaluation: 11/14/21 Chief Complaint:   Diagnosis:  Final diagnoses:  Disorganized schizophrenia (HCC)  Homelessness  Malingering    History of Present illness: [name] is a 46 y.o. male. patient presented to [facility] as a walk in with complaint that he needs to get back on his medication and asking for medication refills  [name], 46 y.o., male patient seen face to face by this provider, consulted with Dr. Nelly Rout; and chart reviewed on 11/14/21.  On evaluation [name] reports he has been off his medications for a while and wants to get put back on them. Reports he has and diagnosis of schizophrenia and bipolar. Reports he was taking rest pedal that was prescribed by a Dr. [name]. States he can't remember the name of facility where the medication was prescribed. Thinks it may have been [facility].  Patient reports that he is currently homeless and has no support. Request that he does go to [facility]. Patient denies suicidal/self-harm/homicidal ideation, psychosis, and paranoia.   States he just wants to get back on his medications.  During evaluation [name] is sitting up in chair with his feet propped up on table and legs crossed at ankles.  There is no noted distress.  He is alert/oriented x 4; calm dn cooperative.  He is able to give the correct answers to current place, city, state, country, current year.  He also gave the correct answer for his COB but when asked his age he stated "Why do you need to know my date of birth and age? Informed I was checking his orientation but he still would not give me his age.  When asked about current month he stated "August right"  When corrected that it was July he then stated "It's 11/14/21 right."  He was unable to give the name of the president but states he doesn't keep up with "that stuff" His mood is euthymic but with flat  affect.  He is speaking in a clear tone at moderate volume, and normal pace at time he will appear to have pressured speech and will ramble about irrelevantly information but mostly he was attentive and responded appropriately.   His thought process is coherent and mostly relevant; There is no indication that he is currently responding to internal/external stimuli or experiencing delusional thought content.  He  denies suicidal/self-harm/homicidal ideation, psychosis, and paranoia.   Patient has remained calm throughout assessment and has answered questions appropriately.    Patient has presented several times before with similar complaint and has also presented to other ED with complaints of pain or other medical complaints and then just wanting to be left alone so he can sleep.      Psychiatric Specialty Exam  Presentation  General Appearance:Appropriate for Environment; Other (comment) (Odorous)  Eye Contact:Fair  Speech:Normal Rate (At times seems more pressured)  Speech Volume:Normal  Handedness:Right   Mood and Affect  Mood:Euthymic  Affect:Flat   Thought Process  Thought Processes:Coherent  Descriptions of Associations:Intact  Orientation:Full (Time, Place and Person)  Thought Content:Logical  Diagnosis of Schizophrenia or Schizoaffective disorder in past: No data recorded Duration of Psychotic Symptoms: No data recorded Hallucinations:None  Ideas of Reference:None  Suicidal Thoughts:No  Homicidal Thoughts:No   Sensorium  Memory:Remote Fair; Recent Fair; Immediate Fair  Judgment:Fair  Insight:Fair; Present   Executive Functions  Concentration:Good  Attention Span:Good  Recall:Fair  Fund of Knowledge:Good  Language:Good  Psychomotor Activity  Psychomotor Activity:Normal   Assets  Assets:Communication Skills; Desire for Improvement   Sleep  Sleep:Fair  Number of hours: No data recorded  Nutritional Assessment (For OBS and FBC  admissions only) Has the patient had a weight loss or gain of 10 pounds or more in the last 3 months?: No Has the patient had a decrease in food intake/or appetite?: No Does the patient have dental problems?: No Does the patient have eating habits or behaviors that may be indicators of an eating disorder including binging or inducing vomiting?: No Has the patient recently lost weight without trying?: 0 Has the patient been eating poorly because of a decreased appetite?: 0 Malnutrition Screening Tool Score: 0    Physical Exam: Physical Exam Vitals and nursing note reviewed. Exam conducted with a chaperone present.  Constitutional:      General: He is not in acute distress.    Appearance: Normal appearance. He is not ill-appearing.  Cardiovascular:     Rate and Rhythm: Normal rate.  Pulmonary:     Effort: Pulmonary effort is normal.  Musculoskeletal:        General: Normal range of motion.     Cervical back: Normal range of motion.  Skin:    General: Skin is warm and dry.  Neurological:     Mental Status: He is alert and oriented to person, place, and time.  Psychiatric:        Attention and Perception: Attention and perception normal. He does not perceive auditory or visual hallucinations.        Mood and Affect: Mood normal. Affect is flat.        Speech: Speech normal.        Behavior: Behavior normal. Behavior is cooperative.        Thought Content: Thought content is not delusional. Thought content does not include homicidal or suicidal ideation.        Judgment: Judgment is impulsive.    Review of Systems  Constitutional: Negative.   HENT: Negative.    Eyes: Negative.   Respiratory: Negative.    Cardiovascular: Negative.   Gastrointestinal: Negative.   Genitourinary: Negative.   Musculoskeletal: Negative.   Skin: Negative.   Neurological: Negative.   Endo/Heme/Allergies: Negative.   Psychiatric/Behavioral:  Depression: Denies. Hallucinations: Denies. Substance  abuse: Denies. Suicidal ideas: Denies. Nervous/anxious: Denies. Insomnia: Fair.        States he has been off of his psychotropic medications for a year and want to get back on them.  States he doesn't currently have outpatient psychiatric services.     There were no vitals taken for this visit. There is no height or weight on file to calculate BMI.  Musculoskeletal: Strength & Muscle Tone: within normal limits Gait & Station: normal Patient leans: N/A   [facility] MSE Discharge Disposition for Follow up and Recommendations: Based on my evaluation the patient does not appear to have an emergency medical condition and can be discharged with resources and follow up care in outpatient services for Medication Management and Individual Therapy     Discharge Instructions      You need to follow up with a primary care provider for you medical medications.  [facility]  You were given 15 day supply of your mental health medications.  You need to establish a primary psychiatric provider to continue medications management.  Below are resources for psychiatric outpatient services and community services.     Outpatient psychiatric Services  Walk in  hours for medication management Monday, Wednesday, Thursday, and Friday from 8:00 AM to 11:00 AM Recommend arriving by by 7:30 AM.  It is first come first serve.    Walk in hours for therapy intake Monday and Wednesday only 8:00 AM to 11:00 AM Encouraged to arrive by 7:30 AM.  It is first come first serve   Inpatient patient psychiatric services The Facility Based Crisis Unit offers comprehensive behavioral heath care services for mental health and substance abuse treatment.  Social work can also assist with referral to or getting you into a rehabilitation program short or long term       [facility] Address: [address] [place], [place], [place] [address] Phone: [phone]  Supported Employment The supported employment program is a  person-centered, individualized, evidence-based support service that helps members choose, acquire, and maintain competitive employment in our community. This service supports the varying needs of individuals and promotes community inclusion and employment success. Members enrolled in the supported employment program can expect the following:  Development of an individual career plan Community based job placement Job shadowing Job development On-site job Furniture conservator/restorer and support  Supported Education Supported education helps our members receive the education and training they need to achieve their learning and recovery goals. This will assist members with becoming gainfully employed in the job or career of their choice. The program includes assistance with: Registering for disability accommodations Enrolling in school and registering for classes Learning communication skills Scheduling tutoring sessions within your school [facility] partners with Vocational Rehabilitation to help increase the success of clients seeking employment and educational goals.  Want to learn more about our programs?   Please contact our intake department INTAKE: [phone] Ext [phone]  Mailing: [address] [address]   [place], [place] [address]   [email or web address]        Substance Abuse Treatment Programs  Intensive Outpatient Programs [facility]     [address]      [place], [place]                   [phone]       [facility] [address] [place], [place] [phone]  [facility]     (Inpatient and outpatient)     [address]           [phone]    [facility] [phone] (Suboxone and Methadone)  [address]      [place], [place] [address]      [phone]       [address] [place], [place] [phone]  [facility] (Outpatient/Inpatient,  Chemical)    (insurance only) [phone]             [facility] (Groups & Residential) [place], [place] [phone]     [facility]     [address]     [place], [place]      [phone]       [facility] (for caregivers and family) [phone] [address] [place], [place] [phone]      Residential Treatment Programs [facility]      [address], [place], [place] [address]  [phone]       [facility] [address], [place], [place] [address] [phone]  [facility]        [phone]       [facility] [phone]  [facility] ([facility]             [address]                                         [place], [place]                                                [phone] or [phone]                               [facility] [address] [place], [address] [phone]  [facility]    [address]      [place], [place]     [phone]       [facility] [address] [place], [place] [phone]  [facility]   [address] [place], [place] [address]     [phone]      Admissions: 8am-3pm M-F  [facility] (RTS) [address] [place], [place] [phone]  [facility] Program: Residential Program [phone] Days)   [place], [place]      [phone] or [phone]     ADATC: [facility] [place], [place] (Walk in Hours over the weekend or by referral)  [facility] [address] [place], [place], [place] [address] [phone]  Crisis Mobile: [facility]:  [phone] (for crisis response 24 hours a day) [facility] Hotline:      831-535-1037 Outpatient Psychiatry and Counseling  [facility]: Mobile Crisis Management 24 hours:  [phone]  [facility] [employer] sliding scale fee and walk in  schedule: M-F 8am-12pm/1pm-3pm [address]  [place], [place] [address] [phone]  [facility] [address] [place], [place] [address] [phone]  [facility] (Formerly [facility] as [facility] [employer])- new patient walk-in appointments available Monday - Friday 8am -3pm.          [address] [place], [place] [address] [phone] or crisis line- [phone]  [facility] Outpatient Services/ Intensive Outpatient Therapy Program [address] [place], [place] [address] [phone]  [facility]                  Crisis Services      [phone] [address]     [place], [place] [address]                 [facility]   [facility] [phone]. [address] [place], [place] [address]   [facility]          [address]  [place], [place] [address]       [phone]  [facility] [address] [place], [place] [address] [phone]  [facility]    [address]    [place], [place] [address]     [phone]       Andee Poles, MD     [facility]     [place] [address]  [phone]       [facility] [address] [place] [phone]  [facility]     [address]     [place], [place]      [phone]       [facility] Eulogio DitchShamsher Ahluwalia, MD [address] [place], [place] [address] [phone]  [facility]     [address]     [place], [place] [address]     [phone]       [facility] [address] [place], [place] [address] [phone] Resources for Temporary Residential Assistance/Crisis Centers  [facility]) M-F 8am-3pm   [address] [place], [place] [address]   [phone] Services include: laundry,  barbering, support groups, case management, phone  & computer access, showers, AA/NA mtgs, mental health/substance abuse nurse, job skills class, disability information, VA assistance, spiritual classes, etc.   HOMELESS SHELTERS  [facility]     [facility]   [address], [place]     [phone]              [facility] (women and children)       [address] [place], [place] [address] [phone] [email or web address] for application and process Application Required  [facility]   [address]    [place] [address]     [phone]                    [facility] [address] [place], [place] [address] [phone](schedule application appt.) Application Required  [facility] (women only)    [address]     [place], [place] [address]     [phone]      Intake starts 6pm daily Need valid ID, SSC, & Police report [facility] [place] [address] [place], [place] [phone] Application Required  [facility] (men only)     [address]      [place], [place]     [phone]       [facility] (Pregnant women only) [address] [place], [place] [phone]  [facility]      [address]      [place], [place] [address]     [phone]             [facility] [address] [place], [place] [phone] 90 day commitment/SA/Application process  [facility](men only)     [address]     [place], [place]     [phone]       Check-in at [facility]pm            Crisis [facility] [address] [place], [place] [address] [phone] Men/Women/Women and Children must be there by 7 pm  [facility] [place], [place] [phone]                        Assunta FoundShuvon Gari Trovato, NP 11/14/2021, 5:34 PM

## 2021-11-14 NOTE — Progress Notes (Signed)
   11/14/21 1550  [facility] Triage Screening (Walk-ins at [facility] only)  How Did You Hear About [place]? Self  What Is the Reason for Your Visit/Call Today? Pt is a 46 yo male who presented voluntarily and unaccompanied. Pt denied SI, HI, NSSH, AVH, paranoia and current substance use.  Pt reported occasional use of alcohol when he can afford it. Pt stated he wants his medication. Pt is asking for medications for psychiatric dxs (schizophrenia) and medical dxs (Metformin for Diabetes.) Pt was alert and not completely oriented. He was not able to answer orientation type questions accurately but is not completely disoriented. Pt was displaying a flight of ideas and at times, verbalizing in a flight of ideas non-sensically.  How Long Has This Been Causing You Problems? > than 6 months  Have You Recently Had Any Thoughts About Hurting Yourself? No  Are You Planning to Commit Suicide/Harm Yourself At This time? No  Have you Recently Had Thoughts About Hurting Someone [name]? No  Are You Planning To Harm Someone At This Time? No  Are you currently experiencing any auditory, visual or other hallucinations? No  Have You Used Any Alcohol or Drugs in the Past 24 Hours? No  Do you have any current medical co-morbidities that require immediate attention? Yes  Please describe current medical co-morbidities that require immediate attention: Diabetes  Clinician description of patient physical appearance/behavior: Pt was alert and seemed mostly oriented but at times seemed lost in a flight of ideas and tangential talking. Pt shuffled as he walked and spoke slowly. Pt appeared to not be well groomed as he was malodorous. It was not possible to determine pt's level of judgment and insight. Both seemed to be significantly impaired.  If access to [facility] was not available, would you have sought care in the Emergency Department? Yes  Determination of Need Routine (7 days)   Corrie Dandy T. Jimmye Norman, MS, [facility], [facility] Triage Specialist [facility]

## 2021-11-14 NOTE — Progress Notes (Signed)
AVS updated for outpatient resources and medication management.  Nancee Liter [facility]

## 2021-11-14 NOTE — Discharge Instructions (Addendum)
You need to follow up with a primary care provider for you medical medications.  [place] [facility]  You were given 15 day supply of your mental health medications.  You need to establish a primary psychiatric provider to continue medications management.  Below are resources for psychiatric outpatient services and community services.     Outpatient psychiatric Services  Walk in hours for medication management Monday, Wednesday, Thursday, and Friday from 8:00 AM to 11:00 AM Recommend arriving by by 7:30 AM.  It is first come first serve.    Walk in hours for therapy intake Monday and Wednesday only 8:00 AM to 11:00 AM Encouraged to arrive by 7:30 AM.  It is first come first serve   Inpatient patient psychiatric services The [facility] offers comprehensive behavioral heath care services for mental health and substance abuse treatment.  Social work can also assist with referral to or getting you into a rehabilitation program short or long term       [facility] Address: [address] [place], [place], [place] [address] Phone: [phone]  Supported Employment The supported employment program is a person-centered, individualized, evidence-based support service that helps members choose, acquire, and maintain competitive employment in our community. This service supports the varying needs of individuals and promotes community inclusion and employment success. Members enrolled in the supported employment program can expect the following:  Development of an individual career plan Community based job placement Job shadowing Job development On-site job Furniture conservator/restorer and support  Supported Education Supported education helps our members receive the education and training they need to achieve their learning and recovery goals. This will assist members with becoming gainfully employed in the job or career of their choice. The program includes assistance  with: Registering for disability accommodations Enrolling in school and registering for classes Learning communication skills Scheduling tutoring sessions within your school [facility] partners with Vocational Rehabilitation to help increase the success of clients seeking employment and educational goals.  Want to learn more about our programs?   Please contact our intake department INTAKE: [phone] Ext [phone]  Mailing: [address] [address]   [place], [place] [address]   [email or web address]        Substance Abuse Treatment Programs  Intensive Outpatient Programs [facility]     [address]      [place], [place]                   [phone]       [facility] [address] [place], [place] [phone]  [facility]     (Inpatient and outpatient)     [address]           [phone]    [facility] [phone] (Suboxone and Methadone)  [address]      [place], [place] [address]      [phone]       [address] [place], [place] [phone]  [facility] (Outpatient/Inpatient, [employer])    (insurance only) [phone]             [facility] (Groups & Residential) [place], [place] [phone]     [facility]     [address]     [place], [place]      [phone]       [facility] (for caregivers and family) [phone] [address] [place], [place]  [phone]      Residential Treatment Programs [facility]      [address] [place], [place] [address]  [phone]       [facility] [address], [place], [place] [address] [phone]  [facility] [place]        [phone]       [facility] [phone]  [facility] ([facility]             [address]                                         [place], [place]                                                [phone] or  [phone]                               [facility] [place] [address] [place], [address] [phone]  [facility]    [address]      [place], [place]     [phone]       [facility] [address] [place], [place] [phone]  [facility]   [address] [place], [place] [address]     [phone]      Admissions: 8am-3pm M-F  [facility] (RTS) [address] [place], [place] [phone]  [facility]: Residential Program [phone] Days)   [place], [place]      [phone] or [phone]     [facility]: [facility] [place], [place] (Walk in Hours over the weekend or by referral)  [facility] [address] [place], [place], [place] [address] [phone]  Crisis Mobile: [facility]:  [phone] (for crisis response 24 hours a day) [facility] Hotline:      (570)085-5225 Outpatient Psychiatry and Counseling  [facility]: Mobile Crisis Management 24 hours:  [phone]  [facility] of the [employer] sliding scale fee and walk in schedule: M-F 8am-12pm/1pm-3pm [address]  [place], [place] [address] [phone]  [facility], [facility], [facility] [address] [place], [place] [address] [phone]  [facility] (Formerly known as [facility] [employer])- new patient walk-in appointments available Monday - Friday 8am -3pm.          [address] [place], [place] [address] [phone] or crisis line- [phone]  [facility] Outpatient Services/ Intensive Outpatient Therapy Program [address] [place], [place] [address] [phone]  [facility]                  Crisis Services      [phone] [address]     [place], [place] [address]                 [facility]   [facility] [phone]. [address] [place], [place] [address]   [employer] [facility]          [address]  [place], [place] [address]       [phone]  [facility] [address] [place], [place] [address] [phone]  [facility]    [address]    [place], [place] [address]     [phone]       Andee Poles, MD     [address]     [place] [address]     [phone]       [facility] [address] [place] [address]  [facility]     [address] [place], [place]      [phone]       [facility] Eulogio Ditch, MD [address] [place], [place] [address] [phone]  [facility]     [address]     [place], [place] [address]     [phone]       [facility] [address] [place], [place] [address] [phone] Resources for Temporary Residential Assistance/Crisis Centers  [facility]) M-F 8am-3pm   [address] [place], [place] [address]   [phone] Services include: laundry, barbering, support groups, case management, phone  & computer access, showers, AA/NA mtgs, mental health/substance abuse nurse, job skills class, disability information, VA assistance, spiritual classes, etc.   HOMELESS SHELTERS  [facility]     [facility]   [address] [place]     [phone]              [employer] (women and children)       [address] [place], [place] [address] [phone] [email or web address] for application and process Application Required  [facility] [employer] [facility]   [address]    [place] [address]     [phone]                    [facility] [place] [address] [place], [place] [address] [phone] application appt.) Application  Required  [facility] (women only)    [address]     [place], [place] [address]     [phone]      Intake starts 6pm daily Need valid ID, SSC, & Police report [employer] [address] [place], [place] [phone] Application Required  [employer] (men only)     [address]      [place], [place]     [phone]       [facility] [place] (Pregnant women only) [address] [place], [place] [phone]  [facility]      [address]      [place], [place] [address]     [phone]             [facility] [place] [facility] [address] [place], [place] [phone] 90 day commitment/SA/Application process  [facility](men only)     [address]     [place], [place]     [phone]       Check-in at [facility] of [facility] [address] [place], [place] [address] [phone] Men/Women/Women and Children must be there by 7 pm  [facility] [place], [place] [phone]

## 2021-11-23 ENCOUNTER — Emergency Department (HOSPITAL_COMMUNITY)
Admission: EM | Admit: 2021-11-23 | Discharge: 2021-11-23 | Discharge status: Against Medical Advice | Payer: Medicare Other | Attending: Emergency Medicine | Admitting: Emergency Medicine

## 2021-11-23 DIAGNOSIS — Z5321 Procedure and treatment not carried out due to patient leaving prior to being seen by health care provider: Secondary | ICD-10-CM | POA: Diagnosis not present

## 2021-11-23 DIAGNOSIS — R202 Paresthesia of skin: Secondary | ICD-10-CM | POA: Diagnosis present

## 2021-11-23 NOTE — ED Triage Notes (Signed)
Pt via [facility] for eval of "heat-stroke, just hot" Pt originally called out for stroke-like symtoms, negative stroke screen. Pt A&O, at baseline  158/90 HR 108 CBG 378

## 2021-11-23 NOTE — ED Provider Triage Note (Signed)
Emergency Medicine Provider Triage Evaluation Note  Alejandro Baker , a 46 y.o. male  was evaluated in triage.  Pt complains of going sensation to both of his legs.  Reports he was out in the heat all day.  Is requesting ice chips and water in triage..  Review of Systems  Positive: Leg tingling Negative: fever  Physical Exam  BP 123/86   Pulse (!) 104   Temp 98.1 F (36.7 C) (Oral)   Resp 18   SpO2 95%  Gen:   Awake, no distress   Resp:  Normal effort  MSK:   Moves extremities without difficulty  Other:    Medical Decision Making  Medically screening exam initiated at 5:28 PM.  Appropriate orders placed.  Alejandro Baker was informed that the remainder of the evaluation will be completed by another provider, this initial triage assessment does not replace that evaluation, and the importance of remaining in the ED until their evaluation is complete.     Claude Manges, PA-C 11/23/21 1730

## 2021-11-23 NOTE — ED Notes (Signed)
Patient not responding.

## 2021-11-27 ENCOUNTER — Emergency Department (HOSPITAL_COMMUNITY)
Admission: EM | Admit: 2021-11-27 | Discharge: 2021-11-27 | Discharge status: Against Medical Advice | Payer: Medicare Other

## 2021-11-27 NOTE — ED Notes (Signed)
Patient seen leaving ED before EMS could give report.  Patient not found in lobby.

## 2021-11-28 ENCOUNTER — Encounter (HOSPITAL_COMMUNITY): Payer: Self-pay | Admitting: Emergency Medicine

## 2021-11-28 ENCOUNTER — Emergency Department (HOSPITAL_COMMUNITY)
Admission: EM | Admit: 2021-11-28 | Discharge: 2021-11-28 | Disposition: A | Discharge status: Home / Self Care | Payer: PPO | Attending: Emergency Medicine | Admitting: Emergency Medicine

## 2021-11-28 ENCOUNTER — Other Ambulatory Visit: Payer: Self-pay

## 2021-11-28 DIAGNOSIS — Z7984 Long term (current) use of oral hypoglycemic drugs: Secondary | ICD-10-CM | POA: Diagnosis not present

## 2021-11-28 DIAGNOSIS — E119 Type 2 diabetes mellitus without complications: Secondary | ICD-10-CM | POA: Diagnosis not present

## 2021-11-28 DIAGNOSIS — M79672 Pain in left foot: Secondary | ICD-10-CM

## 2021-11-28 DIAGNOSIS — M79671 Pain in right foot: Secondary | ICD-10-CM | POA: Diagnosis not present

## 2021-11-28 DIAGNOSIS — M25571 Pain in right ankle and joints of right foot: Secondary | ICD-10-CM | POA: Diagnosis not present

## 2021-11-28 NOTE — ED Notes (Signed)
Pt provided w/ clean socks. Pt educated on how to keep feet clean & dry

## 2021-11-28 NOTE — ED Triage Notes (Signed)
Pt reports bilateral foot pain. Reports he thinks he has had gravel in his shoes and that its making his feet sore.

## 2021-11-28 NOTE — ED Provider Notes (Signed)
[facility]-EMERGENCY DEPT Provider Note   CSN: [id number] Arrival date & time: 11/28/21  2542     History  Chief Complaint  Patient presents with   Foot Pain    Alejandro Baker is a 46 y.o. male.  HPI Alejandro Baker is a 46 yo male with a history of DM and schizoaffective disorder who presents to the ED with bilateral feet pain, predominately his right foot, though possibly the left foot, x "few weeks." He reports he always notices the pain but reports it is worse when walking. He rates the pain as 6-7/10 on the pain scale. He reports it feels like "sand" along his feet. He reports the pain predominately on his lateral aspects on his feet/ankles. He denies any known inciting event.  When asked for further details the patient offers a tangential history, but seemingly denies trauma, fall, obvious precipitant.    Home Medications Prior to Admission medications   Medication Sig Start Date End Date Taking? Authorizing Provider  albuterol (VENTOLIN HFA) 108 (90 Base) MCG/ACT inhaler Inhale 1-2 puffs into the lungs every 6 (six) hours as needed for wheezing or shortness of breath. 05/19/21   Prosperi, Christian H, PA-C  fluticasone (FLONASE) 50 MCG/ACT nasal spray Place 1 spray into both nostrils daily. 05/19/21   Prosperi, Christian H, PA-C  metFORMIN (GLUCOPHAGE) 500 MG tablet Take 1 tablet (500 mg total) by mouth 2 (two) times daily with a meal. 06/02/21   [name], MD  risperiDONE (RISPERDAL) 2 MG tablet Take 1 tablet (2 mg total) by mouth at bedtime. 11/14/21 12/14/21  Rankin, Shuvon B, NP      Allergies    Penicillins and Povidone-iodine    Review of Systems   Review of Systems  All other systems reviewed and are negative.   Physical Exam Updated Vital Signs BP (!) 143/105 (BP Location: Left Arm)   Pulse 72   Temp 97.7 F (36.5 C) (Oral)   Resp 18   SpO2 100%  Physical Exam Vitals and nursing note reviewed.  Constitutional:      General: He is not  in acute distress.    Appearance: He is well-developed.  HENT:     Head: Normocephalic and atraumatic.  Eyes:     Conjunctiva/sclera: Conjunctivae normal.  Cardiovascular:     Rate and Rhythm: Normal rate and regular rhythm.  Pulmonary:     Effort: Pulmonary effort is normal. No respiratory distress.     Breath sounds: No stridor.  Abdominal:     General: There is no distension.  Musculoskeletal:     Comments: No deformity noted of the ankles bilaterally, left foot on expection patient is moving freely, but is no deformity, open wounds.  Skin is erythematous on the plantar surface.  Skin:    General: Skin is warm and dry.  Neurological:     General: No focal deficit present.     Mental Status: He is alert and oriented to person, place, and time.  Psychiatric:        Behavior: Behavior is cooperative.        Cognition and Memory: Cognition is impaired.     Comments: Patient follows direct answers to specific questions with tangential elaboration     ED Results / Procedures / Treatments   Labs (all labs ordered are listed, but only abnormal results are displayed) Labs Reviewed - No data to display  EKG None  Radiology No results found.  Procedures Procedures    Medications  Ordered in ED Medications - No data to display  ED Course/ Medical Decision Making/ A&P This patient with a Hx of schizoaffective disorder, diabetes presents to the ED for concern of foot pain, this involves an extensive number of treatment options, and is a complaint that carries with it a high risk of complications and morbidity.    The differential diagnosis includes homelessness, cellulitis, open wound, less likely fracture given the absence of trauma   Social Determinants of Health:  Schizoaffective disorder, homelessness  Additional history obtained:  Additional history and/or information obtained from chart review, notable for 13 visits in the past 6 months and EMS arrival yesterday,  though the patient left without being seen   After the initial evaluation, orders, including: Socks provided to the patient   P Dispostion / Final MDM:  After consideration of the diagnostic results and the patient's response to treatment, adult male with schizoaffective disorder, homelessness presents with foot pain.  Patient is awake, alert, ambulatory has no deformities on exam is moving his toes, does have some erythematous changes consistent with overuse and possibly poor hygiene, but no evidence for cellulitis, bacteremia, sepsis, patient appropriate for outpatient follow-up with behavioral health and primary care as an outpatient.   Final Clinical Impression(s) / ED Diagnoses Final diagnoses:  Foot pain, left    Rx / DC Orders ED Discharge Orders     None         Gerhard Munch, MD 11/28/21 1007

## 2021-11-28 NOTE — Discharge Instructions (Signed)
As discussed, today's evaluation has been generally reassuring.  Please focus on keeping your feet dry, and clean.  Please use the provided information for follow-up with both a medicine clinic and our behavioral health colleagues.  Return here for concerning changes in your condition.

## 2022-01-20 ENCOUNTER — Encounter (HOSPITAL_COMMUNITY): Payer: Self-pay | Admitting: Emergency Medicine

## 2022-01-20 ENCOUNTER — Emergency Department (HOSPITAL_COMMUNITY)
Admission: EM | Admit: 2022-01-20 | Discharge: 2022-01-20 | Discharge status: Against Medical Advice | Payer: PPO | Attending: Emergency Medicine | Admitting: Emergency Medicine

## 2022-01-20 ENCOUNTER — Emergency Department (HOSPITAL_COMMUNITY): Payer: PPO

## 2022-01-20 ENCOUNTER — Other Ambulatory Visit: Payer: Self-pay

## 2022-01-20 DIAGNOSIS — R58 Hemorrhage, not elsewhere classified: Secondary | ICD-10-CM | POA: Diagnosis not present

## 2022-01-20 DIAGNOSIS — Z7984 Long term (current) use of oral hypoglycemic drugs: Secondary | ICD-10-CM | POA: Insufficient documentation

## 2022-01-20 DIAGNOSIS — R739 Hyperglycemia, unspecified: Secondary | ICD-10-CM | POA: Diagnosis not present

## 2022-01-20 DIAGNOSIS — Z5321 Procedure and treatment not carried out due to patient leaving prior to being seen by health care provider: Secondary | ICD-10-CM | POA: Insufficient documentation

## 2022-01-20 DIAGNOSIS — E119 Type 2 diabetes mellitus without complications: Secondary | ICD-10-CM | POA: Diagnosis not present

## 2022-01-20 DIAGNOSIS — M79673 Pain in unspecified foot: Secondary | ICD-10-CM | POA: Diagnosis not present

## 2022-01-20 DIAGNOSIS — M25571 Pain in right ankle and joints of right foot: Secondary | ICD-10-CM | POA: Insufficient documentation

## 2022-01-20 NOTE — ED Triage Notes (Signed)
Patient arrived with PTAR from street reports right foot pain for several days . Ambulatory .

## 2022-01-20 NOTE — ED Notes (Addendum)
Pt walking down the hallway yelling "how do I get out of here?!!"  Pt refused to stay for xray results and to speak with MD. Pt left- notified PA

## 2022-01-20 NOTE — ED Provider Notes (Signed)
[facility] EMERGENCY DEPARTMENT Provider Note   CSN: [id number] Arrival date & time: 01/20/22  6734     History  Chief Complaint  Patient presents with   Ankle Pain    Alejandro Baker is a 46 y.o. male.  Patient presents to the hospital via EMS complaining of right-sided ankle pain which has been ongoing for several days.  Alejandro Baker denies any recent trauma to the area.  Denies any falls.  Patient with past medical history significant for DM without complication, schizoaffective disorder, bipolar type, hypertension, neuropathy, polysubstance abuse, malingering, homelessness  HPI     Home Medications Prior to Admission medications   Medication Sig Start Date End Date Taking? Authorizing Provider  albuterol (VENTOLIN HFA) 108 (90 Base) MCG/ACT inhaler Inhale 1-2 puffs into the lungs every 6 (six) hours as needed for wheezing or shortness of breath. 05/19/21   Prosperi, Christian H, PA-C  fluticasone (FLONASE) 50 MCG/ACT nasal spray Place 1 spray into both nostrils daily. 05/19/21   Prosperi, Christian H, PA-C  metFORMIN (GLUCOPHAGE) 500 MG tablet Take 1 tablet (500 mg total) by mouth 2 (two) times daily with a meal. 06/02/21   Zachery Dakins, MD  risperiDONE (RISPERDAL) 2 MG tablet Take 1 tablet (2 mg total) by mouth at bedtime. 11/14/21 12/14/21  Rankin, Shuvon B, NP      Allergies    Penicillins and Povidone-iodine    Review of Systems   Review of Systems  Musculoskeletal:  Positive for arthralgias.    Physical Exam Updated Vital Signs BP 123/80   Pulse 80   Temp 97.7 F (36.5 C)   Resp 16   SpO2 97%  Physical Exam Vitals and nursing note reviewed.  Constitutional:      General: Alejandro Baker is not in acute distress. HENT:     Head: Normocephalic and atraumatic.  Eyes:     Conjunctiva/sclera: Conjunctivae normal.  Cardiovascular:     Rate and Rhythm: Normal rate.  Pulmonary:     Effort: Pulmonary effort is normal. No respiratory distress.  Abdominal:      General: Abdomen is flat.  Musculoskeletal:        General: No tenderness or signs of injury.     Cervical back: Normal range of motion.     Comments: No tenderness to palpation of the right ankle.  Patient does complain of pain when taking off his shoe to the right ankle area.  No obvious injury or deformity noted.  No erythema or signs of cellulitis  Skin:    General: Skin is dry.  Neurological:     Mental Status: Alejandro Baker is alert.  Psychiatric:        Speech: Speech normal.        Behavior: Behavior normal.     ED Results / Procedures / Treatments   Labs (all labs ordered are listed, but only abnormal results are displayed) Labs Reviewed - No data to display  EKG None  Radiology No results found.  Procedures Procedures    Medications Ordered in ED Medications - No data to display  ED Course/ Medical Decision Making/ A&P                           Medical Decision Making  Patient presents with complaint of right ankle pain.  Differential includes but is not limited to fracture, dislocation, sprain, soft tissue injury, cellulitis, others  I reviewed the patient's past medical history and see multiple  visits for foot pain including a visit on August 1 to the emergency department complaining of left foot pain, a visit in early July for a right great toe injury.  I see no indication for lab work at this time.  The patient is not septic  I ordered films of the right ankle.  The patient left prior to the images being uploaded to the system.  The patient's nurse try to convince the patient to stay for results and the patient stated that Alejandro Baker was leaving.  Patient eloped        Final Clinical Impression(s) / ED Diagnoses Final diagnoses:  None    Rx / DC Orders ED Discharge Orders     None         Pamala Duffel 01/20/22 1024    Gwyneth Sprout, MD 01/24/22 1313

## 2022-01-20 NOTE — ED Notes (Signed)
Pt would not wake up to talk with this RN. Pt squeezing eyes shut and ignoring RN. Appears to be in no apparent distress. Turned on his side, breathing WNL

## 2022-01-25 ENCOUNTER — Emergency Department (HOSPITAL_COMMUNITY)
Admission: EM | Admit: 2022-01-25 | Discharge: 2022-01-25 | Discharge status: Against Medical Advice | Payer: PPO | Attending: Emergency Medicine | Admitting: Emergency Medicine

## 2022-01-25 ENCOUNTER — Encounter (HOSPITAL_COMMUNITY): Payer: Self-pay | Admitting: Emergency Medicine

## 2022-01-25 ENCOUNTER — Other Ambulatory Visit: Payer: Self-pay

## 2022-01-25 DIAGNOSIS — R040 Epistaxis: Secondary | ICD-10-CM | POA: Insufficient documentation

## 2022-01-25 DIAGNOSIS — I959 Hypotension, unspecified: Secondary | ICD-10-CM | POA: Diagnosis not present

## 2022-01-25 DIAGNOSIS — Z5321 Procedure and treatment not carried out due to patient leaving prior to being seen by health care provider: Secondary | ICD-10-CM | POA: Insufficient documentation

## 2022-01-25 DIAGNOSIS — R059 Cough, unspecified: Secondary | ICD-10-CM | POA: Diagnosis not present

## 2022-01-25 DIAGNOSIS — R739 Hyperglycemia, unspecified: Secondary | ICD-10-CM | POA: Diagnosis not present

## 2022-01-25 NOTE — ED Notes (Signed)
I called patient for labs and no one responded °

## 2022-01-25 NOTE — ED Provider Triage Note (Signed)
Emergency Medicine Provider Triage Evaluation Note  Alejandro Baker , a 46 y.o. male  was evaluated in triage.  Pt complains of nosebleed.  He said he had a nosebleed this morning from the left.  The rest of the patient's HPI is illogical.  It does not follow any train of thought.  He started talking about plastic wear and states that he is concerned that there is too much plastic wear and that sometimes plastic fragments might get in his nose and make him have a nosebleed.  Patient denies alcohol abuse.  He is giggling throughout the exam  Review of Systems  Positive: Nosebleed Negative: LOC  Physical Exam  BP 126/88 (BP Location: Right Arm)   Pulse (!) 115   Temp 98.3 F (36.8 C) (Oral)   Resp 18   SpO2 96%  Gen:   Awake, no distress   Resp:  Normal effort  MSK:   Moves extremities without difficulty  Other:  No obvious epistaxis on physical examination, tangential, disorganized speech  Medical Decision Making  Medically screening exam initiated at 1:15 PM.  Appropriate orders placed.  Alejandro Baker was informed that the remainder of the evaluation will be completed by another provider, this initial triage assessment does not replace that evaluation, and the importance of remaining in the ED until their evaluation is complete.  Work up initiated   [employer], PA-C 01/25/22 1319

## 2022-01-25 NOTE — ED Triage Notes (Signed)
Pt arrives w/ [facility] w/ c/o nosebleed & cough. VSS

## 2022-02-17 ENCOUNTER — Emergency Department (HOSPITAL_COMMUNITY): Admission: EM | Admit: 2022-02-17 | Discharge: 2022-02-17 | Discharge status: Against Medical Advice | Payer: PPO

## 2022-02-17 DIAGNOSIS — R739 Hyperglycemia, unspecified: Secondary | ICD-10-CM | POA: Diagnosis not present

## 2022-02-17 DIAGNOSIS — R000 Tachycardia, unspecified: Secondary | ICD-10-CM | POA: Diagnosis not present

## 2022-02-18 ENCOUNTER — Emergency Department (HOSPITAL_COMMUNITY)
Admission: EM | Admit: 2022-02-18 | Discharge: 2022-02-18 | Discharge status: Against Medical Advice | Payer: PPO | Attending: Emergency Medicine | Admitting: Emergency Medicine

## 2022-02-18 ENCOUNTER — Encounter (HOSPITAL_COMMUNITY): Payer: Self-pay | Admitting: Emergency Medicine

## 2022-02-18 DIAGNOSIS — R739 Hyperglycemia, unspecified: Secondary | ICD-10-CM | POA: Diagnosis not present

## 2022-02-18 DIAGNOSIS — Z5321 Procedure and treatment not carried out due to patient leaving prior to being seen by health care provider: Secondary | ICD-10-CM | POA: Insufficient documentation

## 2022-02-18 DIAGNOSIS — M25569 Pain in unspecified knee: Secondary | ICD-10-CM | POA: Diagnosis not present

## 2022-02-18 NOTE — ED Notes (Signed)
Called no answer x3 

## 2022-02-18 NOTE — ED Notes (Signed)
Called for room no answer

## 2022-02-18 NOTE — ED Triage Notes (Signed)
Pt will not talk to RN while in triage

## 2022-02-18 NOTE — ED Triage Notes (Signed)
Pt here from in front of [employer] was told to leave , then called EMS and wanted to come to hospital for knee pain , refused VS with ems

## 2022-02-18 NOTE — ED Notes (Signed)
Called no answer

## 2022-02-19 ENCOUNTER — Telehealth: Payer: Self-pay | Admitting: Licensed Clinical Social Worker

## 2022-02-19 NOTE — Patient Outreach (Signed)
  Care Coordination   02/19/2022 Name: Erica Osuna MRN: [id number] DOB: Nov 19, 1975   Care Coordination Outreach Attempts:  An unsuccessful telephone outreach was attempted today to offer the patient information about available care coordination services as a benefit of their health plan.   Follow Up Plan:  Additional outreach attempts will be made to offer the patient care coordination information and services.   Encounter Outcome:  No Answer  Care Coordination Interventions Activated:  No   Care Coordination Interventions:  No, not indicated    Christa See, [email or web address] Phone [phone] 1:50 PM

## 2022-03-02 ENCOUNTER — Encounter (HOSPITAL_COMMUNITY): Payer: Self-pay | Admitting: Emergency Medicine

## 2022-03-02 ENCOUNTER — Other Ambulatory Visit: Payer: Self-pay

## 2022-03-02 ENCOUNTER — Emergency Department (HOSPITAL_COMMUNITY)
Admission: EM | Admit: 2022-03-02 | Discharge: 2022-03-02 | Disposition: A | Discharge status: Home / Self Care | Payer: PPO | Attending: Emergency Medicine | Admitting: Emergency Medicine

## 2022-03-02 DIAGNOSIS — G8929 Other chronic pain: Secondary | ICD-10-CM | POA: Diagnosis not present

## 2022-03-02 DIAGNOSIS — Z5900 Homelessness unspecified: Secondary | ICD-10-CM | POA: Insufficient documentation

## 2022-03-02 DIAGNOSIS — X31XXXA Exposure to excessive natural cold, initial encounter: Secondary | ICD-10-CM | POA: Diagnosis not present

## 2022-03-02 DIAGNOSIS — M545 Low back pain, unspecified: Secondary | ICD-10-CM | POA: Insufficient documentation

## 2022-03-02 DIAGNOSIS — Z7984 Long term (current) use of oral hypoglycemic drugs: Secondary | ICD-10-CM | POA: Diagnosis not present

## 2022-03-02 MED ORDER — ACETAMINOPHEN 325 MG PO TABS
650.0000 mg | ORAL_TABLET | Freq: Once | ORAL | Status: AC
  Administered 2022-03-02: 650 mg via ORAL
  Filled 2022-03-02: qty 2

## 2022-03-02 NOTE — ED Provider Notes (Signed)
[facility] EMERGENCY DEPARTMENT Provider Note   CSN: [id number] Arrival date & time: 03/02/22  0249     History  Chief Complaint  Patient presents with   Cold Exposure    Alejandro Baker is a 46 y.o. male.  46 year old male with complaint of pain in his tailbone after a fall over a year ago. States that is is cold outside and he can't walk (walked into the ER).        Home Medications Prior to Admission medications   Medication Sig Start Date End Date Taking? Authorizing Provider  albuterol (VENTOLIN HFA) 108 (90 Base) MCG/ACT inhaler Inhale 1-2 puffs into the lungs every 6 (six) hours as needed for wheezing or shortness of breath. 05/19/21   Prosperi, Christian H, PA-C  fluticasone (FLONASE) 50 MCG/ACT nasal spray Place 1 spray into both nostrils daily. 05/19/21   Prosperi, Christian H, PA-C  metFORMIN (GLUCOPHAGE) 500 MG tablet Take 1 tablet (500 mg total) by mouth 2 (two) times daily with a meal. 06/02/21   Zachery Dakins, MD  risperiDONE (RISPERDAL) 2 MG tablet Take 1 tablet (2 mg total) by mouth at bedtime. 11/14/21 12/14/21  Rankin, Shuvon B, NP      Allergies    Penicillins and Povidone-iodine    Review of Systems   Review of Systems Negative except as per HPI Physical Exam Updated Vital Signs BP (!) 154/98 (BP Location: Right Arm)   Pulse 88   Temp 97.9 F (36.6 C) (Oral)   Resp 20   SpO2 97%  Physical Exam Vitals and nursing note reviewed.  Constitutional:      General: He is not in acute distress.    Appearance: He is well-developed. He is not diaphoretic.  HENT:     Head: Normocephalic and atraumatic.  Pulmonary:     Effort: Pulmonary effort is normal.  Musculoskeletal:     Thoracic back: No tenderness or bony tenderness.     Lumbar back: No tenderness or bony tenderness.     Comments: Skin normal, no midline/bony tenderness   Skin:    General: Skin is warm and dry.     Findings: No erythema or rash.  Neurological:     Mental  Status: He is alert and oriented to person, place, and time.     Motor: No weakness.     Gait: Gait normal.  Psychiatric:        Behavior: Behavior normal.     ED Results / Procedures / Treatments   Labs (all labs ordered are listed, but only abnormal results are displayed) Labs Reviewed - No data to display  EKG None  Radiology No results found.  Procedures Procedures    Medications Ordered in ED Medications  acetaminophen (TYLENOL) tablet 650 mg (650 mg Oral Given 03/02/22 0406)    ED Course/ Medical Decision Making/ A&P                           Medical Decision Making Risk OTC drugs.   46 year old male presents with complaint of being cold outside, it is around 30 degrees tonight.  He states that he has pain in his tailbone from a fall about a year ago which prevents him from walking.  Patient is able to ambulate, stands in the room, there are no skin changes, no midline or bony tenderness, no tenderness otherwise.  No other complaints or concerns tonight.  Patient [facility] tonight and provided with shelter list. Provided with Tylenol prior to discharge.        Final Clinical Impression(s) / ED Diagnoses Final diagnoses:  Chronic low back pain without sciatica, unspecified back pain laterality  Homeless    Rx / DC Orders ED Discharge Orders     None         Alden Hipp 03/02/22 0439    Nira Conn, MD 03/02/22 1818

## 2022-03-02 NOTE — ED Triage Notes (Signed)
Patient reports feeling cold this evening .

## 2022-03-02 NOTE — Discharge Instructions (Signed)
You can go to the [facility] for shelter.  [address]

## 2022-03-08 ENCOUNTER — Telehealth: Payer: Self-pay | Admitting: Licensed Clinical Social Worker

## 2022-03-08 NOTE — Patient Outreach (Signed)
  Care Coordination   03/08/2022 Name: Alejandro Baker MRN: [id number] DOB: [birth year]   Care Coordination Outreach Attempts:  A second unsuccessful outreach was attempted today to offer the patient with information about available care coordination services as a benefit of their health plan.     Follow Up Plan:  Additional outreach attempts will be made to offer the patient care coordination information and services.   Encounter Outcome:  No Answer  Care Coordination Interventions Activated:  No   Care Coordination Interventions:  No, not indicated    Jenel Lucks, MSW, LCSW [facility] Care Management [facility]  [facility] [email or web address] Phone [phone] 4:24 PM

## 2022-03-21 ENCOUNTER — Other Ambulatory Visit: Payer: Self-pay

## 2022-03-21 ENCOUNTER — Emergency Department (HOSPITAL_COMMUNITY)
Admission: EM | Admit: 2022-03-21 | Discharge: 2022-03-21 | Disposition: A | Discharge status: Home / Self Care | Payer: PPO | Attending: Emergency Medicine | Admitting: Emergency Medicine

## 2022-03-21 DIAGNOSIS — F10920 Alcohol use, unspecified with intoxication, uncomplicated: Secondary | ICD-10-CM

## 2022-03-21 DIAGNOSIS — R0689 Other abnormalities of breathing: Secondary | ICD-10-CM | POA: Diagnosis not present

## 2022-03-21 DIAGNOSIS — Z7984 Long term (current) use of oral hypoglycemic drugs: Secondary | ICD-10-CM | POA: Insufficient documentation

## 2022-03-21 DIAGNOSIS — R001 Bradycardia, unspecified: Secondary | ICD-10-CM | POA: Diagnosis not present

## 2022-03-21 DIAGNOSIS — I959 Hypotension, unspecified: Secondary | ICD-10-CM | POA: Diagnosis not present

## 2022-03-21 DIAGNOSIS — F10129 Alcohol abuse with intoxication, unspecified: Secondary | ICD-10-CM | POA: Insufficient documentation

## 2022-03-21 DIAGNOSIS — F10929 Alcohol use, unspecified with intoxication, unspecified: Secondary | ICD-10-CM | POA: Diagnosis not present

## 2022-03-21 NOTE — ED Provider Notes (Signed)
[facility]-EMERGENCY DEPT Provider Note   CSN: [id number] Arrival date & time: 03/21/22  0800     History  Chief Complaint  Patient presents with   Alcohol Intoxication    Alejandro Baker is a 46 y.o. male.  Pt complains of  sleeping in a parking deck and getting cold and wet.  Pt reports he lives in [place] but was here drinking with friends.  Pt is wearing wet clothes.    The history is provided by the patient. No language interpreter was used.  Alcohol Intoxication This is a new problem. The problem occurs constantly. Nothing aggravates the symptoms. Nothing relieves the symptoms. He has tried nothing for the symptoms. The treatment provided no relief.       Home Medications Prior to Admission medications   Medication Sig Start Date End Date Taking? Authorizing Provider  albuterol (VENTOLIN HFA) 108 (90 Base) MCG/ACT inhaler Inhale 1-2 puffs into the lungs every 6 (six) hours as needed for wheezing or shortness of breath. 05/19/21   Prosperi, Christian H, PA-C  fluticasone (FLONASE) 50 MCG/ACT nasal spray Place 1 spray into both nostrils daily. 05/19/21   Prosperi, Christian H, PA-C  metFORMIN (GLUCOPHAGE) 500 MG tablet Take 1 tablet (500 mg total) by mouth 2 (two) times daily with a meal. 06/02/21   [name], MD  risperiDONE (RISPERDAL) 2 MG tablet Take 1 tablet (2 mg total) by mouth at bedtime. 11/14/21 12/14/21  Rankin, Shuvon B, NP      Allergies    Penicillins and Povidone-iodine    Review of Systems   Review of Systems  All other systems reviewed and are negative.   Physical Exam Updated Vital Signs BP (!) 144/112 (BP Location: Right Arm)   Pulse 98   Temp 98 F (36.7 C) (Oral)   Resp 18   Ht 5\' 7"  (1.702 m)   Wt 65 kg   SpO2 100%   BMI 22.44 kg/m  Physical Exam Vitals and nursing note reviewed.  Constitutional:      Appearance: He is well-developed.  HENT:     Head: Normocephalic.  Pulmonary:     Effort: Pulmonary  effort is normal.  Abdominal:     General: There is no distension.  Musculoskeletal:        General: Normal range of motion.     Cervical back: Normal range of motion.  Neurological:     General: No focal deficit present.     Mental Status: He is alert and oriented to person, place, and time.     ED Results / Procedures / Treatments   Labs (all labs ordered are listed, but only abnormal results are displayed) Labs Reviewed - No data to display  EKG None  Radiology No results found.  Procedures Procedures    Medications Ordered in ED Medications - No data to display  ED Course/ Medical Decision Making/ A&P                           Medical Decision Making Pt cold wet and has been drinking   Risk Risk Details: Pt changed into dry clothes, given a blanket,  warm broth.              Final Clinical Impression(s) / ED Diagnoses Final diagnoses:  Alcoholic intoxication without complication (HCC)    Rx / DC Orders ED Discharge Orders     None      An  After Visit Summary was printed and given to the patient.    Osie Cheeks 03/21/22 1152    Rondel Baton, MD 03/22/22 [phone]

## 2022-03-21 NOTE — ED Triage Notes (Signed)
BIBA from parking deck. Pt reports drinking all night and wanting out of the cold.

## 2022-03-21 NOTE — ED Provider Triage Note (Signed)
Emergency Medicine Provider Triage Evaluation Note  Alejandro Baker , a 46 y.o. male  was evaluated in triage.  Pt complains of getting cold and wet.  Pt outside overnight.   Review of Systems  Positive: Cold  Negative: No fever  Physical Exam  BP (!) 144/112 (BP Location: Right Arm)   Pulse 98   Temp 98 F (36.7 C) (Oral)   Resp 18   SpO2 100%  Gen:   Awake, no distress   Resp:  Normal effort  MSK:   Moves extremities without difficulty  Other:    Medical Decision Making  Medically screening exam initiated at 8:12 AM.  Appropriate orders placed.  Demichael Traum was informed that the remainder of the evaluation will be completed by another provider, this initial triage assessment does not replace that evaluation, and the importance of remaining in the ED until their evaluation is complete.     Elson Areas, [place] 03/21/22 [phone]

## 2022-03-21 NOTE — ED Notes (Signed)
Pt provided shirt and warm blankets.  Temp 4F oral didn't apply bair hugger

## 2022-03-21 NOTE — ED Notes (Signed)
Warm fluids provided: coffee and chicken broth.

## 2022-04-10 ENCOUNTER — Encounter (HOSPITAL_COMMUNITY): Payer: Self-pay

## 2022-04-10 ENCOUNTER — Emergency Department (HOSPITAL_COMMUNITY)
Admission: EM | Admit: 2022-04-10 | Discharge: 2022-04-10 | Disposition: A | Discharge status: Home / Self Care | Payer: PPO | Attending: Emergency Medicine | Admitting: Emergency Medicine

## 2022-04-10 ENCOUNTER — Other Ambulatory Visit (HOSPITAL_COMMUNITY): Payer: Self-pay

## 2022-04-10 ENCOUNTER — Other Ambulatory Visit: Payer: Self-pay

## 2022-04-10 DIAGNOSIS — M542 Cervicalgia: Secondary | ICD-10-CM | POA: Insufficient documentation

## 2022-04-10 DIAGNOSIS — I10 Essential (primary) hypertension: Secondary | ICD-10-CM | POA: Diagnosis not present

## 2022-04-10 DIAGNOSIS — W19XXXA Unspecified fall, initial encounter: Secondary | ICD-10-CM | POA: Diagnosis not present

## 2022-04-10 DIAGNOSIS — R739 Hyperglycemia, unspecified: Secondary | ICD-10-CM | POA: Diagnosis not present

## 2022-04-10 MED ORDER — RISPERIDONE 2 MG PO TABS: 2.0000 mg | ORAL_TABLET | Freq: Every day | ORAL | 1 refills | Status: DC

## 2022-04-10 MED ORDER — RISPERIDONE 0.5 MG PO TABS
2.0000 mg | ORAL_TABLET | Freq: Once | ORAL | Status: AC
  Administered 2022-04-10: 2 mg via ORAL
  Filled 2022-04-10: qty 4

## 2022-04-10 MED ORDER — RISPERIDONE 2 MG PO TABS
2.0000 mg | ORAL_TABLET | Freq: Every day | ORAL | 1 refills | Status: DC
  Filled 2022-04-10: qty 90, 90d supply, fill #0

## 2022-04-10 NOTE — ED Provider Notes (Signed)
[facility]-EMERGENCY DEPT Provider Note   CSN: [id number] Arrival date & time: 04/10/22  0116     History  Chief Complaint  Patient presents with   Neck Pain    Alejandro Baker is a 46 y.o. male.  Patient presenting via EMS after being found sleeping on the floor in a [place] bathroom.  Initial report was that he may have fallen and hit his head but denies this at present.  On further assessment, he denies any acute pain anywhere and is spotting though he requests something to eat and a refill of his Risperdal.  He notes that he has a history of "bipolar and schizophrenia" and has been out of his Risperdal for "a while."  He denies any hallucinations at present.  He denies any history of drinking last night though notes that he does have a heavy alcohol use history.        Home Medications Prior to Admission medications   Medication Sig Start Date End Date Taking? Authorizing Provider  albuterol (VENTOLIN HFA) 108 (90 Base) MCG/ACT inhaler Inhale 1-2 puffs into the lungs every 6 (six) hours as needed for wheezing or shortness of breath. 05/19/21   Prosperi, Christian H, PA-C  fluticasone (FLONASE) 50 MCG/ACT nasal spray Place 1 spray into both nostrils daily. 05/19/21   Prosperi, Christian H, PA-C  metFORMIN (GLUCOPHAGE) 500 MG tablet Take 1 tablet (500 mg total) by mouth 2 (two) times daily with a meal. 06/02/21   [name], MD  risperiDONE (RISPERDAL) 2 MG tablet Take 1 tablet (2 mg total) by mouth at bedtime. 04/10/22 10/07/22  Alicia Amel, MD      Allergies    Penicillins and Povidone-iodine    Review of Systems   Review of Systems  Constitutional:  Negative for fatigue and fever.  Respiratory:  Negative for shortness of breath.   Cardiovascular:  Negative for chest pain.  Musculoskeletal:  Negative for joint swelling, neck pain and neck stiffness.  Neurological:  Positive for headaches (yesterday, transient, resolved). Negative for dizziness,  syncope and numbness.  Psychiatric/Behavioral:  Negative for hallucinations.   All other systems reviewed and are negative.   Physical Exam Updated Vital Signs BP (!) 146/100 (BP Location: Right Arm)   Pulse 94   Temp 98 F (36.7 C) (Oral)   Resp 16   Ht 5\' 11"  (1.803 m)   Wt 90.7 kg   SpO2 100%   BMI 27.89 kg/m  Physical Exam Vitals reviewed.  Constitutional:      General: He is not in acute distress. HENT:     Head:     Comments: Head atraumatic, non-tender scalp    Nose: Nose normal.     Mouth/Throat:     Mouth: Mucous membranes are moist.  Eyes:     Extraocular Movements: Extraocular movements intact.     Pupils: Pupils are equal, round, and reactive to light.  Cardiovascular:     Rate and Rhythm: Normal rate and regular rhythm.     Heart sounds: No murmur heard. Pulmonary:     Effort: Pulmonary effort is normal.     Breath sounds: Normal breath sounds.  Abdominal:     General: There is no distension.     Tenderness: There is no abdominal tenderness.  Musculoskeletal:     Comments: Neck without deformity. Supple and without bony or paraspinal tenderness. FROM in all planes without pain.  Neurological:     Comments: A&O x4, speech is slurred but  patient answering all questions appropriately and following commands, Strength and sensation intact throughout, Gait normal, good balance, neg Romberg     ED Results / Procedures / Treatments   Labs (all labs ordered are listed, but only abnormal results are displayed) Labs Reviewed - No data to display  EKG None  Radiology No results found.  Procedures Procedures   Medications Ordered in ED Medications  risperiDONE (RISPERDAL) tablet 2 mg (2 mg Oral Given 04/10/22 0825)    ED Course/ Medical Decision Making/ A&P                           Medical Decision Making Patient presenting after being found sleeping at a [place] bathroom.  Questionable traumatic history, however normal neuroexam aside from slurred  speech.  Patient with a history of heavy alcohol use disorder and known psychiatric disorders.  Not acutely psychotic.  Head is atraumatic.  Do not see an indication to scan his head at this time especially given that we are about 7 hours out from his presentation at this time and he remains stable.  Neck without bony tenderness or impaired range of motion.  Provided patient with food.  Will refill Risperdal, first dose here, and discharge home.   Final Clinical Impression(s) / ED Diagnoses Final diagnoses:  Neck pain    Rx / DC Orders ED Discharge Orders          Ordered    risperiDONE (RISPERDAL) 2 MG tablet  Daily at bedtime,   Status:  Discontinued        04/10/22 0821    risperiDONE (RISPERDAL) 2 MG tablet  Daily at bedtime        04/10/22 0826           [name], MD    Alicia Amel, MD 04/10/22 9292    Maia Plan, MD 04/11/22 [phone]

## 2022-04-10 NOTE — Discharge Instructions (Addendum)
Alejandro Baker,  I sent a prescription in for your Risperdal to the [facility].  Please go by there to pick this up. We need to get you in with  It does not look like you injured your head or neck and I hope that you have a good day!  Be well,  Alejandro Gibbs, MD  Substance Abuse Treatment Programs  Intensive Outpatient Programs [facility]     [address]      [place], [place]                   [phone]       [facility] [address] [place], [place] [phone]  [facility]     (Inpatient and outpatient)     [address]           [phone]    [facility] [phone] (Suboxone and Methadone)  [address]      [place], [place] [address]      [phone]       [address] [place], [place] [phone]  [facility] (Outpatient/Inpatient, [facility])    (insurance only) [phone]             [facility] (Groups & Residential) [place], [place] [phone]     [facility]     [address]     [place], [place]      [phone]       [facility] (for caregivers and family) [phone] [address] [place], [place] [phone]      Residential Treatment Programs [facility]      [address] [place], [place] [address]  [phone]       [facility] [address], [place], [place] [address] [phone]  [facility] [place]        [phone]       [facility] [phone]  [facility] ([facility]             [address]                                         [place], [place]                                                [phone] or [phone]                               [facility] [address] [place], [address] [phone]  [facility]    [address]      [place], [place]     [phone]       [facility] [address] [place], [place] [phone]  [facility]   [address] [place], [place] [address]     [phone]      Admissions: 8am-3pm M-F  [facility] (RTS) [address] [place], [place] [phone]  [facility]: Residential Program [phone] Days)   [place], [place]      [phone] or [phone]  [facility]: [facility] [place], [place] (Walk in Hours over the weekend or by referral)  [facility] [address] [place], [place], [place] [address] [phone]  Crisis Mobile: [facility]:  [phone] (for crisis response 24 hours a day) [facility] Hotline:      [phone] Outpatient Psychiatry and Counseling  [facility]: Mobile Crisis Management 24 hours:  [phone]  [facility] [employer] sliding scale fee and walk in schedule: M-F 8am-12pm/1pm-3pm [address]  [place], [place] [address] [phone]  [facility] [address] [place], [place] [address] [phone]  [facility] ([facility])- new patient walk-in appointments available Monday - Friday 8am -3pm.          [address] [place], [place] [address] [phone] or crisis line- [phone]  [facility] Out[facility] Services/ Intensive Outpatient Therapy Program [address] [place], [place] [address] [phone]  [facility]                  Crisis Services      [phone] [address]     [place], [place] [address]                 [facility]   [facility] [phone]. [address] [place], [place] [address]   [facility]          [address]  [place], [place] [address]       [phone]  [facility] [address] [place], [place]  [address] [phone]  [facility]    [address]    [place], [place] [address]     [phone]       Andee Poles, MD     [address]     [place] [address]     [phone]       [facility] [address] [place] [address]  [facility]     [address] [place], [place]      [phone]       [facility] Eulogio Ditch, MD [address] [place], [place] [address] [phone]  [facility]     [address]     [place], [place] [address]     [phone]       [facility] [address] [place], [place] [address] [phone] Resources for Temporary Residential Assistance/Crisis Centers  [facility] CENT[facility]) M-F 8am-3pm   [address] [place], [place] [address]   [phone] Services include: laundry, barbering, support groups, case management, phone  & computer access, showers, AA/NA mtgs, mental health/substance abuse nurse, job skills class, disability information, VA assistance, spiritual classes, etc.   HOMELESS SHELTERS  [facility]     [facility]   [address] [place]     [phone]              [employer] (women and children)       [address] [place], [place] [address] [phone] [email or web address] for application and process Application Required  [facility]   [address]    [place] [address]     [phone]  [place], [place] [address] [phone]([email or web address] application appt.) Application Required  [facility] (women only)    [address]     [place], [place] [address]     [phone]      Intake starts 6pm daily Need valid ID, SSC, & Police report [employer] [address] [place], [place] [phone] Application Required  [employer] (men only)     [address]      [place], [place]       [facility] (Pregnant women only) [address] [place], [place]  [facility]      [place] [address]      [place], [place] [address]     [phone]             [facility] [address] [place], [place] 90 day commitment/SA/Application process  [facility](men only)     [address]     [place], [place]       Check-in at [facility] [address] [place], [place] [address] [phone] Men/Women/Women and Children must be there by 7 pm  [place], [place]

## 2022-04-10 NOTE — ED Triage Notes (Addendum)
Pt BIB EMS with reports of neck pain for 30 mins. Pt was lying on the [place] bathroom floor.

## 2022-04-11 ENCOUNTER — Emergency Department (HOSPITAL_COMMUNITY)
Admission: EM | Admit: 2022-04-11 | Discharge: 2022-04-11 | Discharge status: Against Medical Advice | Payer: PPO | Attending: Emergency Medicine | Admitting: Emergency Medicine

## 2022-04-11 ENCOUNTER — Other Ambulatory Visit: Payer: Self-pay

## 2022-04-11 ENCOUNTER — Other Ambulatory Visit (HOSPITAL_COMMUNITY): Payer: Self-pay

## 2022-04-11 ENCOUNTER — Encounter (HOSPITAL_COMMUNITY): Payer: Self-pay

## 2022-04-11 DIAGNOSIS — Z5321 Procedure and treatment not carried out due to patient leaving prior to being seen by health care provider: Secondary | ICD-10-CM | POA: Insufficient documentation

## 2022-04-11 DIAGNOSIS — R109 Unspecified abdominal pain: Secondary | ICD-10-CM | POA: Insufficient documentation

## 2022-04-11 DIAGNOSIS — R1084 Generalized abdominal pain: Secondary | ICD-10-CM | POA: Diagnosis not present

## 2022-04-11 LAB — COMPREHENSIVE METABOLIC PANEL
ALT: 23 U/L (ref 0–44)
AST: 22 U/L (ref 15–41)
Albumin: 4.2 g/dL (ref 3.5–5.0)
Alkaline Phosphatase: 69 U/L (ref 38–126)
Anion gap: 8 (ref 5–15)
BUN: 15 mg/dL (ref 6–20)
CO2: 27 mmol/L (ref 22–32)
Calcium: 8.9 mg/dL (ref 8.9–10.3)
Chloride: 101 mmol/L (ref 98–111)
Creatinine, Ser: 0.7 mg/dL (ref 0.61–1.24)
GFR, Estimated: >60 mL/min (ref >60)
Glucose, Bld: 323 mg/dL — ABNORMAL HIGH (ref 70–99)
Potassium: 4 mmol/L (ref 3.5–5.1)
Sodium: 136 mmol/L (ref 135–145)
Total Bilirubin: 0.7 mg/dL (ref 0.3–1.2)
Total Protein: 7.6 g/dL (ref 6.5–8.1)

## 2022-04-11 LAB — CBC
HCT: 47.6 % (ref 39.0–52.0)
Hemoglobin: 15.4 g/dL (ref 13.0–17.0)
MCH: 29.7 pg (ref 26.0–34.0)
MCHC: 32.4 g/dL (ref 30.0–36.0)
MCV: 91.7 fL (ref 80.0–100.0)
Platelets: 349 10*3/uL (ref 150–400)
RBC: 5.19 MIL/uL (ref 4.22–5.81)
RDW: 13.2 % (ref 11.5–15.5)
WBC: 8.9 10*3/uL (ref 4.0–10.5)
nRBC: 0 % (ref 0.0–0.2)

## 2022-04-11 LAB — LIPASE, BLOOD: Lipase: 32 U/L (ref 11–51)

## 2022-04-11 NOTE — ED Triage Notes (Signed)
[facility] states pt is homeless and was walking down the road and c/o abdominal pain for the past 6 hours.

## 2022-04-20 ENCOUNTER — Other Ambulatory Visit (HOSPITAL_COMMUNITY): Payer: Self-pay

## 2022-04-22 ENCOUNTER — Emergency Department (HOSPITAL_COMMUNITY): Payer: PPO

## 2022-04-22 ENCOUNTER — Emergency Department (HOSPITAL_COMMUNITY)
Admission: EM | Admit: 2022-04-22 | Discharge: 2022-04-22 | Disposition: A | Discharge status: Home / Self Care | Payer: PPO | Attending: Emergency Medicine | Admitting: Emergency Medicine

## 2022-04-22 DIAGNOSIS — R059 Cough, unspecified: Secondary | ICD-10-CM | POA: Diagnosis not present

## 2022-04-22 DIAGNOSIS — R079 Chest pain, unspecified: Secondary | ICD-10-CM | POA: Diagnosis not present

## 2022-04-22 DIAGNOSIS — Z20822 Contact with and (suspected) exposure to covid-19: Secondary | ICD-10-CM | POA: Diagnosis not present

## 2022-04-22 DIAGNOSIS — R112 Nausea with vomiting, unspecified: Secondary | ICD-10-CM | POA: Diagnosis not present

## 2022-04-22 DIAGNOSIS — R197 Diarrhea, unspecified: Secondary | ICD-10-CM | POA: Diagnosis not present

## 2022-04-22 DIAGNOSIS — R110 Nausea: Secondary | ICD-10-CM | POA: Diagnosis not present

## 2022-04-22 DIAGNOSIS — K529 Noninfective gastroenteritis and colitis, unspecified: Secondary | ICD-10-CM | POA: Diagnosis not present

## 2022-04-22 DIAGNOSIS — R109 Unspecified abdominal pain: Secondary | ICD-10-CM | POA: Diagnosis not present

## 2022-04-22 LAB — TROPONIN I (HIGH SENSITIVITY)
Troponin I (High Sensitivity): 5 ng/L (ref <18)
Troponin I (High Sensitivity): 4 ng/L (ref <18)

## 2022-04-22 LAB — CBC WITH DIFFERENTIAL/PLATELET
Abs Immature Granulocytes: 0.04 10*3/uL (ref 0.00–0.07)
Basophils Absolute: 0 10*3/uL (ref 0.0–0.1)
Basophils Relative: 0 %
Eosinophils Absolute: 0 10*3/uL (ref 0.0–0.5)
Eosinophils Relative: 0 %
HCT: 48 % (ref 39.0–52.0)
Hemoglobin: 15.9 g/dL (ref 13.0–17.0)
Immature Granulocytes: 0 %
Lymphocytes Relative: 20 %
Lymphs Abs: 1.9 10*3/uL (ref 0.7–4.0)
MCH: 29.6 pg (ref 26.0–34.0)
MCHC: 33.1 g/dL (ref 30.0–36.0)
MCV: 89.4 fL (ref 80.0–100.0)
Monocytes Absolute: 1 10*3/uL (ref 0.1–1.0)
Monocytes Relative: 11 %
Neutro Abs: 6.4 10*3/uL (ref 1.7–7.7)
Neutrophils Relative %: 69 %
Platelets: 258 10*3/uL (ref 150–400)
RBC: 5.37 MIL/uL (ref 4.22–5.81)
RDW: 12.9 % (ref 11.5–15.5)
WBC: 9.5 10*3/uL (ref 4.0–10.5)
nRBC: 0 % (ref 0.0–0.2)

## 2022-04-22 LAB — COMPREHENSIVE METABOLIC PANEL
ALT: 26 U/L (ref 0–44)
AST: 27 U/L (ref 15–41)
Albumin: 3.9 g/dL (ref 3.5–5.0)
Alkaline Phosphatase: 64 U/L (ref 38–126)
Anion gap: 12 (ref 5–15)
BUN: 21 mg/dL — ABNORMAL HIGH (ref 6–20)
CO2: 22 mmol/L (ref 22–32)
Calcium: 8.8 mg/dL — ABNORMAL LOW (ref 8.9–10.3)
Chloride: 99 mmol/L (ref 98–111)
Creatinine, Ser: 0.77 mg/dL (ref 0.61–1.24)
GFR, Estimated: >60 mL/min (ref >60)
Glucose, Bld: 239 mg/dL — ABNORMAL HIGH (ref 70–99)
Potassium: 3.7 mmol/L (ref 3.5–5.1)
Sodium: 133 mmol/L — ABNORMAL LOW (ref 135–145)
Total Bilirubin: 0.5 mg/dL (ref 0.3–1.2)
Total Protein: 6.9 g/dL (ref 6.5–8.1)

## 2022-04-22 LAB — RESP PANEL BY RT-PCR (RSV, FLU A&B, COVID)  RVPGX2
Influenza A by PCR: NEGATIVE
Influenza B by PCR: NEGATIVE
Resp Syncytial Virus by PCR: NEGATIVE
SARS Coronavirus 2 by RT PCR: NEGATIVE

## 2022-04-22 LAB — LIPASE, BLOOD: Lipase: 44 U/L (ref 11–51)

## 2022-04-22 NOTE — Discharge Instructions (Signed)
Your exam and workup today was overall reassuring.  No concerning cause of your symptoms.  If you develop worsening symptoms or concerning symptoms please return to the emergency room otherwise follow-up with your primary care provider.  If you do not have one appetizer information for [facility] above.

## 2022-04-22 NOTE — ED Provider Triage Note (Signed)
Emergency Medicine Provider Triage Evaluation Note  Maxim Bedel , a 46 y.o. male  was evaluated in triage.  Pt complains of N/V/D, abdominal pain and shortness of breathx2 weeks. No fever or chills. Endorses rash to chest.  Review of Systems  Positive: SOB, abd pain, N/V/D Negative: Fever, chills  Physical Exam  BP 106/79 (BP Location: Right Arm)   Pulse (!) 136   Temp 98.8 F (37.1 C) (Oral)   Resp 17   Ht 5\' 11"  (1.803 m)   Wt 90.7 kg   SpO2 97%   BMI 27.89 kg/m  Gen:   Awake, no distress   Resp:  Normal effort  MSK:   Moves extremities without difficulty  Other:  +erythema to chest wall  Medical Decision Making  Medically screening exam initiated at 4:42 PM.  Appropriate orders placed.  Omran Keelin was informed that the remainder of the evaluation will be completed by another provider, this initial triage assessment does not replace that evaluation, and the importance of remaining in the ED until their evaluation is complete.    [name], Pete Pelt 04/22/22 1643

## 2022-04-22 NOTE — ED Triage Notes (Signed)
Pt to ED c/o cold symptoms x 4 days. Reports congestion, nausea, cough.

## 2022-04-22 NOTE — ED Provider Notes (Signed)
[facility] EMERGENCY DEPARTMENT Provider Note   CSN: [id number] Arrival date & time: 04/22/22  1631     History  Chief Complaint  Patient presents with   URI    Alejandro Baker is a 46 y.o. male.  46 year old male with schizoaffective disorder presents today for evaluation of nausea, vomiting, diarrhea for the past couple weeks.  Denies chest pain, shortness of breath.  Denies SI, HI, or AVH.  He has history of homelessness.  Denies fever, chills.  Tolerating p.o. intake without difficulty.  The history is provided by the patient. No language interpreter was used.       Home Medications Prior to Admission medications   Medication Sig Start Date End Date Taking? Authorizing Provider  albuterol (VENTOLIN HFA) 108 (90 Base) MCG/ACT inhaler Inhale 1-2 puffs into the lungs every 6 (six) hours as needed for wheezing or shortness of breath. 05/19/21   Prosperi, Christian H, PA-C  fluticasone (FLONASE) 50 MCG/ACT nasal spray Place 1 spray into both nostrils daily. 05/19/21   Prosperi, Christian H, PA-C  metFORMIN (GLUCOPHAGE) 500 MG tablet Take 1 tablet (500 mg total) by mouth 2 (two) times daily with a meal. 06/02/21   [name], MD  risperiDONE (RISPERDAL) 2 MG tablet Take 1 tablet (2 mg total) by mouth at bedtime. 04/10/22 10/07/22  Alicia Amel, MD      Allergies    Penicillins and Povidone-iodine    Review of Systems   Review of Systems  Constitutional:  Negative for chills and fever.  HENT:  Negative for trouble swallowing.   Respiratory:  Negative for shortness of breath.   Cardiovascular:  Negative for chest pain.  Gastrointestinal:  Positive for nausea and vomiting. Negative for abdominal pain.  Neurological:  Negative for light-headedness.  All other systems reviewed and are negative.   Physical Exam Updated Vital Signs BP 126/78   Pulse 99   Temp 98.5 F (36.9 C) (Oral)   Resp 17   Ht 5\' 11"  (1.803 m)   Wt 90.7 kg   SpO2 98%   BMI  27.89 kg/m  Physical Exam Vitals and nursing note reviewed.  Constitutional:      General: He is not in acute distress.    Appearance: Normal appearance. He is not ill-appearing.  HENT:     Head: Normocephalic and atraumatic.     Nose: Nose normal.  Eyes:     General: No scleral icterus.    Extraocular Movements: Extraocular movements intact.     Conjunctiva/sclera: Conjunctivae normal.  Cardiovascular:     Rate and Rhythm: Normal rate and regular rhythm.     Pulses: Normal pulses.  Pulmonary:     Effort: Pulmonary effort is normal. No respiratory distress.     Breath sounds: Normal breath sounds. No wheezing or rales.  Abdominal:     General: There is no distension.     Palpations: Abdomen is soft.     Tenderness: There is no abdominal tenderness. There is no guarding.  Musculoskeletal:        General: Normal range of motion.     Cervical back: Normal range of motion.     Right lower leg: No edema.     Left lower leg: No edema.  Skin:    General: Skin is warm and dry.  Neurological:     General: No focal deficit present.     Mental Status: He is alert and oriented to person, place, and time. Mental status is  at baseline.     ED Results / Procedures / Treatments   Labs (all labs ordered are listed, but only abnormal results are displayed) Labs Reviewed  COMPREHENSIVE METABOLIC PANEL - Abnormal; Notable for the following components:      Result Value   Sodium 133 (*)    Glucose, Bld 239 (*)    BUN 21 (*)    Calcium 8.8 (*)    All other components within normal limits  RESP PANEL BY RT-PCR (RSV, FLU A&B, COVID)  [id number]  CBC WITH DIFFERENTIAL/PLATELET  LIPASE, BLOOD  URINALYSIS, ROUTINE W REFLEX MICROSCOPIC  TROPONIN I (HIGH SENSITIVITY)  TROPONIN I (HIGH SENSITIVITY)    EKG None  Radiology DG Chest 2 View  Result Date: 04/22/2022 CLINICAL DATA:  Chest pain, congestion, nausea, and cough. EXAM: CHEST - 2 VIEW COMPARISON:  06/02/2021. FINDINGS: The heart  size and mediastinal contours are within normal limits. No consolidation, effusion, or pneumothorax. Degenerative changes are present in the thoracic spine. No acute osseous abnormality. IMPRESSION: No active cardiopulmonary disease. Electronically Signed   By: Thornell Sartorius M.D.   On: 04/22/2022 20:23    Procedures Procedures    Medications Ordered in ED Medications - No data to display  ED Course/ Medical Decision Making/ A&P Clinical Course as of 04/22/22 2222  Sun Apr 22, 2022  1922 EKG 12-Lead [BS]    Clinical Course User Index [BS] Small, Harley [place], [place]                           Medical Decision Making Amount and/or Complexity of Data Reviewed Radiology: ordered.   46 year old male presents today for evaluation of above-mentioned complaints.  He is disheveled in appearance otherwise without acute distress or acute concerns.  During interview he is actually drawn out a football play that he also explains to me as well.  Tolerating p.o. intake.  History of schizoaffective disorder but currently without SI, HI, or AVH.  Workup overall reassuring.  Glucose elevated at 239 otherwise CMP unremarkable.  CBC unremarkable.  Troponin negative x 2.  Lipase within normal limits.  COVID, flu, RSV negative.  Chest x-ray without acute cardiopulmonary process.  EKG without acute ischemic changes.  Admission considered however given reassuring workup, soft and nontender abdomen low suspicion for acute cause of patient's symptoms.  He is afebrile.  Initially tachycardic which did improve throughout his stay.  He is stable for discharge.  Return precaution discussed.  Patient voices understanding and is in agreement with plan.   Final Clinical Impression(s) / ED Diagnoses Final diagnoses:  Gastroenteritis    Rx / DC Orders ED Discharge Orders     None         Marita [place], PA-C 04/22/22 2227    Tanda Rockers A, DO 04/23/22 1829

## 2022-04-27 ENCOUNTER — Emergency Department (HOSPITAL_COMMUNITY)
Admission: EM | Admit: 2022-04-27 | Discharge: 2022-04-27 | Discharge status: Against Medical Advice | Payer: PPO | Attending: Emergency Medicine | Admitting: Emergency Medicine

## 2022-04-27 ENCOUNTER — Other Ambulatory Visit: Payer: Self-pay

## 2022-04-27 ENCOUNTER — Emergency Department (HOSPITAL_COMMUNITY): Payer: PPO

## 2022-04-27 DIAGNOSIS — R0602 Shortness of breath: Secondary | ICD-10-CM | POA: Insufficient documentation

## 2022-04-27 DIAGNOSIS — Z5321 Procedure and treatment not carried out due to patient leaving prior to being seen by health care provider: Secondary | ICD-10-CM | POA: Diagnosis not present

## 2022-04-27 DIAGNOSIS — Z5900 Homelessness unspecified: Secondary | ICD-10-CM | POA: Insufficient documentation

## 2022-04-27 LAB — CBC
HCT: 48.3 % (ref 39.0–52.0)
Hemoglobin: 16.9 g/dL (ref 13.0–17.0)
MCH: 31.4 pg (ref 26.0–34.0)
MCHC: 35 g/dL (ref 30.0–36.0)
MCV: 89.6 fL (ref 80.0–100.0)
Platelets: 320 10*3/uL (ref 150–400)
RBC: 5.39 MIL/uL (ref 4.22–5.81)
RDW: 13 % (ref 11.5–15.5)
WBC: 10.3 10*3/uL (ref 4.0–10.5)
nRBC: 0 % (ref 0.0–0.2)

## 2022-04-27 LAB — BASIC METABOLIC PANEL
Anion gap: 7 (ref 5–15)
BUN: 7 mg/dL (ref 6–20)
CO2: 28 mmol/L (ref 22–32)
Calcium: 8.9 mg/dL (ref 8.9–10.3)
Chloride: 99 mmol/L (ref 98–111)
Creatinine, Ser: 0.63 mg/dL (ref 0.61–1.24)
GFR, Estimated: >60 mL/min (ref >60)
Glucose, Bld: 253 mg/dL — ABNORMAL HIGH (ref 70–99)
Potassium: 4.2 mmol/L (ref 3.5–5.1)
Sodium: 134 mmol/L — ABNORMAL LOW (ref 135–145)

## 2022-04-27 NOTE — ED Notes (Signed)
No answer for room 

## 2022-04-27 NOTE — ED Triage Notes (Signed)
EMS stated, SOB from the cold weather . Pt is homeless.

## 2022-04-27 NOTE — ED Notes (Signed)
This tech called for pt in waiting room x3. This tech also checked triage and pt wasn't back there either.

## 2022-04-28 ENCOUNTER — Encounter (HOSPITAL_COMMUNITY): Payer: Self-pay | Admitting: *Deleted

## 2022-04-28 ENCOUNTER — Other Ambulatory Visit: Payer: Self-pay

## 2022-04-28 ENCOUNTER — Emergency Department (HOSPITAL_COMMUNITY)
Admission: EM | Admit: 2022-04-28 | Discharge: 2022-04-28 | Disposition: A | Discharge status: Home / Self Care | Payer: PPO | Attending: Emergency Medicine | Admitting: Emergency Medicine

## 2022-04-28 DIAGNOSIS — R079 Chest pain, unspecified: Secondary | ICD-10-CM | POA: Insufficient documentation

## 2022-04-28 DIAGNOSIS — I10 Essential (primary) hypertension: Secondary | ICD-10-CM | POA: Diagnosis not present

## 2022-04-28 DIAGNOSIS — E1165 Type 2 diabetes mellitus with hyperglycemia: Secondary | ICD-10-CM | POA: Diagnosis not present

## 2022-04-28 DIAGNOSIS — R0789 Other chest pain: Secondary | ICD-10-CM

## 2022-04-28 DIAGNOSIS — Z7984 Long term (current) use of oral hypoglycemic drugs: Secondary | ICD-10-CM | POA: Insufficient documentation

## 2022-04-28 LAB — CBC WITH DIFFERENTIAL/PLATELET
Abs Immature Granulocytes: 0.08 10*3/uL — ABNORMAL HIGH (ref 0.00–0.07)
Basophils Absolute: 0.1 10*3/uL (ref 0.0–0.1)
Basophils Relative: 1 %
Eosinophils Absolute: 0.2 10*3/uL (ref 0.0–0.5)
Eosinophils Relative: 1 %
HCT: 43.9 % (ref 39.0–52.0)
Hemoglobin: 14.5 g/dL (ref 13.0–17.0)
Immature Granulocytes: 1 %
Lymphocytes Relative: 38 %
Lymphs Abs: 4 10*3/uL (ref 0.7–4.0)
MCH: 29.3 pg (ref 26.0–34.0)
MCHC: 33 g/dL (ref 30.0–36.0)
MCV: 88.7 fL (ref 80.0–100.0)
Monocytes Absolute: 0.7 10*3/uL (ref 0.1–1.0)
Monocytes Relative: 7 %
Neutro Abs: 5.5 10*3/uL (ref 1.7–7.7)
Neutrophils Relative %: 52 %
Platelets: 326 10*3/uL (ref 150–400)
RBC: 4.95 MIL/uL (ref 4.22–5.81)
RDW: 13.1 % (ref 11.5–15.5)
WBC: 10.5 10*3/uL (ref 4.0–10.5)
nRBC: 0 % (ref 0.0–0.2)

## 2022-04-28 LAB — COMPREHENSIVE METABOLIC PANEL
ALT: 25 U/L (ref 0–44)
AST: 25 U/L (ref 15–41)
Albumin: 3.6 g/dL (ref 3.5–5.0)
Alkaline Phosphatase: 58 U/L (ref 38–126)
Anion gap: 8 (ref 5–15)
BUN: 11 mg/dL (ref 6–20)
CO2: 25 mmol/L (ref 22–32)
Calcium: 8.8 mg/dL — ABNORMAL LOW (ref 8.9–10.3)
Chloride: 98 mmol/L (ref 98–111)
Creatinine, Ser: 0.76 mg/dL (ref 0.61–1.24)
GFR, Estimated: >60 mL/min (ref >60)
Glucose, Bld: 306 mg/dL — ABNORMAL HIGH (ref 70–99)
Potassium: 3.7 mmol/L (ref 3.5–5.1)
Sodium: 131 mmol/L — ABNORMAL LOW (ref 135–145)
Total Bilirubin: 0.3 mg/dL (ref 0.3–1.2)
Total Protein: 6.8 g/dL (ref 6.5–8.1)

## 2022-04-28 LAB — D-DIMER, QUANTITATIVE: D-Dimer, Quant: <0.27 ug/mL-FEU (ref 0.00–0.50)

## 2022-04-28 LAB — TROPONIN I (HIGH SENSITIVITY)
Troponin I (High Sensitivity): 5 ng/L (ref <18)
Troponin I (High Sensitivity): 6 ng/L (ref <18)

## 2022-04-28 NOTE — Discharge Instructions (Signed)
Your workup today was reassuring.  Follow-up with cardiology regarding your symptoms.  They should call and schedule an appointment, if you do not hear from them call yourself.  Turn to the ED for new or concerning symptoms, loss of consciousness, coughing up blood, severe pain which posterior back.

## 2022-04-28 NOTE — ED Notes (Signed)
Patient lethargic at this time, not answering any questions asked by this RN. Patient requesting [place] to leave him alone. Provider aware.

## 2022-04-28 NOTE — ED Triage Notes (Signed)
This is the third time this pt has checked in the department for the last 24 hours  its doubtful if he has ever left the hospital grounds  his tongue is thick as if medicated/  he is c/o the outside of his skin icold but the inside is hot and he hurts throughout his entire chest

## 2022-04-28 NOTE — ED Provider Triage Note (Addendum)
Emergency Medicine Provider Triage Evaluation Note  Alejandro Baker , a 46 y.o. male  was evaluated in triage.  Patient has a history of schizoaffective disorder and frequent ED visits.  Pt complains of chest pain, lower right chest.  Also shortness of breath without cough.  Denies fever.  Patient states that he feels like the pain is "burning" on the outside.  Patient was seen yesterday and left.  He did have an EKG and chest x-ray performed.  He was also seen approximately 1 week ago and had normal troponins at that time.   Review of Systems  Positive: Chest pain, shortness of breath Negative: Fever, cough  Physical Exam  BP (!) 139/97   Pulse 85   Temp (!) 97.5 F (36.4 C)   Resp 18   Ht 5\' 11"  (1.803 m)   Wt 90.7 kg   SpO2 100%   BMI 27.89 kg/m  Gen:   Awake, no distress   Resp:  Normal effort  MSK:   Moves extremities without difficulty  Other:  Blunt affect  Medical Decision Making  Medically screening exam initiated at 4:35 AM.  Appropriate orders placed.  Filip Luten was informed that the remainder of the evaluation will be completed by another provider, this initial triage assessment does not replace that evaluation, and the importance of remaining in the ED until their evaluation is complete.  I reviewed the patient's recent workup.  From 12/24 patient had tachycardia on EKG and normal troponins.  When he was seen yesterday, EKG now demonstrates inferolateral T wave changes which do not appear to be present on previous EKGs.  For this reason, will repeat EKG, and lab work including 1 troponin.  He had a chest x-ray yesterday and lungs are clear on exam, do not feel that this needs to be repeated.  He is not hypoxic or tachycardic today.   1/25, PA-C 04/28/22 0443    5:05 AM unfortunately the patient has once again eloped.  Blood work was sent, will follow-up on troponin.  6:09 AM Patient is apparently back in waiting room.     04/30/22, PA-C 04/28/22  0505    04/30/22, PA-C 04/28/22 [phone]

## 2022-04-28 NOTE — ED Provider Notes (Cosign Needed)
[facility] EMERGENCY DEPARTMENT Provider Note   CSN: [id number] Arrival date & time: 04/28/22  0416     History  Chief Complaint  Patient presents with   Chest Pain    Alejandro Baker is a 46 y.o. male.   Chest Pain    Patient with medical history of hypertension, diabetes, schizoaffective/schizophrenia, substance-induced mood disorder, polysubstance use disorder presents to the emergency department due to chest pain.  Patient is a guarded historian and not answering most of my questions with history.  He did tell the triage provider that he was having a sensation of burning from the inside out.  Patient denies any chest pain, shortness of breath, cough, abdominal pain, nausea, vomiting with me.  States she has to be left alone to sleep.   Home Medications Prior to Admission medications   Medication Sig Start Date End Date Taking? Authorizing Provider  albuterol (VENTOLIN HFA) 108 (90 Base) MCG/ACT inhaler Inhale 1-2 puffs into the lungs every 6 (six) hours as needed for wheezing or shortness of breath. 05/19/21   Prosperi, Christian H, PA-C  fluticasone (FLONASE) 50 MCG/ACT nasal spray Place 1 spray into both nostrils daily. 05/19/21   Prosperi, Christian H, PA-C  metFORMIN (GLUCOPHAGE) 500 MG tablet Take 1 tablet (500 mg total) by mouth 2 (two) times daily with a meal. 06/02/21   [name], MD  risperiDONE (RISPERDAL) 2 MG tablet Take 1 tablet (2 mg total) by mouth at bedtime. 04/10/22 10/07/22  Alicia Amel, MD      Allergies    Penicillins and Povidone-iodine    Review of Systems   Review of Systems  Cardiovascular:  Positive for chest pain.    Physical Exam Updated Vital Signs BP (!) 139/97   Pulse 85   Temp (!) 97.5 F (36.4 C)   Resp 18   Ht 5\' 11"  (1.803 m)   Wt 90.7 kg   SpO2 100%   BMI 27.89 kg/m  Physical Exam Vitals and nursing note reviewed. Exam conducted with a chaperone present.  Constitutional:      Appearance: Normal  appearance.     Comments: Disheveled appearance  HENT:     Head: Normocephalic and atraumatic.  Eyes:     General: No scleral icterus.       Right eye: No discharge.        Left eye: No discharge.     Extraocular Movements: Extraocular movements intact.     Pupils: Pupils are equal, round, and reactive to light.  Cardiovascular:     Rate and Rhythm: Normal rate and regular rhythm.     Pulses: Normal pulses.     Heart sounds: Normal heart sounds. No murmur heard.    No friction rub. No gallop.     Comments: Upper and lower extremity pulses 2+ symmetric bilaterally Pulmonary:     Effort: Pulmonary effort is normal. No respiratory distress.     Breath sounds: Normal breath sounds.  Abdominal:     General: Abdomen is flat. Bowel sounds are normal. There is no distension.     Palpations: Abdomen is soft.     Tenderness: There is no abdominal tenderness.  Musculoskeletal:     Right lower leg: No edema.     Left lower leg: No edema.  Skin:    General: Skin is warm and dry.     Coloration: Skin is not jaundiced.  Neurological:     Mental Status: He is alert. Mental status is at  baseline.     Coordination: Coordination normal.     ED Results / Procedures / Treatments   Labs (all labs ordered are listed, but only abnormal results are displayed) Labs Reviewed  CBC WITH DIFFERENTIAL/PLATELET - Abnormal; Notable for the following components:      Result Value   Abs Immature Granulocytes 0.08 (*)    All other components within normal limits  COMPREHENSIVE METABOLIC PANEL - Abnormal; Notable for the following components:   Sodium 131 (*)    Glucose, Bld 306 (*)    Calcium 8.8 (*)    All other components within normal limits  TROPONIN I (HIGH SENSITIVITY)    EKG None  Radiology DG Chest 2 View  Result Date: 04/27/2022 CLINICAL DATA:  Shortness of breath. EXAM: CHEST - 2 VIEW COMPARISON:  April 22, 2022. FINDINGS: The heart size and mediastinal contours are within normal  limits. Both lungs are clear. The visualized skeletal structures are unremarkable. IMPRESSION: No active cardiopulmonary disease. Electronically Signed   By: Lupita Raider M.D.   On: 04/27/2022 08:04    Procedures Procedures    Medications Ordered in ED Medications - No data to display  ED Course/ Medical Decision Making/ A&P Clinical Course as of 04/28/22 0708  Sat Apr 28, 2022  0704 CXR 04/27/22 -   COMPARISON:  April 22, 2022.   FINDINGS: The heart size and mediastinal contours are within normal limits. Both lungs are clear. The visualized skeletal structures are unremarkable.   IMPRESSION: No active cardiopulmonary disease.  [HS]    Clinical Course User Index [HS] Theron Arista, PA-C                           Medical Decision Making Amount and/or Complexity of Data Reviewed Labs: ordered.   Patient presents due to chest pain.  Differential includes but not limited to ACS, PE, pneumonia, metabolic abnormality, dissection, pneumothorax, pericarditis.  Reviewed external medical records including previous ED visit in the last 2 days.  Reviewed chest x-ray obtained yesterday, do not think repeat indicated given it was done within the last 24 hours unremarkable.  I reviewed previous EKGs as well.  Patient has no documented history of previous echocardiograms or cardiac interventions.  I ordered, viewed and interpreted laboratory workup. CBC without leukocytosis or anemia. CMP without gross electrolyte derangement or AKI.  Mildly hypocalcemic with a calcium of 8.8, mildly hyperglycemic with glucose of 306 and no signs of DKA. Troponin flat/low 5--->6 Dimer negative  EKG with T wave inversions in inferior lead III.  No reciprocal ST elevations.  This is unchanged compared to yesterday but new per chart review.  For that reason we will add a second troponin.  I considered hospitalization for observation and additional evaluation.  He does have cardiovascular risk factors  including smoking, hypertension, diabetes putting him at higher risk, his heart score is 4.  However, patient's pain really does not appear cardiac in nature.  There is no exertional component, no nausea or vomiting, no diaphoresis and he has had flat troponins which have been trending for greater than 24 hours.  He has had no ischemic changes on EKG other than T wave inversions in lead III which have been steady for the last 24 hours.  Although my suspicion for ACS is low he does have significant cardiovascular risk factors and has a heart score 4.  I will consult cardiology given he is homeless with limited ability to follow-up  in the outpatient setting to see if admission for trop trending or echocardiogram would be beneficial.   Spoke with Dr. Royann Shivers with cardiology.  Based on history, story and EKG with only lead III inversions he does not think patient would need an inpatient cardiac workup.  Appropriate for outpatient follow-up.  Appreciate his consult.  Feel patient is appropriate for close outpatient follow-up at this time.  Doubt ACS given negative troponin and no specific ischemic changes.  Not consistent with a dissection, pneumonia, AAA, PTX.  Additionally dimer is negative so doubt PE.  Return precautions gust with patient verbalized understanding agreement with the plan.        Final Clinical Impression(s) / ED Diagnoses Final diagnoses:  None    Rx / DC Orders ED Discharge Orders     None         Theron Arista, [place] 04/28/22 1517

## 2022-04-29 ENCOUNTER — Other Ambulatory Visit: Payer: Self-pay

## 2022-04-29 ENCOUNTER — Emergency Department (HOSPITAL_COMMUNITY)
Admission: EM | Admit: 2022-04-29 | Discharge: 2022-04-29 | Disposition: A | Discharge status: Home / Self Care | Payer: PPO | Attending: Emergency Medicine | Admitting: Emergency Medicine

## 2022-04-29 DIAGNOSIS — T730XXA Starvation, initial encounter: Secondary | ICD-10-CM | POA: Diagnosis not present

## 2022-04-29 DIAGNOSIS — Z7984 Long term (current) use of oral hypoglycemic drugs: Secondary | ICD-10-CM | POA: Diagnosis not present

## 2022-04-29 DIAGNOSIS — E119 Type 2 diabetes mellitus without complications: Secondary | ICD-10-CM | POA: Diagnosis not present

## 2022-04-29 DIAGNOSIS — I10 Essential (primary) hypertension: Secondary | ICD-10-CM | POA: Insufficient documentation

## 2022-04-29 NOTE — ED Provider Notes (Signed)
[facility] EMERGENCY DEPARTMENT Provider Note   CSN: [id number] Arrival date & time: 04/29/22  2039     History No chief complaint on file.   Alejandro Baker is a 46 y.o. male with h/o DM, HTN, and schizoaffective disorder presents to the ER. The intake chief complaint mentions feet pain, but the patient repeatedly says "I'm hungry". When asked if he has feet pain he said "I'm hungry". Guarded historian.   HPI     Home Medications Prior to Admission medications   Medication Sig Start Date End Date Taking? Authorizing Provider  albuterol (VENTOLIN HFA) 108 (90 Base) MCG/ACT inhaler Inhale 1-2 puffs into the lungs every 6 (six) hours as needed for wheezing or shortness of breath. 05/19/21   Prosperi, Christian H, PA-C  fluticasone (FLONASE) 50 MCG/ACT nasal spray Place 1 spray into both nostrils daily. 05/19/21   Prosperi, Christian H, PA-C  metFORMIN (GLUCOPHAGE) 500 MG tablet Take 1 tablet (500 mg total) by mouth 2 (two) times daily with a meal. 06/02/21   [name], MD  risperiDONE (RISPERDAL) 2 MG tablet Take 1 tablet (2 mg total) by mouth at bedtime. 04/10/22 10/07/22  Alicia Amel, MD      Allergies    Penicillins and Povidone-iodine    Review of Systems   Review of Systems  SEE HPI  Physical Exam Updated Vital Signs BP (!) 159/94   Pulse (!) 103   Temp 98.4 F (36.9 C) (Oral)   Resp 16   SpO2 98%  Physical Exam Vitals and nursing note reviewed.  Constitutional:      Appearance: Normal appearance.  Eyes:     General: No scleral icterus. Pulmonary:     Effort: Pulmonary effort is normal. No respiratory distress.  Musculoskeletal:        General: No deformity.     Comments: No swelling noted to the lower legs. Patient was uncooperative and did not allow me to look at his feet as he did not want to remove his shoes or socks. Normal gait.  Skin:    General: Skin is dry.     Findings: No rash.  Neurological:     General: No focal  deficit present.     Mental Status: He is alert. Mental status is at baseline.     Gait: Gait normal.     ED Results / Procedures / Treatments   Labs (all labs ordered are listed, but only abnormal results are displayed) Labs Reviewed - No data to display  EKG None  Radiology No results found.  Procedures Procedures   Medications Ordered in ED Medications - No data to display  ED Course/ Medical Decision Making/ A&P                           Medical Decision Making  46 y/o M presents to the ER for evaluation of feet pain? And feeling hungry. Vitals show mild tachycardia, and mild elevated BP, otherwise unremarkable. Physical exam is limited. The patient did not want to remove his shoes or socks to allow me to look at his feet. No swelling noted. Normal gait. He repeatedly kept shaking his head no and saying "I'm hungry" repeatedly. I gave him food which he replied thank you to. My evaluation is limited, patient likely came here for food as he is homeless. He did not appear to be in any acute distress and was otherwise pleasant. Discharged home with food.  Final Clinical Impression(s) / ED Diagnoses Final diagnoses:  Hungry, initial encounter    Rx / DC Orders ED Discharge Orders     None         Sherrell Puller, Hershal Coria 04/29/22 2228    Lajean Saver, MD 04/30/22 [phone]

## 2022-04-29 NOTE — ED Triage Notes (Signed)
Patient reports feet pain today , denies injury , ambulatory , he also reports feeling hungry.

## 2022-04-30 ENCOUNTER — Other Ambulatory Visit: Payer: Self-pay

## 2022-04-30 ENCOUNTER — Emergency Department (HOSPITAL_COMMUNITY)
Admission: EM | Admit: 2022-04-30 | Discharge: 2022-04-30 | Disposition: A | Discharge status: Home / Self Care | Payer: PPO | Attending: Emergency Medicine | Admitting: Emergency Medicine

## 2022-04-30 ENCOUNTER — Emergency Department (HOSPITAL_COMMUNITY)
Admission: EM | Admit: 2022-04-30 | Discharge: 2022-05-01 | Disposition: A | Discharge status: Home / Self Care | Payer: PPO | Source: Home / Self Care | Attending: Emergency Medicine | Admitting: Emergency Medicine

## 2022-04-30 DIAGNOSIS — E119 Type 2 diabetes mellitus without complications: Secondary | ICD-10-CM | POA: Insufficient documentation

## 2022-04-30 DIAGNOSIS — Z1152 Encounter for screening for COVID-19: Secondary | ICD-10-CM | POA: Diagnosis not present

## 2022-04-30 DIAGNOSIS — J3489 Other specified disorders of nose and nasal sinuses: Secondary | ICD-10-CM | POA: Insufficient documentation

## 2022-04-30 DIAGNOSIS — R0981 Nasal congestion: Secondary | ICD-10-CM | POA: Diagnosis not present

## 2022-04-30 DIAGNOSIS — Z7984 Long term (current) use of oral hypoglycemic drugs: Secondary | ICD-10-CM | POA: Insufficient documentation

## 2022-04-30 DIAGNOSIS — I10 Essential (primary) hypertension: Secondary | ICD-10-CM | POA: Insufficient documentation

## 2022-04-30 DIAGNOSIS — Z765 Malingerer [conscious simulation]: Secondary | ICD-10-CM | POA: Insufficient documentation

## 2022-04-30 DIAGNOSIS — M79671 Pain in right foot: Secondary | ICD-10-CM | POA: Insufficient documentation

## 2022-04-30 DIAGNOSIS — Z20822 Contact with and (suspected) exposure to covid-19: Secondary | ICD-10-CM | POA: Insufficient documentation

## 2022-04-30 MED ORDER — IBUPROFEN 400 MG PO TABS: 600.0000 mg | ORAL_TABLET | Freq: Once | ORAL | Status: DC

## 2022-04-30 NOTE — ED Notes (Signed)
Pt called for triage, no response. 

## 2022-04-30 NOTE — ED Triage Notes (Signed)
Patient reports persistent feet pain for several days due to constant walking.

## 2022-04-30 NOTE — ED Provider Notes (Signed)
[facility] EMERGENCY DEPARTMENT Provider Note   CSN: [id number] Arrival date & time: 04/30/22  4287     History  Chief Complaint  Patient presents with   Feet Pain     Alejandro Baker is a 47 y.o. male.  Patient presents to urgency department today for evaluation of right foot pain.  Patient was here in the emergency department and seen at 8 PM last evening.  He was hungry and given food.  Also complained of foot pain at that time, but per provider note, would not allow provider to examine foot.  Patient complains of pain.  Denies reported injury.  It does appear he has a history of diabetes.  He states that he walks on his feet a lot.  He is wearing hospital socks.       Home Medications Prior to Admission medications   Medication Sig Start Date End Date Taking? Authorizing Provider  albuterol (VENTOLIN HFA) 108 (90 Base) MCG/ACT inhaler Inhale 1-2 puffs into the lungs every 6 (six) hours as needed for wheezing or shortness of breath. 05/19/21   Prosperi, Christian H, PA-C  fluticasone (FLONASE) 50 MCG/ACT nasal spray Place 1 spray into both nostrils daily. 05/19/21   Prosperi, Christian H, PA-C  metFORMIN (GLUCOPHAGE) 500 MG tablet Take 1 tablet (500 mg total) by mouth 2 (two) times daily with a meal. 06/02/21   Zachery Dakins, MD  risperiDONE (RISPERDAL) 2 MG tablet Take 1 tablet (2 mg total) by mouth at bedtime. 04/10/22 10/07/22  Eppie Gibson, MD      Allergies    Penicillins and Povidone-iodine    Review of Systems   Review of Systems  Physical Exam Updated Vital Signs BP (!) 158/87   Pulse 73   Temp 98.4 F (36.9 C)   Resp 18   SpO2 99%  Physical Exam Vitals and nursing note reviewed.  Constitutional:      Appearance: He is well-developed.  HENT:     Head: Normocephalic and atraumatic.  Eyes:     Conjunctiva/sclera: Conjunctivae normal.  Cardiovascular:     Pulses: Normal pulses. No decreased pulses.  Musculoskeletal:        General:  Tenderness present.     Cervical back: Normal range of motion and neck supple.     Right lower leg: No edema.     Left lower leg: No edema.     Comments: Left foot: Foot is dirty. No point tenderness.  When asked where the pain is, he points laterally.  I do not see any signs of cellulitis or abscess.  He is able to fully range his foot and ankle actively.  2+ dorsalis pedis pulse.  Skin:    General: Skin is warm and dry.  Neurological:     Mental Status: He is alert.     Sensory: No sensory deficit.     Comments: Motor, sensation, and vascular distal to the injury is fully intact.   Psychiatric:        Mood and Affect: Mood normal.     ED Results / Procedures / Treatments   Labs (all labs ordered are listed, but only abnormal results are displayed) Labs Reviewed - No data to display  EKG None  Radiology No results found.  Procedures Procedures    Medications Ordered in ED Medications  ibuprofen (ADVIL) tablet 600 mg (has no administration in time range)    ED Course/ Medical Decision Making/ A&P    Patient seen and  examined. History obtained directly from patient.   Labs/EKG: None ordered  Imaging: None ordered.  Considered foot x-ray, however no sign of trauma.  Medications/Fluids: Ordered: Ibuprofen   Most recent vital signs reviewed and are as follows: BP (!) 158/87   Pulse 73   Temp 98.4 F (36.9 C)   Resp 18   SpO2 99%   Initial impression: Foot pain  Home treatment plan: Rest, OTC meds  Return instructions discussed with patient: Worsening pain or swelling  Follow-up instructions discussed with patient: Follow-up with PCP as able                          Medical Decision Making  Patient with left foot pain.  Chart shows history of diabetes.  I do not see any evidence of a diabetic foot infection, foreign body or other trauma.  Normal pedal pulse.  Patient is actively moving normally.  Symptomatic care indicated at this  time.         Final Clinical Impression(s) / ED Diagnoses Final diagnoses:  Right foot pain    Rx / DC Orders ED Discharge Orders     None         Carlisle Cater, PA-C 04/30/22 0441    Orpah Greek, MD 05/02/22 [phone]

## 2022-05-01 ENCOUNTER — Encounter (HOSPITAL_COMMUNITY): Payer: Self-pay | Admitting: Emergency Medicine

## 2022-05-01 ENCOUNTER — Other Ambulatory Visit: Payer: Self-pay

## 2022-05-01 LAB — RESP PANEL BY RT-PCR (RSV, FLU A&B, COVID)  RVPGX2
Influenza A by PCR: NEGATIVE
Influenza B by PCR: NEGATIVE
Resp Syncytial Virus by PCR: NEGATIVE
SARS Coronavirus 2 by RT PCR: NEGATIVE

## 2022-05-01 NOTE — ED Provider Notes (Signed)
[facility] EMERGENCY DEPARTMENT Provider Note   CSN: [id number] Arrival date & time: 04/30/22  2335     History  Chief Complaint  Patient presents with   nasal drainage    Alejandro Baker is a 47 y.o. male with homelessness and frequent ED visits Recently who presents for reporting runny nose.  Patient with history of polysubstance use and schizophrenia.  Type 2 diabetes and hypertension.  Patient not allowing much examination, does not want his vitals taken.  Will provide minimal history.  Expresses desire for alcohol and also for something to eat.  HPI     Home Medications Prior to Admission medications   Medication Sig Start Date End Date Taking? Authorizing Provider  albuterol (VENTOLIN HFA) 108 (90 Base) MCG/ACT inhaler Inhale 1-2 puffs into the lungs every 6 (six) hours as needed for wheezing or shortness of breath. 05/19/21   Prosperi, Christian H, PA-C  fluticasone (FLONASE) 50 MCG/ACT nasal spray Place 1 spray into both nostrils daily. 05/19/21   Prosperi, Christian H, PA-C  metFORMIN (GLUCOPHAGE) 500 MG tablet Take 1 tablet (500 mg total) by mouth 2 (two) times daily with a meal. 06/02/21   Zachery Dakins, MD  risperiDONE (RISPERDAL) 2 MG tablet Take 1 tablet (2 mg total) by mouth at bedtime. 04/10/22 10/07/22  Eppie Gibson, MD      Allergies    Penicillins and Povidone-iodine    Review of Systems   Review of Systems  HENT:  Positive for congestion and rhinorrhea.     Physical Exam Updated Vital Signs Temp 99.5 F (37.5 C) (Oral)   Ht 5\' 11"  (1.803 m)   Wt 90.7 kg   BMI 27.89 kg/m  Physical Exam Vitals and nursing note reviewed.  Constitutional:      Appearance: He is not ill-appearing or toxic-appearing.  HENT:     Head: Normocephalic and atraumatic.     Nose: No congestion or rhinorrhea.  Eyes:     General: No scleral icterus.       Right eye: No discharge.        Left eye: No discharge.     Extraocular Movements: Extraocular  movements intact.     Conjunctiva/sclera: Conjunctivae normal.     Pupils: Pupils are equal, round, and reactive to light.  Cardiovascular:     Rate and Rhythm: Normal rate.  Pulmonary:     Effort: Pulmonary effort is normal.  Abdominal:     Palpations: Abdomen is soft.  Skin:    General: Skin is warm and dry.  Neurological:     General: No focal deficit present.     Mental Status: He is alert.  Psychiatric:        Mood and Affect: Mood normal.     ED Results / Procedures / Treatments   Labs (all labs ordered are listed, but only abnormal results are displayed) Labs Reviewed  RESP PANEL BY RT-PCR (RSV, FLU A&B, COVID)  [id number]    EKG None  Radiology No results found.  Procedures Procedures   Medications Ordered in ED Medications - No data to display  ED Course/ Medical Decision Making/ A&P                           Medical Decision Making 47 year old male who presents with concern for runny nose and desire for something to eat.  Afebrile intake, patient would not allow any other vital signs to be taken.  Minimal physical exam performed due to patient's poor compliance.  Clinically well-appearing, requesting COVID test which has been performed.  Clinical picture most consistent with malingering for secondary gain of safe place to stay as well as something to eat.  Patient provided with food.  No evidence of acute medical problem that requires further ED workup or inpatient management at this time.   This chart was dictated using voice recognition software, Dragon. Despite the best efforts of this provider to proofread and correct errors, errors may still occur which can change documentation meaning.  Final Clinical Impression(s) / ED Diagnoses Final diagnoses:  Stuffy and runny nose  Malingering    Rx / DC Orders ED Discharge Orders     None         Aura Dials 05/01/22 0038    Veryl Speak, MD 05/01/22 [phone]

## 2022-05-01 NOTE — ED Triage Notes (Signed)
Pt reports nasal drainage.

## 2022-05-01 NOTE — Discharge Instructions (Addendum)
You were seen in the ER today for your runny nose. Your physical exam was reassuring. You likely have a viral upper respiratory infection. Follow up with the clinic listed below as needed and return to the ER with any new severe symptoms.

## 2022-05-15 ENCOUNTER — Emergency Department (HOSPITAL_COMMUNITY)
Admission: EM | Admit: 2022-05-15 | Discharge: 2022-05-16 | Disposition: A | Discharge status: Home / Self Care | Payer: PPO | Attending: Emergency Medicine | Admitting: Emergency Medicine

## 2022-05-15 DIAGNOSIS — E119 Type 2 diabetes mellitus without complications: Secondary | ICD-10-CM | POA: Insufficient documentation

## 2022-05-15 DIAGNOSIS — S5001XA Contusion of right elbow, initial encounter: Secondary | ICD-10-CM

## 2022-05-15 DIAGNOSIS — W19XXXA Unspecified fall, initial encounter: Secondary | ICD-10-CM | POA: Diagnosis not present

## 2022-05-15 DIAGNOSIS — Z7984 Long term (current) use of oral hypoglycemic drugs: Secondary | ICD-10-CM | POA: Diagnosis not present

## 2022-05-15 DIAGNOSIS — S59901A Unspecified injury of right elbow, initial encounter: Secondary | ICD-10-CM | POA: Diagnosis not present

## 2022-05-15 DIAGNOSIS — M25519 Pain in unspecified shoulder: Secondary | ICD-10-CM | POA: Diagnosis not present

## 2022-05-15 DIAGNOSIS — M25521 Pain in right elbow: Secondary | ICD-10-CM | POA: Diagnosis present

## 2022-05-15 DIAGNOSIS — I10 Essential (primary) hypertension: Secondary | ICD-10-CM | POA: Diagnosis not present

## 2022-05-15 NOTE — ED Triage Notes (Addendum)
Pt bib ems c/o mechanical fall outside. C/o right elbow pain. Denies hitting head. No blood thinners. A&ox4 at this time.

## 2022-05-16 ENCOUNTER — Other Ambulatory Visit: Payer: Self-pay

## 2022-05-16 ENCOUNTER — Emergency Department (HOSPITAL_COMMUNITY): Payer: PPO

## 2022-05-16 DIAGNOSIS — S59901A Unspecified injury of right elbow, initial encounter: Secondary | ICD-10-CM | POA: Diagnosis not present

## 2022-05-16 MED ORDER — IBUPROFEN 400 MG PO TABS
400.0000 mg | ORAL_TABLET | Freq: Once | ORAL | Status: AC
  Administered 2022-05-16: 400 mg via ORAL
  Filled 2022-05-16: qty 1

## 2022-05-16 NOTE — ED Provider Notes (Signed)
[facility] EMERGENCY DEPARTMENT Provider Note   CSN: [id number] Arrival date & time: 05/15/22  2239     History  Chief Complaint  Patient presents with   Alejandro Baker    Alejandro Baker is a 47 y.o. male.  The history is provided by the patient.  Fall  Alejandro Baker is a 47 y.o. male who presents to the Emergency Department complaining of elbow pain.  He presents to the emergency department for evaluation of right elbow pain that occurred after he slipped on ice just prior to ED arrival.  He is right-hand dominant.  No additional injuries.  No head injury.  No chest pain, abdominal pain, vomiting.  Hx/o DM, HTN.      Home Medications Prior to Admission medications   Medication Sig Start Date End Date Taking? Authorizing Provider  albuterol (VENTOLIN HFA) 108 (90 Base) MCG/ACT inhaler Inhale 1-2 puffs into the lungs every 6 (six) hours as needed for wheezing or shortness of breath. 05/19/21   Prosperi, Christian H, PA-C  fluticasone (FLONASE) 50 MCG/ACT nasal spray Place 1 spray into both nostrils daily. 05/19/21   Prosperi, Christian H, PA-C  metFORMIN (GLUCOPHAGE) 500 MG tablet Take 1 tablet (500 mg total) by mouth 2 (two) times daily with a meal. 06/02/21   Zachery Dakins, MD  risperiDONE (RISPERDAL) 2 MG tablet Take 1 tablet (2 mg total) by mouth at bedtime. 04/10/22 10/07/22  Eppie Gibson, MD      Allergies    Penicillins and Povidone-iodine    Review of Systems   Review of Systems  All other systems reviewed and are negative.   Physical Exam Updated Vital Signs BP (!) 144/97   Pulse 87   Temp 98.3 F (36.8 C) (Oral)   Resp 20   Ht 5\' 11"  (1.803 m)   Wt 78 kg   SpO2 100%   BMI 23.99 kg/m  Physical Exam Vitals and nursing note reviewed.  Constitutional:      Appearance: He is well-developed.  HENT:     Head: Normocephalic and atraumatic.  Cardiovascular:     Rate and Rhythm: Normal rate and regular rhythm.  Pulmonary:     Effort: Pulmonary  effort is normal. No respiratory distress.  Musculoskeletal:     Comments: Minimal tenderness to palpation over the right elbow with full range of motion intact at the elbow, shoulder, wrist and hand.  5 out of 5 grip strength in bilateral hands with sensation to light touch intact in the hands bilaterally.  Skin:    General: Skin is warm and dry.  Neurological:     Mental Status: He is alert and oriented to person, place, and time.  Psychiatric:        Behavior: Behavior normal.     ED Results / Procedures / Treatments   Labs (all labs ordered are listed, but only abnormal results are displayed) Labs Reviewed - No data to display  EKG None  Radiology DG Elbow Complete Right  Result Date: 05/16/2022 CLINICAL DATA:  Fall with elbow injury. EXAM: RIGHT ELBOW - COMPLETE 3+ VIEW COMPARISON:  None Available. FINDINGS: There is no evidence of fracture, dislocation, or joint effusion. Mild osteophyte formation at the coronoid process of the ulna. Mild soft tissue swelling is present posterior to the olecranon. IMPRESSION: No acute fracture or dislocation. Electronically Signed   By: Brett Fairy M.D.   On: 05/16/2022 00:41    Procedures Procedures    Medications Ordered in ED Medications  ibuprofen (ADVIL) tablet 400 mg (400 mg Oral Given 05/16/22 7341)    ED Course/ Medical Decision Making/ A&P                             Medical Decision Making Amount and/or Complexity of Data Reviewed Radiology: ordered.  Risk Prescription drug management.   Patient is here for evaluation of right elbow pain following a slip and fall.  No evidence of acute fracture or dislocation on images-imaging personally reviewed and interpreted, agree with radiologist interpretation.  No evidence of additional injuries on evaluation.  Discussed with patient home care for elbow contusion.  Discussed range of motion and activity as tolerated.  Will provide ibuprofen for pain.  Discussed PCP follow-up if  persistent symptoms in 1 week.       Final Clinical Impression(s) / ED Diagnoses Final diagnoses:  Contusion of right elbow, initial encounter    Rx / DC Orders ED Discharge Orders     None         Quintella Reichert, MD 05/16/22 [phone]

## 2022-05-18 ENCOUNTER — Emergency Department (HOSPITAL_COMMUNITY)
Admission: EM | Admit: 2022-05-18 | Discharge: 2022-05-19 | Disposition: A | Discharge status: Home / Self Care | Payer: PPO | Attending: Emergency Medicine | Admitting: Emergency Medicine

## 2022-05-18 ENCOUNTER — Encounter (HOSPITAL_COMMUNITY): Payer: Self-pay | Admitting: Emergency Medicine

## 2022-05-18 DIAGNOSIS — E119 Type 2 diabetes mellitus without complications: Secondary | ICD-10-CM | POA: Diagnosis not present

## 2022-05-18 DIAGNOSIS — Z79899 Other long term (current) drug therapy: Secondary | ICD-10-CM | POA: Diagnosis not present

## 2022-05-18 DIAGNOSIS — R112 Nausea with vomiting, unspecified: Secondary | ICD-10-CM | POA: Diagnosis not present

## 2022-05-18 DIAGNOSIS — Z7984 Long term (current) use of oral hypoglycemic drugs: Secondary | ICD-10-CM | POA: Diagnosis not present

## 2022-05-18 DIAGNOSIS — R1111 Vomiting without nausea: Secondary | ICD-10-CM | POA: Diagnosis not present

## 2022-05-18 DIAGNOSIS — E1165 Type 2 diabetes mellitus with hyperglycemia: Secondary | ICD-10-CM | POA: Diagnosis not present

## 2022-05-18 DIAGNOSIS — R739 Hyperglycemia, unspecified: Secondary | ICD-10-CM | POA: Diagnosis not present

## 2022-05-18 LAB — CBG MONITORING, ED: Glucose-Capillary: 336 mg/dL — ABNORMAL HIGH (ref 70–99)

## 2022-05-18 NOTE — ED Provider Triage Note (Signed)
Emergency Medicine Provider Triage Evaluation Note  Alejandro Baker , a 47 y.o. male  was evaluated in triage.  Pt complains of hyperglycemia, patient homeless at this time, reports that he has not taken his medicine in over a year, reports that he is starting to feel better now that his sugars coming down, denies any nausea, vomiting, abdominal pain..  Review of Systems  Positive: hyperglycemia Negative: Abdominal pain, nausea, vomiting  Physical Exam  BP (!) 143/93 (BP Location: Right Arm)   Pulse 88   Temp 98.5 F (36.9 C) (Oral)   Resp 18   SpO2 98%  Gen:   Awake, no distress   Resp:  Normal effort  MSK:   Moves extremities without difficulty  Other:    Medical Decision Making  Medically screening exam initiated at 7:15 PM.  Appropriate orders placed.  Alejandro Baker was informed that the remainder of the evaluation will be completed by another provider, this initial triage assessment does not replace that evaluation, and the importance of remaining in the ED until their evaluation is complete.  Workup initiated in triage    Alejandro Baker 05/18/22 1916

## 2022-05-18 NOTE — ED Triage Notes (Signed)
Pt here from [employer]  bathroom with c/o n/v and hyperglycemia , cbg 440 , had 200 of fluid and 4 mg of Zofran by ems , has not has his meds in a " while "

## 2022-05-19 LAB — CBG MONITORING, ED: Glucose-Capillary: 173 mg/dL — ABNORMAL HIGH (ref 70–99)

## 2022-05-19 LAB — COMPREHENSIVE METABOLIC PANEL
ALT: 21 U/L (ref 0–44)
AST: 25 U/L (ref 15–41)
Albumin: 3.9 g/dL (ref 3.5–5.0)
Alkaline Phosphatase: 70 U/L (ref 38–126)
Anion gap: 6 (ref 5–15)
BUN: 18 mg/dL (ref 6–20)
CO2: 24 mmol/L (ref 22–32)
Calcium: 8.6 mg/dL — ABNORMAL LOW (ref 8.9–10.3)
Chloride: 105 mmol/L (ref 98–111)
Creatinine, Ser: 0.63 mg/dL (ref 0.61–1.24)
GFR, Estimated: >60 mL/min (ref >60)
Glucose, Bld: 222 mg/dL — ABNORMAL HIGH (ref 70–99)
Potassium: 4.3 mmol/L (ref 3.5–5.1)
Sodium: 135 mmol/L (ref 135–145)
Total Bilirubin: 0.3 mg/dL (ref 0.3–1.2)
Total Protein: 6.8 g/dL (ref 6.5–8.1)

## 2022-05-19 LAB — ETHANOL: Alcohol, Ethyl (B): <10 mg/dL (ref <10)

## 2022-05-19 LAB — CBC WITH DIFFERENTIAL/PLATELET
Abs Immature Granulocytes: 0.03 10*3/uL (ref 0.00–0.07)
Basophils Absolute: 0.1 10*3/uL (ref 0.0–0.1)
Basophils Relative: 1 %
Eosinophils Absolute: 0.2 10*3/uL (ref 0.0–0.5)
Eosinophils Relative: 2 %
HCT: 46.1 % (ref 39.0–52.0)
Hemoglobin: 15.1 g/dL (ref 13.0–17.0)
Immature Granulocytes: 0 %
Lymphocytes Relative: 31 %
Lymphs Abs: 2.4 10*3/uL (ref 0.7–4.0)
MCH: 29.4 pg (ref 26.0–34.0)
MCHC: 32.8 g/dL (ref 30.0–36.0)
MCV: 89.9 fL (ref 80.0–100.0)
Monocytes Absolute: 0.6 10*3/uL (ref 0.1–1.0)
Monocytes Relative: 8 %
Neutro Abs: 4.6 10*3/uL (ref 1.7–7.7)
Neutrophils Relative %: 58 %
Platelets: 242 10*3/uL (ref 150–400)
RBC: 5.13 MIL/uL (ref 4.22–5.81)
RDW: 13.2 % (ref 11.5–15.5)
WBC: 7.8 10*3/uL (ref 4.0–10.5)
nRBC: 0 % (ref 0.0–0.2)

## 2022-05-19 LAB — BLOOD GAS, VENOUS
Acid-Base Excess: 0.3 mmol/L (ref 0.0–2.0)
Bicarbonate: 26.5 mmol/L (ref 20.0–28.0)
O2 Saturation: 92.6 %
Patient temperature: 37
pCO2, Ven: 48 mmHg (ref 44–60)
pH, Ven: 7.35 (ref 7.25–7.43)
pO2, Ven: 62 mmHg — ABNORMAL HIGH (ref 32–45)

## 2022-05-19 LAB — LIPASE, BLOOD: Lipase: 71 U/L — ABNORMAL HIGH (ref 11–51)

## 2022-05-19 MED ORDER — LACTATED RINGERS IV SOLN: INTRAVENOUS | Status: DC

## 2022-05-19 MED ORDER — INSULIN ASPART 100 UNIT/ML IJ SOLN
8.0000 [IU] | Freq: Once | INTRAMUSCULAR | Status: AC
  Administered 2022-05-19: 8 [IU] via SUBCUTANEOUS
  Filled 2022-05-19: qty 0.08

## 2022-05-19 MED ORDER — LACTATED RINGERS IV BOLUS
1000.0000 mL | Freq: Once | INTRAVENOUS | Status: AC
  Administered 2022-05-19: 1000 mL via INTRAVENOUS

## 2022-05-19 NOTE — ED Provider Notes (Signed)
[facility] Provider Note   CSN: [id number] Arrival date & time: 05/18/22  1817     History  Chief Complaint  Patient presents with   Hyperglycemia    Alejandro Baker is a 47 y.o. male.  76 male with history of diabetes presents due to nausea vomiting.  Patient seen in ED multiple times for similar symptoms.  Apparently was found inside of a restaurant in the bathroom with nausea vomiting.  Denies any recent use of alcohol.  No abdominal discomfort.  Does have a history of schizophrenia.  History is limited due to patient's cooperation.       Home Medications Prior to Admission medications   Medication Sig Start Date End Date Taking? Authorizing Provider  albuterol (VENTOLIN HFA) 108 (90 Base) MCG/ACT inhaler Inhale 1-2 puffs into the lungs every 6 (six) hours as needed for wheezing or shortness of breath. 05/19/21   Prosperi, Christian H, PA-C  fluticasone (FLONASE) 50 MCG/ACT nasal spray Place 1 spray into both nostrils daily. 05/19/21   Prosperi, Christian H, PA-C  metFORMIN (GLUCOPHAGE) 500 MG tablet Take 1 tablet (500 mg total) by mouth 2 (two) times daily with a meal. 06/02/21   Zachery Dakins, MD  risperiDONE (RISPERDAL) 2 MG tablet Take 1 tablet (2 mg total) by mouth at bedtime. 04/10/22 10/07/22  Eppie Gibson, MD      Allergies    Penicillins and Povidone-iodine    Review of Systems   Review of Systems  Unable to perform ROS: Acuity of condition    Physical Exam Updated Vital Signs BP (!) 161/97 (BP Location: Right Arm)   Pulse 70   Temp 97.7 F (36.5 C) (Oral)   Resp 20   SpO2 99%  Physical Exam Vitals and nursing note reviewed.  Constitutional:      General: He is not in acute distress.    Appearance: Normal appearance. He is well-developed. He is not toxic-appearing.  HENT:     Head: Normocephalic and atraumatic.  Eyes:     General: Lids are normal.     Conjunctiva/sclera: Conjunctivae normal.      Pupils: Pupils are equal, round, and reactive to light.  Neck:     Thyroid: No thyroid mass.     Trachea: No tracheal deviation.  Cardiovascular:     Rate and Rhythm: Normal rate and regular rhythm.     Heart sounds: Normal heart sounds. No murmur heard.    No gallop.  Pulmonary:     Effort: Pulmonary effort is normal. No respiratory distress.     Breath sounds: Normal breath sounds. No stridor. No decreased breath sounds, wheezing, rhonchi or rales.  Abdominal:     General: There is no distension.     Palpations: Abdomen is soft.     Tenderness: There is no abdominal tenderness. There is no rebound.  Musculoskeletal:        General: No tenderness. Normal range of motion.     Cervical back: Normal range of motion and neck supple.  Skin:    General: Skin is warm and dry.     Findings: No abrasion or rash.  Neurological:     Mental Status: He is oriented to person, place, and time. Mental status is at baseline. He is lethargic.     GCS: GCS eye subscore is 4. GCS verbal subscore is 5. GCS motor subscore is 6.     Cranial Nerves: No cranial nerve deficit.  Sensory: No sensory deficit.     Motor: Motor function is intact.  Psychiatric:        Attention and Perception: He is inattentive.        Mood and Affect: Affect is blunt.        Speech: Speech is delayed.     ED Results / Procedures / Treatments   Labs (all labs ordered are listed, but only abnormal results are displayed) Labs Reviewed  CBG MONITORING, ED - Abnormal; Notable for the following components:      Result Value   Glucose-Capillary 336 (*)    All other components within normal limits  CBC WITH DIFFERENTIAL/PLATELET  BASIC METABOLIC PANEL  URINALYSIS, ROUTINE W REFLEX MICROSCOPIC  BLOOD GAS, VENOUS    EKG None  Radiology No results found.  Procedures Procedures    Medications Ordered in ED Medications  lactated ringers bolus 1,000 mL (has no administration in time range)  lactated ringers  infusion (has no administration in time range)  insulin aspart (novoLOG) injection 8 Units (has no administration in time range)    ED Course/ Medical Decision Making/ A&P                             Medical Decision Making Amount and/or Complexity of Data Reviewed Labs: ordered.  Risk Prescription drug management.   Patient hyperglycemic here but no evidence of DKA.  His anion gap is normal.  Given IV fluids and insulin and blood sugar has improved.  Plan will be for patient be discharged home at this time.  Indication for admission        Final Clinical Impression(s) / ED Diagnoses Final diagnoses:  None    Rx / DC Orders ED Discharge Orders     None         Lacretia Leigh, MD 05/19/22 1025

## 2022-05-19 NOTE — ED Notes (Signed)
Pt asleep by vending machines. This writer attempted to obtain VS. Pt refused, sts "want to sleep".

## 2022-05-22 ENCOUNTER — Emergency Department (HOSPITAL_COMMUNITY): Admission: EM | Admit: 2022-05-22 | Discharge: 2022-05-23 | Discharge status: Against Medical Advice | Payer: PPO

## 2022-05-22 NOTE — ED Notes (Signed)
Pt called for triage x2. No answer  

## 2022-05-23 ENCOUNTER — Emergency Department (HOSPITAL_COMMUNITY)
Admission: EM | Admit: 2022-05-23 | Discharge: 2022-05-23 | Disposition: A | Discharge status: Home / Self Care | Payer: PPO | Source: Home / Self Care | Attending: Emergency Medicine | Admitting: Emergency Medicine

## 2022-05-23 ENCOUNTER — Other Ambulatory Visit: Payer: Self-pay

## 2022-05-23 ENCOUNTER — Emergency Department (HOSPITAL_COMMUNITY)
Admission: EM | Admit: 2022-05-23 | Discharge: 2022-05-23 | Discharge status: Against Medical Advice | Payer: PPO | Attending: Emergency Medicine | Admitting: Emergency Medicine

## 2022-05-23 ENCOUNTER — Emergency Department (HOSPITAL_COMMUNITY): Payer: PPO

## 2022-05-23 DIAGNOSIS — M25561 Pain in right knee: Secondary | ICD-10-CM | POA: Insufficient documentation

## 2022-05-23 DIAGNOSIS — Z7984 Long term (current) use of oral hypoglycemic drugs: Secondary | ICD-10-CM | POA: Insufficient documentation

## 2022-05-23 DIAGNOSIS — I10 Essential (primary) hypertension: Secondary | ICD-10-CM | POA: Insufficient documentation

## 2022-05-23 DIAGNOSIS — S5001XA Contusion of right elbow, initial encounter: Secondary | ICD-10-CM

## 2022-05-23 DIAGNOSIS — Y9301 Activity, walking, marching and hiking: Secondary | ICD-10-CM | POA: Insufficient documentation

## 2022-05-23 DIAGNOSIS — W19XXXA Unspecified fall, initial encounter: Secondary | ICD-10-CM

## 2022-05-23 DIAGNOSIS — Z5321 Procedure and treatment not carried out due to patient leaving prior to being seen by health care provider: Secondary | ICD-10-CM | POA: Diagnosis not present

## 2022-05-23 DIAGNOSIS — R531 Weakness: Secondary | ICD-10-CM | POA: Diagnosis not present

## 2022-05-23 DIAGNOSIS — M545 Low back pain, unspecified: Secondary | ICD-10-CM | POA: Insufficient documentation

## 2022-05-23 DIAGNOSIS — W010XXA Fall on same level from slipping, tripping and stumbling without subsequent striking against object, initial encounter: Secondary | ICD-10-CM | POA: Insufficient documentation

## 2022-05-23 DIAGNOSIS — M25521 Pain in right elbow: Secondary | ICD-10-CM | POA: Insufficient documentation

## 2022-05-23 DIAGNOSIS — Z043 Encounter for examination and observation following other accident: Secondary | ICD-10-CM | POA: Diagnosis not present

## 2022-05-23 DIAGNOSIS — E119 Type 2 diabetes mellitus without complications: Secondary | ICD-10-CM | POA: Insufficient documentation

## 2022-05-23 LAB — CBG MONITORING, ED: Glucose-Capillary: 248 mg/dL — ABNORMAL HIGH (ref 70–99)

## 2022-05-23 MED ORDER — IBUPROFEN 800 MG PO TABS
800.0000 mg | ORAL_TABLET | Freq: Once | ORAL | Status: AC
  Administered 2022-05-23: 800 mg via ORAL
  Filled 2022-05-23: qty 1

## 2022-05-23 NOTE — ED Notes (Signed)
Notified by staff that pt was escorted out by security

## 2022-05-23 NOTE — ED Triage Notes (Signed)
Pt arrives with c/o lower back that has been ongoing for years. Pt ambulatory to triage.

## 2022-05-23 NOTE — ED Provider Notes (Signed)
[place] EMERGENCY DEPARTMENT AT [facility] Provider Note   CSN: [id number] Arrival date & time: 05/23/22  2376     History  Chief Complaint  Patient presents with   Marletta Lor    Alejandro Baker is a 47 y.o. male.  Patient presents with right elbow pain and knee pain after falling yesterday.  States he was walking and stepped in a mud puddle and fell on the ice injuring his right elbow.  Did not hit his head.  Complains of posterior right elbow pain is worse with movement.  Denies head, neck, back, chest or abdominal pain.  No weakness, numbness or tingling.  Injured his right knee as well when he fell.  He is requesting something for pain.  States he has diabetes and hypertension but does not take any medications.  No blood thinner use.  The history is provided by the patient.  Fall Pertinent negatives include no chest pain, no abdominal pain, no headaches and no shortness of breath.       Home Medications Prior to Admission medications   Medication Sig Start Date End Date Taking? Authorizing Provider  albuterol (VENTOLIN HFA) 108 (90 Base) MCG/ACT inhaler Inhale 1-2 puffs into the lungs every 6 (six) hours as needed for wheezing or shortness of breath. 05/19/21   Prosperi, Christian H, PA-C  fluticasone (FLONASE) 50 MCG/ACT nasal spray Place 1 spray into both nostrils daily. 05/19/21   Prosperi, Christian H, PA-C  metFORMIN (GLUCOPHAGE) 500 MG tablet Take 1 tablet (500 mg total) by mouth 2 (two) times daily with a meal. 06/02/21   [name], MD  risperiDONE (RISPERDAL) 2 MG tablet Take 1 tablet (2 mg total) by mouth at bedtime. 04/10/22 10/07/22  Alicia Amel, MD      Allergies    Penicillins and Povidone-iodine    Review of Systems   Review of Systems  Constitutional:  Negative for activity change, appetite change and fever.  HENT:  Negative for congestion and rhinorrhea.   Respiratory:  Negative for chest tightness and shortness of breath.    Cardiovascular:  Negative for chest pain.  Gastrointestinal:  Negative for abdominal pain, nausea and vomiting.  Genitourinary:  Negative for dysuria and hematuria.  Musculoskeletal:  Positive for arthralgias and myalgias.  Skin:  Negative for rash.  Neurological:  Negative for dizziness, weakness and headaches.   all other systems are negative except as noted in the HPI and PMH.    Physical Exam Updated Vital Signs BP (!) 119/93 (BP Location: Left Arm)   Pulse 77   Temp 98 F (36.7 C) (Oral)   Resp 16   SpO2 99%  Physical Exam Vitals and nursing note reviewed.  Constitutional:      General: He is not in acute distress.    Appearance: He is well-developed.  HENT:     Head: Normocephalic and atraumatic.     Mouth/Throat:     Pharynx: No oropharyngeal exudate.  Eyes:     Conjunctiva/sclera: Conjunctivae normal.     Pupils: Pupils are equal, round, and reactive to light.  Neck:     Comments: No meningismus. Cardiovascular:     Rate and Rhythm: Normal rate and regular rhythm.     Heart sounds: Normal heart sounds. No murmur heard. Pulmonary:     Effort: Pulmonary effort is normal. No respiratory distress.     Breath sounds: Normal breath sounds.  Chest:     Chest wall: No tenderness.  Abdominal:  Palpations: Abdomen is soft.     Tenderness: There is no abdominal tenderness. There is no guarding or rebound.  Musculoskeletal:        General: Tenderness present. Normal range of motion.     Cervical back: Normal range of motion and neck supple.     Comments: Right posterior elbow pain.  Flexion and extension are intact.  Pronation supination are intact.  No gross deformity.  Equal radial pulse.  Cardinal hand movements intact.  Right lateral knee pain along the joint line.  Flexion and extension are intact.  No ligament laxity.  Skin:    General: Skin is warm.  Neurological:     Mental Status: He is alert and oriented to person, place, and time.     Cranial Nerves: No  cranial nerve deficit.     Motor: No abnormal muscle tone.     Coordination: Coordination normal.     Comments:  5/5 strength throughout. CN 2-12 intact.Equal grip strength.   Psychiatric:        Behavior: Behavior normal.     ED Results / Procedures / Treatments   Labs (all labs ordered are listed, but only abnormal results are displayed) Labs Reviewed  CBG MONITORING, ED    EKG None  Radiology DG Elbow Complete Right  Result Date: 05/23/2022 CLINICAL DATA:  Right elbow pain after falling yesterday. EXAM: RIGHT ELBOW - COMPLETE 3+ VIEW COMPARISON:  Right elbow radiographs 05/16/2022 FINDINGS: Normal bone mineralization. Minimal degenerative spurring at the volar tip of the coronoid process and the medial aspect of the coronoid process at the articulation with the trochlea. No elbow joint effusion. No acute fracture is seen. No dislocation. Interval decrease in now minimal soft tissue swelling posterior to the olecranon. IMPRESSION: 1. Minimal degenerative spurring. No acute fracture. 2. Interval decrease in now minimal soft tissue swelling posterior to the olecranon. Electronically Signed   By: Yvonne Kendall M.D.   On: 05/23/2022 08:54   DG Knee Complete 4 Views Right  Result Date: 05/23/2022 CLINICAL DATA:  Fall EXAM: RIGHT KNEE - COMPLETE 4 VIEW COMPARISON:  None Available. FINDINGS: Soft tissue calcification adjacent to the distal medial femur may represent chronic tendinitis or posttraumatic finding. No acute fracture, dislocation or subluxation. No definite effusion. Joint spaces are maintained. IMPRESSION: No acute osseous abnormalities. Electronically Signed   By: Sammie Bench M.D.   On: 05/23/2022 08:39    Procedures Procedures    Medications Ordered in ED Medications  ibuprofen (ADVIL) tablet 800 mg (800 mg Oral Given 05/23/22 0809)    ED Course/ Medical Decision Making/ A&P                             Medical Decision Making Amount and/or Complexity of Data  Reviewed Labs: ordered. Decision-making details documented in ED Course. Radiology: ordered and independent interpretation performed. Decision-making details documented in ED Course. ECG/medicine tests: ordered and independent interpretation performed. Decision-making details documented in ED Course.  Risk Prescription drug management.   Fall with right elbow pain.  No head injury.  Neurovascular intact.  Low suspicion for fracture but will check screening x-ray.  Ibuprofen given.  Xrays are negative for fracture. Results are reviewed and interpreted by me.   Patient given anti-inflammatories and supportive care. Low suspicion for occult fracture.  Recommend ice, NSAIDs, elevation, PCP follow-up.  Return precautions discussed.       Final Clinical Impression(s) / ED Diagnoses Final diagnoses:  None    Rx / DC Orders ED Discharge Orders     None         Marcanthony Sleight, Annie Main, MD 05/23/22 [phone]

## 2022-05-23 NOTE — ED Triage Notes (Signed)
Pt arrives via [facility] c/o R elbow pain after falling yesterday. Pt denies head injury/LOC.

## 2022-05-23 NOTE — Discharge Instructions (Signed)
X-rays are negative for fracture.  Use ice, elevation, Tylenol Motrin for pain and follow-up with your doctor.  Return to the ED with new or worsening symptoms.

## 2022-05-24 ENCOUNTER — Other Ambulatory Visit: Payer: Self-pay

## 2022-05-24 ENCOUNTER — Emergency Department (HOSPITAL_COMMUNITY)
Admission: EM | Admit: 2022-05-24 | Discharge: 2022-05-25 | Disposition: A | Discharge status: Home / Self Care | Payer: PPO | Attending: Emergency Medicine | Admitting: Emergency Medicine

## 2022-05-24 DIAGNOSIS — R0981 Nasal congestion: Secondary | ICD-10-CM | POA: Diagnosis not present

## 2022-05-24 DIAGNOSIS — Z1152 Encounter for screening for COVID-19: Secondary | ICD-10-CM | POA: Diagnosis not present

## 2022-05-24 DIAGNOSIS — J3489 Other specified disorders of nose and nasal sinuses: Secondary | ICD-10-CM | POA: Diagnosis not present

## 2022-05-24 DIAGNOSIS — J00 Acute nasopharyngitis [common cold]: Secondary | ICD-10-CM | POA: Diagnosis not present

## 2022-05-24 DIAGNOSIS — Z7984 Long term (current) use of oral hypoglycemic drugs: Secondary | ICD-10-CM | POA: Diagnosis not present

## 2022-05-24 NOTE — ED Notes (Signed)
Pt refused to have his BP taken by this Probation officer.

## 2022-05-24 NOTE — ED Triage Notes (Signed)
P[place] by [place]; theater staff originally called for trespassing. Then patient requested transport to ER. C/o nasal congestion x 2days.   Pt became agitated during transport when asked by medic to remain in the stretcher.   160/96, 110, 97%

## 2022-05-25 LAB — RESP PANEL BY RT-PCR (RSV, FLU A&B, COVID)  RVPGX2
Influenza A by PCR: NEGATIVE
Influenza B by PCR: NEGATIVE
Resp Syncytial Virus by PCR: NEGATIVE
SARS Coronavirus 2 by RT PCR: NEGATIVE

## 2022-05-25 MED ORDER — OXYMETAZOLINE HCL 0.05 % NA SOLN
1.0000 | Freq: Once | NASAL | Status: AC
  Administered 2022-05-25: 1 via NASAL
  Filled 2022-05-25: qty 30

## 2022-05-25 MED ORDER — FLUTICASONE PROPIONATE 50 MCG/ACT NA SUSP
1.0000 | Freq: Every day | NASAL | Status: DC
  Filled 2022-05-25: qty 16

## 2022-05-25 NOTE — Discharge Instructions (Addendum)
You may use the afrin (oxymetazoline) nasal spray - one spray in each nostril for a total of three days.  Stop using after three days.   You may use the flonase (fluticasone) nasal spray one spray in each nostril daily until symptoms are better.    Get rechecked if you have trouble breathing or new concerning symptoms.

## 2022-05-25 NOTE — ED Provider Notes (Signed)
[place] EMERGENCY DEPARTMENT AT [facility] Provider Note   CSN: [id number] Arrival date & time: 05/24/22  2345     History  Chief Complaint  Patient presents with   Nasal Congestion    Alejandro Baker is a 47 y.o. male.  The history is provided by the patient.  Alejandro Baker is a 47 y.o. male who presents to the Emergency Department complaining of nasal congestion.  He presents to the emergency department for evaluation of nasal congestion and difficulty breathing from his nose that started a day and a half ago.  No chest pain, fevers, difficulty breathing, nausea, vomiting.  He denies any medical problems.  On record review he has a history of schizophrenia and substance abuse.  Patient denies taking any drugs today.     Home Medications Prior to Admission medications   Medication Sig Start Date End Date Taking? Authorizing Provider  albuterol (VENTOLIN HFA) 108 (90 Base) MCG/ACT inhaler Inhale 1-2 puffs into the lungs every 6 (six) hours as needed for wheezing or shortness of breath. 05/19/21   Prosperi, Christian H, PA-C  fluticasone (FLONASE) 50 MCG/ACT nasal spray Place 1 spray into both nostrils daily. 05/19/21   Prosperi, Christian H, PA-C  metFORMIN (GLUCOPHAGE) 500 MG tablet Take 1 tablet (500 mg total) by mouth 2 (two) times daily with a meal. 06/02/21   Zachery Dakins, MD  risperiDONE (RISPERDAL) 2 MG tablet Take 1 tablet (2 mg total) by mouth at bedtime. 04/10/22 10/07/22  Eppie Gibson, MD      Allergies    Penicillins and Povidone-iodine    Review of Systems   Review of Systems  All other systems reviewed and are negative.   Physical Exam Updated Vital Signs Pulse (!) 107   Temp 99.9 F (37.7 C) (Oral) Comment: declines all other vitals  Resp 18   Wt 78 kg   SpO2 97%   BMI 23.98 kg/m  Physical Exam Vitals and nursing note reviewed.  Constitutional:      Appearance: He is well-developed.  HENT:     Head: Normocephalic and atraumatic.      Comments: Rhinorrhea, left nare greater than right.  There is boggy nasal mucosa.  TMs clear bilaterally. Cardiovascular:     Rate and Rhythm: Normal rate and regular rhythm.     Heart sounds: No murmur heard. Pulmonary:     Effort: Pulmonary effort is normal. No respiratory distress.     Breath sounds: Normal breath sounds.  Musculoskeletal:        General: No tenderness.  Skin:    General: Skin is warm and dry.  Neurological:     Mental Status: He is alert and oriented to person, place, and time.  Psychiatric:        Behavior: Behavior normal.     ED Results / Procedures / Treatments   Labs (all labs ordered are listed, but only abnormal results are displayed) Labs Reviewed  RESP PANEL BY RT-PCR (RSV, FLU A&B, COVID)  [id number]    EKG None  Radiology DG Elbow Complete Right  Result Date: 05/23/2022 CLINICAL DATA:  Right elbow pain after falling yesterday. EXAM: RIGHT ELBOW - COMPLETE 3+ VIEW COMPARISON:  Right elbow radiographs 05/16/2022 FINDINGS: Normal bone mineralization. Minimal degenerative spurring at the volar tip of the coronoid process and the medial aspect of the coronoid process at the articulation with the trochlea. No elbow joint effusion. No acute fracture is seen. No dislocation. Interval decrease in now minimal soft  tissue swelling posterior to the olecranon. IMPRESSION: 1. Minimal degenerative spurring. No acute fracture. 2. Interval decrease in now minimal soft tissue swelling posterior to the olecranon. Electronically Signed   By: Yvonne Kendall M.D.   On: 05/23/2022 08:54   DG Knee Complete 4 Views Right  Result Date: 05/23/2022 CLINICAL DATA:  Fall EXAM: RIGHT KNEE - COMPLETE 4 VIEW COMPARISON:  None Available. FINDINGS: Soft tissue calcification adjacent to the distal medial femur may represent chronic tendinitis or posttraumatic finding. No acute fracture, dislocation or subluxation. No definite effusion. Joint spaces are maintained. IMPRESSION: No acute  osseous abnormalities. Electronically Signed   By: Sammie Bench M.D.   On: 05/23/2022 08:39    Procedures Procedures    Medications Ordered in ED Medications  fluticasone (FLONASE) 50 MCG/ACT nasal spray 1 spray (has no administration in time range)  oxymetazoline (AFRIN) 0.05 % nasal spray 1 spray (1 spray Each Nare Given 05/25/22 0102)    ED Course/ Medical Decision Making/ A&P                             Medical Decision Making Risk OTC drugs.   Patient brought to the emergency department by EMS for evaluation of nasal congestion for 1-1/2 days.  He does have rhinorrhea, no evidence of serious bacterial infection.  Discussed supportive treatment at home.  Will prescribe Flonase, Afrin as needed.  Discussed discontinuing the Afrin after 3 days.        Final Clinical Impression(s) / ED Diagnoses Final diagnoses:  Nasal congestion    Rx / DC Orders ED Discharge Orders     None         Quintella Reichert, MD 05/25/22 0225

## 2022-05-31 ENCOUNTER — Other Ambulatory Visit: Payer: Self-pay

## 2022-05-31 ENCOUNTER — Emergency Department (HOSPITAL_COMMUNITY)
Admission: EM | Admit: 2022-05-31 | Discharge: 2022-05-31 | Disposition: A | Discharge status: Home / Self Care | Payer: PPO | Attending: Emergency Medicine | Admitting: Emergency Medicine

## 2022-05-31 DIAGNOSIS — Z7984 Long term (current) use of oral hypoglycemic drugs: Secondary | ICD-10-CM | POA: Insufficient documentation

## 2022-05-31 DIAGNOSIS — E119 Type 2 diabetes mellitus without complications: Secondary | ICD-10-CM | POA: Insufficient documentation

## 2022-05-31 DIAGNOSIS — R21 Rash and other nonspecific skin eruption: Secondary | ICD-10-CM | POA: Insufficient documentation

## 2022-05-31 DIAGNOSIS — R739 Hyperglycemia, unspecified: Secondary | ICD-10-CM | POA: Diagnosis not present

## 2022-05-31 DIAGNOSIS — I10 Essential (primary) hypertension: Secondary | ICD-10-CM | POA: Diagnosis not present

## 2022-05-31 DIAGNOSIS — L989 Disorder of the skin and subcutaneous tissue, unspecified: Secondary | ICD-10-CM | POA: Diagnosis present
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
## 2022-05-31 NOTE — ED Notes (Signed)
Went to get pt from lobby for room and had to ask pt multiple times to stand up from lying position to walk to room.

## 2022-06-08 ENCOUNTER — Other Ambulatory Visit: Payer: Self-pay

## 2022-06-08 ENCOUNTER — Emergency Department (HOSPITAL_COMMUNITY)
Admission: EM | Admit: 2022-06-08 | Discharge: 2022-06-08 | Disposition: A | Discharge status: Home / Self Care | Payer: PPO | Attending: Emergency Medicine | Admitting: Emergency Medicine

## 2022-06-08 ENCOUNTER — Encounter (HOSPITAL_COMMUNITY): Payer: Self-pay | Admitting: Emergency Medicine

## 2022-06-08 ENCOUNTER — Emergency Department (HOSPITAL_COMMUNITY): Payer: PPO

## 2022-06-08 DIAGNOSIS — R0781 Pleurodynia: Secondary | ICD-10-CM | POA: Diagnosis not present

## 2022-06-08 DIAGNOSIS — S299XXA Unspecified injury of thorax, initial encounter: Secondary | ICD-10-CM | POA: Diagnosis present

## 2022-06-08 DIAGNOSIS — M25562 Pain in left knee: Secondary | ICD-10-CM

## 2022-06-08 DIAGNOSIS — S20312A Abrasion of left front wall of thorax, initial encounter: Secondary | ICD-10-CM | POA: Diagnosis not present

## 2022-06-08 DIAGNOSIS — R739 Hyperglycemia, unspecified: Secondary | ICD-10-CM | POA: Diagnosis not present

## 2022-06-08 DIAGNOSIS — Z7984 Long term (current) use of oral hypoglycemic drugs: Secondary | ICD-10-CM | POA: Insufficient documentation

## 2022-06-08 DIAGNOSIS — W19XXXA Unspecified fall, initial encounter: Secondary | ICD-10-CM | POA: Diagnosis not present

## 2022-06-08 DIAGNOSIS — R1084 Generalized abdominal pain: Secondary | ICD-10-CM | POA: Diagnosis not present

## 2022-06-08 NOTE — ED Triage Notes (Addendum)
Patient BIB EMS c/o left rib cage and left knee pain. No obvious deformity or Injury. Denies fall. Pt stated He drink alcohol but not much.

## 2022-06-08 NOTE — Discharge Instructions (Signed)
Tylenol or motrin as needed for pain. Be careful when drinking alcohol. Return here for new concerns.
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
## 2022-06-12 ENCOUNTER — Encounter (HOSPITAL_COMMUNITY): Payer: Self-pay

## 2022-06-12 ENCOUNTER — Emergency Department (HOSPITAL_COMMUNITY)
Admission: EM | Admit: 2022-06-12 | Discharge: 2022-06-12 | Disposition: A | Discharge status: Home / Self Care | Payer: PPO | Attending: Emergency Medicine | Admitting: Emergency Medicine

## 2022-06-12 ENCOUNTER — Other Ambulatory Visit: Payer: Self-pay

## 2022-06-12 DIAGNOSIS — Z7951 Long term (current) use of inhaled steroids: Secondary | ICD-10-CM | POA: Diagnosis not present

## 2022-06-12 DIAGNOSIS — E119 Type 2 diabetes mellitus without complications: Secondary | ICD-10-CM | POA: Diagnosis not present

## 2022-06-12 DIAGNOSIS — I10 Essential (primary) hypertension: Secondary | ICD-10-CM | POA: Insufficient documentation

## 2022-06-12 DIAGNOSIS — R0602 Shortness of breath: Secondary | ICD-10-CM | POA: Diagnosis not present

## 2022-06-12 DIAGNOSIS — J4520 Mild intermittent asthma, uncomplicated: Secondary | ICD-10-CM | POA: Diagnosis not present

## 2022-06-12 DIAGNOSIS — Z7984 Long term (current) use of oral hypoglycemic drugs: Secondary | ICD-10-CM | POA: Diagnosis not present

## 2022-06-12 MED ORDER — ALBUTEROL SULFATE HFA 108 (90 BASE) MCG/ACT IN AERS
2.0000 | INHALATION_SPRAY | Freq: Once | RESPIRATORY_TRACT | Status: AC
  Administered 2022-06-12: 2 via RESPIRATORY_TRACT
  Filled 2022-06-12: qty 6.7

## 2022-06-12 NOTE — ED Notes (Signed)
Pt d/c with instructions. NAD. Stable condition. All questions answered.
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
## 2022-06-12 NOTE — ED Notes (Signed)
BIBA, patient aaox4, states walked into hotel, told staff he felt SOB. Upon assessment, patient breathing regular, non labored. Speaking in full sentences, NAD noted. MAE. Denies any cp, dizziness or any other complaints at this time.

## 2022-06-24 ENCOUNTER — Emergency Department (HOSPITAL_COMMUNITY)
Admission: EM | Admit: 2022-06-24 | Discharge: 2022-06-24 | Disposition: A | Discharge status: Home / Self Care | Payer: PPO | Attending: Emergency Medicine | Admitting: Emergency Medicine

## 2022-06-24 ENCOUNTER — Encounter (HOSPITAL_COMMUNITY): Payer: Self-pay

## 2022-06-24 ENCOUNTER — Emergency Department (HOSPITAL_COMMUNITY): Payer: PPO

## 2022-06-24 DIAGNOSIS — R350 Frequency of micturition: Secondary | ICD-10-CM | POA: Diagnosis not present

## 2022-06-24 DIAGNOSIS — Z7951 Long term (current) use of inhaled steroids: Secondary | ICD-10-CM | POA: Diagnosis not present

## 2022-06-24 DIAGNOSIS — Z91148 Patient's other noncompliance with medication regimen for other reason: Secondary | ICD-10-CM | POA: Diagnosis not present

## 2022-06-24 DIAGNOSIS — I10 Essential (primary) hypertension: Secondary | ICD-10-CM | POA: Diagnosis not present

## 2022-06-24 DIAGNOSIS — Z8616 Personal history of COVID-19: Secondary | ICD-10-CM | POA: Diagnosis not present

## 2022-06-24 DIAGNOSIS — R109 Unspecified abdominal pain: Secondary | ICD-10-CM | POA: Insufficient documentation

## 2022-06-24 DIAGNOSIS — R112 Nausea with vomiting, unspecified: Secondary | ICD-10-CM | POA: Diagnosis not present

## 2022-06-24 DIAGNOSIS — Z7984 Long term (current) use of oral hypoglycemic drugs: Secondary | ICD-10-CM | POA: Insufficient documentation

## 2022-06-24 DIAGNOSIS — R739 Hyperglycemia, unspecified: Secondary | ICD-10-CM | POA: Diagnosis not present

## 2022-06-24 DIAGNOSIS — Z1152 Encounter for screening for COVID-19: Secondary | ICD-10-CM | POA: Insufficient documentation

## 2022-06-24 DIAGNOSIS — J441 Chronic obstructive pulmonary disease with (acute) exacerbation: Secondary | ICD-10-CM | POA: Diagnosis not present

## 2022-06-24 DIAGNOSIS — E1165 Type 2 diabetes mellitus with hyperglycemia: Secondary | ICD-10-CM | POA: Diagnosis not present

## 2022-06-24 DIAGNOSIS — R0602 Shortness of breath: Secondary | ICD-10-CM | POA: Diagnosis not present

## 2022-06-24 DIAGNOSIS — F1090 Alcohol use, unspecified, uncomplicated: Secondary | ICD-10-CM | POA: Diagnosis not present

## 2022-06-24 LAB — COMPREHENSIVE METABOLIC PANEL
ALT: 21 U/L (ref 0–44)
AST: 25 U/L (ref 15–41)
Albumin: 3.4 g/dL — ABNORMAL LOW (ref 3.5–5.0)
Alkaline Phosphatase: 71 U/L (ref 38–126)
Anion gap: 11 (ref 5–15)
BUN: 12 mg/dL (ref 6–20)
CO2: 25 mmol/L (ref 22–32)
Calcium: 8.9 mg/dL (ref 8.9–10.3)
Chloride: 97 mmol/L — ABNORMAL LOW (ref 98–111)
Creatinine, Ser: 1.02 mg/dL (ref 0.61–1.24)
GFR, Estimated: >60 mL/min (ref >60)
Glucose, Bld: 440 mg/dL — ABNORMAL HIGH (ref 70–99)
Potassium: 3.9 mmol/L (ref 3.5–5.1)
Sodium: 133 mmol/L — ABNORMAL LOW (ref 135–145)
Total Bilirubin: 0.4 mg/dL (ref 0.3–1.2)
Total Protein: 6 g/dL — ABNORMAL LOW (ref 6.5–8.1)

## 2022-06-24 LAB — RESP PANEL BY RT-PCR (RSV, FLU A&B, COVID)  RVPGX2
Influenza A by PCR: NEGATIVE
Influenza B by PCR: NEGATIVE
Resp Syncytial Virus by PCR: NEGATIVE
SARS Coronavirus 2 by RT PCR: NEGATIVE

## 2022-06-24 LAB — CBC WITH DIFFERENTIAL/PLATELET
Abs Immature Granulocytes: 0.04 10*3/uL (ref 0.00–0.07)
Basophils Absolute: 0.1 10*3/uL (ref 0.0–0.1)
Basophils Relative: 1 %
Eosinophils Absolute: 0.2 10*3/uL (ref 0.0–0.5)
Eosinophils Relative: 3 %
HCT: 40.8 % (ref 39.0–52.0)
Hemoglobin: 13.9 g/dL (ref 13.0–17.0)
Immature Granulocytes: 1 %
Lymphocytes Relative: 36 %
Lymphs Abs: 2.4 10*3/uL (ref 0.7–4.0)
MCH: 30.1 pg (ref 26.0–34.0)
MCHC: 34.1 g/dL (ref 30.0–36.0)
MCV: 88.3 fL (ref 80.0–100.0)
Monocytes Absolute: 0.6 10*3/uL (ref 0.1–1.0)
Monocytes Relative: 9 %
Neutro Abs: 3.4 10*3/uL (ref 1.7–7.7)
Neutrophils Relative %: 50 %
Platelets: 258 10*3/uL (ref 150–400)
RBC: 4.62 MIL/uL (ref 4.22–5.81)
RDW: 13.3 % (ref 11.5–15.5)
WBC: 6.7 10*3/uL (ref 4.0–10.5)
nRBC: 0 % (ref 0.0–0.2)

## 2022-06-24 LAB — CBG MONITORING, ED
Glucose-Capillary: 515 mg/dL (ref 70–99)
Glucose-Capillary: 345 mg/dL — ABNORMAL HIGH (ref 70–99)

## 2022-06-24 LAB — TROPONIN I (HIGH SENSITIVITY)
Troponin I (High Sensitivity): 5 ng/L (ref <18)
Troponin I (High Sensitivity): 5 ng/L (ref <18)

## 2022-06-24 LAB — ETHANOL: Alcohol, Ethyl (B): <10 mg/dL (ref <10)

## 2022-06-24 MED ORDER — ALBUTEROL SULFATE HFA 108 (90 BASE) MCG/ACT IN AERS
2.0000 | INHALATION_SPRAY | Freq: Once | RESPIRATORY_TRACT | Status: AC
  Administered 2022-06-24: 2 via RESPIRATORY_TRACT
  Filled 2022-06-24: qty 6.7

## 2022-06-24 MED ORDER — DEXAMETHASONE SODIUM PHOSPHATE 10 MG/ML IJ SOLN
10.0000 mg | Freq: Once | INTRAMUSCULAR | Status: AC
  Administered 2022-06-24: 10 mg via INTRAVENOUS
  Filled 2022-06-24: qty 1

## 2022-06-24 MED ORDER — SODIUM CHLORIDE 0.9 % IV BOLUS
1000.0000 mL | Freq: Once | INTRAVENOUS | Status: AC
  Administered 2022-06-24: 1000 mL via INTRAVENOUS

## 2022-06-24 NOTE — Discharge Instructions (Signed)
You were seen today for shortness of breath.  This is likely related to your COPD.  Your blood sugar was also elevated.  Make sure that you are taking her metformin.

## 2022-06-24 NOTE — ED Notes (Signed)
AVS reviewed with pt prior to discharge. Pt verbalizes understanding. Belongings with pt upon depart. Pt taken to lobby to wait for ride.

## 2022-06-24 NOTE — ED Triage Notes (Signed)
Pt is having complaints of abd, increased thirst, frequent urination, and N/V for the last couple days. Unsure if he is diabetic, pt is giving conflicting answers. Medic reports ETOH on board.   Medic CBG 489 100 hr 120/70 bp 100% ra 56m fluid given 18g left AC

## 2022-06-24 NOTE — ED Provider Notes (Signed)
[facility] Provider Note   CSN: [id number] Arrival date & time: 06/24/22  [id number]     History  Chief Complaint  Patient presents with   Hyperglycemia    Dylanjames Huisman is a 47 y.o. male.  HPI     This is a 47 year old male who presents with concerns for shortness of breath.  Patient reports that he has had shortness of breath for the last 2 days.  No fevers or cough.  Denies chest pain.  He also reported multiple other complaints to nursing including abdominal pain, increased thirst, frequent urination, nausea, vomiting.  He is diabetic but has not been taking his metformin.  Endorses alcohol use tonight.   Home Medications Prior to Admission medications   Medication Sig Start Date End Date Taking? Authorizing Provider  albuterol (VENTOLIN HFA) 108 (90 Base) MCG/ACT inhaler Inhale 1-2 puffs into the lungs every 6 (six) hours as needed for wheezing or shortness of breath. 05/19/21   Prosperi, Christian H, PA-C  fluticasone (FLONASE) 50 MCG/ACT nasal spray Place 1 spray into both nostrils daily. 05/19/21   Prosperi, Christian H, PA-C  metFORMIN (GLUCOPHAGE) 500 MG tablet Take 1 tablet (500 mg total) by mouth 2 (two) times daily with a meal. 06/02/21   Zachery Dakins, MD  risperiDONE (RISPERDAL) 2 MG tablet Take 1 tablet (2 mg total) by mouth at bedtime. 04/10/22 10/07/22  Eppie Gibson, MD      Allergies    Penicillins and Povidone-iodine    Review of Systems   Review of Systems  Constitutional:  Negative for fever.  Respiratory:  Positive for shortness of breath.   Cardiovascular:  Negative for chest pain.  Gastrointestinal:  Positive for abdominal pain.  Endocrine: Positive for polydipsia and polyuria.  All other systems reviewed and are negative.   Physical Exam Updated Vital Signs BP (!) 157/84   Pulse 95   Temp 98.7 F (37.1 C) (Oral)   Resp (!) 23   Ht 1.803 m ('5\' 11"'$ )   Wt 78 kg   SpO2 92%   BMI 23.98 kg/m   Physical Exam Vitals and nursing note reviewed.  Constitutional:      Appearance: He is well-developed.     Comments: Disheveled, chronically ill-appearing, no acute distress  HENT:     Head: Normocephalic and atraumatic.     Mouth/Throat:     Mouth: Mucous membranes are moist.  Eyes:     Pupils: Pupils are equal, round, and reactive to light.  Cardiovascular:     Rate and Rhythm: Normal rate and regular rhythm.     Heart sounds: Normal heart sounds. No murmur heard. Pulmonary:     Effort: Pulmonary effort is normal. No respiratory distress.     Breath sounds: Wheezing present.  Abdominal:     Palpations: Abdomen is soft.     Tenderness: There is no abdominal tenderness. There is no rebound.  Musculoskeletal:     Cervical back: Neck supple.     Right lower leg: No edema.     Left lower leg: No edema.  Lymphadenopathy:     Cervical: No cervical adenopathy.  Skin:    General: Skin is warm and dry.  Neurological:     Mental Status: He is alert and oriented to person, place, and time.     Comments: Appears intoxicated  Psychiatric:        Mood and Affect: Mood normal.     ED Results / Procedures /  Treatments   Labs (all labs ordered are listed, but only abnormal results are displayed) Labs Reviewed  COMPREHENSIVE METABOLIC PANEL - Abnormal; Notable for the following components:      Result Value   Sodium 133 (*)    Chloride 97 (*)    Glucose, Bld 440 (*)    Total Protein 6.0 (*)    Albumin 3.4 (*)    All other components within normal limits  CBG MONITORING, ED - Abnormal; Notable for the following components:   Glucose-Capillary 515 (*)    All other components within normal limits  CBG MONITORING, ED - Abnormal; Notable for the following components:   Glucose-Capillary 345 (*)    All other components within normal limits  RESP PANEL BY RT-PCR (RSV, FLU A&B, COVID)  [id number]  CBC WITH DIFFERENTIAL/PLATELET  ETHANOL  TROPONIN I (HIGH SENSITIVITY)  TROPONIN I (HIGH  SENSITIVITY)    EKG EKG Interpretation  Date/Time:  Sunday June 24 2022 05:08:50 EST Ventricular Rate:  95 PR Interval:  223 QRS Duration: 102 QT Interval:  345 QTC Calculation: 434 R Axis:   -24 Text Interpretation: Sinus rhythm Prolonged PR interval Borderline left axis deviation Confirmed by Thayer Jew [phone]) on 06/24/2022 7:32:18 AM  Radiology DG Chest Portable 1 View  Result Date: 06/24/2022 CLINICAL DATA:  47 year old male with shortness of breath. EXAM: PORTABLE CHEST 1 VIEW COMPARISON:  Chest radiographs 06/08/2022 and earlier. FINDINGS: Portable AP semi upright views at 0428 hours. Stable somewhat low lung volumes. Normal cardiac size and mediastinal contours. Visualized tracheal air column is within normal limits. Allowing for portable technique the lungs are clear. No pneumothorax or pleural effusion. No osseous abnormality identified. IMPRESSION: Negative portable chest. Electronically Signed   By: Genevie Ann M.D.   On: 06/24/2022 04:51    Procedures .Critical Care  Performed by: Merryl Hacker, MD Authorized by: Merryl Hacker, MD   Critical care provider statement:    Critical care time (minutes):  35   Critical care was necessary to treat or prevent imminent or life-threatening deterioration of the following conditions:  Respiratory failure and metabolic crisis   Critical care was time spent personally by me on the following activities:  Development of treatment plan with patient or surrogate, discussions with consultants, evaluation of patient's response to treatment, examination of patient, ordering and review of laboratory studies, ordering and review of radiographic studies, ordering and performing treatments and interventions, pulse oximetry, re-evaluation of patient's condition and review of old charts     Medications Ordered in ED Medications  sodium chloride 0.9 % bolus 1,000 mL (0 mLs Intravenous Stopped 06/24/22 0641)  albuterol (VENTOLIN HFA)  108 (90 Base) MCG/ACT inhaler 2 puff (2 puffs Inhalation Given 06/24/22 0728)  dexamethasone (DECADRON) injection 10 mg (10 mg Intravenous Given 06/24/22 [id number])    ED Course/ Medical Decision Making/ A&P                             Medical Decision Making Amount and/or Complexity of Data Reviewed Labs: ordered. Radiology: ordered.  Risk Prescription drug management.   This patient presents to the ED for concern of shortness of breath, hyperglycemia, this involves an extensive number of treatment options, and is a complaint that carries with it a high risk of complications and morbidity.  I considered the following differential and admission for this acute, potentially life threatening condition.  The differential diagnosis includes pneumonia, pneumothorax, COPD exacerbation, less likely  ACS or PE  MDM:    This is a 47 year old male who presents with reported shortness of breath.  He is nontoxic but chronically ill-appearing.  Vital signs notable for blood pressure of 157/84.  He is in no respiratory distress.  He does have some wheezing on exam.  He is afebrile.  Chest x-ray without evidence of pneumothorax or pneumonia.  Troponin x 2 negative, neg COVID/flu.  Patiently was notably hypertensive.  No evidence of anion gap.  Reports that he is not compliant with his metformin.  Given a liter of fluids with downtrending CBG.  Highly suspect given his smoking history that this is likely COPD.  He was given an inhaler and a dose of Decadron.  He was advised that he needs to take his metformin as this will affect his blood sugars.  On recheck, patient states he feels better.  He remains hemodynamically stable and without respiratory distress.  (Labs, imaging, consults)  Labs: I Ordered, and personally interpreted labs.  The pertinent results include: CBC, BMP, troponin x 2, COVID, influenza  Imaging Studies ordered: I ordered imaging studies including chest x-ray I independently visualized and  interpreted imaging. I agree with the radiologist interpretation  Additional history obtained from chart review.  External records from outside source obtained and reviewed including prior evaluation  Cardiac Monitoring: The patient was maintained on a cardiac monitor.  If on the cardiac monitor, I personally viewed and interpreted the cardiac monitored which showed an underlying rhythm of: Sinus rhythm  Reevaluation: After the interventions noted above, I reevaluated the patient and found that they have :improved  Social Determinants of Health:  lives independently  Disposition: Discharge  Co morbidities that complicate the patient evaluation  Past Medical History:  Diagnosis Date   Diabetes mellitus without complication ([place])    Hypertension    Neuropathy    Schizoaffective disorder, bipolar type ([place])      Medicines Meds ordered this encounter  Medications   sodium chloride 0.9 % bolus 1,000 mL   albuterol (VENTOLIN HFA) 108 (90 Base) MCG/ACT inhaler 2 puff   dexamethasone (DECADRON) injection 10 mg    I have reviewed the patients home medicines and have made adjustments as needed  Problem List / ED Course: Problem List Items Addressed This Visit   None Visit Diagnoses     COPD exacerbation ([place])    -  Primary   Relevant Medications   albuterol (VENTOLIN HFA) 108 (90 Base) MCG/ACT inhaler 2 puff (Completed)   dexamethasone (DECADRON) injection 10 mg (Completed)   Hyperglycemia                       Final Clinical Impression(s) / ED Diagnoses Final diagnoses:  COPD exacerbation ([place])  Hyperglycemia    Rx / DC Orders ED Discharge Orders     None         Merryl Hacker, MD 06/24/22 2332

## 2022-06-25 ENCOUNTER — Other Ambulatory Visit: Payer: Self-pay

## 2022-06-25 ENCOUNTER — Emergency Department (HOSPITAL_COMMUNITY)
Admission: EM | Admit: 2022-06-25 | Discharge: 2022-06-25 | Disposition: A | Discharge status: Home / Self Care | Payer: PPO | Attending: Emergency Medicine | Admitting: Emergency Medicine

## 2022-06-25 ENCOUNTER — Encounter (HOSPITAL_COMMUNITY): Payer: Self-pay | Admitting: Emergency Medicine

## 2022-06-25 DIAGNOSIS — R0981 Nasal congestion: Secondary | ICD-10-CM | POA: Diagnosis not present

## 2022-06-25 DIAGNOSIS — Z59819 Housing instability, housed unspecified: Secondary | ICD-10-CM | POA: Diagnosis not present

## 2022-06-25 DIAGNOSIS — J449 Chronic obstructive pulmonary disease, unspecified: Secondary | ICD-10-CM | POA: Diagnosis not present

## 2022-06-25 DIAGNOSIS — Z7984 Long term (current) use of oral hypoglycemic drugs: Secondary | ICD-10-CM | POA: Diagnosis not present

## 2022-06-25 DIAGNOSIS — Z7951 Long term (current) use of inhaled steroids: Secondary | ICD-10-CM | POA: Insufficient documentation

## 2022-06-25 DIAGNOSIS — E1140 Type 2 diabetes mellitus with diabetic neuropathy, unspecified: Secondary | ICD-10-CM | POA: Diagnosis not present

## 2022-06-25 DIAGNOSIS — I10 Essential (primary) hypertension: Secondary | ICD-10-CM | POA: Insufficient documentation

## 2022-06-25 NOTE — Discharge Instructions (Signed)
You were seen today for congestion.  Continue your albuterol inhaler.  See resources provided.

## 2022-06-25 NOTE — ED Triage Notes (Signed)
Pt here with c/o nasal congestion  , pt was seen last night for similar ,

## 2022-06-25 NOTE — ED Notes (Signed)
Patient ambulatory to hall bed with no difficulty. Pt reports he is having a stuffy nose making it hard to breath. Patient was here last night for same complaint. Patient in no visible signs of distress skin pwd rrespirations even and non labored

## 2022-06-25 NOTE — ED Provider Notes (Signed)
[facility] Provider Note   CSN: [id number] Arrival date & time: 06/25/22  0001     History  Chief Complaint  Patient presents with   Nasal Congestion    Pt c/o stuffy nose making it difficult to breath    Alejandro Baker is a 47 y.o. male.  HPI     This is a 47 year old male who presents with nasal congestion.  He was seen and evaluated by myself approximate 24 hours ago for shortness of breath.  When asked what brought him in this evening.  He states "I really just need a bus pass."  When asked if he has a place to live he states "if I get a bus pass, I will."  He reports ongoing nasal congestion.  He has been using his inhaler.  No fevers.  No shortness of breath.  No chest pain.  Home Medications Prior to Admission medications   Medication Sig Start Date End Date Taking? Authorizing Provider  albuterol (VENTOLIN HFA) 108 (90 Base) MCG/ACT inhaler Inhale 1-2 puffs into the lungs every 6 (six) hours as needed for wheezing or shortness of breath. 05/19/21   Prosperi, Christian H, PA-C  fluticasone (FLONASE) 50 MCG/ACT nasal spray Place 1 spray into both nostrils daily. 05/19/21   Prosperi, Christian H, PA-C  metFORMIN (GLUCOPHAGE) 500 MG tablet Take 1 tablet (500 mg total) by mouth 2 (two) times daily with a meal. 06/02/21   Zachery Dakins, MD  risperiDONE (RISPERDAL) 2 MG tablet Take 1 tablet (2 mg total) by mouth at bedtime. 04/10/22 10/07/22  Eppie Gibson, MD      Allergies    Penicillins and Povidone-iodine    Review of Systems   Review of Systems  Constitutional:  Negative for fever.  HENT:  Positive for congestion.   Respiratory:  Negative for shortness of breath.   Cardiovascular:  Negative for chest pain.  All other systems reviewed and are negative.   Physical Exam Updated Vital Signs Ht 1.803 m ('5\' 11"'$ )   Wt 78 kg   BMI 23.98 kg/m  Physical Exam Vitals and nursing note reviewed.  Constitutional:       Appearance: He is well-developed.     Comments: Disheveled appearing but nontoxic, no respiratory distress  HENT:     Head: Normocephalic and atraumatic.     Nose: Congestion present.     Mouth/Throat:     Mouth: Mucous membranes are moist.  Eyes:     Pupils: Pupils are equal, round, and reactive to light.  Cardiovascular:     Rate and Rhythm: Normal rate and regular rhythm.     Heart sounds: Normal heart sounds. No murmur heard. Pulmonary:     Effort: Pulmonary effort is normal. No respiratory distress.     Breath sounds: Normal breath sounds. No wheezing.  Abdominal:     Palpations: Abdomen is soft.     Tenderness: There is no abdominal tenderness. There is no rebound.  Musculoskeletal:     Cervical back: Neck supple.  Lymphadenopathy:     Cervical: No cervical adenopathy.  Skin:    General: Skin is warm and dry.  Neurological:     Mental Status: He is alert and oriented to person, place, and time.  Psychiatric:        Mood and Affect: Mood normal.     ED Results / Procedures / Treatments   Labs (all labs ordered are listed, but only abnormal results are displayed)  Labs Reviewed - No data to display  EKG None  Radiology DG Chest Portable 1 View  Result Date: 06/24/2022 CLINICAL DATA:  47 year old male with shortness of breath. EXAM: PORTABLE CHEST 1 VIEW COMPARISON:  Chest radiographs 06/08/2022 and earlier. FINDINGS: Portable AP semi upright views at 0428 hours. Stable somewhat low lung volumes. Normal cardiac size and mediastinal contours. Visualized tracheal air column is within normal limits. Allowing for portable technique the lungs are clear. No pneumothorax or pleural effusion. No osseous abnormality identified. IMPRESSION: Negative portable chest. Electronically Signed   By: Genevie Ann M.D.   On: 06/24/2022 04:51    Procedures Procedures    Medications Ordered in ED Medications - No data to display  ED Course/ Medical Decision Making/ A&P                              Medical Decision Making  This patient presents to the ED for concern of congestion, this involves an extensive number of treatment options, and is a complaint that carries with it a high risk of complications and morbidity.  I considered the following differential and admission for this acute, potentially life threatening condition.  The differential diagnosis includes URI, ongoing COPD, malingering  MDM:    This is a 46 year old male who presents with initial complaints of congestion.  Was seen by myself less than 24 hours ago for shortness of breath and was treated for mild COPD exacerbation as well as hyperglycemia.  Patient really states that he needs a bus pass.  I suspect that he may be malingering with his presenting symptoms in order to try to get a bus pass.  He has some housing instability.  He has no evidence of distress on exam.  He has no residual wheezing.  He reports he is using his inhaler.  Do not feel he needs further workup at this time.  He was provided with something to eat and drink.  He was also provided with resources for shelters.  (Labs, imaging, consults)  Labs: I Ordered, and personally interpreted labs.  The pertinent results include: None  Imaging Studies ordered: I ordered imaging studies including none I independently visualized and interpreted imaging. I agree with the radiologist interpretation  Additional history obtained from chart review.  External records from outside source obtained and reviewed including prior evaluations  Cardiac Monitoring: The patient was not maintained on a cardiac monitor.  If on the cardiac monitor, I personally viewed and interpreted the cardiac monitored which showed an underlying rhythm of: N/A  Reevaluation: After the interventions noted above, I reevaluated the patient and found that they have :stayed the same  Social Determinants of Health:  housing instability  Disposition: Discharge  Co morbidities that  complicate the patient evaluation  Past Medical History:  Diagnosis Date   Diabetes mellitus without complication ([place])    Hypertension    Neuropathy    Schizoaffective disorder, bipolar type ([place])      Medicines No orders of the defined types were placed in this encounter.   I have reviewed the patients home medicines and have made adjustments as needed  Problem List / ED Course: Problem List Items Addressed This Visit   None Visit Diagnoses     Nasal congestion    -  Primary   Housing instability  Final Clinical Impression(s) / ED Diagnoses Final diagnoses:  Nasal congestion  Housing instability    Rx / DC Orders ED Discharge Orders     None         Collan Schoenfeld, Barbette Hair, MD 06/25/22 [phone]

## 2022-07-04 ENCOUNTER — Ambulatory Visit (HOSPITAL_COMMUNITY)
Admission: EM | Admit: 2022-07-04 | Discharge: 2022-07-04 | Disposition: A | Discharge status: Home / Self Care | Payer: PPO | Attending: Family | Admitting: Family

## 2022-07-04 DIAGNOSIS — F250 Schizoaffective disorder, bipolar type: Secondary | ICD-10-CM | POA: Insufficient documentation

## 2022-07-04 DIAGNOSIS — Z748 Other problems related to care provider dependency: Secondary | ICD-10-CM | POA: Diagnosis not present

## 2022-07-04 DIAGNOSIS — Z5900 Homelessness unspecified: Secondary | ICD-10-CM | POA: Diagnosis not present

## 2022-07-04 NOTE — ED Notes (Signed)
Patient discharged via ambulatory with written and verbal instructions by provider Beatriz Stallion, NP.

## 2022-07-04 NOTE — ED Provider Notes (Signed)
Behavioral Health Urgent Care Medical Screening Exam  Patient Name: Alejandro Baker MRN: [id number] Date of Evaluation: 07/04/22 Chief Complaint:   Diagnosis:  Final diagnoses:  Assistance with transportation  Schizoaffective disorder, bipolar type ([place])    History of Present illness: Alejandro Baker is a 47 y.o. male.  Patient transported by Event organiser, reports "they picked me up walking down the road, I just need a bus pass to get home."    Patient presents voluntarily to [facility] behavioral health for walk-in assessment.  Patient is assessed, face-to-face, by nurse practitioner. He is seated in assessment area, no acute distress. Consulted with provider, Dr. Dwyane Dee, and chart reviewed on 07/04/2022. He  is alert and oriented, pleasant and cooperative during assessment.  Patient with poor dentition and missing teeth, slurred speech at times.  Patient  presents with euthymic mood, congruent affect. He  denies suicidal and homicidal ideations. Denies history of suicide attempts, denies history of non suicidal self-harm behaviors.  Patient easily contracts verbally for safety with this Probation officer.   Per chart review patient diagnosed with schizoaffective disorder, bipolar type and schizophrenia, other diagnoses include substance-induced mood disorder, polysubstance abuse and homelessness.  Alejandro Baker is not linked with outpatient psychiatry currently.  Would like to follow-up with outpatient moving forward.  He states "just give me a referral, I will go."  He denies history of inpatient psychiatric hospitalization.  No family mental health history reported.  Alejandro Baker denies alcohol and substance use currently.  He states "I used alcohol but that has been years ago."    Patient has normal speech and behavior.  He  denies auditory and visual hallucinations.  Patient is able to converse coherently with goal-directed thoughts and no distractibility or preoccupation.  Denies symptoms of paranoia.   Objectively there is no evidence of psychosis/mania or delusional thinking.  Alejandro Baker resides in [place].  He denies access to weapons.  He receives disability income.  He states "I am thinking about going back to work because someone has offered me a job."  Patient endorses average sleep and appetite.  Patient offered support and encouragement.  He reports his primary emotional supports are friends and neighbors.  He does not have a phone number for anyone to call for collateral information at this time.   Patient educated and verbalizes understanding of mental health resources and other crisis services in the community. They are instructed to call 911 and present to the nearest emergency room should patient experience any suicidal/homicidal ideation, auditory/visual/hallucinations, or detrimental worsening of mental health condition.      Flowsheet Row ED from 06/25/2022 in [facility] Emergency Department at [facility] ED from 06/24/2022 in [facility] Emergency Department at [facility] ED from 06/12/2022 in [facility] Emergency Department at [place] No Risk No Risk No Risk       Psychiatric Specialty Exam  Presentation  General Appearance:Appropriate for Environment; Casual  Eye Contact:Fair  Speech:Clear and Coherent; Normal Rate  Speech Volume:Normal  Handedness:Right   Mood and Affect  Mood: Euthymic  Affect: Appropriate; Congruent   Thought Process  Thought Processes: Coherent; Goal Directed; Linear  Descriptions of Associations:Intact  Orientation:Full (Time, Place and Person)  Thought Content:Logical; WDL  Diagnosis of Schizophrenia or Schizoaffective disorder in past: No data recorded Duration of Psychotic Symptoms: No data recorded Hallucinations:None  Ideas of Reference:None  Suicidal Thoughts:No  Homicidal Thoughts:No   Sensorium  Memory: Immediate Fair; Recent  Fair  Judgment: Fair  Insight:  Fair   Community education officer  Concentration: Good  Attention Span: Good  Recall: Alejandro Baker of Knowledge: Fair  Language: Fair   Psychomotor Activity  Psychomotor Activity: Normal   Assets  Assets: Armed forces logistics/support/administrative officer; Housing; Physical Health; Resilience; Social Support   Sleep  Sleep: Good  Number of hours: No data recorded  Physical Exam: Physical Exam Vitals and nursing note reviewed.  Constitutional:      Appearance: Normal appearance. He is well-developed and normal weight.  HENT:     Head: Normocephalic and atraumatic.     Nose: Nose normal.     Mouth/Throat:     Comments: Poor dentition, missing teeth Cardiovascular:     Rate and Rhythm: Normal rate.  Pulmonary:     Effort: Pulmonary effort is normal.  Musculoskeletal:        General: Normal range of motion.     Cervical back: Normal range of motion.  Skin:    General: Skin is warm and dry.  Neurological:     Mental Status: He is alert and oriented to person, place, and time.  Psychiatric:        Attention and Perception: Attention and perception normal.        Mood and Affect: Mood and affect normal.        Speech: Speech normal.        Behavior: Behavior normal. Behavior is cooperative.        Thought Content: Thought content normal.        Cognition and Memory: Cognition and memory normal.    Review of Systems  Constitutional: Negative.   HENT: Negative.    Eyes: Negative.   Respiratory: Negative.    Cardiovascular: Negative.   Gastrointestinal: Negative.   Genitourinary: Negative.   Musculoskeletal: Negative.   Skin: Negative.   Neurological: Negative.   Psychiatric/Behavioral: Negative.     Blood pressure (!) 148/79, pulse 96, temperature 98.7 F (37.1 C), temperature source Oral, resp. rate 18. There is no height or weight on file to calculate BMI.  Musculoskeletal: Strength & Muscle Tone: within normal limits Gait & Station:  normal Patient leans: N/A   [place] [facility]SE Discharge Disposition for Follow up and Recommendations: Based on my evaluation the patient does not appear to have an emergency medical condition and can be discharged with resources and follow up care in outpatient services for Medication Management and Individual Therapy Follow-up with outpatient psychiatry, resources provided.  Lucky Rathke, FNP 07/04/2022, 12:51 PM

## 2022-07-04 NOTE — Progress Notes (Signed)
   07/04/22 1243  [place] (Walk-ins at [facility] only)  What Is the Reason for Your Visit/Call Today? Pt is a 47 yo male who presented voluntarily and unaccompanied brought in by the [facility] Team in connection with [employer]. Pt denied SI, HI, NSSH, AVH, paranoia and current substance use. Pt reported occasional use of alcohol when he can afford it. Pt stated he wants his medication. Pt is asking for medications for psychiatric dxs (schizophrenia).  How Long Has This Been Causing You Problems? > than 6 months  Have You Recently Had Any Thoughts About Hurting Yourself? No  Are You Planning to Commit Suicide/Harm Yourself At This time? No  Have you Recently Had Thoughts About [place]? No  Are You Planning To Harm Someone At This Time? No  Are you currently experiencing any auditory, visual or other hallucinations? No  Have You Used Any Alcohol or Drugs in the Past 24 Hours? No  Do you have any current medical co-morbidities that require immediate attention? No  Clinician description of patient physical appearance/behavior: Pt was alert and seemed oriented. Pt seemed to mumbleing at times and explained that his teeth ewere not fitted well. He spoke softly at a low volume. Once pt appeared to possibly be responding to internal [place] and seemed to be looking and talking to or about someone in the vacant corner of the room. Pt's judgment and insight seemed fair.  What Do You Feel Would Help You the Most Today? Treatment for Depression or other mood problem  If access to [facility] was not available, would you have sought care in the Emergency Department? Yes  Determination of Need Routine (7 days) (Per Beatriz Stallion NP pt is psychicatrically cleared.)  Options For Referral Medication Management;Outpatient Therapy   Britiany Silbernagel T. Mare Ferrari, [place], [facility], [facility] Triage Specialist [facility]

## 2022-07-04 NOTE — Discharge Instructions (Addendum)
Patient is instructed prior to discharge to:  Take all medications as prescribed by his/her mental healthcare provider. Report any adverse effects and or reactions from the medicines to his/her outpatient provider promptly. Keep all scheduled appointments, to ensure that you are getting refills on time and to avoid any interruption in your medication.  If you are unable to keep an appointment call to reschedule.  Be sure to follow-up with resources and follow-up appointments provided.  Patient has been instructed & cautioned: To not engage in alcohol and or illegal drug use while on prescription medicines. In the event of worsening symptoms, patient is instructed to call the crisis hotline, 911 and or go to the nearest ED for appropriate evaluation and treatment of symptoms. To follow-up with his/her primary care provider for your other medical issues, concerns and or health care needs.  Information: -National Suicide Prevention Lifeline 1-800-SUICIDE or 660-126-2001.  -988 offers 24/7 access to trained crisis counselors who can help people experiencing mental health-related distress. People can call or text 988 or chat 988lifeline.org for themselves or if they are worried about a loved one who may need crisis support.     Outpatient Therapy for Medicare Recipients:  [facility] [address] [place], [place], [address] [phone] phone  [facility] [address] [place], [place], [address] [phone] phone ([address], [place], [place], [place], [place], [employer], [place], [place], [place], [place], [place], [place], [place], [place], [place], [employer], [place])  [facility] [address], [place], [place], [address] [phone] phone  [facility] [address], [place], [place], [address] [phone]  phone  [facility] [address] [place], [place], [address] [phone] phone [phone] fax  [facility], [place] [address], [place], [address] [phone] phone  [facility] [address] [place], [place], [address] [phone] phone [phone] fax  [facility] [address] [place], [place], [address] [phone] phone  [facility] [address] [place], [place], [address] [phone] phone  [facility]. [place], [place], [address] [phone] phone [phone] fax

## 2022-07-17 ENCOUNTER — Emergency Department (HOSPITAL_COMMUNITY)
Admission: EM | Admit: 2022-07-17 | Discharge: 2022-07-18 | Disposition: A | Discharge status: Home / Self Care | Payer: PPO | Attending: Emergency Medicine | Admitting: Emergency Medicine

## 2022-07-17 DIAGNOSIS — E119 Type 2 diabetes mellitus without complications: Secondary | ICD-10-CM | POA: Diagnosis not present

## 2022-07-17 DIAGNOSIS — R739 Hyperglycemia, unspecified: Secondary | ICD-10-CM | POA: Diagnosis not present

## 2022-07-17 DIAGNOSIS — I10 Essential (primary) hypertension: Secondary | ICD-10-CM | POA: Diagnosis not present

## 2022-07-17 DIAGNOSIS — M79673 Pain in unspecified foot: Secondary | ICD-10-CM | POA: Diagnosis not present

## 2022-07-17 DIAGNOSIS — Z79899 Other long term (current) drug therapy: Secondary | ICD-10-CM | POA: Insufficient documentation

## 2022-07-17 DIAGNOSIS — Z7984 Long term (current) use of oral hypoglycemic drugs: Secondary | ICD-10-CM | POA: Diagnosis not present

## 2022-07-17 DIAGNOSIS — M79672 Pain in left foot: Secondary | ICD-10-CM | POA: Insufficient documentation

## 2022-07-17 DIAGNOSIS — R202 Paresthesia of skin: Secondary | ICD-10-CM | POA: Insufficient documentation

## 2022-07-17 NOTE — ED Triage Notes (Addendum)
Pt BIB EMS from [facility] with c/o left foot pain from stepping on a nail and has burns on his toes that are old.   Pt is laughing and talking to himself.

## 2022-07-18 ENCOUNTER — Emergency Department (HOSPITAL_COMMUNITY)
Admission: EM | Admit: 2022-07-18 | Discharge: 2022-07-18 | Disposition: A | Discharge status: Home / Self Care | Payer: PPO | Source: Home / Self Care | Attending: Emergency Medicine | Admitting: Emergency Medicine

## 2022-07-18 ENCOUNTER — Encounter (HOSPITAL_COMMUNITY): Payer: Self-pay

## 2022-07-18 ENCOUNTER — Encounter (HOSPITAL_COMMUNITY): Payer: Self-pay | Admitting: Emergency Medicine

## 2022-07-18 ENCOUNTER — Emergency Department (HOSPITAL_COMMUNITY): Payer: PPO

## 2022-07-18 ENCOUNTER — Other Ambulatory Visit: Payer: Self-pay

## 2022-07-18 DIAGNOSIS — R202 Paresthesia of skin: Secondary | ICD-10-CM | POA: Insufficient documentation

## 2022-07-18 DIAGNOSIS — M79672 Pain in left foot: Secondary | ICD-10-CM | POA: Insufficient documentation

## 2022-07-18 LAB — CBG MONITORING, ED: Glucose-Capillary: 231 mg/dL — ABNORMAL HIGH (ref 70–99)

## 2022-07-18 MED ORDER — INDOMETHACIN 50 MG PO CAPS: 50.0000 mg | ORAL_CAPSULE | Freq: Two times a day (BID) | ORAL | 0 refills | Status: AC

## 2022-07-18 MED ORDER — CIPROFLOXACIN HCL 500 MG PO TABS
500.0000 mg | ORAL_TABLET | Freq: Once | ORAL | Status: AC
  Administered 2022-07-18: 500 mg via ORAL
  Filled 2022-07-18: qty 1

## 2022-07-18 MED ORDER — CIPROFLOXACIN HCL 500 MG PO TABS: 500.0000 mg | ORAL_TABLET | Freq: Two times a day (BID) | ORAL | 0 refills | Status: AC

## 2022-07-18 NOTE — Discharge Instructions (Signed)
Please follow-up at the [facility] for additional resources.  Please try to keep your feet clean, dry.  Obtain and use your recently prescribed medications as directed.  Return here for concerning changes in your condition.

## 2022-07-18 NOTE — ED Triage Notes (Signed)
Patient with history of uncontrolled diabetes here with complaint of burning sensation in bilateral feet. Unsure if he's ever been diagnosed with peripheral neuropathy, unsure of when he last checked his blood glucose, states he last took his metformin sometime last year. Patient is alert, oriented, ambulating independently with steady gait, and is in no apparent distress at this time.

## 2022-07-18 NOTE — ED Notes (Signed)
Patient given water per request.

## 2022-07-18 NOTE — ED Provider Notes (Signed)
[facility] Provider Note   CSN: [id number] Arrival date & time: 07/18/22  [id number]     History  Chief Complaint  Patient presents with   Foot Pain    Alejandro Baker is a 47 y.o. male.  HPI Patient presents for the second time in 8 hours with concern for ongoing foot discomfort.  He notes a history of ongoing foot numbness and tingling, and stepped on a nail earlier.  That specific event as documented in notes from earlier today.  He notes no trauma fall or notable events since that time, complains of bilateral foot tingling that is ongoing.    Home Medications Prior to Admission medications   Medication Sig Start Date End Date Taking? Authorizing Provider  albuterol (VENTOLIN HFA) 108 (90 Base) MCG/ACT inhaler Inhale 1-2 puffs into the lungs every 6 (six) hours as needed for wheezing or shortness of breath. 05/19/21   Prosperi, Christian H, PA-C  ciprofloxacin (CIPRO) 500 MG tablet Take 1 tablet (500 mg total) by mouth every 12 (twelve) hours for 7 days. 07/18/22 07/25/22  Tacy Learn, PA-C  fluticasone (FLONASE) 50 MCG/ACT nasal spray Place 1 spray into both nostrils daily. 05/19/21   Prosperi, Christian H, PA-C  indomethacin (INDOCIN) 50 MG capsule Take 1 capsule (50 mg total) by mouth 2 (two) times daily with a meal for 10 days. 07/18/22 07/28/22  Tacy Learn, PA-C  metFORMIN (GLUCOPHAGE) 500 MG tablet Take 1 tablet (500 mg total) by mouth 2 (two) times daily with a meal. 06/02/21   Zachery Dakins, MD  risperiDONE (RISPERDAL) 2 MG tablet Take 1 tablet (2 mg total) by mouth at bedtime. 04/10/22 10/07/22  Eppie Gibson, MD      Allergies    Penicillins and Povidone-iodine    Review of Systems   Review of Systems  All other systems reviewed and are negative.   Physical Exam Updated Vital Signs BP (!) 124/105   Pulse 71   Temp 98.2 F (36.8 C) (Oral)   Resp 18   SpO2 100%  Physical Exam Vitals and nursing note reviewed.   Constitutional:      General: He is not in acute distress.    Appearance: He is well-developed. He is not diaphoretic.     Comments: Slightly disheveled adult male awake and alert  HENT:     Head: Normocephalic and atraumatic.  Cardiovascular:     Pulses: Normal pulses.  Pulmonary:     Effort: Pulmonary effort is normal.  Musculoskeletal:        General: Tenderness present. No swelling, deformity or signs of injury.     Comments: No discernible obvious wound on the plantar surface of either foot  Skin:    General: Skin is warm and dry.  Neurological:     Mental Status: He is alert and oriented to person, place, and time.  Psychiatric:        Behavior: Behavior normal.     ED Results / Procedures / Treatments   Labs (all labs ordered are listed, but only abnormal results are displayed) Labs Reviewed  CBG MONITORING, ED - Abnormal; Notable for the following components:      Result Value   Glucose-Capillary 231 (*)    All other components within normal limits    EKG None  Radiology DG Foot Complete Left  Result Date: 07/18/2022 CLINICAL DATA:  Left foot pain. EXAM: LEFT FOOT - COMPLETE 3+ VIEW COMPARISON:  None Available.  FINDINGS: There is no acute fracture or dislocation. There is erosive changes of the medial head of the first metatarsal concerning for gout. Correlation with clinical exam recommended. The soft tissues are unremarkable. IMPRESSION: 1. No acute fracture or dislocation. 2. Erosive changes of the medial head of the first metatarsal concerning for gout. Electronically Signed   By: Anner Crete M.D.   On: 07/18/2022 00:35    Procedures Procedures    Medications Ordered in ED Medications - No data to display  ED Course/ Medical Decision Making/ A&P                             Medical Decision Making Adult male with schizoaffective disorder, neuropathy presents with ongoing foot tingling.  Some suspicion for acute on chronic neuropathy, though he did  have a puncture wound earlier in the day, well-described by other physicians.  Patient has not yet obtained or began using his medications prescribed at that visit.  No evidence for acute new findings on physical exam he is awake, alert, hemodynamically unremarkable aside from mild hypertension.  Glucose checked, within his normal range, no indication for additional labs, x-ray, patient encouraged to follow-up with outpatient clinic, discharged in stable condition.  Amount and/or Complexity of Data Reviewed External Data Reviewed: notes.    Details: See above Labs: ordered. Decision-making details documented in ED Course.    Details: Glucose within his normal range  Risk Prescription drug management. Decision regarding hospitalization.  Final Clinical Impression(s) / ED Diagnoses Final diagnoses:  Foot pain, left    Rx / DC Orders ED Discharge Orders     None         Carmin Muskrat, MD 07/18/22 [phone]

## 2022-07-18 NOTE — ED Provider Notes (Signed)
[facility] Provider Note   CSN: [id number] Arrival date & time: 07/17/22  2357     History  Chief Complaint  Patient presents with   Foot Pain    Alejandro Baker is a 47 y.o. male.  37 male with past medical history of diabetes mellitus, hypertension, schizoaffective disorder and neuropathy presents for concern for left foot wound.  States that he was barefoot earlier when he stepped on a nail.  Denies any bleeding from the area.  Last tetanus is up-to-date at 06/17/2020.       Home Medications Prior to Admission medications   Medication Sig Start Date End Date Taking? Authorizing Provider  ciprofloxacin (CIPRO) 500 MG tablet Take 1 tablet (500 mg total) by mouth every 12 (twelve) hours for 7 days. 07/18/22 07/25/22 Yes Tacy Learn, PA-C  indomethacin (INDOCIN) 50 MG capsule Take 1 capsule (50 mg total) by mouth 2 (two) times daily with a meal for 10 days. 07/18/22 07/28/22 Yes Tacy Learn, PA-C  albuterol (VENTOLIN HFA) 108 (90 Base) MCG/ACT inhaler Inhale 1-2 puffs into the lungs every 6 (six) hours as needed for wheezing or shortness of breath. 05/19/21   Prosperi, Christian H, PA-C  fluticasone (FLONASE) 50 MCG/ACT nasal spray Place 1 spray into both nostrils daily. 05/19/21   Prosperi, Christian H, PA-C  metFORMIN (GLUCOPHAGE) 500 MG tablet Take 1 tablet (500 mg total) by mouth 2 (two) times daily with a meal. 06/02/21   Zachery Dakins, MD  risperiDONE (RISPERDAL) 2 MG tablet Take 1 tablet (2 mg total) by mouth at bedtime. 04/10/22 10/07/22  Eppie Gibson, MD      Allergies    Penicillins and Povidone-iodine    Review of Systems   Review of Systems Negative except as per HPI Physical Exam Updated Vital Signs BP (!) 141/93   Pulse 90   Temp 98 F (36.7 C)   Resp 18   Ht 5\' 11"  (1.803 m)   Wt 78 kg   SpO2 100%   BMI 23.98 kg/m  Physical Exam Vitals and nursing note reviewed.  Constitutional:      General: He is  not in acute distress.    Appearance: He is well-developed. He is not diaphoretic.  HENT:     Head: Normocephalic and atraumatic.  Pulmonary:     Effort: Pulmonary effort is normal.  Musculoskeletal:        General: Tenderness present. No swelling, deformity or signs of injury.  Skin:    General: Skin is warm and dry.  Neurological:     Mental Status: He is alert and oriented to person, place, and time.  Psychiatric:        Behavior: Behavior normal.     ED Results / Procedures / Treatments   Labs (all labs ordered are listed, but only abnormal results are displayed) Labs Reviewed - No data to display  EKG None  Radiology DG Foot Complete Left  Result Date: 07/18/2022 CLINICAL DATA:  Left foot pain. EXAM: LEFT FOOT - COMPLETE 3+ VIEW COMPARISON:  None Available. FINDINGS: There is no acute fracture or dislocation. There is erosive changes of the medial head of the first metatarsal concerning for gout. Correlation with clinical exam recommended. The soft tissues are unremarkable. IMPRESSION: 1. No acute fracture or dislocation. 2. Erosive changes of the medial head of the first metatarsal concerning for gout. Electronically Signed   By: Anner Crete M.D.   On: 07/18/2022 00:35  Procedures Procedures    Medications Ordered in ED Medications  ciprofloxacin (CIPRO) tablet 500 mg (500 mg Oral Given 07/18/22 0036)    ED Course/ Medical Decision Making/ A&P                             Medical Decision Making Amount and/or Complexity of Data Reviewed Radiology: ordered.  Risk Prescription drug management.   47 year old male presents with concern for puncture wound to the left foot, states that he stepped on a nail earlier today while barefoot.  Patient's feet are dirty, I do not see any obvious puncture wounds.  X-ray of the left foot does not show retained foreign body or acute bony abnormality, agree with radiologist interpretation. Plan is to soak/clean feet.  Provide clean dry socks. Will provide Cipro for concern for puncture wound and crutches if needed with referral to pcp.  X-ray with possible gout, this could be causing patient's foot pain, provided with indomethacin, labs reviewed from February 2024 with normal renal function.       Final Clinical Impression(s) / ED Diagnoses Final diagnoses:  Foot pain, left    Rx / DC Orders ED Discharge Orders          Ordered    ciprofloxacin (CIPRO) 500 MG tablet  Every 12 hours        07/18/22 0041    indomethacin (INDOCIN) 50 MG capsule  2 times daily with meals        07/18/22 0041              Tacy Learn, PA-C 07/18/22 [id number]    Merryl Hacker, MD 07/19/22 [phone]

## 2022-07-18 NOTE — ED Notes (Signed)
Pt given clean socks and edu on foot heath. Given sandwich on his way out.

## 2022-07-18 NOTE — ED Notes (Signed)
Patient agreed to leave once given [place] sandwich.  Patient given [place] sandwich bag and wheeled to lobby in wheelchair.

## 2022-07-18 NOTE — ED Notes (Addendum)
Patient's feet soaking currently.  Patient non-compliant with instructions to fully submerge feet.

## 2022-07-18 NOTE — Discharge Instructions (Addendum)
Your x-ray appears to show gout in the foot.  This can be very painful.  We have prescribed indomethacin to help treat the pain.  I do not see any puncture wounds to your foot however if you did step on a nail and the wound is not evident, this can cause infection in the foot, a prescription for antibiotics has been provided.  Please follow-up with primary care, provided with referral.

## 2022-07-22 ENCOUNTER — Emergency Department (HOSPITAL_COMMUNITY)
Admission: EM | Admit: 2022-07-22 | Discharge: 2022-07-22 | Disposition: A | Discharge status: Home / Self Care | Payer: PPO | Attending: Emergency Medicine | Admitting: Emergency Medicine

## 2022-07-22 ENCOUNTER — Other Ambulatory Visit: Payer: Self-pay

## 2022-07-22 ENCOUNTER — Encounter (HOSPITAL_COMMUNITY): Payer: Self-pay

## 2022-07-22 DIAGNOSIS — I10 Essential (primary) hypertension: Secondary | ICD-10-CM | POA: Diagnosis not present

## 2022-07-22 DIAGNOSIS — Z5900 Homelessness unspecified: Secondary | ICD-10-CM | POA: Diagnosis not present

## 2022-07-22 DIAGNOSIS — T699XXA Effect of reduced temperature, unspecified, initial encounter: Secondary | ICD-10-CM | POA: Insufficient documentation

## 2022-07-22 DIAGNOSIS — Z79899 Other long term (current) drug therapy: Secondary | ICD-10-CM | POA: Insufficient documentation

## 2022-07-22 DIAGNOSIS — R69 Illness, unspecified: Secondary | ICD-10-CM | POA: Diagnosis not present

## 2022-07-22 DIAGNOSIS — X31XXXA Exposure to excessive natural cold, initial encounter: Secondary | ICD-10-CM | POA: Diagnosis not present

## 2022-07-22 DIAGNOSIS — E119 Type 2 diabetes mellitus without complications: Secondary | ICD-10-CM | POA: Diagnosis not present

## 2022-07-22 NOTE — ED Triage Notes (Signed)
Pt. BIB [facility] stating that he is cold and wants to go to the hospital. Pt. Has no other complaints at this time.
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
## 2022-08-11 ENCOUNTER — Encounter (HOSPITAL_COMMUNITY): Payer: Self-pay | Admitting: Emergency Medicine

## 2022-08-11 ENCOUNTER — Emergency Department (HOSPITAL_COMMUNITY)
Admission: EM | Admit: 2022-08-11 | Discharge: 2022-08-11 | Disposition: A | Discharge status: Home / Self Care | Payer: PPO | Attending: Emergency Medicine | Admitting: Emergency Medicine

## 2022-08-11 ENCOUNTER — Other Ambulatory Visit: Payer: Self-pay

## 2022-08-11 DIAGNOSIS — M79672 Pain in left foot: Secondary | ICD-10-CM | POA: Insufficient documentation

## 2022-08-11 DIAGNOSIS — R58 Hemorrhage, not elsewhere classified: Secondary | ICD-10-CM | POA: Diagnosis not present

## 2022-08-11 DIAGNOSIS — M79642 Pain in left hand: Secondary | ICD-10-CM | POA: Insufficient documentation

## 2022-08-11 DIAGNOSIS — M79671 Pain in right foot: Secondary | ICD-10-CM | POA: Diagnosis not present

## 2022-08-11 DIAGNOSIS — M79641 Pain in right hand: Secondary | ICD-10-CM | POA: Insufficient documentation

## 2022-08-11 DIAGNOSIS — Z7984 Long term (current) use of oral hypoglycemic drugs: Secondary | ICD-10-CM | POA: Insufficient documentation

## 2022-08-11 DIAGNOSIS — S60519A Abrasion of unspecified hand, initial encounter: Secondary | ICD-10-CM | POA: Diagnosis not present

## 2022-08-11 DIAGNOSIS — E119 Type 2 diabetes mellitus without complications: Secondary | ICD-10-CM | POA: Diagnosis not present

## 2022-08-11 MED ORDER — IBUPROFEN 800 MG PO TABS
800.0000 mg | ORAL_TABLET | Freq: Once | ORAL | Status: AC
  Administered 2022-08-11: 800 mg via ORAL
  Filled 2022-08-11: qty 1
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
## 2022-08-11 NOTE — ED Triage Notes (Signed)
Pt arrives via EMS w/ c/o hand and feet pain. Abrasions on both hands. Unknown where they came from. A&Ox4. VSS

## 2022-08-11 NOTE — Discharge Instructions (Addendum)
Evaluation for your hand and foot pain is overall reassuring.  Recommend you follow-up with your PCP if your symptoms persist.  You can apply ice at home and use ibuprofen and Tylenol for your symptoms.

## 2022-08-11 NOTE — ED Provider Notes (Signed)
[place] EMERGENCY DEPARTMENT AT [facility] Provider Note   CSN: [id number] Arrival date & time: 08/11/22  0100     History  No chief complaint on file.  HPI Alejandro Baker is a 47 y.o. male with schizoaffective disorder, diabetes, polysubstance abuse and homelessness presenting for pain in his hands and feet.  Started 2 days ago.  States he fell while walking down the sidewalk.  Landed on his hands.  After the fall he was able to ambulate but his hands and both feet started to hurt.  Pain has been about the same 2 days ago.  Denies swelling, loss of sensation or change in his range of motion of his hands and feet. Denies numbness, tingling or weakness in his extremities.   HPI     Home Medications Prior to Admission medications   Medication Sig Start Date End Date Taking? Authorizing Provider  albuterol (VENTOLIN HFA) 108 (90 Base) MCG/ACT inhaler Inhale 1-2 puffs into the lungs every 6 (six) hours as needed for wheezing or shortness of breath. 05/19/21   Prosperi, Christian H, PA-C  fluticasone (FLONASE) 50 MCG/ACT nasal spray Place 1 spray into both nostrils daily. 05/19/21   Prosperi, Christian H, PA-C  metFORMIN (GLUCOPHAGE) 500 MG tablet Take 1 tablet (500 mg total) by mouth 2 (two) times daily with a meal. 06/02/21   [name], MD  risperiDONE (RISPERDAL) 2 MG tablet Take 1 tablet (2 mg total) by mouth at bedtime. 04/10/22 10/07/22  Alicia Amel, MD      Allergies    Penicillins and Povidone-iodine    Review of Systems   See HPI for pertinent positives  Physical Exam Updated Vital Signs BP (!) 123/90 (BP Location: Left Arm)   Pulse 71   Temp 97.9 F (36.6 C) (Oral)   Resp 18   SpO2 100%  Physical Exam Constitutional:      Appearance: Normal appearance.  HENT:     Head: Normocephalic.     Nose: Nose normal.  Eyes:     Conjunctiva/sclera: Conjunctivae normal.  Pulmonary:     Effort: Pulmonary effort is normal.  Musculoskeletal:     Right  hand: No swelling, deformity, lacerations or tenderness. Normal range of motion. Normal sensation. Normal capillary refill. Normal pulse.     Left hand: No swelling, deformity, lacerations or tenderness. Normal range of motion. Normal sensation. Normal capillary refill. Normal pulse.     Right foot: Normal. Normal range of motion and normal capillary refill. No swelling or deformity. Normal pulse.     Left foot: Normal. Normal range of motion and normal capillary refill. No swelling or deformity. Normal pulse.     Comments: Brown discoloration overlying the knuckles on both hands, no visible new abrasions.  No swelling, discoloration or tenderness noted in the calves.  Neurological:     Mental Status: He is alert.  Psychiatric:        Mood and Affect: Mood normal.     ED Results / Procedures / Treatments   Labs (all labs ordered are listed, but only abnormal results are displayed) Labs Reviewed - No data to display  EKG None  Radiology No results found.  Procedures Procedures    Medications Ordered in ED Medications  ibuprofen (ADVIL) tablet 800 mg (has no administration in time range)    ED Course/ Medical Decision Making/ A&P  Medical Decision Making  47 year old male who is well-appearing, with noted dirty close and areas of old dirt around his feet, suspect he is homeless.  Exam of his hands and feet were unremarkable.  There is evidence of old bruising overlying his knuckles, however range of motion, strength and sensation in hands and feet were both reassuring.  Have low suspicion for new fracture or dislocation.  Treated his pain with ibuprofen.  Provided sandwich and something to drink for him.  Advised him to follow-up with his PCP for his foot and hand pain.  Discussed pertinent return precautions.  Vital stable at discharge.        Final Clinical Impression(s) / ED Diagnoses Final diagnoses:  Pain in both feet  Pain in both  hands    Rx / DC Orders ED Discharge Orders     None         Gareth Eagle, PA-C 08/11/22 0805    Terald Sleeper, MD 08/11/22 [phone]

## 2022-08-20 ENCOUNTER — Other Ambulatory Visit: Payer: Self-pay

## 2022-08-20 ENCOUNTER — Emergency Department (HOSPITAL_COMMUNITY)
Admission: EM | Admit: 2022-08-20 | Discharge: 2022-08-20 | Disposition: A | Discharge status: Home / Self Care | Payer: PPO | Attending: Emergency Medicine | Admitting: Emergency Medicine

## 2022-08-20 ENCOUNTER — Encounter (HOSPITAL_COMMUNITY): Payer: Self-pay

## 2022-08-20 DIAGNOSIS — M25532 Pain in left wrist: Secondary | ICD-10-CM | POA: Diagnosis not present

## 2022-08-20 DIAGNOSIS — M25539 Pain in unspecified wrist: Secondary | ICD-10-CM | POA: Diagnosis not present

## 2022-08-20 DIAGNOSIS — I10 Essential (primary) hypertension: Secondary | ICD-10-CM | POA: Diagnosis not present

## 2022-08-20 DIAGNOSIS — Z5900 Homelessness unspecified: Secondary | ICD-10-CM | POA: Diagnosis not present

## 2022-08-20 DIAGNOSIS — R509 Fever, unspecified: Secondary | ICD-10-CM | POA: Diagnosis not present

## 2022-08-20 NOTE — ED Provider Notes (Signed)
  [facility]-EMERGENCY DEPT [facility] Emergency Department Provider Note MRN:  [id number]  Arrival date & time: 08/20/22     Chief Complaint   Homeless  History of Present Illness   Alejandro Baker is a 47 y.o. year-old male presents to the ED with chief complaint of being cold.  Was trespassed off of [place] property and asked to be brought to the hospital.  Complains of being cold and having wrist pain.  History provided by patient.   Review of Systems  Pertinent positive and negative review of systems noted in HPI.    Physical Exam   Vitals:   08/20/22 0529  BP: (!) 133/95  Pulse: 78  Resp: 18  Temp: 97.7 F (36.5 C)  SpO2: 97%    CONSTITUTIONAL:  non toxic-appearing, NAD NEURO:  Alert and oriented x 3, CN 3-12 grossly intact EYES:  eyes equal and reactive ENT/NECK:  Supple, no stridor  CARDIO:  normal rate, regular rhythm, appears well-perfused  PULM:  No respiratory distress,  GI/GU:  non-distended,  MSK/SPINE:  No gross deformities, no edema, moves all extremities  SKIN:  no rash, atraumatic   *Additional and/or pertinent findings included in MDM below  Diagnostic and Interventional Summary    EKG Interpretation  Date/Time:    Ventricular Rate:    PR Interval:    QRS Duration:   QT Interval:    QTC Calculation:   R Axis:     Text Interpretation:         Labs Reviewed - No data to display  No orders to display    Medications - No data to display   Procedures  /  Critical Care Procedures  ED Course and Medical Decision Making  I have reviewed the triage vital signs, the nursing notes, and pertinent available records from the EMR.  Social Determinants Affecting Complexity of Care: Patient is homelessness. Discussed patient's lack of housing.  Resources provided for homeless shelters.  ED Course:    Medical Decision Making Vitals are stable.  No visible injuries.  Doesn't appear to be in any distress.      Consultants: No consultations  were needed in caring for this patient.   Treatment and Plan: Emergency department workup does not suggest an emergent condition requiring admission or immediate intervention beyond  what has been performed at this time. The patient is safe for discharge and has  been instructed to return immediately for worsening symptoms, change in  symptoms or any other concerns    Final Clinical Impressions(s) / ED Diagnoses     ICD-10-CM   1. Homeless  Z59.00       ED Discharge Orders     None         Discharge Instructions Discussed with and Provided to Patient:   Discharge Instructions   None      Roxy Horseman, PA-C 08/20/22 0539    Molpus, Jonny Ruiz, MD 08/20/22 [phone]

## 2022-08-20 NOTE — ED Notes (Addendum)
Patient given discharge instructions and resources for shelters. Patient appeared irritated and did not want to take discharge papers with him. Patient ambulatory out of ED.

## 2022-08-20 NOTE — ED Triage Notes (Signed)
Patient brought in by EMS, reports patient was being trespassed at [employer] station by PD and EMS was called. Patient reports wrist pain to EMS for unknown amount of time. Patient reports he is "frozen from being outside and has hypothermia," temperature 97.7 here. Patient does not endorses wrist pain to this RN.

## 2022-08-29 ENCOUNTER — Encounter: Payer: Self-pay | Admitting: Physician Assistant

## 2022-08-29 NOTE — Progress Notes (Signed)
Pt sitting in the front "air-lock".  He c/o having stepped on a nail or something sharp and it cut his foot.   Pt speech is rambling but understandable.  At one point, he is tossing coins as if at a carnival.   His foot was cleaned partially, pt resisted further cleaning. Although he reacted as if it were tender, no wound was seen, no pus, drainage or swelling.   His shoes were in extremely poor condition.  EMS had been called, but said they did not have a truck available.   Pt given a pair of shoes size 11-1/2 and a pair of socks.   He refused EMS and said he was ok.   Last T-dap was in 2022.   Pt left the facility, ambulating w/out a limp or other difficulty. He refused further care.  Of note, he mentioned being out of his Risperdal, could not say when he was on it last.  He mentioned that his Psych MD was Dr Hortencia Pilar, could not say when he saw her last.   Theodore Demark, PA-C 08/29/2022 5:33 PM

## 2022-09-01 ENCOUNTER — Emergency Department (HOSPITAL_COMMUNITY): Admission: EM | Admit: 2022-09-01 | Discharge: 2022-09-01 | Discharge status: Against Medical Advice | Payer: PPO

## 2022-09-01 NOTE — ED Notes (Signed)
Staff reports seeing Pt walk out the door before being seen in triage. Pt called for X2. Pt could not be found.

## 2022-09-11 ENCOUNTER — Emergency Department (HOSPITAL_BASED_OUTPATIENT_CLINIC_OR_DEPARTMENT_OTHER): Payer: PPO

## 2022-09-11 ENCOUNTER — Emergency Department (HOSPITAL_BASED_OUTPATIENT_CLINIC_OR_DEPARTMENT_OTHER)
Admission: EM | Admit: 2022-09-11 | Discharge: 2022-09-11 | Discharge status: Against Medical Advice | Payer: PPO | Attending: Emergency Medicine | Admitting: Emergency Medicine

## 2022-09-11 ENCOUNTER — Encounter (HOSPITAL_BASED_OUTPATIENT_CLINIC_OR_DEPARTMENT_OTHER): Payer: Self-pay

## 2022-09-11 ENCOUNTER — Other Ambulatory Visit: Payer: Self-pay

## 2022-09-11 DIAGNOSIS — W228XXA Striking against or struck by other objects, initial encounter: Secondary | ICD-10-CM | POA: Insufficient documentation

## 2022-09-11 DIAGNOSIS — Z5321 Procedure and treatment not carried out due to patient leaving prior to being seen by health care provider: Secondary | ICD-10-CM | POA: Diagnosis not present

## 2022-09-11 DIAGNOSIS — M79671 Pain in right foot: Secondary | ICD-10-CM | POA: Insufficient documentation

## 2022-09-11 HISTORY — DX: Type 2 diabetes mellitus without complications: E11.9

## 2022-09-11 NOTE — ED Triage Notes (Signed)
Pt was BIB by [facility].  Pt walked to EMS truck and said he had right foot pain.

## 2022-10-19 DIAGNOSIS — M542 Cervicalgia: Secondary | ICD-10-CM | POA: Diagnosis not present

## 2022-10-19 DIAGNOSIS — M47812 Spondylosis without myelopathy or radiculopathy, cervical region: Secondary | ICD-10-CM | POA: Diagnosis not present

## 2022-10-21 DIAGNOSIS — R462 Strange and inexplicable behavior: Secondary | ICD-10-CM | POA: Diagnosis not present

## 2022-10-21 DIAGNOSIS — M542 Cervicalgia: Secondary | ICD-10-CM | POA: Diagnosis not present

## 2022-10-25 DIAGNOSIS — Z59819 Housing instability, housed unspecified: Secondary | ICD-10-CM | POA: Diagnosis not present

## 2022-10-25 DIAGNOSIS — Z008 Encounter for other general examination: Secondary | ICD-10-CM | POA: Diagnosis not present

## 2022-10-25 DIAGNOSIS — M79673 Pain in unspecified foot: Secondary | ICD-10-CM | POA: Diagnosis not present

## 2022-10-25 DIAGNOSIS — I959 Hypotension, unspecified: Secondary | ICD-10-CM | POA: Diagnosis not present

## 2022-10-25 DIAGNOSIS — R109 Unspecified abdominal pain: Secondary | ICD-10-CM | POA: Diagnosis not present

## 2022-10-25 DIAGNOSIS — R739 Hyperglycemia, unspecified: Secondary | ICD-10-CM | POA: Diagnosis not present

## 2022-10-28 DIAGNOSIS — S99922A Unspecified injury of left foot, initial encounter: Secondary | ICD-10-CM | POA: Diagnosis not present

## 2022-10-28 DIAGNOSIS — M79673 Pain in unspecified foot: Secondary | ICD-10-CM | POA: Diagnosis not present

## 2022-10-28 DIAGNOSIS — S99929A Unspecified injury of unspecified foot, initial encounter: Secondary | ICD-10-CM | POA: Diagnosis not present

## 2022-10-28 DIAGNOSIS — M7732 Calcaneal spur, left foot: Secondary | ICD-10-CM | POA: Diagnosis not present

## 2022-10-31 DIAGNOSIS — F29 Unspecified psychosis not due to a substance or known physiological condition: Secondary | ICD-10-CM | POA: Diagnosis not present

## 2022-10-31 DIAGNOSIS — F17210 Nicotine dependence, cigarettes, uncomplicated: Secondary | ICD-10-CM | POA: Diagnosis not present

## 2022-10-31 DIAGNOSIS — R0602 Shortness of breath: Secondary | ICD-10-CM | POA: Diagnosis not present

## 2022-10-31 DIAGNOSIS — L299 Pruritus, unspecified: Secondary | ICD-10-CM | POA: Diagnosis not present

## 2022-11-07 DIAGNOSIS — Z5941 Food insecurity: Secondary | ICD-10-CM | POA: Diagnosis not present

## 2022-11-07 DIAGNOSIS — Z556 Problems related to health literacy: Secondary | ICD-10-CM | POA: Diagnosis not present

## 2022-11-07 DIAGNOSIS — M79671 Pain in right foot: Secondary | ICD-10-CM | POA: Diagnosis not present

## 2022-11-07 DIAGNOSIS — M542 Cervicalgia: Secondary | ICD-10-CM | POA: Diagnosis not present

## 2022-11-07 DIAGNOSIS — M79673 Pain in unspecified foot: Secondary | ICD-10-CM | POA: Diagnosis not present

## 2022-11-07 DIAGNOSIS — R21 Rash and other nonspecific skin eruption: Secondary | ICD-10-CM | POA: Diagnosis not present

## 2022-11-07 DIAGNOSIS — F17210 Nicotine dependence, cigarettes, uncomplicated: Secondary | ICD-10-CM | POA: Diagnosis not present

## 2022-11-07 DIAGNOSIS — Z5900 Homelessness unspecified: Secondary | ICD-10-CM | POA: Diagnosis not present

## 2022-11-07 DIAGNOSIS — R739 Hyperglycemia, unspecified: Secondary | ICD-10-CM | POA: Diagnosis not present

## 2022-11-07 DIAGNOSIS — M79672 Pain in left foot: Secondary | ICD-10-CM | POA: Diagnosis not present

## 2022-11-07 DIAGNOSIS — M25552 Pain in left hip: Secondary | ICD-10-CM | POA: Diagnosis not present

## 2022-11-07 DIAGNOSIS — W19XXXA Unspecified fall, initial encounter: Secondary | ICD-10-CM | POA: Diagnosis not present

## 2022-11-12 DIAGNOSIS — F17210 Nicotine dependence, cigarettes, uncomplicated: Secondary | ICD-10-CM | POA: Diagnosis not present

## 2022-11-12 DIAGNOSIS — Z556 Problems related to health literacy: Secondary | ICD-10-CM | POA: Diagnosis not present

## 2022-11-12 DIAGNOSIS — Z5900 Homelessness unspecified: Secondary | ICD-10-CM | POA: Diagnosis not present

## 2022-11-12 DIAGNOSIS — M79672 Pain in left foot: Secondary | ICD-10-CM | POA: Diagnosis not present

## 2022-11-12 DIAGNOSIS — R739 Hyperglycemia, unspecified: Secondary | ICD-10-CM | POA: Diagnosis not present

## 2022-11-12 DIAGNOSIS — Z5902 Unsheltered homelessness: Secondary | ICD-10-CM | POA: Diagnosis not present

## 2022-11-12 DIAGNOSIS — M79671 Pain in right foot: Secondary | ICD-10-CM | POA: Diagnosis not present

## 2022-11-12 DIAGNOSIS — G479 Sleep disorder, unspecified: Secondary | ICD-10-CM | POA: Diagnosis not present

## 2022-11-12 DIAGNOSIS — F99 Mental disorder, not otherwise specified: Secondary | ICD-10-CM | POA: Diagnosis not present

## 2022-11-12 DIAGNOSIS — Z753 Unavailability and inaccessibility of health-care facilities: Secondary | ICD-10-CM | POA: Diagnosis not present

## 2022-11-14 DIAGNOSIS — M542 Cervicalgia: Secondary | ICD-10-CM | POA: Diagnosis not present

## 2022-11-14 DIAGNOSIS — Z59819 Housing instability, housed unspecified: Secondary | ICD-10-CM | POA: Diagnosis not present

## 2022-11-15 DIAGNOSIS — J029 Acute pharyngitis, unspecified: Secondary | ICD-10-CM | POA: Diagnosis not present

## 2022-11-15 DIAGNOSIS — R739 Hyperglycemia, unspecified: Secondary | ICD-10-CM | POA: Diagnosis not present

## 2022-11-15 DIAGNOSIS — F10129 Alcohol abuse with intoxication, unspecified: Secondary | ICD-10-CM | POA: Diagnosis not present

## 2022-11-15 DIAGNOSIS — F10120 Alcohol abuse with intoxication, uncomplicated: Secondary | ICD-10-CM | POA: Diagnosis not present

## 2022-11-15 DIAGNOSIS — Z5900 Homelessness unspecified: Secondary | ICD-10-CM | POA: Diagnosis not present

## 2022-11-15 DIAGNOSIS — F10929 Alcohol use, unspecified with intoxication, unspecified: Secondary | ICD-10-CM | POA: Diagnosis not present

## 2022-11-15 DIAGNOSIS — G4489 Other headache syndrome: Secondary | ICD-10-CM | POA: Diagnosis not present

## 2022-11-15 DIAGNOSIS — E1165 Type 2 diabetes mellitus with hyperglycemia: Secondary | ICD-10-CM | POA: Diagnosis not present

## 2022-11-16 DIAGNOSIS — H538 Other visual disturbances: Secondary | ICD-10-CM | POA: Diagnosis not present

## 2022-11-16 DIAGNOSIS — M542 Cervicalgia: Secondary | ICD-10-CM | POA: Diagnosis not present

## 2022-11-17 DIAGNOSIS — R059 Cough, unspecified: Secondary | ICD-10-CM | POA: Diagnosis not present

## 2022-11-17 DIAGNOSIS — R109 Unspecified abdominal pain: Secondary | ICD-10-CM | POA: Diagnosis not present

## 2022-11-17 DIAGNOSIS — R042 Hemoptysis: Secondary | ICD-10-CM | POA: Diagnosis not present

## 2022-11-17 DIAGNOSIS — Z765 Malingerer [conscious simulation]: Secondary | ICD-10-CM | POA: Diagnosis not present

## 2022-11-17 DIAGNOSIS — Z5900 Homelessness unspecified: Secondary | ICD-10-CM | POA: Diagnosis not present

## 2022-11-17 DIAGNOSIS — R0989 Other specified symptoms and signs involving the circulatory and respiratory systems: Secondary | ICD-10-CM | POA: Diagnosis not present

## 2022-11-18 DIAGNOSIS — R042 Hemoptysis: Secondary | ICD-10-CM | POA: Diagnosis not present

## 2022-11-18 DIAGNOSIS — R059 Cough, unspecified: Secondary | ICD-10-CM | POA: Diagnosis not present

## 2022-11-18 DIAGNOSIS — R0989 Other specified symptoms and signs involving the circulatory and respiratory systems: Secondary | ICD-10-CM | POA: Diagnosis not present

## 2022-11-18 DIAGNOSIS — Z765 Malingerer [conscious simulation]: Secondary | ICD-10-CM | POA: Diagnosis not present

## 2022-11-18 DIAGNOSIS — R109 Unspecified abdominal pain: Secondary | ICD-10-CM | POA: Diagnosis not present

## 2022-11-27 DIAGNOSIS — Z5900 Homelessness unspecified: Secondary | ICD-10-CM | POA: Diagnosis not present

## 2022-11-27 DIAGNOSIS — F17210 Nicotine dependence, cigarettes, uncomplicated: Secondary | ICD-10-CM | POA: Diagnosis not present

## 2022-11-27 DIAGNOSIS — Z5329 Procedure and treatment not carried out because of patient's decision for other reasons: Secondary | ICD-10-CM | POA: Diagnosis not present

## 2022-11-27 DIAGNOSIS — M79673 Pain in unspecified foot: Secondary | ICD-10-CM | POA: Diagnosis not present

## 2022-11-27 DIAGNOSIS — M79671 Pain in right foot: Secondary | ICD-10-CM | POA: Diagnosis not present

## 2022-11-27 DIAGNOSIS — F250 Schizoaffective disorder, bipolar type: Secondary | ICD-10-CM | POA: Diagnosis not present

## 2022-11-27 DIAGNOSIS — M79672 Pain in left foot: Secondary | ICD-10-CM | POA: Diagnosis not present

## 2022-11-27 DIAGNOSIS — Z765 Malingerer [conscious simulation]: Secondary | ICD-10-CM | POA: Diagnosis not present

## 2022-11-28 DIAGNOSIS — R739 Hyperglycemia, unspecified: Secondary | ICD-10-CM | POA: Diagnosis not present

## 2022-11-28 DIAGNOSIS — Z765 Malingerer [conscious simulation]: Secondary | ICD-10-CM | POA: Diagnosis not present

## 2022-11-28 DIAGNOSIS — M79672 Pain in left foot: Secondary | ICD-10-CM | POA: Diagnosis not present

## 2022-12-05 DIAGNOSIS — M791 Myalgia, unspecified site: Secondary | ICD-10-CM | POA: Diagnosis not present

## 2022-12-05 DIAGNOSIS — Z5941 Food insecurity: Secondary | ICD-10-CM | POA: Diagnosis not present

## 2022-12-05 DIAGNOSIS — R0789 Other chest pain: Secondary | ICD-10-CM | POA: Diagnosis not present

## 2022-12-05 DIAGNOSIS — M25511 Pain in right shoulder: Secondary | ICD-10-CM | POA: Diagnosis not present

## 2022-12-07 DIAGNOSIS — G4489 Other headache syndrome: Secondary | ICD-10-CM | POA: Diagnosis not present

## 2022-12-07 DIAGNOSIS — R519 Headache, unspecified: Secondary | ICD-10-CM | POA: Diagnosis not present

## 2022-12-07 DIAGNOSIS — R739 Hyperglycemia, unspecified: Secondary | ICD-10-CM | POA: Diagnosis not present

## 2022-12-07 DIAGNOSIS — M5030 Other cervical disc degeneration, unspecified cervical region: Secondary | ICD-10-CM | POA: Diagnosis not present

## 2022-12-07 DIAGNOSIS — Z043 Encounter for examination and observation following other accident: Secondary | ICD-10-CM | POA: Diagnosis not present

## 2022-12-07 DIAGNOSIS — Z5900 Homelessness unspecified: Secondary | ICD-10-CM | POA: Diagnosis not present

## 2022-12-08 DIAGNOSIS — Z5321 Procedure and treatment not carried out due to patient leaving prior to being seen by health care provider: Secondary | ICD-10-CM | POA: Diagnosis not present

## 2022-12-09 DIAGNOSIS — Z5900 Homelessness unspecified: Secondary | ICD-10-CM | POA: Diagnosis not present

## 2022-12-09 DIAGNOSIS — Z5321 Procedure and treatment not carried out due to patient leaving prior to being seen by health care provider: Secondary | ICD-10-CM | POA: Diagnosis not present

## 2022-12-09 DIAGNOSIS — Z753 Unavailability and inaccessibility of health-care facilities: Secondary | ICD-10-CM | POA: Diagnosis not present

## 2022-12-09 DIAGNOSIS — L03317 Cellulitis of buttock: Secondary | ICD-10-CM | POA: Diagnosis not present

## 2023-01-31 DIAGNOSIS — M542 Cervicalgia: Secondary | ICD-10-CM | POA: Diagnosis not present

## 2023-01-31 DIAGNOSIS — G8929 Other chronic pain: Secondary | ICD-10-CM | POA: Diagnosis not present

## 2023-02-04 ENCOUNTER — Other Ambulatory Visit: Payer: Self-pay

## 2023-02-04 ENCOUNTER — Other Ambulatory Visit (HOSPITAL_BASED_OUTPATIENT_CLINIC_OR_DEPARTMENT_OTHER): Payer: Self-pay

## 2023-02-04 ENCOUNTER — Emergency Department (HOSPITAL_BASED_OUTPATIENT_CLINIC_OR_DEPARTMENT_OTHER)
Admission: EM | Admit: 2023-02-04 | Discharge: 2023-02-04 | Disposition: A | Discharge status: Home / Self Care | Payer: PPO | Attending: Emergency Medicine | Admitting: Emergency Medicine

## 2023-02-04 ENCOUNTER — Encounter (HOSPITAL_BASED_OUTPATIENT_CLINIC_OR_DEPARTMENT_OTHER): Payer: Self-pay

## 2023-02-04 DIAGNOSIS — I10 Essential (primary) hypertension: Secondary | ICD-10-CM | POA: Insufficient documentation

## 2023-02-04 DIAGNOSIS — Z7984 Long term (current) use of oral hypoglycemic drugs: Secondary | ICD-10-CM | POA: Diagnosis not present

## 2023-02-04 DIAGNOSIS — M62838 Other muscle spasm: Secondary | ICD-10-CM | POA: Insufficient documentation

## 2023-02-04 DIAGNOSIS — E119 Type 2 diabetes mellitus without complications: Secondary | ICD-10-CM | POA: Insufficient documentation

## 2023-02-04 DIAGNOSIS — M542 Cervicalgia: Secondary | ICD-10-CM | POA: Insufficient documentation

## 2023-02-04 DIAGNOSIS — R000 Tachycardia, unspecified: Secondary | ICD-10-CM | POA: Diagnosis not present

## 2023-02-04 MED ORDER — LIDOCAINE 5 % EX PTCH
1.0000 | MEDICATED_PATCH | CUTANEOUS | Status: DC
  Administered 2023-02-04: 1 via TRANSDERMAL
  Filled 2023-02-04: qty 1

## 2023-02-04 MED ORDER — KETOROLAC TROMETHAMINE 60 MG/2ML IM SOLN
60.0000 mg | Freq: Once | INTRAMUSCULAR | Status: AC
  Administered 2023-02-04: 60 mg via INTRAMUSCULAR
  Filled 2023-02-04: qty 2

## 2023-02-04 MED ORDER — DEXAMETHASONE 4 MG PO TABS
10.0000 mg | ORAL_TABLET | Freq: Once | ORAL | Status: AC
  Administered 2023-02-04: 10 mg via ORAL
  Filled 2023-02-04: qty 3

## 2023-02-04 NOTE — ED Provider Notes (Signed)
Alejandro Baker is a 47 y.o. male.  Patient here right-sided neck pain.  Worse with movement.  Denies any fever or chills.  Denies any fall or traumas.  Patient is homeless staying at the [facility].  He is hungry and asking for food.  He denies any alcohol or drug use today.  Denies any history of IV drug use.  Denies any weakness numbness or tingling throughout especially in his upper extremities.  Has had this before.  Has been seems to have helped otherwise.  The history is provided by the patient.       Home Medications Prior to Admission medications   Medication Sig Start Date End Date Taking? Authorizing Provider  albuterol (VENTOLIN HFA) 108 (90 Base) MCG/ACT inhaler Inhale 1-2 puffs into the lungs every 6 (six) hours as needed for wheezing or shortness of breath. 05/19/21   Prosperi, Christian H, PA-C  fluticasone (FLONASE) 50 MCG/ACT nasal spray Place 1 spray into both nostrils daily. 05/19/21   Prosperi, Christian H, PA-C  metFORMIN (GLUCOPHAGE) 500 MG tablet Take 1 tablet (500 mg total) by mouth 2 (two) times daily with a meal. 06/02/21   [name], MD  risperiDONE (RISPERDAL) 2 MG tablet Take 1 tablet (2 mg total) by mouth at bedtime. 04/10/22 10/07/22  Alicia Amel, MD      Allergies    Penicillins and Povidone-iodine    Review of Systems   Review of Systems  Physical Exam Updated Vital Signs BP (!) 118/92 (BP Location: Right Arm)   Pulse (!) 106   Temp 98.2 F (36.8 C) (Oral)   Resp 17   Ht 6' (1.829 m)   Wt 81.3 kg   SpO2 99%   BMI 24.30 kg/m  Physical Exam Vitals and nursing note reviewed.  Constitutional:      General: He is not in acute distress.    Appearance: He is well-developed. He is not ill-appearing.  HENT:     Head: Normocephalic and  atraumatic.     Right Ear: Tympanic membrane normal.     Mouth/Throat:     Mouth: Mucous membranes are moist.  Eyes:     Extraocular Movements: Extraocular movements intact.     Conjunctiva/sclera: Conjunctivae normal.     Pupils: Pupils are equal, round, and reactive to light.  Cardiovascular:     Rate and Rhythm: Normal rate and regular rhythm.     Heart sounds: No murmur heard. Pulmonary:     Effort: Pulmonary effort is normal. No respiratory distress.     Breath sounds: Normal breath sounds.  Abdominal:     Palpations: Abdomen is soft.     Tenderness: There is no abdominal tenderness.  Musculoskeletal:        General: Tenderness present. No swelling. Normal range of motion.     Cervical back: Normal range of motion and neck supple.     Comments: Tenderness to the paraspinal cervical muscles on the right, no midline spinal tenderness  Skin:    General: Skin is warm and dry.     Capillary Refill: Capillary refill takes less than 2 seconds.     Comments: No rash or warmth to the back or skin surrounding the spine  Neurological:     General: No focal deficit  present.     Mental Status: He is alert and oriented to person, place, and time.     Cranial Nerves: No cranial nerve deficit.     Sensory: No sensory deficit.     Motor: No weakness.     Coordination: Coordination normal.     Comments: 5+ out of 5 strength throughout, normal sensation, no drift, normal finger-to-nose finger, normal speech  Psychiatric:        Mood and Affect: Mood normal.     ED Results / Procedures / Treatments   Labs (all labs ordered are listed, but only abnormal results are displayed) Labs Reviewed - No data to display  EKG EKG Interpretation Date/Time:  Monday February 04 2023 12:43:51 EDT Ventricular Rate:  136 PR Interval:  155 QRS Duration:  89 QT Interval:  284 QTC Calculation: 428 R Axis:   40  Text Interpretation: Sinus tachycardia Consider right atrial enlargement Confirmed by  Virgina Norfolk [phone]) on 02/04/2023 12:45:55 PM  Radiology No results found.  Procedures Procedures    Medications Ordered in ED Medications  lidocaine (LIDODERM) 5 % 1 patch (1 patch Transdermal Patch Applied 02/04/23 1301)  ketorolac (TORADOL) injection 60 mg (60 mg Intramuscular Given 02/04/23 1258)  dexamethasone (DECADRON) tablet 10 mg (10 mg Oral Given 02/04/23 1259)    ED Course/ Medical Decision Making/ A&P                                 Medical Decision Making Risk Prescription drug management.   Alejandro Baker is here with neck pain and muscle spasm.  History of hypertension, schizoaffective disorder, diabetes, homelessness.  Overall vital signs unremarkable.  He was originally tachycardic when he came in but when I am talking with him his heart rate is 100.  He is tender reproducibly in the right paraspinal muscles of the cervical spine.  No midline spinal tenderness.  He denies any fall or traumas.  Denies any weakness numbness tingling.  He has normal strength and sensation throughout.  Have no concern for spinal cord injury or process.  This seems consistent with a muscle spasm.  He is currently living at the [facility] most days.  He is homeless.  He is asking for food.  He denies any alcohol or IV drug use.  He last drank yesterday.  He denies any chest pain shortness of breath abdominal pain.  There does not appear to be any infectious process going on.  Does not seem to be any traumatic process.  He is neurovascular neuromuscular intact on exam.  Patient was given Toradol, Decadron, lidocaine patch with improvement.  He is able to eat and drink without any issues.  Overall understands return precautions.  Discharged in good condition.  This chart was dictated using voice recognition software.  Despite best efforts to proofread,  errors can occur which can change the documentation meaning.         Final Clinical Impression(s) / ED Diagnoses Final diagnoses:  Neck pain   Muscle spasm    Rx / DC Orders ED Discharge Orders     None         Virgina Norfolk, DO 02/04/23 1331

## 2023-02-04 NOTE — ED Triage Notes (Signed)
In for eval of posterior neck pain onset yesterday. Denies injury. Denies tingling or numbness in hands.

## 2023-02-06 ENCOUNTER — Emergency Department (HOSPITAL_COMMUNITY)
Admission: EM | Admit: 2023-02-06 | Discharge: 2023-02-07 | Disposition: A | Discharge status: Home / Self Care | Payer: PPO | Attending: Emergency Medicine | Admitting: Emergency Medicine

## 2023-02-06 ENCOUNTER — Encounter (HOSPITAL_COMMUNITY): Payer: Self-pay | Admitting: Emergency Medicine

## 2023-02-06 ENCOUNTER — Other Ambulatory Visit: Payer: Self-pay

## 2023-02-06 DIAGNOSIS — R4182 Altered mental status, unspecified: Secondary | ICD-10-CM | POA: Insufficient documentation

## 2023-02-06 DIAGNOSIS — L97411 Non-pressure chronic ulcer of right heel and midfoot limited to breakdown of skin: Secondary | ICD-10-CM | POA: Insufficient documentation

## 2023-02-06 DIAGNOSIS — E1165 Type 2 diabetes mellitus with hyperglycemia: Secondary | ICD-10-CM | POA: Diagnosis not present

## 2023-02-06 DIAGNOSIS — Z794 Long term (current) use of insulin: Secondary | ICD-10-CM | POA: Insufficient documentation

## 2023-02-06 DIAGNOSIS — L97519 Non-pressure chronic ulcer of other part of right foot with unspecified severity: Secondary | ICD-10-CM | POA: Diagnosis not present

## 2023-02-06 DIAGNOSIS — L97421 Non-pressure chronic ulcer of left heel and midfoot limited to breakdown of skin: Secondary | ICD-10-CM | POA: Diagnosis not present

## 2023-02-06 DIAGNOSIS — E11621 Type 2 diabetes mellitus with foot ulcer: Secondary | ICD-10-CM | POA: Diagnosis not present

## 2023-02-06 DIAGNOSIS — R739 Hyperglycemia, unspecified: Secondary | ICD-10-CM | POA: Diagnosis not present

## 2023-02-06 DIAGNOSIS — Z7984 Long term (current) use of oral hypoglycemic drugs: Secondary | ICD-10-CM | POA: Insufficient documentation

## 2023-02-06 DIAGNOSIS — E08621 Diabetes mellitus due to underlying condition with foot ulcer: Secondary | ICD-10-CM

## 2023-02-06 NOTE — ED Triage Notes (Signed)
BIBA Per EMS: pt arrives w/ multiple ulcers on bilateral feet. Open and draining. Pt CBG 521  Noncompliant w/ meds VSS

## 2023-02-07 ENCOUNTER — Emergency Department (HOSPITAL_COMMUNITY): Payer: PPO

## 2023-02-07 ENCOUNTER — Other Ambulatory Visit (HOSPITAL_COMMUNITY): Payer: Self-pay

## 2023-02-07 DIAGNOSIS — E1165 Type 2 diabetes mellitus with hyperglycemia: Secondary | ICD-10-CM | POA: Diagnosis not present

## 2023-02-07 DIAGNOSIS — R4182 Altered mental status, unspecified: Secondary | ICD-10-CM | POA: Diagnosis not present

## 2023-02-07 LAB — CBC WITH DIFFERENTIAL/PLATELET
Abs Immature Granulocytes: 0.1 10*3/uL — ABNORMAL HIGH (ref 0.00–0.07)
Basophils Absolute: 0.1 10*3/uL (ref 0.0–0.1)
Basophils Relative: 1 %
Eosinophils Absolute: 0.2 10*3/uL (ref 0.0–0.5)
Eosinophils Relative: 2 %
HCT: 38.7 % — ABNORMAL LOW (ref 39.0–52.0)
Hemoglobin: 13.3 g/dL (ref 13.0–17.0)
Immature Granulocytes: 1 %
Lymphocytes Relative: 37 %
Lymphs Abs: 3.7 10*3/uL (ref 0.7–4.0)
MCH: 30.4 pg (ref 26.0–34.0)
MCHC: 34.4 g/dL (ref 30.0–36.0)
MCV: 88.6 fL (ref 80.0–100.0)
Monocytes Absolute: 0.9 10*3/uL (ref 0.1–1.0)
Monocytes Relative: 9 %
Neutro Abs: 5 10*3/uL (ref 1.7–7.7)
Neutrophils Relative %: 50 %
Platelets: 308 10*3/uL (ref 150–400)
RBC: 4.37 MIL/uL (ref 4.22–5.81)
RDW: 13.2 % (ref 11.5–15.5)
WBC: 9.9 10*3/uL (ref 4.0–10.5)
nRBC: 0 % (ref 0.0–0.2)

## 2023-02-07 LAB — COMPREHENSIVE METABOLIC PANEL
ALT: 23 U/L (ref 0–44)
AST: 16 U/L (ref 15–41)
Albumin: 4.2 g/dL (ref 3.5–5.0)
Alkaline Phosphatase: 66 U/L (ref 38–126)
Anion gap: 10 (ref 5–15)
BUN: 13 mg/dL (ref 6–20)
CO2: 27 mmol/L (ref 22–32)
Calcium: 9 mg/dL (ref 8.9–10.3)
Chloride: 100 mmol/L (ref 98–111)
Creatinine, Ser: 0.82 mg/dL (ref 0.61–1.24)
GFR, Estimated: >60 mL/min (ref >60)
Glucose, Bld: 391 mg/dL — ABNORMAL HIGH (ref 70–99)
Potassium: 3.4 mmol/L — ABNORMAL LOW (ref 3.5–5.1)
Sodium: 137 mmol/L (ref 135–145)
Total Bilirubin: 0.2 mg/dL — ABNORMAL LOW (ref 0.3–1.2)
Total Protein: 7 g/dL (ref 6.5–8.1)

## 2023-02-07 LAB — CBG MONITORING, ED
Glucose-Capillary: 367 mg/dL — ABNORMAL HIGH (ref 70–99)
Glucose-Capillary: 399 mg/dL — ABNORMAL HIGH (ref 70–99)
Glucose-Capillary: 406 mg/dL — ABNORMAL HIGH (ref 70–99)
Glucose-Capillary: 420 mg/dL — ABNORMAL HIGH (ref 70–99)

## 2023-02-07 LAB — ETHANOL: Alcohol, Ethyl (B): <10 mg/dL (ref <10)

## 2023-02-07 MED ORDER — METFORMIN HCL 500 MG PO TABS
500.0000 mg | ORAL_TABLET | Freq: Two times a day (BID) | ORAL | 0 refills | Status: DC
Start: 2023-02-07 — End: 2023-02-07

## 2023-02-07 MED ORDER — CEPHALEXIN 500 MG PO CAPS
500.0000 mg | ORAL_CAPSULE | Freq: Once | ORAL | Status: AC
  Administered 2023-02-07: 500 mg via ORAL
  Filled 2023-02-07: qty 1

## 2023-02-07 MED ORDER — DOXYCYCLINE HYCLATE 100 MG PO CAPS
100.0000 mg | ORAL_CAPSULE | Freq: Two times a day (BID) | ORAL | 0 refills | Status: DC
  Filled 2023-02-07: qty 20, 10d supply, fill #0

## 2023-02-07 MED ORDER — INSULIN ASPART PROT & ASPART (70-30 MIX) 100 UNIT/ML ~~LOC~~ SUSP
5.0000 [IU] | Freq: Once | SUBCUTANEOUS | Status: AC
  Administered 2023-02-07: 5 [IU] via SUBCUTANEOUS
  Filled 2023-02-07: qty 10

## 2023-02-07 MED ORDER — DOXYCYCLINE HYCLATE 100 MG PO CAPS: 100.0000 mg | ORAL_CAPSULE | Freq: Two times a day (BID) | ORAL | 0 refills | Status: DC

## 2023-02-07 MED ORDER — METFORMIN HCL 500 MG PO TABS
500.0000 mg | ORAL_TABLET | Freq: Two times a day (BID) | ORAL | 0 refills | Status: DC
Start: 2023-02-07 — End: 2023-04-22

## 2023-02-07 NOTE — ED Provider Notes (Signed)
[place] EMERGENCY DEPARTMENT AT [facility] Provider Note   CSN: [id number] Arrival date & time: 02/06/23  2336     History  Chief Complaint  Patient presents with   Hyperglycemia   Skin Ulcer    Alejandro Baker is a 47 y.o. male.  The history is provided by the patient and medical records.  Hyperglycemia Alejandro Baker is a 47 y.o. male who presents to the Emergency Department complaining of foot pain.  He presents to the emergency department because his feet have been messed up for several days.  He does report subjective fever for several days.  He is currently unhoused.  He does have a history of diabetes and takes insulin but has not taken for several days.  No vomiting, diarrhea.  He does also report using tobacco and drinking alcohol.  He does not believe he drink any alcohol today, but is unsure.  He does drink alcohol occasionally.Marland Kitchen  He does report feeling very fatigued.      Home Medications Prior to Admission medications   Medication Sig Start Date End Date Taking? Authorizing Provider  albuterol (VENTOLIN HFA) 108 (90 Base) MCG/ACT inhaler Inhale 1-2 puffs into the lungs every 6 (six) hours as needed for wheezing or shortness of breath. 05/19/21   Prosperi, Christian H, PA-C  doxycycline (VIBRAMYCIN) 100 MG capsule Take 1 capsule (100 mg total) by mouth 2 (two) times daily. 02/07/23   Tilden Fossa, MD  fluticasone [facility]) 50 MCG/ACT nasal spray Place 1 spray into both nostrils daily. 05/19/21   Prosperi, Christian H, PA-C  metFORMIN (GLUCOPHAGE) 500 MG tablet Take 1 tablet (500 mg total) by mouth 2 (two) times daily with a meal. 02/07/23   Tilden Fossa, MD  risperiDONE (RISPERDAL) 2 MG tablet Take 1 tablet (2 mg total) by mouth at bedtime. 04/10/22 10/07/22  Alicia Amel, MD      Allergies    Penicillins and Povidone-iodine    Review of Systems   Review of Systems  All other systems reviewed and are negative.   Physical Exam Updated Vital  Signs BP 111/88 (BP Location: Right Arm)   Pulse 88   Temp 98.9 F (37.2 C) (Oral)   Resp 16   SpO2 97%  Physical Exam Vitals and nursing note reviewed.  Constitutional:      Appearance: He is well-developed.  HENT:     Head: Normocephalic and atraumatic.  Cardiovascular:     Rate and Rhythm: Normal rate and regular rhythm.     Heart sounds: No murmur heard. Pulmonary:     Effort: Pulmonary effort is normal. No respiratory distress.     Breath sounds: Normal breath sounds.  Abdominal:     Palpations: Abdomen is soft.     Tenderness: There is no abdominal tenderness. There is no guarding or rebound.  Musculoskeletal:     Comments: 2+ DP pulses bilaterally.  There is caked on debris and dirt to bilateral feet, which limits visualization of wounds.  No significant soft tissue edema to the feet.  Skin:    General: Skin is warm and dry.  Neurological:     Mental Status: He is alert and oriented to person, place, and time.  Psychiatric:        Behavior: Behavior normal.     ED Results / Procedures / Treatments   Labs (all labs ordered are listed, but only abnormal results are displayed) Labs Reviewed  COMPREHENSIVE METABOLIC PANEL - Abnormal; Notable for the following components:  Result Value   Potassium 3.4 (*)    Glucose, Bld 391 (*)    Total Bilirubin 0.2 (*)    All other components within normal limits  CBC WITH DIFFERENTIAL/PLATELET - Abnormal; Notable for the following components:   HCT 38.7 (*)    Abs Immature Granulocytes 0.10 (*)    All other components within normal limits  CBG MONITORING, ED - Abnormal; Notable for the following components:   Glucose-Capillary 406 (*)    All other components within normal limits  CBG MONITORING, ED - Abnormal; Notable for the following components:   Glucose-Capillary 420 (*)    All other components within normal limits  CBG MONITORING, ED - Abnormal; Notable for the following components:   Glucose-Capillary 399 (*)     All other components within normal limits  CBG MONITORING, ED - Abnormal; Notable for the following components:   Glucose-Capillary 367 (*)    All other components within normal limits  ETHANOL    EKG None  Radiology CT Head Wo Contrast  Result Date: 02/07/2023 CLINICAL DATA:  47 year old male with altered mental status. EXAM: CT HEAD WITHOUT CONTRAST TECHNIQUE: Contiguous axial images were obtained from the base of the skull through the vertex without intravenous contrast. RADIATION DOSE REDUCTION: This exam was performed according to the departmental dose-optimization program which includes automated exposure control, adjustment of the mA and/or kV according to patient size and/or use of iterative reconstruction technique. COMPARISON:  Head CT 12/07/2022 [facility] . FINDINGS: Brain: Cerebral volume is stable and within normal limits. No midline shift, ventriculomegaly, mass effect, evidence of mass lesion, intracranial hemorrhage or evidence of cortically based acute infarction. Gray-white matter differentiation is within normal limits throughout the brain. Vascular: No suspicious intracranial vascular hyperdensity. Skull: Stable and intact. Sinuses/Orbits: Visualized paranasal sinuses and mastoids are stable and well aerated. Other: Leftward gaze now.  Stable and negative scalp soft tissues. IMPRESSION: Stable and normal noncontrast Head CT. Electronically Signed   By: Odessa Fleming M.D.   On: 02/07/2023 04:52    Procedures Procedures    Medications Ordered in ED Medications  insulin aspart protamine- aspart (NOVOLOG MIX 70/30) injection 5 Units (5 Units Subcutaneous Given 02/07/23 0416)  cephALEXin (KEFLEX) capsule 500 mg (500 mg Oral Given 02/07/23 0427)    ED Course/ Medical Decision Making/ A&P                                 Medical Decision Making Amount and/or Complexity of Data Reviewed Labs: ordered. Radiology:  ordered.  Risk Prescription drug management.   Patient with history of diabetes, housing insecurity here for evaluation of foot wounds.  He is disheveled on examination and has very dirty and soiled feet.  These were cleaned by staff.  He has multiple ulcers consistent with friction blisters that have ruptured.  They do not appear infected at this time.  There is no evidence of deep tissue penetration.  There is no cellulitis.  There is no active drainage.  Blood sugars are elevated.  Patient is not compliant with his home medications.  Case management consult placed regarding medication assistance as well as housing options.  Patient is not able to relay why he is not staying in the shelter.  He is able to ambulate without difficulty in the department.  Plan to discharge with outpatient resources, will prescribe antibiotics given his diabetes and foot condition.  Return precautions discussed.        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

## 2023-02-07 NOTE — ED Notes (Signed)
Patient transported to CT 

## 2023-02-12 DIAGNOSIS — M79672 Pain in left foot: Secondary | ICD-10-CM | POA: Diagnosis not present

## 2023-02-12 DIAGNOSIS — Z5189 Encounter for other specified aftercare: Secondary | ICD-10-CM | POA: Diagnosis not present

## 2023-02-17 ENCOUNTER — Emergency Department (HOSPITAL_COMMUNITY)
Admission: EM | Admit: 2023-02-17 | Discharge: 2023-02-17 | Discharge status: Against Medical Advice | Payer: PPO | Attending: Emergency Medicine | Admitting: Emergency Medicine

## 2023-02-17 ENCOUNTER — Emergency Department (HOSPITAL_COMMUNITY)
Admission: EM | Admit: 2023-02-17 | Discharge: 2023-02-17 | Discharge status: Against Medical Advice | Payer: PPO | Source: Home / Self Care

## 2023-02-17 ENCOUNTER — Other Ambulatory Visit: Payer: Self-pay

## 2023-02-17 ENCOUNTER — Emergency Department (HOSPITAL_COMMUNITY)
Admission: EM | Admit: 2023-02-17 | Discharge: 2023-02-18 | Disposition: A | Discharge status: Home / Self Care | Payer: PPO | Attending: Emergency Medicine | Admitting: Emergency Medicine

## 2023-02-17 ENCOUNTER — Encounter (HOSPITAL_COMMUNITY): Payer: Self-pay | Admitting: Emergency Medicine

## 2023-02-17 ENCOUNTER — Emergency Department (HOSPITAL_COMMUNITY): Payer: PPO

## 2023-02-17 DIAGNOSIS — Z20822 Contact with and (suspected) exposure to covid-19: Secondary | ICD-10-CM | POA: Diagnosis not present

## 2023-02-17 DIAGNOSIS — R0602 Shortness of breath: Secondary | ICD-10-CM | POA: Insufficient documentation

## 2023-02-17 DIAGNOSIS — J069 Acute upper respiratory infection, unspecified: Secondary | ICD-10-CM | POA: Diagnosis not present

## 2023-02-17 DIAGNOSIS — M79672 Pain in left foot: Secondary | ICD-10-CM | POA: Diagnosis not present

## 2023-02-17 DIAGNOSIS — B9789 Other viral agents as the cause of diseases classified elsewhere: Secondary | ICD-10-CM | POA: Diagnosis not present

## 2023-02-17 DIAGNOSIS — Z7984 Long term (current) use of oral hypoglycemic drugs: Secondary | ICD-10-CM | POA: Diagnosis not present

## 2023-02-17 DIAGNOSIS — I10 Essential (primary) hypertension: Secondary | ICD-10-CM | POA: Diagnosis not present

## 2023-02-17 DIAGNOSIS — Z5900 Homelessness unspecified: Secondary | ICD-10-CM | POA: Insufficient documentation

## 2023-02-17 DIAGNOSIS — M542 Cervicalgia: Secondary | ICD-10-CM | POA: Insufficient documentation

## 2023-02-17 DIAGNOSIS — M79673 Pain in unspecified foot: Secondary | ICD-10-CM | POA: Diagnosis not present

## 2023-02-17 DIAGNOSIS — Z5321 Procedure and treatment not carried out due to patient leaving prior to being seen by health care provider: Secondary | ICD-10-CM | POA: Insufficient documentation

## 2023-02-17 DIAGNOSIS — R739 Hyperglycemia, unspecified: Secondary | ICD-10-CM | POA: Diagnosis not present

## 2023-02-17 HISTORY — DX: Homelessness unspecified: Z59.00

## 2023-02-17 NOTE — ED Triage Notes (Signed)
Pt in with reported sob since this afternoon. NAD noted in triage

## 2023-02-17 NOTE — ED Triage Notes (Signed)
Patient arrived with EMS from street ( homeless) reports left foot pain this evening /ambulatory.

## 2023-02-17 NOTE — ED Notes (Signed)
Pt left to the cafeteria

## 2023-02-17 NOTE — ED Notes (Signed)
Pt called x3 in lobby with no response. Moved OTF.

## 2023-02-18 ENCOUNTER — Emergency Department (HOSPITAL_COMMUNITY): Payer: PPO

## 2023-02-18 ENCOUNTER — Emergency Department (HOSPITAL_COMMUNITY)
Admission: EM | Admit: 2023-02-18 | Discharge: 2023-02-18 | Disposition: A | Discharge status: Home / Self Care | Payer: PPO | Attending: Emergency Medicine | Admitting: Emergency Medicine

## 2023-02-18 ENCOUNTER — Emergency Department (HOSPITAL_COMMUNITY)
Admission: EM | Admit: 2023-02-18 | Discharge: 2023-02-19 | Disposition: A | Discharge status: Home / Self Care | Payer: PPO | Attending: Emergency Medicine | Admitting: Emergency Medicine

## 2023-02-18 ENCOUNTER — Other Ambulatory Visit (HOSPITAL_COMMUNITY): Payer: Self-pay

## 2023-02-18 ENCOUNTER — Encounter (HOSPITAL_COMMUNITY): Payer: Self-pay

## 2023-02-18 DIAGNOSIS — B9789 Other viral agents as the cause of diseases classified elsewhere: Secondary | ICD-10-CM | POA: Insufficient documentation

## 2023-02-18 DIAGNOSIS — Z5900 Homelessness unspecified: Secondary | ICD-10-CM | POA: Insufficient documentation

## 2023-02-18 DIAGNOSIS — Z20822 Contact with and (suspected) exposure to covid-19: Secondary | ICD-10-CM | POA: Diagnosis not present

## 2023-02-18 DIAGNOSIS — J069 Acute upper respiratory infection, unspecified: Secondary | ICD-10-CM | POA: Insufficient documentation

## 2023-02-18 DIAGNOSIS — M542 Cervicalgia: Secondary | ICD-10-CM | POA: Insufficient documentation

## 2023-02-18 DIAGNOSIS — Z7984 Long term (current) use of oral hypoglycemic drugs: Secondary | ICD-10-CM | POA: Insufficient documentation

## 2023-02-18 DIAGNOSIS — R0602 Shortness of breath: Secondary | ICD-10-CM | POA: Diagnosis not present

## 2023-02-18 DIAGNOSIS — R059 Cough, unspecified: Secondary | ICD-10-CM | POA: Diagnosis present

## 2023-02-18 HISTORY — DX: Unspecified asthma, uncomplicated: J45.909

## 2023-02-18 LAB — RESP PANEL BY RT-PCR (RSV, FLU A&B, COVID)  RVPGX2
Influenza A by PCR: NEGATIVE
Influenza B by PCR: NEGATIVE
Resp Syncytial Virus by PCR: NEGATIVE
SARS Coronavirus 2 by RT PCR: NEGATIVE

## 2023-02-18 MED ORDER — BENZONATATE 100 MG PO CAPS
100.0000 mg | ORAL_CAPSULE | Freq: Three times a day (TID) | ORAL | 0 refills | Status: DC
Start: 2023-02-18 — End: 2023-05-19

## 2023-02-18 MED ORDER — GUAIFENESIN-DM 100-10 MG/5ML PO SYRP
5.0000 mL | ORAL_SOLUTION | Freq: Once | ORAL | Status: AC
  Administered 2023-02-18: 5 mL via ORAL
  Filled 2023-02-18: qty 5

## 2023-02-18 NOTE — ED Notes (Signed)
Pt. Requested [place] sandwich. Told pt. To refrain from eating and drinking until seen by provider.

## 2023-02-18 NOTE — ED Triage Notes (Signed)
Pt presents to ED for 6 months of neck pain, upon entering triage RM pt sleeping in chair, pt also seen earlier today for flu-like symptoms. Pt reports is homeless and needs place to stay, working on getting an apartment. No injury to neck, reports it just feels sore when I move it. NAD noted, A&O x4.

## 2023-02-18 NOTE — ED Notes (Signed)
Pt in NAD at d/c from ED. A&O. Ambulatory. Respirations even & unlabored. Skin warm & Dry. Pt verbalized understanding of d/c teaching including follow up care and reasons to return to the ED. No needs or questions expressed at d/c.

## 2023-02-18 NOTE — ED Notes (Signed)
This RN reviewed discharge instructions with patient. He verbalized understanding and denied any further questions. PT well appearing upon discharge and ambulatory. Pt ambulated with stable gait to exit. Pt endorses ride home.

## 2023-02-18 NOTE — Discharge Instructions (Signed)
As we discussed, your COVID, flu, and RSV swab is pending at your discharge.  Please monitor your MyChart online for these results.  Given that your symptoms appear to be mild, there is no indication for you to remain in the department to have these test results as these are all viral illnesses and no antibiotics are indicated.  I recommend that you get plenty of rest and focus on symptomatic relief which includes Cepacol throat lozenges for sore throat, Mucinex D (orange box) which you can get from behind the counter at your local pharmacy for congestion, and tylenol/ibuprofen as needed for fevers and bodyaches. I have also given you a prescription for Tessalon which is a cough suppressant medication for you to take as prescribed as needed for management of your symptoms. I also recommend:  Increased fluid intake. Sports drinks offer valuable electrolytes, sugars, and fluids.  Breathing heated mist or steam (vaporizer or shower).  Eating chicken soup or other clear broths, and maintaining good nutrition.   Increasing usage of your inhaler if you have asthma.  Return to work when your temperature has returned to normal.  Gargle warm salt water and spit it out for sore throat. Take benadryl or Zyrtec to decrease sinus secretions.  Follow Up: Follow up with your primary care doctor in 5-7 days for recheck of ongoing symptoms.  Return to emergency department for emergent changing or worsening of symptoms.

## 2023-02-18 NOTE — ED Provider Notes (Signed)
[place] EMERGENCY DEPARTMENT AT [facility] Provider Note   CSN: [id number] Arrival date & time: 02/18/23  5284     History  Chief Complaint  Patient presents with   Nasal Congestion   flu like symptoms    Alejandro Baker is a 47 y.o. male.  Patient with history of polysubstance abuse, schizoaffective disorder, malingering, and homelessness returns today with complaints of cough and congestion.  He states that same began 3 days ago and has been persistent since then.  He has not tried any medications for his symptoms.  Patient was just here a few hours ago with similar symptoms and had a negative chest x-ray, denies any change from this visit.  He does also note that his throat is sore, however he is able to swallow without issue.  Denies chest pain.  Patient also requesting a sandwich and a drink.  The history is provided by the patient. No language interpreter was used.       Home Medications Prior to Admission medications   Medication Sig Start Date End Date Taking? Authorizing Provider  albuterol (VENTOLIN HFA) 108 (90 Base) MCG/ACT inhaler Inhale 1-2 puffs into the lungs every 6 (six) hours as needed for wheezing or shortness of breath. 05/19/21   Prosperi, Christian H, PA-C  doxycycline (VIBRAMYCIN) 100 MG capsule Take 1 capsule (100 mg total) by mouth 2 (two) times daily. 02/07/23   Tilden Fossa, MD  fluticasone [facility]) 50 MCG/ACT nasal spray Place 1 spray into both nostrils daily. 05/19/21   Prosperi, Christian H, PA-C  metFORMIN (GLUCOPHAGE) 500 MG tablet Take 1 tablet (500 mg total) by mouth 2 (two) times daily with a meal. 02/07/23   Tilden Fossa, MD  risperiDONE (RISPERDAL) 2 MG tablet Take 1 tablet (2 mg total) by mouth at bedtime. 04/10/22 10/07/22  Alicia Amel, MD      Allergies    Penicillins and Povidone-iodine    Review of Systems   Review of Systems  HENT:  Positive for congestion.   Respiratory:  Positive for cough.   All other  systems reviewed and are negative.   Physical Exam Updated Vital Signs BP (!) 140/99   Pulse 73   Temp 97.6 F (36.4 C) (Oral)   Resp 16   Ht 6' (1.829 m)   Wt 88 kg   SpO2 100%   BMI 26.31 kg/m  Physical Exam Vitals and nursing note reviewed.  Constitutional:      General: He is not in acute distress.    Appearance: Normal appearance. He is normal weight. He is not ill-appearing, toxic-appearing or diaphoretic.     Comments: Upon initial evaluation, patient resting comfortably in bed laying flat with a mask on in no distress.  HENT:     Head: Normocephalic and atraumatic.     Mouth/Throat:     Lips: Pink.     Pharynx: Oropharynx is clear. Uvula midline. No pharyngeal swelling, oropharyngeal exudate, posterior oropharyngeal erythema, uvula swelling or postnasal drip.     Tonsils: No tonsillar exudate or tonsillar abscesses. 2+ on the right. 2+ on the left.  Cardiovascular:     Rate and Rhythm: Normal rate and regular rhythm.     Heart sounds: Normal heart sounds.  Pulmonary:     Effort: Pulmonary effort is normal. No respiratory distress.     Breath sounds: Normal breath sounds.     Comments: Speaking in complete sentences Abdominal:     General: Abdomen is flat.  Palpations: Abdomen is soft.     Tenderness: There is no abdominal tenderness.  Musculoskeletal:        General: No tenderness. Normal range of motion.     Cervical back: Normal range of motion and neck supple.     Right lower leg: No edema.     Left lower leg: No edema.  Lymphadenopathy:     Cervical: No cervical adenopathy.  Skin:    General: Skin is warm and dry.  Neurological:     General: No focal deficit present.     Mental Status: He is alert.  Psychiatric:        Mood and Affect: Mood normal.        Behavior: Behavior normal.     ED Results / Procedures / Treatments   Labs (all labs ordered are listed, but only abnormal results are displayed) Labs Reviewed  RESP PANEL BY RT-PCR (RSV,  FLU A&B, COVID)  RVPGX2    EKG None  Radiology DG Chest 2 View  Result Date: 02/18/2023 CLINICAL DATA:  Shortness of breath EXAM: CHEST - 2 VIEW COMPARISON:  11/18/2022 FINDINGS: The heart size and mediastinal contours are within normal limits. Both lungs are clear. The visualized skeletal structures are unremarkable. IMPRESSION: No active cardiopulmonary disease. Electronically Signed   By: Alcide Clever M.D.   On: 02/18/2023 01:35    Procedures Procedures    Medications Ordered in ED Medications  guaiFENesin-dextromethorphan (ROBITUSSIN DM) 100-10 MG/5ML syrup 5 mL (has no administration in time range)    ED Course/ Medical Decision Making/ A&P                                 Medical Decision Making Risk OTC drugs.   Patient returns today with complaints of cough and congestion x 3 days.  He is afebrile, nontoxic-appearing, and in no acute distress reassuring vital signs.  Physical exam reveals lung sounds clear to auscultation in all fields.  Patient speaking in complete sentences with normal phonation and tolerating secretions without issue.  No tonsillar swelling or exudate.  No concern for PTA or RPA.  No cervical lymphadenopathy.  Chart reviewed, patient here less than 5 hours ago with similar symptoms and had a negative chest x-ray and reassuring EKG.  Patient given Robitussin with symptomatic improvement.  Also given a sandwich and a drink per his request.  Patient swabbed for COVID and the flu, however given his reassuring exam, no indication for the patient to remain in the department for these test result.  I have advised him to monitor his MyChart online for these test results. Will send for tessalon for symptoms as well as OTC recommendations. Evaluation and diagnostic testing in the emergency department does not suggest an emergent condition requiring admission or immediate intervention beyond what has been performed at this time.  Plan for discharge with close PCP  follow-up.  Patient is understanding and amenable with plan, educated on red flag symptoms that would prompt immediate return.  Patient discharged in stable condition.  Final Clinical Impression(s) / ED Diagnoses Final diagnoses:  Viral URI with cough    Rx / DC Orders ED Discharge Orders          Ordered    benzonatate (TESSALON) 100 MG capsule  Every 8 hours        02/18/23 0840          An After Visit Summary was printed  and given to the patient.     Vear Clock 02/18/23 3086    Laurence Spates, MD 02/19/23 [phone]

## 2023-02-18 NOTE — ED Provider Notes (Signed)
[place] EMERGENCY DEPARTMENT AT [facility] Provider Note   CSN: [id number] Arrival date & time: 02/17/23  2325     History  Chief Complaint  Patient presents with   Shortness of Breath    Alejandro Baker is a 47 y.o. male.  The history is provided by the patient and medical records.  Shortness of Breath  47 year old male presenting to the ED with reported shortness of breath.  He denies any associated chest pain.  No cough or fever.  No other upper respiratory symptoms.  No history of asthma.  Also requesting a sandwich.  Home Medications Prior to Admission medications   Medication Sig Start Date End Date Taking? Authorizing Provider  albuterol (VENTOLIN HFA) 108 (90 Base) MCG/ACT inhaler Inhale 1-2 puffs into the lungs every 6 (six) hours as needed for wheezing or shortness of breath. 05/19/21   Prosperi, Christian H, PA-C  doxycycline (VIBRAMYCIN) 100 MG capsule Take 1 capsule (100 mg total) by mouth 2 (two) times daily. 02/07/23   Tilden Fossa, MD  fluticasone [facility]) 50 MCG/ACT nasal spray Place 1 spray into both nostrils daily. 05/19/21   Prosperi, Christian H, PA-C  metFORMIN (GLUCOPHAGE) 500 MG tablet Take 1 tablet (500 mg total) by mouth 2 (two) times daily with a meal. 02/07/23   Tilden Fossa, MD  risperiDONE (RISPERDAL) 2 MG tablet Take 1 tablet (2 mg total) by mouth at bedtime. 04/10/22 10/07/22  Alicia Amel, MD      Allergies    Penicillins and Povidone-iodine    Review of Systems   Review of Systems  Respiratory:  Positive for shortness of breath.   All other systems reviewed and are negative.   Physical Exam Updated Vital Signs BP (!) 142/94   Pulse 71   Temp 98.4 F (36.9 C) (Oral)   Resp 18   Wt 81.3 kg   SpO2 100%   BMI 24.30 kg/m  Physical Exam Vitals and nursing note reviewed.  Constitutional:      Appearance: He is well-developed.  HENT:     Head: Normocephalic and atraumatic.  Eyes:     Conjunctiva/sclera:  Conjunctivae normal.     Pupils: Pupils are equal, round, and reactive to light.  Cardiovascular:     Rate and Rhythm: Normal rate and regular rhythm.     Heart sounds: Normal heart sounds.  Pulmonary:     Effort: Pulmonary effort is normal.     Breath sounds: Normal breath sounds. No wheezing or rhonchi.  Abdominal:     General: Bowel sounds are normal.     Palpations: Abdomen is soft.  Musculoskeletal:        General: Normal range of motion.     Cervical back: Normal range of motion.  Skin:    General: Skin is warm and dry.  Neurological:     Mental Status: He is alert and oriented to person, place, and time.     ED Results / Procedures / Treatments   Labs (all labs ordered are listed, but only abnormal results are displayed) Labs Reviewed - No data to display  EKG None  Radiology DG Chest 2 View  Result Date: 02/18/2023 CLINICAL DATA:  Shortness of breath EXAM: CHEST - 2 VIEW COMPARISON:  11/18/2022 FINDINGS: The heart size and mediastinal contours are within normal limits. Both lungs are clear. The visualized skeletal structures are unremarkable. IMPRESSION: No active cardiopulmonary disease. Electronically Signed   By: Alcide Clever M.D.   On: 02/18/2023 01:35  Procedures Procedures    Medications Ordered in ED Medications - No data to display  ED Course/ Medical Decision Making/ A&P                                 Medical Decision Making Amount and/or Complexity of Data Reviewed Radiology: ordered and independent interpretation performed.   47 year old male presenting to the ED with shortness of breath.  No associated symptoms.  He is afebrile and nontoxic.  His lungs are clear without any wheezes or rhonchi.  Chest x-ray obtained from triage and reviewed, no acute findings.  Vitals are stable.  Given reassuring exam do not feel he needs further workup at this time.  He requested sandwich and drink which were given.  Stable for discharge.  Can return here for  new concerns.  Final Clinical Impression(s) / ED Diagnoses Final diagnoses:  Shortness of breath    Rx / DC Orders ED Discharge Orders     None         Garlon Hatchet, PA-C 02/18/23 0324    Sabas Sous, MD 02/18/23 [phone]

## 2023-02-18 NOTE — Discharge Instructions (Signed)
Your chest x-ray today was normal. You can return here for any new or acute changes.

## 2023-02-18 NOTE — ED Triage Notes (Signed)
Pt. Presented to ed with flu like symptoms. Pt. States symptoms started 3 days ago. Pt. Says he is having sob and coughing as well as sore throat. Pt. A&OX4.

## 2023-02-19 NOTE — ED Provider Notes (Signed)
[place] EMERGENCY DEPARTMENT AT [facility] Provider Note   CSN: [id number] Arrival date & time: 02/18/23  2047     History  Chief Complaint  Patient presents with   Neck Pain    Alejandro Baker is a 47 y.o. male.  The history is provided by the patient and medical records.  Neck Pain  47 year old male with history of polysubstance abuse, schizophrenia, homelessness, presenting to the ED with neck pain.  Patient reports has been ongoing for about 6 months.  He denies any new injury, trauma, or falls.  He has no numbness or weakness of the arms or legs.  No intervention tried PTA.  Home Medications Prior to Admission medications   Medication Sig Start Date End Date Taking? Authorizing Provider  albuterol (VENTOLIN HFA) 108 (90 Base) MCG/ACT inhaler Inhale 1-2 puffs into the lungs every 6 (six) hours as needed for wheezing or shortness of breath. 05/19/21   Prosperi, Christian H, PA-C  benzonatate (TESSALON) 100 MG capsule Take 1 capsule (100 mg total) by mouth every 8 (eight) hours. 02/18/23   Smoot, Shawn Route, PA-C  doxycycline (VIBRAMYCIN) 100 MG capsule Take 1 capsule (100 mg total) by mouth 2 (two) times daily. 02/07/23   Tilden Fossa, MD  fluticasone [facility]) 50 MCG/ACT nasal spray Place 1 spray into both nostrils daily. 05/19/21   Prosperi, Christian H, PA-C  metFORMIN (GLUCOPHAGE) 500 MG tablet Take 1 tablet (500 mg total) by mouth 2 (two) times daily with a meal. 02/07/23   Tilden Fossa, MD  risperiDONE (RISPERDAL) 2 MG tablet Take 1 tablet (2 mg total) by mouth at bedtime. 04/10/22 10/07/22  Alicia Amel, MD      Allergies    Penicillins and Povidone-iodine    Review of Systems   Review of Systems  Musculoskeletal:  Positive for neck pain.  All other systems reviewed and are negative.   Physical Exam Updated Vital Signs BP 110/78 (BP Location: Right Arm)   Pulse 67   Temp 97.8 F (36.6 C) (Oral)   Resp 18   Ht 6' (1.829 m)   Wt 88 kg   SpO2  100%   BMI 26.31 kg/m  Physical Exam Vitals and nursing note reviewed.  Constitutional:      Appearance: He is well-developed.  HENT:     Head: Normocephalic and atraumatic.  Eyes:     Conjunctiva/sclera: Conjunctivae normal.     Pupils: Pupils are equal, round, and reactive to light.  Cardiovascular:     Rate and Rhythm: Normal rate and regular rhythm.     Heart sounds: Normal heart sounds.  Pulmonary:     Effort: Pulmonary effort is normal. No respiratory distress.     Breath sounds: Normal breath sounds. No rhonchi.  Musculoskeletal:        General: Normal range of motion.     Cervical back: Normal range of motion.  Skin:    General: Skin is warm and dry.  Neurological:     Mental Status: He is alert and oriented to person, place, and time.     ED Results / Procedures / Treatments   Labs (all labs ordered are listed, but only abnormal results are displayed) Labs Reviewed - No data to display  EKG None  Radiology DG Chest 2 View  Result Date: 02/18/2023 CLINICAL DATA:  Shortness of breath EXAM: CHEST - 2 VIEW COMPARISON:  11/18/2022 FINDINGS: The heart size and mediastinal contours are within normal limits. Both lungs are clear. The  visualized skeletal structures are unremarkable. IMPRESSION: No active cardiopulmonary disease. Electronically Signed   By: Alcide Clever M.D.   On: 02/18/2023 01:35    Procedures Procedures    Medications Ordered in ED Medications - No data to display  ED Course/ Medical Decision Making/ A&P                                 Medical Decision Making  47 year old male presenting to the ED with neck pain.  Ongoing for several months now.  No new injury, trauma, or falls.  He is moving his neck easily on exam.  He has no rigidity.  No neurologic deficits.  Do not feel he needs emergent imaging at this time.  Will discharge home with symptomatic care.  Can follow-up with PCP.  Return here for new concerns.  Final Clinical Impression(s)  / ED Diagnoses Final diagnoses:  Neck pain    Rx / DC Orders ED Discharge Orders     None         Garlon Hatchet, PA-C 02/19/23 1607    Sloan Leiter, DO 02/19/23 [phone]

## 2023-02-19 NOTE — Discharge Instructions (Signed)
Can take tylenol or motrin as needed for neck pain. Return here for new concerns.

## 2023-02-20 ENCOUNTER — Emergency Department (HOSPITAL_COMMUNITY): Admission: EM | Admit: 2023-02-20 | Discharge: 2023-02-20 | Discharge status: Against Medical Advice | Payer: PPO

## 2023-02-20 ENCOUNTER — Other Ambulatory Visit: Payer: Self-pay

## 2023-02-20 ENCOUNTER — Encounter (HOSPITAL_COMMUNITY): Payer: Self-pay

## 2023-02-20 ENCOUNTER — Emergency Department (HOSPITAL_COMMUNITY)
Admission: EM | Admit: 2023-02-20 | Discharge: 2023-02-20 | Disposition: A | Discharge status: Home / Self Care | Payer: PPO | Attending: Emergency Medicine | Admitting: Emergency Medicine

## 2023-02-20 ENCOUNTER — Emergency Department (HOSPITAL_COMMUNITY): Payer: PPO

## 2023-02-20 DIAGNOSIS — R0789 Other chest pain: Secondary | ICD-10-CM | POA: Diagnosis not present

## 2023-02-20 DIAGNOSIS — R739 Hyperglycemia, unspecified: Secondary | ICD-10-CM | POA: Diagnosis not present

## 2023-02-20 DIAGNOSIS — R079 Chest pain, unspecified: Secondary | ICD-10-CM | POA: Diagnosis not present

## 2023-02-20 DIAGNOSIS — Z7984 Long term (current) use of oral hypoglycemic drugs: Secondary | ICD-10-CM | POA: Diagnosis not present

## 2023-02-20 DIAGNOSIS — R0602 Shortness of breath: Secondary | ICD-10-CM | POA: Diagnosis not present

## 2023-02-20 DIAGNOSIS — Z5900 Homelessness unspecified: Secondary | ICD-10-CM | POA: Insufficient documentation

## 2023-02-20 DIAGNOSIS — R5383 Other fatigue: Secondary | ICD-10-CM | POA: Insufficient documentation

## 2023-02-20 DIAGNOSIS — R1013 Epigastric pain: Secondary | ICD-10-CM | POA: Insufficient documentation

## 2023-02-20 DIAGNOSIS — R059 Cough, unspecified: Secondary | ICD-10-CM | POA: Insufficient documentation

## 2023-02-20 LAB — I-STAT CHEM 8, ED
BUN: 18 mg/dL (ref 6–20)
Calcium, Ion: 1.12 mmol/L — ABNORMAL LOW (ref 1.15–1.40)
Chloride: 102 mmol/L (ref 98–111)
Creatinine, Ser: 0.7 mg/dL (ref 0.61–1.24)
Glucose, Bld: 223 mg/dL — ABNORMAL HIGH (ref 70–99)
HCT: 41 % (ref 39.0–52.0)
Hemoglobin: 13.9 g/dL (ref 13.0–17.0)
Potassium: 3.8 mmol/L (ref 3.5–5.1)
Sodium: 137 mmol/L (ref 135–145)
TCO2: 23 mmol/L (ref 22–32)

## 2023-02-20 LAB — TROPONIN I (HIGH SENSITIVITY): Troponin I (High Sensitivity): 6 ng/L (ref <18)

## 2023-02-20 NOTE — ED Triage Notes (Signed)
Pt bib ems c.o not feeling well, sob, chest discomfort, d.c on 10/21 for the same, pt warm to touch. Pt has been sitting outside of [facility] this morning when he was picked up.

## 2023-02-20 NOTE — ED Provider Notes (Signed)
[place] EMERGENCY DEPARTMENT AT [facility] Provider Note   CSN: [id number] Arrival date & time: 02/20/23  6644     History  Chief Complaint  Patient presents with   Fatigue   Shortness of Breath    Alejandro Baker is a 47 y.o. male.  47 year old male who presents with sharp epigastric chest pain.  Patient states that this has been there for quite some time.  He states he has had a slight cough.  He denies being short of breath.  Patient seen recently 2 days ago for neck discomfort and workup was negative.  Patient is very poor historian at this time.  Patient is homeless and called from outside the pharmacy when he was picked up.       Home Medications Prior to Admission medications   Medication Sig Start Date End Date Taking? Authorizing Provider  albuterol (VENTOLIN HFA) 108 (90 Base) MCG/ACT inhaler Inhale 1-2 puffs into the lungs every 6 (six) hours as needed for wheezing or shortness of breath. 05/19/21   Prosperi, Christian H, PA-C  benzonatate (TESSALON) 100 MG capsule Take 1 capsule (100 mg total) by mouth every 8 (eight) hours. 02/18/23   Smoot, Shawn Route, PA-C  doxycycline (VIBRAMYCIN) 100 MG capsule Take 1 capsule (100 mg total) by mouth 2 (two) times daily. 02/07/23   Tilden Fossa, MD  fluticasone [facility]) 50 MCG/ACT nasal spray Place 1 spray into both nostrils daily. 05/19/21   Prosperi, Christian H, PA-C  metFORMIN (GLUCOPHAGE) 500 MG tablet Take 1 tablet (500 mg total) by mouth 2 (two) times daily with a meal. 02/07/23   Tilden Fossa, MD  risperiDONE (RISPERDAL) 2 MG tablet Take 1 tablet (2 mg total) by mouth at bedtime. 04/10/22 10/07/22  Alicia Amel, MD      Allergies    Penicillins and Povidone-iodine    Review of Systems   Review of Systems  Unable to perform ROS: Other    Physical Exam Updated Vital Signs BP 126/77   Pulse 66   Temp 98 F (36.7 C) (Oral)   Resp 15   Ht 1.829 m (6')   Wt 88 kg   SpO2 99%   BMI 26.31 kg/m   Physical Exam Vitals and nursing note reviewed.  Constitutional:      General: He is not in acute distress.    Appearance: Normal appearance. He is well-developed. He is not toxic-appearing.  HENT:     Head: Normocephalic and atraumatic.  Eyes:     General: Lids are normal.     Conjunctiva/sclera: Conjunctivae normal.     Pupils: Pupils are equal, round, and reactive to light.  Neck:     Thyroid: No thyroid mass.     Trachea: No tracheal deviation.  Cardiovascular:     Rate and Rhythm: Normal rate and regular rhythm.     Heart sounds: Normal heart sounds. No murmur heard.    No gallop.  Pulmonary:     Effort: Pulmonary effort is normal. No respiratory distress.     Breath sounds: Normal breath sounds. No stridor. No decreased breath sounds, wheezing, rhonchi or rales.  Abdominal:     General: There is no distension.     Palpations: Abdomen is soft.     Tenderness: There is no abdominal tenderness. There is no rebound.  Musculoskeletal:        General: No tenderness. Normal range of motion.     Cervical back: Normal range of motion and neck supple.  Skin:    General: Skin is warm and dry.     Findings: No abrasion or rash.  Neurological:     Mental Status: He is alert and oriented to person, place, and time. Mental status is at baseline.     GCS: GCS eye subscore is 4. GCS verbal subscore is 5. GCS motor subscore is 6.     Cranial Nerves: Cranial nerves are intact. No cranial nerve deficit.     Sensory: No sensory deficit.     Motor: Motor function is intact.  Psychiatric:        Attention and Perception: He is inattentive.        Mood and Affect: Affect is flat.        Speech: Speech normal.        Behavior: Behavior normal.     ED Results / Procedures / Treatments   Labs (all labs ordered are listed, but only abnormal results are displayed) Labs Reviewed  I-STAT CHEM 8, ED  TROPONIN I (HIGH SENSITIVITY)    EKG None  Radiology No results  found.  Procedures Procedures    Medications Ordered in ED Medications - No data to display  ED Course/ Medical Decision Making/ A&P                                 Medical Decision Making Amount and/or Complexity of Data Reviewed Radiology: ordered. ECG/medicine tests: ordered.   Patient is EKG per interpretation shows no acute ischemic changes.  He is in sinus rhythm.  Chest x-ray per my interpretation shows no acute findings.  Troponin negative.  He has had pain which has been persistent for several days.  Low suspicion for ACS, PE, dissection.  Patient stable for discharge        Final Clinical Impression(s) / ED Diagnoses Final diagnoses:  None    Rx / DC Orders ED Discharge Orders     None         Lorre Nick, MD 02/20/23 1138

## 2023-02-21 ENCOUNTER — Encounter (HOSPITAL_COMMUNITY): Payer: Self-pay

## 2023-02-21 ENCOUNTER — Emergency Department (HOSPITAL_COMMUNITY)
Admission: EM | Admit: 2023-02-21 | Discharge: 2023-02-21 | Discharge status: Against Medical Advice | Payer: PPO | Attending: Emergency Medicine | Admitting: Emergency Medicine

## 2023-02-21 ENCOUNTER — Other Ambulatory Visit: Payer: Self-pay

## 2023-02-21 DIAGNOSIS — Z5321 Procedure and treatment not carried out due to patient leaving prior to being seen by health care provider: Secondary | ICD-10-CM | POA: Diagnosis not present

## 2023-02-21 DIAGNOSIS — M542 Cervicalgia: Secondary | ICD-10-CM | POA: Insufficient documentation

## 2023-02-21 NOTE — ED Triage Notes (Signed)
Reports neck pain x 1.5 week. Patient is from the [facility] and has been here several times for different complaints.

## 2023-03-08 ENCOUNTER — Other Ambulatory Visit: Payer: Self-pay

## 2023-03-08 ENCOUNTER — Encounter (HOSPITAL_COMMUNITY): Payer: Self-pay | Admitting: *Deleted

## 2023-03-08 ENCOUNTER — Emergency Department (HOSPITAL_COMMUNITY)
Admission: EM | Admit: 2023-03-08 | Discharge: 2023-03-09 | Disposition: A | Discharge status: Home / Self Care | Payer: PPO | Attending: Emergency Medicine | Admitting: Emergency Medicine

## 2023-03-08 DIAGNOSIS — M79604 Pain in right leg: Secondary | ICD-10-CM | POA: Insufficient documentation

## 2023-03-08 DIAGNOSIS — M79605 Pain in left leg: Secondary | ICD-10-CM | POA: Diagnosis not present

## 2023-03-08 DIAGNOSIS — Z23 Encounter for immunization: Secondary | ICD-10-CM | POA: Diagnosis not present

## 2023-03-08 DIAGNOSIS — Z7984 Long term (current) use of oral hypoglycemic drugs: Secondary | ICD-10-CM | POA: Insufficient documentation

## 2023-03-08 DIAGNOSIS — I10 Essential (primary) hypertension: Secondary | ICD-10-CM | POA: Insufficient documentation

## 2023-03-08 DIAGNOSIS — M7732 Calcaneal spur, left foot: Secondary | ICD-10-CM | POA: Diagnosis not present

## 2023-03-08 DIAGNOSIS — S92534A Nondisplaced fracture of distal phalanx of right lesser toe(s), initial encounter for closed fracture: Secondary | ICD-10-CM | POA: Insufficient documentation

## 2023-03-08 DIAGNOSIS — S92535A Nondisplaced fracture of distal phalanx of left lesser toe(s), initial encounter for closed fracture: Secondary | ICD-10-CM | POA: Diagnosis not present

## 2023-03-08 DIAGNOSIS — M7989 Other specified soft tissue disorders: Secondary | ICD-10-CM | POA: Diagnosis not present

## 2023-03-08 DIAGNOSIS — W450XXA Nail entering through skin, initial encounter: Secondary | ICD-10-CM | POA: Insufficient documentation

## 2023-03-08 DIAGNOSIS — M7731 Calcaneal spur, right foot: Secondary | ICD-10-CM | POA: Diagnosis not present

## 2023-03-08 DIAGNOSIS — E1140 Type 2 diabetes mellitus with diabetic neuropathy, unspecified: Secondary | ICD-10-CM | POA: Diagnosis not present

## 2023-03-08 NOTE — ED Triage Notes (Signed)
Bi-lateral leg pain for 3 days  no known injury

## 2023-03-09 ENCOUNTER — Other Ambulatory Visit: Payer: Self-pay

## 2023-03-09 ENCOUNTER — Emergency Department (HOSPITAL_COMMUNITY)
Admission: EM | Admit: 2023-03-09 | Discharge: 2023-03-10 | Disposition: A | Discharge status: Home / Self Care | Payer: PPO | Source: Home / Self Care | Attending: Emergency Medicine | Admitting: Emergency Medicine

## 2023-03-09 DIAGNOSIS — S92534A Nondisplaced fracture of distal phalanx of right lesser toe(s), initial encounter for closed fracture: Secondary | ICD-10-CM | POA: Insufficient documentation

## 2023-03-09 DIAGNOSIS — Z23 Encounter for immunization: Secondary | ICD-10-CM | POA: Insufficient documentation

## 2023-03-09 DIAGNOSIS — W450XXA Nail entering through skin, initial encounter: Secondary | ICD-10-CM | POA: Insufficient documentation

## 2023-03-09 DIAGNOSIS — S92535A Nondisplaced fracture of distal phalanx of left lesser toe(s), initial encounter for closed fracture: Secondary | ICD-10-CM | POA: Diagnosis not present

## 2023-03-09 NOTE — Discharge Instructions (Signed)
Your disrespectful tone is not welcome here.

## 2023-03-09 NOTE — ED Notes (Signed)
Patient agitated and verbally aggressive at time of discharge

## 2023-03-09 NOTE — ED Triage Notes (Signed)
Pt stepped on something today and says he has puncture wound to foot. No obvious injury to left foot. Pt is ambulatory. Some redness noted to foot.

## 2023-03-09 NOTE — ED Provider Notes (Signed)
[place] EMERGENCY DEPARTMENT AT [facility] Provider Note   CSN: [id number] Arrival date & time: 03/08/23  2218     History  Chief Complaint  Patient presents with   Leg Pain    Alejandro Baker is a 47 y.o. male.  The history is provided by the patient and medical records. No language interpreter was used.  Leg Pain    47 year old male with significant history of homelessness, malingering, schizophrenia, polysubstance abuse diabetes, hypertension, neuropathy, complaining of leg pain.  As I approached patient to talk to him he immediately using explicit language and profanity.  History is limited due to patient being verbally aggressive.  EMR reviewed patient is well-known to the department who has been seen in the ED multiple times for different complaints usually with unremarkable workup.  Home Medications Prior to Admission medications   Medication Sig Start Date End Date Taking? Authorizing Provider  albuterol (VENTOLIN HFA) 108 (90 Base) MCG/ACT inhaler Inhale 1-2 puffs into the lungs every 6 (six) hours as needed for wheezing or shortness of breath. 05/19/21   Prosperi, Christian H, PA-C  benzonatate (TESSALON) 100 MG capsule Take 1 capsule (100 mg total) by mouth every 8 (eight) hours. 02/18/23   Smoot, Shawn Route, PA-C  doxycycline (VIBRAMYCIN) 100 MG capsule Take 1 capsule (100 mg total) by mouth 2 (two) times daily. 02/07/23   Tilden Fossa, MD  fluticasone [facility]) 50 MCG/ACT nasal spray Place 1 spray into both nostrils daily. 05/19/21   Prosperi, Christian H, PA-C  metFORMIN (GLUCOPHAGE) 500 MG tablet Take 1 tablet (500 mg total) by mouth 2 (two) times daily with a meal. 02/07/23   Tilden Fossa, MD  risperiDONE (RISPERDAL) 2 MG tablet Take 1 tablet (2 mg total) by mouth at bedtime. 04/10/22 10/07/22  Alicia Amel, MD      Allergies    Penicillins and Povidone-iodine    Review of Systems   Review of Systems  Physical Exam Updated Vital Signs BP 114/88  (BP Location: Right Arm)   Pulse (!) 109   Temp 98 F (36.7 C) (Oral)   Resp 18   Ht 6' (1.829 m)   Wt 88 kg   SpO2 96%   BMI 26.31 kg/m  Physical Exam Vitals and nursing note reviewed.  Constitutional:      General: He is not in acute distress.    Appearance: He is well-developed.     Comments: Patient is sleeping easily arousable appears to be in no acute discomfort.  Verbally aggressive as soon as I approached him.  HENT:     Head: Atraumatic.  Eyes:     Conjunctiva/sclera: Conjunctivae normal.  Musculoskeletal:     Cervical back: Neck supple.     Comments: Wearing shoes, unable to examine feet.  Skin:    Findings: No rash.  Neurological:     Mental Status: He is alert.     ED Results / Procedures / Treatments   Labs (all labs ordered are listed, but only abnormal results are displayed) Labs Reviewed - No data to display  EKG None  Radiology No results found.  Procedures Procedures    Medications Ordered in ED Medications - No data to display  ED Course/ Medical Decision Making/ A&P                                 Medical Decision Making  BP 114/88 (BP Location: Right Arm)  Pulse (!) 109   Temp 98 F (36.7 C) (Oral)   Resp 18   Ht 6' (1.829 m)   Wt 88 kg   SpO2 96%   BMI 26.31 kg/m   70:46 AM  47 year old male with significant history of homelessness, malingering, schizophrenia, polysubstance abuse diabetes, hypertension, neuropathy, complaining of leg pain.  As I approached patient to talk to him he immediately using explicit language and profanity.  History is limited due to patient being verbally aggressive.  EMR reviewed patient is well-known to the department who has been seen in the ED multiple times for different complaints usually with unremarkable workup.  Patient is verbally abusive and I am unable to obtain a full complete history however patient overall without signs of acute pathology.  No history of malingering.  At this time I have  very low suspicion for any acute emergent pathology causing his complaint.        Final Clinical Impression(s) / ED Diagnoses Final diagnoses:  Bilateral leg pain    Rx / DC Orders ED Discharge Orders     None         Fayrene Helper, PA-C 03/09/23 0423    Glynn Octave, MD 03/09/23 [phone]

## 2023-03-09 NOTE — ED Notes (Signed)
Pt called for room no answer. Pt eloped from the lobby

## 2023-03-10 ENCOUNTER — Emergency Department (HOSPITAL_COMMUNITY): Payer: PPO

## 2023-03-10 DIAGNOSIS — M7989 Other specified soft tissue disorders: Secondary | ICD-10-CM | POA: Diagnosis not present

## 2023-03-10 DIAGNOSIS — M7732 Calcaneal spur, left foot: Secondary | ICD-10-CM | POA: Diagnosis not present

## 2023-03-10 DIAGNOSIS — M7731 Calcaneal spur, right foot: Secondary | ICD-10-CM | POA: Diagnosis not present

## 2023-03-10 LAB — CBG MONITORING, ED: Glucose-Capillary: 200 mg/dL — ABNORMAL HIGH (ref 70–99)

## 2023-03-10 MED ORDER — TETANUS-DIPHTH-ACELL PERTUSSIS 5-2.5-18.5 LF-MCG/0.5 IM SUSY
0.5000 mL | PREFILLED_SYRINGE | Freq: Once | INTRAMUSCULAR | Status: AC
  Administered 2023-03-10: 0.5 mL via INTRAMUSCULAR
  Filled 2023-03-10: qty 0.5

## 2023-03-10 MED ORDER — NAPROXEN 500 MG PO TABS: 500.0000 mg | ORAL_TABLET | Freq: Two times a day (BID) | ORAL | 0 refills | Status: DC

## 2023-03-10 NOTE — ED Provider Notes (Signed)
[place] EMERGENCY DEPARTMENT AT [facility] Provider Note   CSN: [id number] Arrival date & time: 03/09/23  2138     History  Chief Complaint  Patient presents with   Foot Pain    Left foot     Alejandro Baker is a 47 y.o. male.  Patient reports pain to his bilateral soles of his feet after "stepping on some tacks and staples today".  States he was outside wearing his shoes when he believes he stepped on a bunch of tacks and staples.  He thinks it penetrated his shoes.  Complains of wounds and punctures to his feet bilaterally.  No fevers, chills, nausea or vomiting.  Unknown last tetanus shot.  Did not fall to the ground or hit his head.  The history is provided by the patient.  Foot Pain Pertinent negatives include no chest pain, no abdominal pain, no headaches and no shortness of breath.       Home Medications Prior to Admission medications   Medication Sig Start Date End Date Taking? Authorizing Provider  albuterol (VENTOLIN HFA) 108 (90 Base) MCG/ACT inhaler Inhale 1-2 puffs into the lungs every 6 (six) hours as needed for wheezing or shortness of breath. 05/19/21   Prosperi, Christian H, PA-C  benzonatate (TESSALON) 100 MG capsule Take 1 capsule (100 mg total) by mouth every 8 (eight) hours. 02/18/23   Smoot, Shawn Route, PA-C  doxycycline (VIBRAMYCIN) 100 MG capsule Take 1 capsule (100 mg total) by mouth 2 (two) times daily. 02/07/23   Tilden Fossa, MD  fluticasone [facility]) 50 MCG/ACT nasal spray Place 1 spray into both nostrils daily. 05/19/21   Prosperi, Christian H, PA-C  metFORMIN (GLUCOPHAGE) 500 MG tablet Take 1 tablet (500 mg total) by mouth 2 (two) times daily with a meal. 02/07/23   Tilden Fossa, MD  risperiDONE (RISPERDAL) 2 MG tablet Take 1 tablet (2 mg total) by mouth at bedtime. 04/10/22 10/07/22  Alicia Amel, MD      Allergies    Penicillins and Povidone-iodine    Review of Systems   Review of Systems  Constitutional:  Negative for  activity change, appetite change and fever.  HENT:  Negative for congestion.   Respiratory:  Negative for cough, chest tightness and shortness of breath.   Cardiovascular:  Negative for chest pain.  Gastrointestinal:  Negative for abdominal pain, nausea and vomiting.  Genitourinary:  Negative for dysuria.  Musculoskeletal:  Positive for arthralgias and myalgias.  Neurological:  Negative for dizziness, weakness and headaches.   all other systems are negative except as noted in the HPI and PMH.    Physical Exam Updated Vital Signs BP (!) 120/94 (BP Location: Left Arm)   Pulse 100   Temp 98.5 F (36.9 C) (Oral)   Resp 18   Ht 6' (1.829 m)   Wt 134.3 kg   SpO2 98%   BMI 40.14 kg/m  Physical Exam Vitals and nursing note reviewed.  Constitutional:      General: He is not in acute distress.    Appearance: He is well-developed.     Comments: Disheveled  HENT:     Head: Normocephalic and atraumatic.     Mouth/Throat:     Pharynx: No oropharyngeal exudate.  Eyes:     Conjunctiva/sclera: Conjunctivae normal.     Pupils: Pupils are equal, round, and reactive to light.  Neck:     Comments: No meningismus. Cardiovascular:     Rate and Rhythm: Normal rate and regular rhythm.  Heart sounds: Normal heart sounds. No murmur heard. Pulmonary:     Effort: Pulmonary effort is normal. No respiratory distress.     Breath sounds: Normal breath sounds.  Abdominal:     Palpations: Abdomen is soft.     Tenderness: There is no abdominal tenderness. There is no guarding or rebound.  Musculoskeletal:        General: No tenderness. Normal range of motion.     Cervical back: Normal range of motion and neck supple.     Comments: No obvious wounds to bottoms of feet.  Intact DP and PT pulse bilaterally.  Able to wiggle toes.  Full range of motion of bilateral hips and ankles and knees.  Skin:    General: Skin is warm.  Neurological:     Mental Status: He is alert and oriented to person, place,  and time.     Cranial Nerves: No cranial nerve deficit.     Motor: No abnormal muscle tone.     Coordination: Coordination normal.     Comments: No ataxia on finger to nose bilaterally. No pronator drift. 5/5 strength throughout. CN 2-12 intact.Equal grip strength. Sensation intact.   Psychiatric:        Behavior: Behavior normal.     ED Results / Procedures / Treatments   Labs (all labs ordered are listed, but only abnormal results are displayed) Labs Reviewed  CBG MONITORING, ED    EKG None  Radiology DG Foot 2 Views Left  Result Date: 03/10/2023 CLINICAL DATA:  Rule out foreign body EXAM: RIGHT FOOT - 2 VIEW; LEFT FOOT - 2 VIEW COMPARISON:  None Available. FINDINGS: Small osseous fragment lateral to the distal tuft of the right fourth distal phalanx suspicious for acute fracture. Soft tissue swelling about the right fourth toe. No acute fracture or dislocation on the left. Bilateral calcaneal spurs. No radiopaque foreign body. IMPRESSION: 1. Small osseous fragment lateral to the distal tuft of the right fourth distal phalanx suspicious for acute fracture. 2. No acute fracture or dislocation on the left. 3. No radiopaque foreign body. Electronically Signed   By: Minerva Fester M.D.   On: 03/10/2023 02:07   DG Foot 2 Views Right  Result Date: 03/10/2023 CLINICAL DATA:  Rule out foreign body EXAM: RIGHT FOOT - 2 VIEW; LEFT FOOT - 2 VIEW COMPARISON:  None Available. FINDINGS: Small osseous fragment lateral to the distal tuft of the right fourth distal phalanx suspicious for acute fracture. Soft tissue swelling about the right fourth toe. No acute fracture or dislocation on the left. Bilateral calcaneal spurs. No radiopaque foreign body. IMPRESSION: 1. Small osseous fragment lateral to the distal tuft of the right fourth distal phalanx suspicious for acute fracture. 2. No acute fracture or dislocation on the left. 3. No radiopaque foreign body. Electronically Signed   By: Minerva Fester  M.D.   On: 03/10/2023 02:07    Procedures Procedures    Medications Ordered in ED Medications  Tdap (BOOSTRIX) injection 0.5 mL (has no administration in time range)    ED Course/ Medical Decision Making/ A&P                                 Medical Decision Making Amount and/or Complexity of Data Reviewed Labs: ordered. Decision-making details documented in ED Course. Radiology: ordered and independent interpretation performed. Decision-making details documented in ED Course. ECG/medicine tests: ordered and independent interpretation performed. Decision-making details documented  in ED Course.  Risk Prescription drug management.   Concern for puncture wounds to the bottoms of his feet.  Neurovascularly intact.  No fever.  He is a diabetic.  No obvious wounds visualized.  X-ray obtained to rule out foreign body.  X-rays are negative for foreign body.  Does show questionable tuft fracture of right fourth toe.  Will buddy tape and placed in postop shoe. Results reviewed and interpreted by me. No obvious puncture wounds to feet.  Will defer antibiotics at this time.  Patient to follow-up with orthopedics as well as PCP.  Return precautions discussed.        Final Clinical Impression(s) / ED Diagnoses Final diagnoses:  Closed nondisplaced fracture of distal phalanx of lesser toe of right foot, initial encounter    Rx / DC Orders ED Discharge Orders     None         Arilynn Blakeney, Jeannett Senior, MD 03/10/23 [phone]

## 2023-03-10 NOTE — Discharge Instructions (Signed)
Your x-ray shows no foreign bodies but does show a questionable fracture of your right fourth toe.  Keep the tape in place as prescribed and follow-up with the orthopedic doctor for further evaluation.  Return to the ED with new or worsening symptoms.

## 2023-03-12 ENCOUNTER — Emergency Department (HOSPITAL_COMMUNITY)
Admission: EM | Admit: 2023-03-12 | Discharge: 2023-03-13 | Disposition: A | Discharge status: Home / Self Care | Payer: PPO | Attending: Emergency Medicine | Admitting: Emergency Medicine

## 2023-03-12 DIAGNOSIS — E1140 Type 2 diabetes mellitus with diabetic neuropathy, unspecified: Secondary | ICD-10-CM | POA: Insufficient documentation

## 2023-03-12 DIAGNOSIS — I10 Essential (primary) hypertension: Secondary | ICD-10-CM | POA: Diagnosis not present

## 2023-03-12 DIAGNOSIS — M79675 Pain in left toe(s): Secondary | ICD-10-CM | POA: Diagnosis not present

## 2023-03-12 DIAGNOSIS — Z7984 Long term (current) use of oral hypoglycemic drugs: Secondary | ICD-10-CM | POA: Insufficient documentation

## 2023-03-12 MED ORDER — ACETAMINOPHEN 325 MG PO TABS
650.0000 mg | ORAL_TABLET | Freq: Once | ORAL | Status: AC
  Administered 2023-03-13: 650 mg via ORAL
  Filled 2023-03-12: qty 2

## 2023-03-12 NOTE — ED Notes (Signed)
Called pt x2 for triage and pt isnt in lobby. I will attempt once again

## 2023-03-12 NOTE — Discharge Instructions (Addendum)
As discussed, x-ray imaging 2 days ago did not show fracture of the fourth toe on your right foot.  Recommend continue wearing postop shoe.  Recommend use of Tylenol/Motrin for treatment of pain.  Recommend follow-up with family doctor for reassessment of your symptoms.  Please do not hesitate to return if the worrisome signs and symptoms we discussed become apparent.

## 2023-03-12 NOTE — ED Triage Notes (Signed)
Patient has continued L foot pain. Seen for same on 11/9 and given post-op shoe, which he arrives wearing. Denies new injury.

## 2023-03-13 ENCOUNTER — Emergency Department (HOSPITAL_COMMUNITY)
Admission: EM | Admit: 2023-03-13 | Discharge: 2023-03-13 | Disposition: A | Discharge status: Home / Self Care | Payer: PPO | Attending: Emergency Medicine | Admitting: Emergency Medicine

## 2023-03-13 ENCOUNTER — Other Ambulatory Visit: Payer: Self-pay

## 2023-03-13 DIAGNOSIS — M79675 Pain in left toe(s): Secondary | ICD-10-CM | POA: Diagnosis not present

## 2023-03-13 DIAGNOSIS — E119 Type 2 diabetes mellitus without complications: Secondary | ICD-10-CM | POA: Insufficient documentation

## 2023-03-13 DIAGNOSIS — J45909 Unspecified asthma, uncomplicated: Secondary | ICD-10-CM | POA: Insufficient documentation

## 2023-03-13 DIAGNOSIS — Z5900 Homelessness unspecified: Secondary | ICD-10-CM | POA: Insufficient documentation

## 2023-03-13 MED ORDER — IBUPROFEN 400 MG PO TABS
600.0000 mg | ORAL_TABLET | Freq: Once | ORAL | Status: DC
  Filled 2023-03-13: qty 1

## 2023-03-13 NOTE — ED Provider Notes (Incomplete)
[place] EMERGENCY DEPARTMENT AT [facility] Provider Note   CSN: [id number] Arrival date & time: 03/12/23  1813     History {Add pertinent medical, surgical, social history, OB history to HPI:1} Chief Complaint  Patient presents with  . Foot Pain    Musa Sorich is a 47 y.o. male.   Foot Pain   47 year old male presents emergency department with complaints of left toe pain.  Patient was seen on 03/09/2023 with similar complaints.  Has been wearing postop shoe.  Denies any recent trauma/falls.  States that is just continued to hurt.  States that it hurts with walking and is relieved with rest.  Has taken no medication for his symptoms.  Past medical history significant for homelessness, malingering, schizoaffective disorder, diabetes mellitus, hypertension, neuropathy, polysubstance abuse  Home Medications Prior to Admission medications   Medication Sig Start Date End Date Taking? Authorizing Provider  albuterol (VENTOLIN HFA) 108 (90 Base) MCG/ACT inhaler Inhale 1-2 puffs into the lungs every 6 (six) hours as needed for wheezing or shortness of breath. 05/19/21   Prosperi, Christian H, PA-C  benzonatate (TESSALON) 100 MG capsule Take 1 capsule (100 mg total) by mouth every 8 (eight) hours. 02/18/23   Smoot, Shawn Route, PA-C  doxycycline (VIBRAMYCIN) 100 MG capsule Take 1 capsule (100 mg total) by mouth 2 (two) times daily. 02/07/23   Tilden Fossa, MD  fluticasone [facility]) 50 MCG/ACT nasal spray Place 1 spray into both nostrils daily. 05/19/21   Prosperi, Christian H, PA-C  metFORMIN (GLUCOPHAGE) 500 MG tablet Take 1 tablet (500 mg total) by mouth 2 (two) times daily with a meal. 02/07/23   Tilden Fossa, MD  naproxen (NAPROSYN) 500 MG tablet Take 1 tablet (500 mg total) by mouth 2 (two) times daily with a meal. 03/10/23   Rancour, Jeannett Senior, MD  risperiDONE (RISPERDAL) 2 MG tablet Take 1 tablet (2 mg total) by mouth at bedtime. 04/10/22 10/07/22  Alicia Amel, MD       Allergies    Penicillins and Povidone-iodine    Review of Systems   Review of Systems  All other systems reviewed and are negative.   Physical Exam Updated Vital Signs BP 127/86 (BP Location: Right Arm)   Pulse 65   Temp 97.8 F (36.6 C) (Oral)   Resp 18   SpO2 100%  Physical Exam Vitals and nursing note reviewed.  Constitutional:      General: He is not in acute distress.    Appearance: He is well-developed.  HENT:     Head: Normocephalic and atraumatic.  Eyes:     Conjunctiva/sclera: Conjunctivae normal.  Cardiovascular:     Rate and Rhythm: Normal rate and regular rhythm.     Heart sounds: No murmur heard. Pulmonary:     Effort: Pulmonary effort is normal. No respiratory distress.     Breath sounds: Normal breath sounds.  Abdominal:     Palpations: Abdomen is soft.     Tenderness: There is no abdominal tenderness.  Musculoskeletal:        General: No swelling.     Cervical back: Neck supple.     Comments: Patient with full range of motion of left ankle, digits.  Tenderness of 2nd through 4th distal phalanxes of digits on left foot.  Capillary fill less than 2 seconds.  Pedal pulses 2+ bilaterally.  No overlying erythema, palpable fluctuance/induration.  Skin:    General: Skin is warm and dry.     Capillary Refill: Capillary refill takes  less than 2 seconds.  Neurological:     Mental Status: He is alert.  Psychiatric:     Comments: Blunted mood/affect.     ED Results / Procedures / Treatments   Labs (all labs ordered are listed, but only abnormal results are displayed) Labs Reviewed - No data to display  EKG None  Radiology No results found.  Procedures Procedures  {Document cardiac monitor, telemetry assessment procedure when appropriate:1}  Medications Ordered in ED Medications  acetaminophen (TYLENOL) tablet 650 mg (has no administration in time range)    ED Course/ Medical Decision Making/ A&P   {   Click here for ABCD2, HEART and other  calculatorsREFRESH Note before signing :1}                              Medical Decision Making Risk OTC drugs.   This patient presents to the ED for concern of toe pain, this involves an extensive number of treatment options, and is a complaint that carries with it a high risk of complications and morbidity.  The differential diagnosis includes fracture, strain/pain, dislocation, ligamentous/tendinous injury, neurovascular compromise, cellulitis, erysipelas, necrotizing infection, ischemia   Co morbidities that complicate the patient evaluation  See HPI   Additional history obtained:  Additional history obtained from EMR External records from outside source obtained and reviewed including hospital records   Lab Tests:  N/a   Imaging Studies ordered:  N/a   Cardiac Monitoring: / EKG:  The patient was maintained on a cardiac monitor.  I personally viewed and interpreted the cardiac monitored which showed an underlying rhythm of: Sinus rhythm   Consultations Obtained:  N/a   Problem List / ED Course / Critical interventions / Medication management  Left toe pain I ordered medication including Tylenol  Reevaluation of the patient after these medicines showed that the patient improved I have reviewed the patients home medicines and have made adjustments as needed   Social Determinants of Health:  Homelessness.  Polysubstance use.   Test / Admission - Considered:  Left toe pain Vitals signs within normal range and stable throughout visit. 47 year old male presents emergency department with complaints of left toe pain.  Patient symptoms are going on for the past several days.  Was seen 4 days ago with negative extremity and at that time for fracture versus dislocation of left toes.  Was found with evidence of fourth distal phalanx fracture of right foot.  Reports no recent trauma/falls/injuries.  On exam, patient with capillary refill less than 2 seconds with  intact pedal pulses; low suspicion for digit/limb ischemia.  No overlying skin changes concerning for secondary infectious process.  Full range of motion of digits of left foot as well as left ankle.  Given the patient had x-ray imaging 4 days ago with no recent trauma, x-ray imaging was foregone.  Treated with Tylenol and noted improvement.  Recommend continued use of while supporting orthotic, rest, ice, elevation, Tylenol/NSAIDs and follow-up with primary care in the outpatient setting.  Treatment plan discussed at length with patient Worrisome signs and symptoms were discussed with the patient, and the patient acknowledged understanding to return to the ED if noticed. Patient was stable upon discharge.    {Document critical care time when appropriate:1} {Document review of labs and clinical decision tools ie heart score, Chads2Vasc2 etc:1}  {Document your independent review of radiology images, and any outside records:1} {Document your discussion with family members, caretakers, and  with consultants:1} {Document social determinants of health affecting pt's care:1} {Document your decision making why or why not admission, treatments were needed:1} Final Clinical Impression(s) / ED Diagnoses Final diagnoses:  Pain in left toe(s)    Rx / DC Orders ED Discharge Orders     None

## 2023-03-13 NOTE — ED Provider Notes (Signed)
[place] EMERGENCY DEPARTMENT AT [facility] Provider Note   CSN: [id number] Arrival date & time: 03/13/23  7829     History  Chief Complaint  Patient presents with   Foot Pain    Alejandro Baker is a 47 y.o. male with medical history of asthma, diabetes, homelessness, schizoaffective disorder and malingering.  The patient presents to the ED for evaluation of left foot pain.  The patient has been seen 2 times for the same complaint.  Last seen this morning discharge around midnight.  He returns complaining of left foot pain from an injury 3 days ago.  At that time, he had apparently stepped on some staples or nails that had penetrated his shoe he was wearing.  Previous provider collected imaging which showed possible tuft fracture and patient had toes buddy taped and was placed in postop shoe.  No signs of external trauma on initial provider examination so antibiotics were deferred at this time.  He returned last night and was seen and provided Tylenol for his foot pain.  At that time, again, there were no signs of infection and the patient was discharged.  Patient denies any new trauma for me.  Patient is minimally responsive on my examination, rolls over and goes back to bed when I attempt to ask him why he is here.  Just says "foot pain".  He denies any new trauma.   Foot Pain       Home Medications Prior to Admission medications   Medication Sig Start Date End Date Taking? Authorizing Provider  albuterol (VENTOLIN HFA) 108 (90 Base) MCG/ACT inhaler Inhale 1-2 puffs into the lungs every 6 (six) hours as needed for wheezing or shortness of breath. 05/19/21   Prosperi, Christian H, PA-C  benzonatate (TESSALON) 100 MG capsule Take 1 capsule (100 mg total) by mouth every 8 (eight) hours. 02/18/23   Smoot, Shawn Route, PA-C  doxycycline (VIBRAMYCIN) 100 MG capsule Take 1 capsule (100 mg total) by mouth 2 (two) times daily. 02/07/23   Tilden Fossa, MD  fluticasone [facility]) 50  MCG/ACT nasal spray Place 1 spray into both nostrils daily. 05/19/21   Prosperi, Christian H, PA-C  metFORMIN (GLUCOPHAGE) 500 MG tablet Take 1 tablet (500 mg total) by mouth 2 (two) times daily with a meal. 02/07/23   Tilden Fossa, MD  naproxen (NAPROSYN) 500 MG tablet Take 1 tablet (500 mg total) by mouth 2 (two) times daily with a meal. 03/10/23   Rancour, Jeannett Senior, MD  risperiDONE (RISPERDAL) 2 MG tablet Take 1 tablet (2 mg total) by mouth at bedtime. 04/10/22 10/07/22  Alicia Amel, MD      Allergies    Penicillins and Povidone-iodine    Review of Systems   Review of Systems  Unable to perform ROS: Other (Level 5 caveat)  All other systems reviewed and are negative.   Physical Exam Updated Vital Signs BP (!) 140/95 (BP Location: Right Arm)   Pulse 69   Temp 98.1 F (36.7 C) (Oral)   Resp 18   SpO2 100%  Physical Exam Vitals and nursing note reviewed.  Constitutional:      General: He is not in acute distress.    Appearance: He is well-developed.  HENT:     Head: Normocephalic and atraumatic.  Eyes:     Conjunctiva/sclera: Conjunctivae normal.  Cardiovascular:     Rate and Rhythm: Normal rate and regular rhythm.     Heart sounds: No murmur heard. Pulmonary:     Effort:  Pulmonary effort is normal. No respiratory distress.     Breath sounds: Normal breath sounds.  Abdominal:     Palpations: Abdomen is soft.     Tenderness: There is no abdominal tenderness.  Musculoskeletal:        General: No swelling.     Cervical back: Neck supple.     Comments: Patient with full range of motion of left ankle, digits.  Tenderness of 2nd through 4th distal phalanxes of digits on left foot.  Capillary fill less than 2 seconds. No signs of infection or deformity.   Skin:    General: Skin is warm and dry.     Capillary Refill: Capillary refill takes less than 2 seconds.  Neurological:     Mental Status: He is alert.  Psychiatric:        Mood and Affect: Mood normal.     ED  Results / Procedures / Treatments   Labs (all labs ordered are listed, but only abnormal results are displayed) Labs Reviewed - No data to display  EKG None  Radiology No results found.  Procedures Procedures   Medications Ordered in ED Medications  ibuprofen (ADVIL) tablet 600 mg (has no administration in time range)    ED Course/ Medical Decision Making/ A&P  Medical Decision Making  47-year male presents for the third time in 4 days for evaluation of pain in left foot.  Please see HPI for further details.  On examination patient has no signs of infection or deformity to left foot or phalanxes.  He has full range of motion of his left ankle, digits.  There is tenderness at the second to the fourth distal phalanxes of the digits of the left foot without signs of infection.  Patient was seen and discharged 8 hours ago from this ED with an unremarkable workup.  I reviewed patient's chart, patient imaging which did show a possible tuft fracture and the patient was referred to orthopedics and provided with a hard soled shoe.  He denies any new trauma.  Patient was provided ibuprofen, bag lunch and allowed to rest for 2 hours in ED today.  Patient was encouraged to follow-up with orthopedics.  Stable to discharge.   Final Clinical Impression(s) / ED Diagnoses Final diagnoses:  Pain in left toe(s)    Rx / DC Orders ED Discharge Orders     None         Al Decant, PA-C 03/13/23 0856    Gloris Manchester, MD 03/18/23 [phone]

## 2023-03-13 NOTE — Discharge Instructions (Addendum)
Please continue wearing hard soled shoe.  Please take ibuprofen every 6 hours as needed.  Follow-up with orthopedics.

## 2023-03-13 NOTE — ED Provider Notes (Signed)
[place] EMERGENCY DEPARTMENT AT [facility] Provider Note   CSN: [id number] Arrival date & time: 03/12/23  1813     History  Chief Complaint  Patient presents with   Foot Pain    Alejandro Baker is a 47 y.o. male.   Foot Pain   47 year old male presents emergency department with complaints of left toe pain.  Patient was seen on 03/09/2023 with similar complaints.  Has been wearing postop shoe.  Denies any recent trauma/falls.  States that is just continued to hurt.  States that it hurts with walking and is relieved with rest.  Has taken no medication for his symptoms.  Past medical history significant for homelessness, malingering, schizoaffective disorder, diabetes mellitus, hypertension, neuropathy, polysubstance abuse  Home Medications Prior to Admission medications   Medication Sig Start Date End Date Taking? Authorizing Provider  albuterol (VENTOLIN HFA) 108 (90 Base) MCG/ACT inhaler Inhale 1-2 puffs into the lungs every 6 (six) hours as needed for wheezing or shortness of breath. 05/19/21   Prosperi, Christian H, PA-C  benzonatate (TESSALON) 100 MG capsule Take 1 capsule (100 mg total) by mouth every 8 (eight) hours. 02/18/23   Smoot, Shawn Route, PA-C  doxycycline (VIBRAMYCIN) 100 MG capsule Take 1 capsule (100 mg total) by mouth 2 (two) times daily. 02/07/23   Tilden Fossa, MD  fluticasone [facility]) 50 MCG/ACT nasal spray Place 1 spray into both nostrils daily. 05/19/21   Prosperi, Christian H, PA-C  metFORMIN (GLUCOPHAGE) 500 MG tablet Take 1 tablet (500 mg total) by mouth 2 (two) times daily with a meal. 02/07/23   Tilden Fossa, MD  naproxen (NAPROSYN) 500 MG tablet Take 1 tablet (500 mg total) by mouth 2 (two) times daily with a meal. 03/10/23   Rancour, Jeannett Senior, MD  risperiDONE (RISPERDAL) 2 MG tablet Take 1 tablet (2 mg total) by mouth at bedtime. 04/10/22 10/07/22  Alicia Amel, MD      Allergies    Penicillins and Povidone-iodine    Review of Systems    Review of Systems  All other systems reviewed and are negative.   Physical Exam Updated Vital Signs BP 127/86 (BP Location: Right Arm)   Pulse 65   Temp 97.8 F (36.6 C) (Oral)   Resp 18   SpO2 100%  Physical Exam Vitals and nursing note reviewed.  Constitutional:      General: He is not in acute distress.    Appearance: He is well-developed.  HENT:     Head: Normocephalic and atraumatic.  Eyes:     Conjunctiva/sclera: Conjunctivae normal.  Cardiovascular:     Rate and Rhythm: Normal rate and regular rhythm.     Heart sounds: No murmur heard. Pulmonary:     Effort: Pulmonary effort is normal. No respiratory distress.     Breath sounds: Normal breath sounds.  Abdominal:     Palpations: Abdomen is soft.     Tenderness: There is no abdominal tenderness.  Musculoskeletal:        General: No swelling.     Cervical back: Neck supple.     Comments: Patient with full range of motion of left ankle, digits.  Tenderness of 2nd through 4th distal phalanxes of digits on left foot.  Capillary fill less than 2 seconds.  Pedal pulses 2+ bilaterally.  No overlying erythema, palpable fluctuance/induration.  Skin:    General: Skin is warm and dry.     Capillary Refill: Capillary refill takes less than 2 seconds.  Neurological:  Mental Status: He is alert.  Psychiatric:     Comments: Blunted mood/affect.     ED Results / Procedures / Treatments   Labs (all labs ordered are listed, but only abnormal results are displayed) Labs Reviewed - No data to display  EKG None  Radiology No results found.  Procedures Procedures    Medications Ordered in ED Medications  acetaminophen (TYLENOL) tablet 650 mg (has no administration in time range)    ED Course/ Medical Decision Making/ A&P                                 Medical Decision Making Risk OTC drugs.   This patient presents to the ED for concern of toe pain, this involves an extensive number of treatment options,  and is a complaint that carries with it a high risk of complications and morbidity.  The differential diagnosis includes fracture, strain/pain, dislocation, ligamentous/tendinous injury, neurovascular compromise, cellulitis, erysipelas, necrotizing infection, ischemia   Co morbidities that complicate the patient evaluation  See HPI   Additional history obtained:  Additional history obtained from EMR External records from outside source obtained and reviewed including hospital records   Lab Tests:  N/a   Imaging Studies ordered:  N/a   Cardiac Monitoring: / EKG:  The patient was maintained on a cardiac monitor.  I personally viewed and interpreted the cardiac monitored which showed an underlying rhythm of: Sinus rhythm   Consultations Obtained:  N/a   Problem List / ED Course / Critical interventions / Medication management  Left toe pain I ordered medication including Tylenol  Reevaluation of the patient after these medicines showed that the patient improved I have reviewed the patients home medicines and have made adjustments as needed   Social Determinants of Health:  Homelessness.  Polysubstance use.   Test / Admission - Considered:  Left toe pain Vitals signs within normal range and stable throughout visit. 47 year old male presents emergency department with complaints of left toe pain.  Patient symptoms are going on for the past several days.  Was seen 4 days ago with negative extremity and at that time for fracture versus dislocation of left toes.  Was found with evidence of fourth distal phalanx fracture of right foot.  Reports no recent trauma/falls/injuries.  On exam, patient with capillary refill less than 2 seconds with intact pedal pulses; low suspicion for digit/limb ischemia.  No overlying skin changes concerning for secondary infectious process.  No swelling/edema concerning for DVT.  Full range of motion of digits of left foot as well as left ankle.   Given the patient had x-ray imaging 4 days ago with no recent trauma, x-ray imaging was foregone.  Treated with Tylenol and noted improvement.  Recommend continued use of while supporting orthotic, rest, ice, elevation, Tylenol/NSAIDs and follow-up with primary care in the outpatient setting.  Treatment plan discussed at length with patient and he acknowledged understanding was agreeable to said plan.  Patient overall well-appearing, afebrile in no acute distress. Worrisome signs and symptoms were discussed with the patient, and the patient acknowledged understanding to return to the ED if noticed. Patient was stable upon discharge.          Final Clinical Impression(s) / ED Diagnoses Final diagnoses:  Pain in left toe(s)    Rx / DC Orders ED Discharge Orders     None         Peter Garter, [place]  03/13/23 0005    Gloris Manchester, MD 03/13/23 [phone]

## 2023-03-13 NOTE — ED Triage Notes (Addendum)
Pt arrives to ED c/o left foot pain from previous injury 3 days ago. Pt reports he stepped on a nail and was seen here for same issue. No bleeding or deformities noted. Pt ambulatory into triage

## 2023-03-13 NOTE — ED Notes (Signed)
Pt given sandwich and drink. Tolerated well

## 2023-03-17 ENCOUNTER — Other Ambulatory Visit: Payer: Self-pay

## 2023-03-17 ENCOUNTER — Emergency Department (HOSPITAL_COMMUNITY)
Admission: EM | Admit: 2023-03-17 | Discharge: 2023-03-17 | Discharge status: Against Medical Advice | Payer: PPO | Attending: Emergency Medicine | Admitting: Emergency Medicine

## 2023-03-17 ENCOUNTER — Encounter (HOSPITAL_COMMUNITY): Payer: Self-pay

## 2023-03-17 ENCOUNTER — Emergency Department (HOSPITAL_COMMUNITY)
Admission: EM | Admit: 2023-03-17 | Discharge: 2023-03-18 | Disposition: A | Discharge status: Home / Self Care | Payer: PPO | Source: Home / Self Care | Attending: Emergency Medicine | Admitting: Emergency Medicine

## 2023-03-17 DIAGNOSIS — W228XXA Striking against or struck by other objects, initial encounter: Secondary | ICD-10-CM | POA: Insufficient documentation

## 2023-03-17 DIAGNOSIS — M79672 Pain in left foot: Secondary | ICD-10-CM | POA: Insufficient documentation

## 2023-03-17 DIAGNOSIS — R739 Hyperglycemia, unspecified: Secondary | ICD-10-CM | POA: Diagnosis not present

## 2023-03-17 DIAGNOSIS — M79673 Pain in unspecified foot: Secondary | ICD-10-CM | POA: Diagnosis not present

## 2023-03-17 DIAGNOSIS — Z5321 Procedure and treatment not carried out due to patient leaving prior to being seen by health care provider: Secondary | ICD-10-CM | POA: Diagnosis not present

## 2023-03-17 NOTE — ED Triage Notes (Addendum)
Pt states that he kicked a box 1 month ago and has ben experiencing L foot pain since then. Was seen 11/17 for same. Arrives in post-op shoe.   Pt's speech is tangential. Difficult to understand why he is returning to ER, states " I guess I want you to look at it and see if it might fall off." In triage, moves toes, good color and sensation to L foot.

## 2023-03-17 NOTE — ED Notes (Signed)
Pt reported he no longer wants to be seen.  Pt encouraged to stay.  NAD noted.  Pt walked out of department w/ no difficulty.

## 2023-03-17 NOTE — ED Triage Notes (Signed)
Per EMS, Pt, from the street, c/o L foot pain x a while.  Pain score 7/10.  Pt has  been seen several times for same.  Pt noted to be wearing his post-op shoe on the L foot.    Pt reports he has not taken anything for pain because he cannot afford medication.    Pt noted to walk around triage area w/o difficulty.

## 2023-03-18 NOTE — Discharge Instructions (Signed)
Follow-up with your primary care doctor and/or orthopedics.

## 2023-03-18 NOTE — ED Provider Notes (Signed)
[place] EMERGENCY DEPARTMENT AT [facility] Provider Note   CSN: [id number] Arrival date & time: 03/17/23  2258     History  Chief Complaint  Patient presents with   Foot Injury    Alejandro Baker is a 47 y.o. male.  The history is provided by the patient and medical records.  Foot Injury  47 y.o. M here with left foot pain.  States he was told it was broken a few days ago, still feeling sore.  He is also asking for socks and some shampoo.  Denies any new injury/trauma/falls.  No numbness/weakness.  Remains ambulatory.  Home Medications Prior to Admission medications   Medication Sig Start Date End Date Taking? Authorizing Provider  albuterol (VENTOLIN HFA) 108 (90 Base) MCG/ACT inhaler Inhale 1-2 puffs into the lungs every 6 (six) hours as needed for wheezing or shortness of breath. 05/19/21   Prosperi, Christian H, PA-C  benzonatate (TESSALON) 100 MG capsule Take 1 capsule (100 mg total) by mouth every 8 (eight) hours. 02/18/23   Smoot, Shawn Route, PA-C  doxycycline (VIBRAMYCIN) 100 MG capsule Take 1 capsule (100 mg total) by mouth 2 (two) times daily. 02/07/23   Tilden Fossa, MD  fluticasone [facility]) 50 MCG/ACT nasal spray Place 1 spray into both nostrils daily. 05/19/21   Prosperi, Christian H, PA-C  metFORMIN (GLUCOPHAGE) 500 MG tablet Take 1 tablet (500 mg total) by mouth 2 (two) times daily with a meal. 02/07/23   Tilden Fossa, MD  naproxen (NAPROSYN) 500 MG tablet Take 1 tablet (500 mg total) by mouth 2 (two) times daily with a meal. 03/10/23   Rancour, Jeannett Senior, MD  risperiDONE (RISPERDAL) 2 MG tablet Take 1 tablet (2 mg total) by mouth at bedtime. 04/10/22 10/07/22  Alicia Amel, MD      Allergies    Penicillins and Povidone-iodine    Review of Systems   Review of Systems  Musculoskeletal:  Positive for arthralgias.  All other systems reviewed and are negative.   Physical Exam Updated Vital Signs BP 115/81   Pulse (!) 107   Temp 98.2 F (36.8  C) (Oral)   Resp 17   Wt 89.1 kg   SpO2 98%   BMI 26.64 kg/m   Physical Exam Vitals and nursing note reviewed.  Constitutional:      Appearance: He is well-developed.  HENT:     Head: Normocephalic and atraumatic.  Eyes:     Conjunctiva/sclera: Conjunctivae normal.     Pupils: Pupils are equal, round, and reactive to light.  Cardiovascular:     Rate and Rhythm: Normal rate and regular rhythm.     Heart sounds: Normal heart sounds.  Pulmonary:     Effort: Pulmonary effort is normal.     Breath sounds: Normal breath sounds.  Abdominal:     General: Bowel sounds are normal.     Palpations: Abdomen is soft.  Musculoskeletal:        General: Normal range of motion.     Cervical back: Normal range of motion.     Comments: Wearing post-op shoe, some mild tenderness along proximal 4th metatarsal, no significant swelling/bruising present, small abrasion to dorsal foot without bleeding, no induration or findings of cellulitis, DP pulse intact, remains ambulatory  Skin:    General: Skin is warm and dry.  Neurological:     Mental Status: He is alert and oriented to person, place, and time.     ED Results / Procedures / Treatments   Labs (  all labs ordered are listed, but only abnormal results are displayed) Labs Reviewed - No data to display  EKG None  Radiology No results found.  Procedures Procedures    Medications Ordered in ED Medications - No data to display  ED Course/ Medical Decision Making/ A&P                                 Medical Decision Making  47 year old male presenting to the ED with left foot pain.  Diagnosed with possible fracture a few days ago, reports foot is still "sore".  He is wearing a postop shoe.  Does have some mild tenderness on exam but no bruising, swelling, or gross deformity.  His foot is neurovascularly intact.  There is no open wounds or sores.  No acute signs of infection.  Do not feel he needs repeat imaging at this time.  He was  given socks and shampoo as requested.  Can follow-up with PCP.  Return here for new concerns.  Final Clinical Impression(s) / ED Diagnoses Final diagnoses:  Left foot pain    Rx / DC Orders ED Discharge Orders     None         Garlon Hatchet, PA-C 03/18/23 0047    Palumbo, April, MD 03/18/23 [phone]

## 2023-03-20 ENCOUNTER — Emergency Department (HOSPITAL_COMMUNITY)
Admission: EM | Admit: 2023-03-20 | Discharge: 2023-03-20 | Disposition: A | Discharge status: Home / Self Care | Payer: PPO | Attending: Emergency Medicine | Admitting: Emergency Medicine

## 2023-03-20 ENCOUNTER — Emergency Department (HOSPITAL_COMMUNITY)
Admission: EM | Admit: 2023-03-20 | Discharge: 2023-03-21 | Discharge status: Against Medical Advice | Payer: PPO | Source: Home / Self Care | Attending: Emergency Medicine | Admitting: Emergency Medicine

## 2023-03-20 ENCOUNTER — Other Ambulatory Visit: Payer: Self-pay

## 2023-03-20 ENCOUNTER — Encounter (HOSPITAL_COMMUNITY): Payer: Self-pay

## 2023-03-20 DIAGNOSIS — Z5321 Procedure and treatment not carried out due to patient leaving prior to being seen by health care provider: Secondary | ICD-10-CM | POA: Insufficient documentation

## 2023-03-20 DIAGNOSIS — Z7984 Long term (current) use of oral hypoglycemic drugs: Secondary | ICD-10-CM | POA: Insufficient documentation

## 2023-03-20 DIAGNOSIS — F1011 Alcohol abuse, in remission: Secondary | ICD-10-CM | POA: Diagnosis not present

## 2023-03-20 DIAGNOSIS — R0981 Nasal congestion: Secondary | ICD-10-CM | POA: Insufficient documentation

## 2023-03-20 DIAGNOSIS — F10120 Alcohol abuse with intoxication, uncomplicated: Secondary | ICD-10-CM | POA: Insufficient documentation

## 2023-03-20 DIAGNOSIS — R109 Unspecified abdominal pain: Secondary | ICD-10-CM | POA: Insufficient documentation

## 2023-03-20 DIAGNOSIS — Z20822 Contact with and (suspected) exposure to covid-19: Secondary | ICD-10-CM | POA: Insufficient documentation

## 2023-03-20 DIAGNOSIS — R070 Pain in throat: Secondary | ICD-10-CM | POA: Diagnosis not present

## 2023-03-20 DIAGNOSIS — Y900 Blood alcohol level of less than 20 mg/100 ml: Secondary | ICD-10-CM | POA: Insufficient documentation

## 2023-03-20 DIAGNOSIS — R739 Hyperglycemia, unspecified: Secondary | ICD-10-CM | POA: Diagnosis not present

## 2023-03-20 DIAGNOSIS — R1084 Generalized abdominal pain: Secondary | ICD-10-CM | POA: Diagnosis not present

## 2023-03-20 DIAGNOSIS — R110 Nausea: Secondary | ICD-10-CM | POA: Diagnosis not present

## 2023-03-20 DIAGNOSIS — R059 Cough, unspecified: Secondary | ICD-10-CM | POA: Diagnosis not present

## 2023-03-20 LAB — COMPREHENSIVE METABOLIC PANEL
ALT: 20 U/L (ref 0–44)
AST: 18 U/L (ref 15–41)
Albumin: 3.7 g/dL (ref 3.5–5.0)
Alkaline Phosphatase: 69 U/L (ref 38–126)
Anion gap: 9 (ref 5–15)
BUN: 15 mg/dL (ref 6–20)
CO2: 23 mmol/L (ref 22–32)
Calcium: 9 mg/dL (ref 8.9–10.3)
Chloride: 101 mmol/L (ref 98–111)
Creatinine, Ser: 0.92 mg/dL (ref 0.61–1.24)
GFR, Estimated: >60 mL/min (ref >60)
Glucose, Bld: 323 mg/dL — ABNORMAL HIGH (ref 70–99)
Potassium: 4.4 mmol/L (ref 3.5–5.1)
Sodium: 133 mmol/L — ABNORMAL LOW (ref 135–145)
Total Bilirubin: 0.6 mg/dL (ref <1.2)
Total Protein: 6.7 g/dL (ref 6.5–8.1)

## 2023-03-20 LAB — LIPASE, BLOOD: Lipase: 35 U/L (ref 11–51)

## 2023-03-20 LAB — CBC WITH DIFFERENTIAL/PLATELET
Abs Immature Granulocytes: 0.07 10*3/uL (ref 0.00–0.07)
Basophils Absolute: 0.1 10*3/uL (ref 0.0–0.1)
Basophils Relative: 1 %
Eosinophils Absolute: 0.2 10*3/uL (ref 0.0–0.5)
Eosinophils Relative: 2 %
HCT: 44.3 % (ref 39.0–52.0)
Hemoglobin: 14.5 g/dL (ref 13.0–17.0)
Immature Granulocytes: 1 %
Lymphocytes Relative: 33 %
Lymphs Abs: 3.2 10*3/uL (ref 0.7–4.0)
MCH: 29.4 pg (ref 26.0–34.0)
MCHC: 32.7 g/dL (ref 30.0–36.0)
MCV: 89.7 fL (ref 80.0–100.0)
Monocytes Absolute: 0.7 10*3/uL (ref 0.1–1.0)
Monocytes Relative: 8 %
Neutro Abs: 5.6 10*3/uL (ref 1.7–7.7)
Neutrophils Relative %: 55 %
Platelets: 332 10*3/uL (ref 150–400)
RBC: 4.94 MIL/uL (ref 4.22–5.81)
RDW: 13.2 % (ref 11.5–15.5)
WBC: 9.9 10*3/uL (ref 4.0–10.5)
nRBC: 0 % (ref 0.0–0.2)

## 2023-03-20 LAB — RESP PANEL BY RT-PCR (RSV, FLU A&B, COVID)  RVPGX2
Influenza A by PCR: NEGATIVE
Influenza B by PCR: NEGATIVE
Resp Syncytial Virus by PCR: NEGATIVE
SARS Coronavirus 2 by RT PCR: NEGATIVE

## 2023-03-20 LAB — ETHANOL: Alcohol, Ethyl (B): <10 mg/dL (ref <10)

## 2023-03-20 NOTE — ED Triage Notes (Signed)
Pt is here today for abdominal pain and URI. Reports sinus congestion and cold symptoms. Hx of ETOH abuse. Denies using alcohol today but appears to be intoxicated. Picked up by EMS from [place].   EMS VS: 142/90 HR 100 97% on RA CBG 375

## 2023-03-20 NOTE — ED Notes (Signed)
Pt walked out of EMS bay at this time. RN attempted to stop pt. Pt refused and states, "I'm leaving." Pt has not been seen by provider. Alert and oriented x 4. Ambulatory on a steady gait.

## 2023-03-20 NOTE — ED Triage Notes (Signed)
Pt arrives to ED c/o nasal congestion x several days. Endorses sputum and productive cough. Denies CP and SOB

## 2023-03-21 NOTE — ED Provider Notes (Signed)
[place] EMERGENCY DEPARTMENT AT [facility] Provider Note   CSN: [id number] Arrival date & time: 03/20/23  2103     History  Chief Complaint  Patient presents with   Nasal Congestion    Alejandro Baker is a 47 y.o. male.  The history is provided by the patient and medical records.   47 year old male presenting to the ED for nasal congestion.  He denies any chest pain or shortness of breath.  Denies any sick contacts.  No medication tried prior to arrival.  Home Medications Prior to Admission medications   Medication Sig Start Date End Date Taking? Authorizing Provider  albuterol (VENTOLIN HFA) 108 (90 Base) MCG/ACT inhaler Inhale 1-2 puffs into the lungs every 6 (six) hours as needed for wheezing or shortness of breath. 05/19/21   Prosperi, Christian H, PA-C  benzonatate (TESSALON) 100 MG capsule Take 1 capsule (100 mg total) by mouth every 8 (eight) hours. 02/18/23   Smoot, Shawn Route, PA-C  doxycycline (VIBRAMYCIN) 100 MG capsule Take 1 capsule (100 mg total) by mouth 2 (two) times daily. 02/07/23   Tilden Fossa, MD  fluticasone [facility]) 50 MCG/ACT nasal spray Place 1 spray into both nostrils daily. 05/19/21   Prosperi, Christian H, PA-C  metFORMIN (GLUCOPHAGE) 500 MG tablet Take 1 tablet (500 mg total) by mouth 2 (two) times daily with a meal. 02/07/23   Tilden Fossa, MD  naproxen (NAPROSYN) 500 MG tablet Take 1 tablet (500 mg total) by mouth 2 (two) times daily with a meal. 03/10/23   Rancour, Jeannett Senior, MD  risperiDONE (RISPERDAL) 2 MG tablet Take 1 tablet (2 mg total) by mouth at bedtime. 04/10/22 10/07/22  Alicia Amel, MD      Allergies    Penicillins and Povidone-iodine    Review of Systems   Review of Systems  HENT:  Positive for congestion.   All other systems reviewed and are negative.   Physical Exam Updated Vital Signs BP (!) 128/99 (BP Location: Left Arm)   Pulse 100   Temp 98.4 F (36.9 C) (Oral)   Resp 17   Ht 5\' 11"  (1.803 m)   Wt 88.9  kg   SpO2 97%   BMI 27.34 kg/m   Physical Exam Vitals and nursing note reviewed.  Constitutional:      Appearance: He is well-developed.     Comments: Disheveled appearing, baseline  HENT:     Head: Normocephalic and atraumatic.  Eyes:     Conjunctiva/sclera: Conjunctivae normal.     Pupils: Pupils are equal, round, and reactive to light.  Cardiovascular:     Rate and Rhythm: Normal rate and regular rhythm.     Heart sounds: Normal heart sounds.  Pulmonary:     Effort: Pulmonary effort is normal.     Breath sounds: Normal breath sounds.  Abdominal:     General: Bowel sounds are normal.     Palpations: Abdomen is soft.  Musculoskeletal:        General: Normal range of motion.     Cervical back: Normal range of motion.  Skin:    General: Skin is warm and dry.  Neurological:     Mental Status: He is alert and oriented to person, place, and time.     ED Results / Procedures / Treatments   Labs (all labs ordered are listed, but only abnormal results are displayed) Labs Reviewed  RESP PANEL BY RT-PCR (RSV, FLU A&B, COVID)  RVPGX2    EKG None  Radiology No  results found.  Procedures Procedures    Medications Ordered in ED Medications - No data to display  ED Course/ Medical Decision Making/ A&P                                 Medical Decision Making  47 year old male presenting to the ED with nasal congestion.  He is well-known to this department.  He appears at his baseline.  He does not have any respiratory distress on exam and vitals are stable.  RVP is negative.  Plan to discharge home with symptomatic care, can follow-up with PCP.  Return here for new concerns.  Final Clinical Impression(s) / ED Diagnoses Final diagnoses:  Nasal congestion    Rx / DC Orders ED Discharge Orders     None         Garlon Hatchet, PA-C 03/21/23 0200    Shon Baton, MD 03/22/23 [phone]

## 2023-03-21 NOTE — Discharge Instructions (Addendum)
Follow-up with your primary care doctor. Return here for new concerns.

## 2023-03-22 ENCOUNTER — Emergency Department (HOSPITAL_COMMUNITY): Payer: PPO

## 2023-03-22 ENCOUNTER — Encounter (HOSPITAL_COMMUNITY): Payer: Self-pay

## 2023-03-22 ENCOUNTER — Emergency Department (HOSPITAL_COMMUNITY)
Admission: EM | Admit: 2023-03-22 | Discharge: 2023-03-22 | Disposition: A | Discharge status: Home / Self Care | Payer: PPO | Attending: Emergency Medicine | Admitting: Emergency Medicine

## 2023-03-22 ENCOUNTER — Emergency Department (HOSPITAL_COMMUNITY): Admission: EM | Admit: 2023-03-22 | Discharge: 2023-03-22 | Discharge status: Against Medical Advice | Payer: PPO

## 2023-03-22 ENCOUNTER — Other Ambulatory Visit: Payer: Self-pay

## 2023-03-22 DIAGNOSIS — T699XXA Effect of reduced temperature, unspecified, initial encounter: Secondary | ICD-10-CM | POA: Diagnosis not present

## 2023-03-22 DIAGNOSIS — Z7984 Long term (current) use of oral hypoglycemic drugs: Secondary | ICD-10-CM | POA: Insufficient documentation

## 2023-03-22 DIAGNOSIS — F259 Schizoaffective disorder, unspecified: Secondary | ICD-10-CM | POA: Insufficient documentation

## 2023-03-22 DIAGNOSIS — R0789 Other chest pain: Secondary | ICD-10-CM | POA: Diagnosis not present

## 2023-03-22 DIAGNOSIS — R69 Illness, unspecified: Secondary | ICD-10-CM | POA: Diagnosis not present

## 2023-03-22 DIAGNOSIS — R0602 Shortness of breath: Secondary | ICD-10-CM | POA: Diagnosis not present

## 2023-03-22 DIAGNOSIS — T698XXA Other specified effects of reduced temperature, initial encounter: Secondary | ICD-10-CM | POA: Insufficient documentation

## 2023-03-22 DIAGNOSIS — R079 Chest pain, unspecified: Secondary | ICD-10-CM | POA: Insufficient documentation

## 2023-03-22 DIAGNOSIS — E1140 Type 2 diabetes mellitus with diabetic neuropathy, unspecified: Secondary | ICD-10-CM | POA: Diagnosis not present

## 2023-03-22 DIAGNOSIS — Z7951 Long term (current) use of inhaled steroids: Secondary | ICD-10-CM | POA: Diagnosis not present

## 2023-03-22 DIAGNOSIS — Z5900 Homelessness unspecified: Secondary | ICD-10-CM | POA: Diagnosis not present

## 2023-03-22 DIAGNOSIS — J45909 Unspecified asthma, uncomplicated: Secondary | ICD-10-CM | POA: Diagnosis not present

## 2023-03-22 DIAGNOSIS — X31XXXA Exposure to excessive natural cold, initial encounter: Secondary | ICD-10-CM | POA: Diagnosis not present

## 2023-03-22 DIAGNOSIS — E119 Type 2 diabetes mellitus without complications: Secondary | ICD-10-CM | POA: Insufficient documentation

## 2023-03-22 LAB — CBC
HCT: 50.6 % (ref 39.0–52.0)
Hemoglobin: 16.6 g/dL (ref 13.0–17.0)
MCH: 30 pg (ref 26.0–34.0)
MCHC: 32.8 g/dL (ref 30.0–36.0)
MCV: 91.3 fL (ref 80.0–100.0)
Platelets: 313 10*3/uL (ref 150–400)
RBC: 5.54 MIL/uL (ref 4.22–5.81)
RDW: 13.3 % (ref 11.5–15.5)
WBC: 10.4 10*3/uL (ref 4.0–10.5)
nRBC: 0 % (ref 0.0–0.2)

## 2023-03-22 LAB — BASIC METABOLIC PANEL
Anion gap: 10 (ref 5–15)
BUN: 13 mg/dL (ref 6–20)
CO2: 25 mmol/L (ref 22–32)
Calcium: 9.1 mg/dL (ref 8.9–10.3)
Chloride: 99 mmol/L (ref 98–111)
Creatinine, Ser: 0.74 mg/dL (ref 0.61–1.24)
GFR, Estimated: >60 mL/min (ref >60)
Glucose, Bld: 256 mg/dL — ABNORMAL HIGH (ref 70–99)
Potassium: 4.8 mmol/L (ref 3.5–5.1)
Sodium: 134 mmol/L — ABNORMAL LOW (ref 135–145)

## 2023-03-22 LAB — CBG MONITORING, ED: Glucose-Capillary: 251 mg/dL — ABNORMAL HIGH (ref 70–99)

## 2023-03-22 LAB — TROPONIN I (HIGH SENSITIVITY): Troponin I (High Sensitivity): 6 ng/L (ref <18)

## 2023-03-22 LAB — ETHANOL: Alcohol, Ethyl (B): <10 mg/dL (ref <10)

## 2023-03-22 MED ORDER — LORAZEPAM 1 MG PO TABS
1.0000 mg | ORAL_TABLET | Freq: Once | ORAL | Status: AC
  Administered 2023-03-22: 1 mg via ORAL
  Filled 2023-03-22: qty 1

## 2023-03-22 NOTE — ED Notes (Signed)
Patient verbalizes understanding of discharge instructions. Opportunity for questioning and answers were provided. Pt discharged from ED.

## 2023-03-22 NOTE — ED Triage Notes (Signed)
Pt bib ems from outside [place]; outside all night; c/o being cold; VSS; 136/90, P 86, RR 18, 98% RA, T 97.33F, cbg 284; no meds today, hx DM, ETOH abuse, htn, schizophrenia

## 2023-03-22 NOTE — ED Notes (Signed)
Pt resting with eyes closed; respirations spontaneous, even, unlabored

## 2023-03-22 NOTE — ED Notes (Signed)
EDP notified of tachycardia

## 2023-03-22 NOTE — ED Provider Notes (Signed)
[place] EMERGENCY DEPARTMENT AT [facility] Provider Note   CSN: [id number] Arrival date & time: 03/22/23  8469     History  Chief Complaint  Patient presents with   Cold Exposure    Alejandro Baker is a 47 y.o. male with history of DM, asthma, homelessness, neuropathy, schizoaffective disorder.  Patient presents to the ED for evaluation of cold exposure.  Per triage note, the patient was brought in from outside of the [place] downtown.  Apparently he was outside all night long, complained of being cold.  Vital signs stable with EMS.  On my examination, the patient is minimally responsive.  Will open his eyes, grunts to questions and then roll back over and go to sleep.  When asked if he has chest pain or shortness of breath he states "a little".  Rectal temperature collected which shows temperature of 96.7 Fahrenheit.  HPI     Home Medications Prior to Admission medications   Medication Sig Start Date End Date Taking? Authorizing Provider  albuterol (VENTOLIN HFA) 108 (90 Base) MCG/ACT inhaler Inhale 1-2 puffs into the lungs every 6 (six) hours as needed for wheezing or shortness of breath. 05/19/21   Prosperi, Christian H, PA-C  benzonatate (TESSALON) 100 MG capsule Take 1 capsule (100 mg total) by mouth every 8 (eight) hours. 02/18/23   Smoot, Shawn Route, PA-C  doxycycline (VIBRAMYCIN) 100 MG capsule Take 1 capsule (100 mg total) by mouth 2 (two) times daily. 02/07/23   Tilden Fossa, MD  fluticasone [facility]) 50 MCG/ACT nasal spray Place 1 spray into both nostrils daily. 05/19/21   Prosperi, Christian H, PA-C  metFORMIN (GLUCOPHAGE) 500 MG tablet Take 1 tablet (500 mg total) by mouth 2 (two) times daily with a meal. 02/07/23   Tilden Fossa, MD  naproxen (NAPROSYN) 500 MG tablet Take 1 tablet (500 mg total) by mouth 2 (two) times daily with a meal. 03/10/23   Rancour, Jeannett Senior, MD  risperiDONE (RISPERDAL) 2 MG tablet Take 1 tablet (2 mg total) by mouth at bedtime.  04/10/22 10/07/22  Alicia Amel, MD      Allergies    Penicillins and Povidone-iodine    Review of Systems   Review of Systems  Respiratory:  Positive for shortness of breath.   Cardiovascular:  Positive for chest pain.  All other systems reviewed and are negative.   Physical Exam Updated Vital Signs BP (!) 120/90 (BP Location: Right Arm)   Pulse 76   Temp (!) 97.5 F (36.4 C)   Resp 17   SpO2 100%  Physical Exam Vitals and nursing note reviewed.  Constitutional:      General: He is not in acute distress.    Appearance: Normal appearance. He is not ill-appearing, toxic-appearing or diaphoretic.  HENT:     Head: Normocephalic and atraumatic.     Nose: Nose normal.     Mouth/Throat:     Mouth: Mucous membranes are moist.     Pharynx: Oropharynx is clear.  Eyes:     Extraocular Movements: Extraocular movements intact.     Conjunctiva/sclera: Conjunctivae normal.     Pupils: Pupils are equal, round, and reactive to light.  Cardiovascular:     Rate and Rhythm: Normal rate and regular rhythm.  Pulmonary:     Effort: Pulmonary effort is normal.     Breath sounds: Normal breath sounds. No wheezing.  Abdominal:     General: Abdomen is flat.     Tenderness: There is no abdominal tenderness.  Skin:    General: Skin is warm and dry.     Capillary Refill: Capillary refill takes less than 2 seconds.  Neurological:     Mental Status: He is alert and oriented to person, place, and time. Mental status is at baseline.     ED Results / Procedures / Treatments   Labs (all labs ordered are listed, but only abnormal results are displayed) Labs Reviewed  BASIC METABOLIC PANEL - Abnormal; Notable for the following components:      Result Value   Sodium 134 (*)    Glucose, Bld 256 (*)    All other components within normal limits  CBG MONITORING, ED - Abnormal; Notable for the following components:   Glucose-Capillary 251 (*)    All other components within normal limits  CBC   ETHANOL  TROPONIN I (HIGH SENSITIVITY)  TROPONIN I (HIGH SENSITIVITY)    EKG EKG Interpretation Date/Time:  Friday March 22 2023 08:22:46 EST Ventricular Rate:  76 PR Interval:  186 QRS Duration:  98 QT Interval:  374 QTC Calculation: 421 R Axis:   72  Text Interpretation: Sinus rhythm ST elev, probable normal early repol pattern No significant change since last tracing Confirmed by Jacalyn Lefevre [id number]) on 03/22/2023 8:52:50 AM  Radiology DG Chest 2 View  Result Date: 03/22/2023 CLINICAL DATA:  Shortness of breath, chest pain. EXAM: CHEST - 2 VIEW COMPARISON:  February 20, 2023. FINDINGS: The heart size and mediastinal contours are within normal limits. Both lungs are clear. The visualized skeletal structures are unremarkable. IMPRESSION: No active cardiopulmonary disease. Electronically Signed   By: Lupita Raider M.D.   On: 03/22/2023 09:52    Procedures Procedures    Medications Ordered in ED Medications - No data to display  ED Course/ Medical Decision Making/ A&P  Medical Decision Making Amount and/or Complexity of Data Reviewed Labs: ordered. Radiology: ordered.   47 year old male presents to ED after being found outside.  Please see HPI for further details.  On examination patient rectal temperature is 96.7.  He is nontachycardic.  Lung sounds are clear bilaterally and he is not hypoxic.  His abdomen is soft and compressible.  Neurological examination is at baseline.  Alert and oriented x 4.  Patient minimally responsive/participatory on examination.  Patient does endorse "a little bit of" chest pain and shortness of breath.  Will collect CBC, BMP, troponin, chest x-ray, EKG.  Will allow the patient to rest.  Will ensure patient has dried warm clothes as well as multiple blankets.  No indication for bear hugger at this time.  Patient CBC shows no leukocytosis, no anemia.  Metabolic panel shows sodium 134, glucose 256, anion gap 10.  Alcohol level less than  10.  EKG appears nonischemic.  Troponin 6.  When the laboratory technician attempted to draw delta, the patient refused second troponin lab draw.  Patient rectal temperature has improved to 97.5 F at this time.  He has been resting in the department for 5 hours.  He will be discharged at this time.  Advised to follow-up with PCP or return with any new or worsening symptoms.  Provided dry clothes, socks.   Final Clinical Impression(s) / ED Diagnoses Final diagnoses:  Cold exposure, initial encounter    Rx / DC Orders ED Discharge Orders     None         Clent Ridges 03/22/23 1300    Jacalyn Lefevre, MD 03/22/23 1538

## 2023-03-22 NOTE — Discharge Instructions (Addendum)
Please follow-up with your PCP.  Please return to the ED with any new or worsening signs or symptoms.

## 2023-03-22 NOTE — ED Notes (Addendum)
Warm blankets provided.

## 2023-03-22 NOTE — ED Notes (Signed)
Pt eating lunch

## 2023-03-23 ENCOUNTER — Encounter (HOSPITAL_COMMUNITY): Payer: Self-pay

## 2023-03-23 ENCOUNTER — Emergency Department (HOSPITAL_COMMUNITY)
Admission: EM | Admit: 2023-03-23 | Discharge: 2023-03-23 | Disposition: A | Discharge status: Home / Self Care | Payer: PPO | Attending: Emergency Medicine | Admitting: Emergency Medicine

## 2023-03-23 DIAGNOSIS — S90821A Blister (nonthermal), right foot, initial encounter: Secondary | ICD-10-CM | POA: Diagnosis not present

## 2023-03-23 DIAGNOSIS — S90822A Blister (nonthermal), left foot, initial encounter: Secondary | ICD-10-CM | POA: Diagnosis not present

## 2023-03-23 DIAGNOSIS — X58XXXA Exposure to other specified factors, initial encounter: Secondary | ICD-10-CM | POA: Insufficient documentation

## 2023-03-23 DIAGNOSIS — S90521A Blister (nonthermal), right ankle, initial encounter: Secondary | ICD-10-CM | POA: Insufficient documentation

## 2023-03-23 DIAGNOSIS — T148XXA Other injury of unspecified body region, initial encounter: Secondary | ICD-10-CM

## 2023-03-23 DIAGNOSIS — E1140 Type 2 diabetes mellitus with diabetic neuropathy, unspecified: Secondary | ICD-10-CM | POA: Diagnosis not present

## 2023-03-23 DIAGNOSIS — S90522A Blister (nonthermal), left ankle, initial encounter: Secondary | ICD-10-CM | POA: Insufficient documentation

## 2023-03-23 DIAGNOSIS — J45909 Unspecified asthma, uncomplicated: Secondary | ICD-10-CM | POA: Insufficient documentation

## 2023-03-23 MED ORDER — IBUPROFEN 400 MG PO TABS
600.0000 mg | ORAL_TABLET | Freq: Once | ORAL | Status: AC
  Administered 2023-03-23: 600 mg via ORAL
  Filled 2023-03-23: qty 1

## 2023-03-23 NOTE — ED Provider Notes (Signed)
[place] EMERGENCY DEPARTMENT AT [facility] Provider Note   CSN: [id number] Arrival date & time: 03/23/23  0113     History  Chief Complaint  Patient presents with   Ankle Pain    Alejandro Baker is a 47 y.o. male.  Patient with past medical history significant for type II DM, asthma, homelessness, neuropathy, schizoaffective disorder presents the emergency department complaining of being cold and having ankle blisters.  He denies any other complaints at this time.   Ankle Pain      Home Medications Prior to Admission medications   Medication Sig Start Date End Date Taking? Authorizing Provider  albuterol (VENTOLIN HFA) 108 (90 Base) MCG/ACT inhaler Inhale 1-2 puffs into the lungs every 6 (six) hours as needed for wheezing or shortness of breath. 05/19/21   Prosperi, Christian H, PA-C  benzonatate (TESSALON) 100 MG capsule Take 1 capsule (100 mg total) by mouth every 8 (eight) hours. 02/18/23   Smoot, Shawn Route, PA-C  doxycycline (VIBRAMYCIN) 100 MG capsule Take 1 capsule (100 mg total) by mouth 2 (two) times daily. 02/07/23   Tilden Fossa, MD  fluticasone [facility]) 50 MCG/ACT nasal spray Place 1 spray into both nostrils daily. 05/19/21   Prosperi, Christian H, PA-C  metFORMIN (GLUCOPHAGE) 500 MG tablet Take 1 tablet (500 mg total) by mouth 2 (two) times daily with a meal. 02/07/23   Tilden Fossa, MD  naproxen (NAPROSYN) 500 MG tablet Take 1 tablet (500 mg total) by mouth 2 (two) times daily with a meal. 03/10/23   Rancour, Jeannett Senior, MD  risperiDONE (RISPERDAL) 2 MG tablet Take 1 tablet (2 mg total) by mouth at bedtime. 04/10/22 10/07/22  Alicia Amel, MD      Allergies    Penicillins and Povidone-iodine    Review of Systems   Review of Systems  Physical Exam Updated Vital Signs BP (!) 134/94   Pulse 99   Temp 98.2 F (36.8 C)   Resp 18   SpO2 98%  Physical Exam HENT:     Head: Normocephalic and atraumatic.  Eyes:     Pupils: Pupils are equal,  round, and reactive to light.  Pulmonary:     Effort: Pulmonary effort is normal. No respiratory distress.  Musculoskeletal:        General: No signs of injury.     Cervical back: Normal range of motion.  Skin:    General: Skin is dry.     Comments: Blisters noticed at ankles  Neurological:     Mental Status: He is alert.  Psychiatric:        Speech: Speech normal.        Behavior: Behavior normal.     ED Results / Procedures / Treatments   Labs (all labs ordered are listed, but only abnormal results are displayed) Labs Reviewed - No data to display  EKG None  Radiology DG Chest 2 View  Result Date: 03/22/2023 CLINICAL DATA:  Shortness of breath, chest pain. EXAM: CHEST - 2 VIEW COMPARISON:  February 20, 2023. FINDINGS: The heart size and mediastinal contours are within normal limits. Both lungs are clear. The visualized skeletal structures are unremarkable. IMPRESSION: No active cardiopulmonary disease. Electronically Signed   By: Lupita Raider M.D.   On: 03/22/2023 09:52    Procedures Procedures    Medications Ordered in ED Medications  ibuprofen (ADVIL) tablet 600 mg (600 mg Oral Given 03/23/23 0428)    ED Course/ Medical Decision Making/ A&P  Medical Decision Making  Patient presents with a chief complaint of blisters.  Patient provided with ibuprofen for pain control.  Blisters dressed by RN.  Patient eloped from the emergency department after blisters were dressed without further reassessment.        Final Clinical Impression(s) / ED Diagnoses Final diagnoses:  Friction blisters of the skin    Rx / DC Orders ED Discharge Orders     None         Pamala Duffel 03/23/23 0447    Maia Plan, MD 03/24/23 [phone]

## 2023-03-23 NOTE — ED Notes (Signed)
Petroleum gauze and gauze roll wrapped around pts ankles.

## 2023-03-23 NOTE — ED Notes (Signed)
At this time the pt walked out of his room and informed this RN he was going home. This RN informed the pt he was not up for discharge and he just kept walking out of the department. Verdell Face, PA notified.
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
## 2023-04-04 ENCOUNTER — Ambulatory Visit (HOSPITAL_COMMUNITY)
Admission: EM | Admit: 2023-04-04 | Discharge: 2023-04-04 | Disposition: A | Discharge status: Home / Self Care | Payer: Medicaid Other
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
## 2023-04-05 ENCOUNTER — Other Ambulatory Visit: Payer: Self-pay

## 2023-04-05 ENCOUNTER — Encounter (HOSPITAL_COMMUNITY): Payer: Self-pay | Admitting: Emergency Medicine

## 2023-04-05 ENCOUNTER — Emergency Department (HOSPITAL_COMMUNITY)
Admission: EM | Admit: 2023-04-05 | Discharge: 2023-04-05 | Disposition: A | Discharge status: Home / Self Care | Payer: PPO | Attending: Emergency Medicine | Admitting: Emergency Medicine

## 2023-04-05 ENCOUNTER — Emergency Department (HOSPITAL_COMMUNITY)
Admission: EM | Admit: 2023-04-05 | Discharge: 2023-04-06 | Disposition: A | Discharge status: Home / Self Care | Payer: PPO | Attending: Emergency Medicine | Admitting: Emergency Medicine

## 2023-04-05 DIAGNOSIS — F1090 Alcohol use, unspecified, uncomplicated: Secondary | ICD-10-CM | POA: Insufficient documentation

## 2023-04-05 DIAGNOSIS — E1140 Type 2 diabetes mellitus with diabetic neuropathy, unspecified: Secondary | ICD-10-CM | POA: Diagnosis not present

## 2023-04-05 DIAGNOSIS — R739 Hyperglycemia, unspecified: Secondary | ICD-10-CM | POA: Insufficient documentation

## 2023-04-05 DIAGNOSIS — Z7984 Long term (current) use of oral hypoglycemic drugs: Secondary | ICD-10-CM | POA: Diagnosis not present

## 2023-04-05 DIAGNOSIS — K1379 Other lesions of oral mucosa: Secondary | ICD-10-CM | POA: Insufficient documentation

## 2023-04-05 DIAGNOSIS — Z5321 Procedure and treatment not carried out due to patient leaving prior to being seen by health care provider: Secondary | ICD-10-CM | POA: Diagnosis not present

## 2023-04-05 DIAGNOSIS — Z5900 Homelessness unspecified: Secondary | ICD-10-CM | POA: Diagnosis not present

## 2023-04-05 DIAGNOSIS — F1490 Cocaine use, unspecified, uncomplicated: Secondary | ICD-10-CM | POA: Insufficient documentation

## 2023-04-05 DIAGNOSIS — R5383 Other fatigue: Secondary | ICD-10-CM | POA: Diagnosis not present

## 2023-04-05 DIAGNOSIS — J45909 Unspecified asthma, uncomplicated: Secondary | ICD-10-CM | POA: Diagnosis not present

## 2023-04-05 DIAGNOSIS — M79606 Pain in leg, unspecified: Secondary | ICD-10-CM | POA: Diagnosis not present

## 2023-04-05 LAB — CBC WITH DIFFERENTIAL/PLATELET
Abs Immature Granulocytes: 0.04 10*3/uL (ref 0.00–0.07)
Basophils Absolute: 0.1 10*3/uL (ref 0.0–0.1)
Basophils Relative: 1 %
Eosinophils Absolute: 0.2 10*3/uL (ref 0.0–0.5)
Eosinophils Relative: 2 %
HCT: 48.2 % (ref 39.0–52.0)
Hemoglobin: 16.3 g/dL (ref 13.0–17.0)
Immature Granulocytes: 0 %
Lymphocytes Relative: 31 %
Lymphs Abs: 3.3 10*3/uL (ref 0.7–4.0)
MCH: 30.1 pg (ref 26.0–34.0)
MCHC: 33.8 g/dL (ref 30.0–36.0)
MCV: 89.1 fL (ref 80.0–100.0)
Monocytes Absolute: 0.8 10*3/uL (ref 0.1–1.0)
Monocytes Relative: 7 %
Neutro Abs: 6.5 10*3/uL (ref 1.7–7.7)
Neutrophils Relative %: 59 %
Platelets: 365 10*3/uL (ref 150–400)
RBC: 5.41 MIL/uL (ref 4.22–5.81)
RDW: 12.2 % (ref 11.5–15.5)
WBC: 10.8 10*3/uL — ABNORMAL HIGH (ref 4.0–10.5)
nRBC: 0 % (ref 0.0–0.2)

## 2023-04-05 LAB — COMPREHENSIVE METABOLIC PANEL
ALT: 22 U/L (ref 0–44)
AST: 24 U/L (ref 15–41)
Albumin: 4.2 g/dL (ref 3.5–5.0)
Alkaline Phosphatase: 82 U/L (ref 38–126)
Anion gap: 17 — ABNORMAL HIGH (ref 5–15)
BUN: 23 mg/dL — ABNORMAL HIGH (ref 6–20)
CO2: 18 mmol/L — ABNORMAL LOW (ref 22–32)
Calcium: 9.2 mg/dL (ref 8.9–10.3)
Chloride: 93 mmol/L — ABNORMAL LOW (ref 98–111)
Creatinine, Ser: 1.16 mg/dL (ref 0.61–1.24)
GFR, Estimated: >60 mL/min (ref >60)
Glucose, Bld: 375 mg/dL — ABNORMAL HIGH (ref 70–99)
Potassium: 3.3 mmol/L — ABNORMAL LOW (ref 3.5–5.1)
Sodium: 128 mmol/L — ABNORMAL LOW (ref 135–145)
Total Bilirubin: 0.9 mg/dL (ref <1.2)
Total Protein: 7.5 g/dL (ref 6.5–8.1)

## 2023-04-05 LAB — ETHANOL: Alcohol, Ethyl (B): <10 mg/dL (ref <10)

## 2023-04-05 NOTE — ED Notes (Signed)
Pt eatting ice unable to get oral temp
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
## 2023-04-05 NOTE — ED Triage Notes (Signed)
Pt stated that he was just tired. Pt refused all vital signs and became verbally aggressive with all staff in the room.

## 2023-04-05 NOTE — ED Provider Notes (Signed)
[place] EMERGENCY DEPARTMENT AT [facility] Provider Note   CSN: [id number] Arrival date & time: 04/05/23  0000     History  Chief Complaint  Patient presents with   Fatigue    Alejandro Baker is a 47 y.o. male.  Pt is a 47 yo homeless male with pmhx significant for DM, asthma, neuropathy, schizoaffective disorder.  Pt came in last night because he is cold and homeless.  It was 20 degrees over night.  Pt did sleep in the waiting room over night.  He is feeling better now.  He has been here several times due to homeless issues.       Home Medications Prior to Admission medications   Medication Sig Start Date End Date Taking? Authorizing Provider  albuterol (VENTOLIN HFA) 108 (90 Base) MCG/ACT inhaler Inhale 1-2 puffs into the lungs every 6 (six) hours as needed for wheezing or shortness of breath. 05/19/21   Prosperi, Christian H, PA-C  benzonatate (TESSALON) 100 MG capsule Take 1 capsule (100 mg total) by mouth every 8 (eight) hours. 02/18/23   Smoot, Shawn Route, PA-C  doxycycline (VIBRAMYCIN) 100 MG capsule Take 1 capsule (100 mg total) by mouth 2 (two) times daily. 02/07/23   Tilden Fossa, MD  fluticasone [facility]) 50 MCG/ACT nasal spray Place 1 spray into both nostrils daily. 05/19/21   Prosperi, Christian H, PA-C  metFORMIN (GLUCOPHAGE) 500 MG tablet Take 1 tablet (500 mg total) by mouth 2 (two) times daily with a meal. 02/07/23   Tilden Fossa, MD  naproxen (NAPROSYN) 500 MG tablet Take 1 tablet (500 mg total) by mouth 2 (two) times daily with a meal. 03/10/23   Rancour, Jeannett Senior, MD  risperiDONE (RISPERDAL) 2 MG tablet Take 1 tablet (2 mg total) by mouth at bedtime. 04/10/22 10/07/22  Alicia Amel, MD      Allergies    Penicillins and Povidone-iodine    Review of Systems   Review of Systems  All other systems reviewed and are negative.   Physical Exam Updated Vital Signs BP 112/80 (BP Location: Right Arm)   Pulse 86   Temp 98.6 F (37 C) (Oral)    Resp 16   SpO2 98%  Physical Exam Vitals and nursing note reviewed.  Constitutional:      Appearance: Normal appearance.  HENT:     Head: Normocephalic and atraumatic.     Right Ear: External ear normal.     Left Ear: External ear normal.     Nose: Nose normal.     Mouth/Throat:     Mouth: Mucous membranes are moist.     Pharynx: Oropharynx is clear.  Eyes:     Extraocular Movements: Extraocular movements intact.     Conjunctiva/sclera: Conjunctivae normal.     Pupils: Pupils are equal, round, and reactive to light.  Cardiovascular:     Rate and Rhythm: Normal rate and regular rhythm.     Pulses: Normal pulses.     Heart sounds: Normal heart sounds.  Pulmonary:     Effort: Pulmonary effort is normal.     Breath sounds: Normal breath sounds.  Abdominal:     General: Abdomen is flat. Bowel sounds are normal.     Palpations: Abdomen is soft.  Musculoskeletal:        General: Normal range of motion.     Cervical back: Normal range of motion and neck supple.  Skin:    General: Skin is warm.     Capillary Refill: Capillary refill  takes less than 2 seconds.  Neurological:     General: No focal deficit present.     Mental Status: He is alert and oriented to person, place, and time.  Psychiatric:        Mood and Affect: Mood normal.     ED Results / Procedures / Treatments   Labs (all labs ordered are listed, but only abnormal results are displayed) Labs Reviewed - No data to display  EKG None  Radiology No results found.  Procedures Procedures    Medications Ordered in ED Medications - No data to display  ED Course/ Medical Decision Making/ A&P                                 Medical Decision Making  Pt feels well after sleeping in the waiting room over night.  He is stable for d/c.  Return if worse.  Shelter info provided.        Final Clinical Impression(s) / ED Diagnoses Final diagnoses:  Homeless    Rx / DC Orders ED Discharge Orders      None         Jacalyn Lefevre, MD 04/05/23 [phone]

## 2023-04-05 NOTE — ED Notes (Signed)
Pt called for vitals with no response. Moved OTF.

## 2023-04-05 NOTE — ED Notes (Signed)
Pt in triage making aggressive gestures and comments towards this NT. Pt refused EKG.  RN made aware.

## 2023-04-06 ENCOUNTER — Other Ambulatory Visit: Payer: Self-pay

## 2023-04-06 ENCOUNTER — Encounter (HOSPITAL_COMMUNITY): Payer: Self-pay | Admitting: Emergency Medicine

## 2023-04-06 ENCOUNTER — Emergency Department (HOSPITAL_COMMUNITY)
Admission: EM | Admit: 2023-04-06 | Discharge: 2023-04-06 | Discharge status: Against Medical Advice | Payer: PPO | Source: Home / Self Care

## 2023-04-06 DIAGNOSIS — Z5321 Procedure and treatment not carried out due to patient leaving prior to being seen by health care provider: Secondary | ICD-10-CM | POA: Insufficient documentation

## 2023-04-06 DIAGNOSIS — M79606 Pain in leg, unspecified: Secondary | ICD-10-CM | POA: Insufficient documentation

## 2023-04-06 NOTE — ED Notes (Signed)
Refusing vitals
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
## 2023-04-06 NOTE — ED Provider Notes (Signed)
[place] EMERGENCY DEPARTMENT AT [facility] Provider Note   CSN: [id number] Arrival date & time: 04/05/23  1840     History  Chief Complaint  Patient presents with   Mouth Injury    Alejandro Baker is a 47 y.o. male.  The history is provided by the patient.  Mouth Injury The current episode started more than 1 week ago. The problem occurs constantly. The problem has not changed since onset.Pertinent negatives include no chest pain and no shortness of breath. Nothing aggravates the symptoms. Nothing relieves the symptoms. He has tried nothing for the symptoms. The treatment provided no relief.       Home Medications Prior to Admission medications   Medication Sig Start Date End Date Taking? Authorizing Provider  albuterol (VENTOLIN HFA) 108 (90 Base) MCG/ACT inhaler Inhale 1-2 puffs into the lungs every 6 (six) hours as needed for wheezing or shortness of breath. 05/19/21   Prosperi, Christian H, PA-C  benzonatate (TESSALON) 100 MG capsule Take 1 capsule (100 mg total) by mouth every 8 (eight) hours. 02/18/23   Smoot, Shawn Route, PA-C  doxycycline (VIBRAMYCIN) 100 MG capsule Take 1 capsule (100 mg total) by mouth 2 (two) times daily. 02/07/23   Tilden Fossa, MD  fluticasone [facility]) 50 MCG/ACT nasal spray Place 1 spray into both nostrils daily. 05/19/21   Prosperi, Christian H, PA-C  metFORMIN (GLUCOPHAGE) 500 MG tablet Take 1 tablet (500 mg total) by mouth 2 (two) times daily with a meal. 02/07/23   Tilden Fossa, MD  naproxen (NAPROSYN) 500 MG tablet Take 1 tablet (500 mg total) by mouth 2 (two) times daily with a meal. 03/10/23   Rancour, Jeannett Senior, MD  risperiDONE (RISPERDAL) 2 MG tablet Take 1 tablet (2 mg total) by mouth at bedtime. 04/10/22 10/07/22  Alicia Amel, MD      Allergies    Penicillins and Povidone-iodine    Review of Systems   Review of Systems  Constitutional:  Negative for fever.  HENT:  Negative for dental problem, facial swelling and sore  throat.   Eyes:  Negative for photophobia.  Respiratory:  Negative for shortness of breath, wheezing and stridor.   Cardiovascular:  Negative for chest pain.  All other systems reviewed and are negative.   Physical Exam Updated Vital Signs BP (!) 122/93 (BP Location: Right Arm)   Pulse (!) 105   Resp 16   SpO2 100%  Physical Exam Vitals and nursing note reviewed.  Constitutional:      General: He is not in acute distress.    Appearance: Normal appearance. He is well-developed. He is not diaphoretic.  HENT:     Head: Normocephalic and atraumatic.     Nose: Nose normal.     Mouth/Throat:     Mouth: Mucous membranes are moist.  Eyes:     Extraocular Movements: Extraocular movements intact.     Conjunctiva/sclera: Conjunctivae normal.     Pupils: Pupils are equal, round, and reactive to light.  Cardiovascular:     Rate and Rhythm: Normal rate and regular rhythm.     Pulses: Normal pulses.     Heart sounds: Normal heart sounds.  Pulmonary:     Effort: Pulmonary effort is normal.     Breath sounds: Normal breath sounds. No wheezing or rales.  Abdominal:     General: Bowel sounds are normal.     Palpations: Abdomen is soft.     Tenderness: There is no abdominal tenderness. There is no guarding or  rebound.  Musculoskeletal:        General: Normal range of motion.     Cervical back: Normal range of motion and neck supple.  Skin:    General: Skin is warm and dry.     Capillary Refill: Capillary refill takes less than 2 seconds.  Neurological:     General: No focal deficit present.     Mental Status: He is alert and oriented to person, place, and time.     Deep Tendon Reflexes: Reflexes normal.  Psychiatric:        Mood and Affect: Mood normal.        Behavior: Behavior normal.     ED Results / Procedures / Treatments   Labs (all labs ordered are listed, but only abnormal results are displayed) Results for orders placed or performed during the hospital encounter of  04/05/23  CBC with Differential  Result Value Ref Range   WBC 10.8 (H) 4.0 - 10.5 K/uL   RBC 5.41 4.22 - 5.81 MIL/uL   Hemoglobin 16.3 13.0 - 17.0 g/dL   HCT 16.1 09.6 - 04.5 %   MCV 89.1 80.0 - 100.0 fL   MCH 30.1 26.0 - 34.0 pg   MCHC 33.8 30.0 - 36.0 g/dL   RDW 40.9 81.1 - 91.4 %   Platelets 365 150 - 400 K/uL   nRBC 0.0 0.0 - 0.2 %   Neutrophils Relative % 59 %   Neutro Abs 6.5 1.7 - 7.7 K/uL   Lymphocytes Relative 31 %   Lymphs Abs 3.3 0.7 - 4.0 K/uL   Monocytes Relative 7 %   Monocytes Absolute 0.8 0.1 - 1.0 K/uL   Eosinophils Relative 2 %   Eosinophils Absolute 0.2 0.0 - 0.5 K/uL   Basophils Relative 1 %   Basophils Absolute 0.1 0.0 - 0.1 K/uL   Immature Granulocytes 0 %   Abs Immature Granulocytes 0.04 0.00 - 0.07 K/uL  Comprehensive metabolic panel  Result Value Ref Range   Sodium 128 (L) 135 - 145 mmol/L   Potassium 3.3 (L) 3.5 - 5.1 mmol/L   Chloride 93 (L) 98 - 111 mmol/L   CO2 18 (L) 22 - 32 mmol/L   Glucose, Bld 375 (H) 70 - 99 mg/dL   BUN 23 (H) 6 - 20 mg/dL   Creatinine, Ser 7.82 0.61 - 1.24 mg/dL   Calcium 9.2 8.9 - 95.6 mg/dL   Total Protein 7.5 6.5 - 8.1 g/dL   Albumin 4.2 3.5 - 5.0 g/dL   AST 24 15 - 41 U/L   ALT 22 0 - 44 U/L   Alkaline Phosphatase 82 38 - 126 U/L   Total Bilirubin 0.9 <1.2 mg/dL   GFR, Estimated >21 >30 mL/min   Anion gap 17 (H) 5 - 15  Ethanol  Result Value Ref Range   Alcohol, Ethyl (B) <10 <10 mg/dL   DG Chest 2 View  Result Date: 03/22/2023 CLINICAL DATA:  Shortness of breath, chest pain. EXAM: CHEST - 2 VIEW COMPARISON:  February 20, 2023. FINDINGS: The heart size and mediastinal contours are within normal limits. Both lungs are clear. The visualized skeletal structures are unremarkable. IMPRESSION: No active cardiopulmonary disease. Electronically Signed   By: Lupita Raider M.D.   On: 03/22/2023 09:52   DG Foot 2 Views Left  Result Date: 03/10/2023 CLINICAL DATA:  Rule out foreign body EXAM: RIGHT FOOT - 2 VIEW; LEFT  FOOT - 2 VIEW COMPARISON:  None Available. FINDINGS: Small osseous fragment lateral  to the distal tuft of the right fourth distal phalanx suspicious for acute fracture. Soft tissue swelling about the right fourth toe. No acute fracture or dislocation on the left. Bilateral calcaneal spurs. No radiopaque foreign body. IMPRESSION: 1. Small osseous fragment lateral to the distal tuft of the right fourth distal phalanx suspicious for acute fracture. 2. No acute fracture or dislocation on the left. 3. No radiopaque foreign body. Electronically Signed   By: Minerva Fester M.D.   On: 03/10/2023 02:07   DG Foot 2 Views Right  Result Date: 03/10/2023 CLINICAL DATA:  Rule out foreign body EXAM: RIGHT FOOT - 2 VIEW; LEFT FOOT - 2 VIEW COMPARISON:  None Available. FINDINGS: Small osseous fragment lateral to the distal tuft of the right fourth distal phalanx suspicious for acute fracture. Soft tissue swelling about the right fourth toe. No acute fracture or dislocation on the left. Bilateral calcaneal spurs. No radiopaque foreign body. IMPRESSION: 1. Small osseous fragment lateral to the distal tuft of the right fourth distal phalanx suspicious for acute fracture. 2. No acute fracture or dislocation on the left. 3. No radiopaque foreign body. Electronically Signed   By: Minerva Fester M.D.   On: 03/10/2023 02:07     Radiology No results found.  Procedures Procedures    Medications Ordered in ED Medications - No data to display  ED Course/ Medical Decision Making/ A&P                                 Medical Decision Making Amount and/or Complexity of Data Reviewed External Data Reviewed: notes.    Details: Previous notes reviewed  Labs: ordered.    Details: White count 10.8, normal hemoglobin 16.3, normal platelets. Sodium slight loqw 128, normal potassium, elevated glucose 375, normal creatinine 1.16   Risk Risk Details: Already seen today and multiple times this week but did not mention this is  refusing to comply with detailed oral exam.  As this is not an acute issues this week patient is stable for discharge.      Final Clinical Impression(s) / ED Diagnoses Final diagnoses:  Mouth pain   Return for intractable cough, coughing up blood, fevers > 100.4 unrelieved by medication, shortness of breath, intractable vomiting, chest pain, shortness of breath, weakness, numbness, changes in speech, facial asymmetry, abdominal pain, passing out, Inability to tolerate liquids or food, cough, altered mental status or any concerns. No signs of systemic illness or infection. The patient is nontoxic-appearing on exam and vital signs are within normal limits.  I have reviewed the triage vital signs and the nursing notes. Pertinent labs & imaging results that were available during my care of the patient were reviewed by me and considered in my medical decision making (see chart for details). After history, exam, and medical workup I feel the patient has been appropriately medically screened and is safe for discharge home. Pertinent diagnoses were discussed with the patient. Patient was given return precautions.    Rx / DC Orders ED Discharge Orders     None         Keola Heninger, MD 04/06/23 2536

## 2023-04-06 NOTE — ED Triage Notes (Signed)
Pt states he wants to be checked out for possible hypothermia. Pt states he's homeless and sleeping on the streets. C/o leg pains. Pt ambulatory in triage.

## 2023-04-06 NOTE — ED Notes (Signed)
Called pt, no answer.

## 2023-04-06 NOTE — ED Notes (Signed)
Pt sleeping, this RN tried to wake up pt multiple times to review AVS. Pt refuses to roll over, offered vital signs, and continues to stay "stop it, stop it." Pt continues after multiple attempts, security paged to walk pt out.

## 2023-04-07 ENCOUNTER — Emergency Department (HOSPITAL_COMMUNITY)
Admission: EM | Admit: 2023-04-07 | Discharge: 2023-04-07 | Disposition: A | Discharge status: Home / Self Care | Payer: PPO | Attending: Emergency Medicine | Admitting: Emergency Medicine

## 2023-04-07 ENCOUNTER — Other Ambulatory Visit: Payer: Self-pay

## 2023-04-07 ENCOUNTER — Encounter (HOSPITAL_COMMUNITY): Payer: Self-pay

## 2023-04-07 DIAGNOSIS — Z79899 Other long term (current) drug therapy: Secondary | ICD-10-CM | POA: Insufficient documentation

## 2023-04-07 DIAGNOSIS — M25569 Pain in unspecified knee: Secondary | ICD-10-CM | POA: Diagnosis not present

## 2023-04-07 DIAGNOSIS — I10 Essential (primary) hypertension: Secondary | ICD-10-CM | POA: Insufficient documentation

## 2023-04-07 DIAGNOSIS — E1165 Type 2 diabetes mellitus with hyperglycemia: Secondary | ICD-10-CM | POA: Insufficient documentation

## 2023-04-07 DIAGNOSIS — Z7984 Long term (current) use of oral hypoglycemic drugs: Secondary | ICD-10-CM | POA: Diagnosis not present

## 2023-04-07 DIAGNOSIS — Z5900 Homelessness unspecified: Secondary | ICD-10-CM | POA: Insufficient documentation

## 2023-04-07 DIAGNOSIS — M7918 Myalgia, other site: Secondary | ICD-10-CM | POA: Diagnosis not present

## 2023-04-07 DIAGNOSIS — R739 Hyperglycemia, unspecified: Secondary | ICD-10-CM

## 2023-04-07 DIAGNOSIS — M791 Myalgia, unspecified site: Secondary | ICD-10-CM | POA: Diagnosis not present

## 2023-04-07 LAB — CBG MONITORING, ED: Glucose-Capillary: 365 mg/dL — ABNORMAL HIGH (ref 70–99)

## 2023-04-07 MED ORDER — ACETAMINOPHEN 500 MG PO TABS
1000.0000 mg | ORAL_TABLET | Freq: Once | ORAL | Status: AC
  Administered 2023-04-07: 1000 mg via ORAL
  Filled 2023-04-07: qty 2

## 2023-04-07 MED ORDER — METFORMIN HCL 500 MG PO TABS
500.0000 mg | ORAL_TABLET | Freq: Once | ORAL | Status: AC
  Administered 2023-04-07: 500 mg via ORAL
  Filled 2023-04-07: qty 1

## 2023-04-07 NOTE — ED Provider Notes (Signed)
[place] EMERGENCY DEPARTMENT AT [facility] Provider Note   CSN: [id number] Arrival date & time: 04/07/23  0117     History  Chief Complaint  Patient presents with   Knee Pain    Alejandro Baker is a 47 y.o. male.  47  y/o male with hx of diabetes, hypertension, schizoaffective disorder presents to the ED via EMS for c/o "back arm pain". Reporting shoulder pain in triage. No repeat complaints of knee pain. Denies trauma or falls. Is homeless and came after a local hotel called EMS when patient went into the lobby asking for help. Seen yesterday at [facility] for cold exposure.   Knee Pain     Home Medications Prior to Admission medications   Medication Sig Start Date End Date Taking? Authorizing Provider  albuterol (VENTOLIN HFA) 108 (90 Base) MCG/ACT inhaler Inhale 1-2 puffs into the lungs every 6 (six) hours as needed for wheezing or shortness of breath. 05/19/21   Prosperi, Christian H, PA-C  benzonatate (TESSALON) 100 MG capsule Take 1 capsule (100 mg total) by mouth every 8 (eight) hours. 02/18/23   Smoot, Shawn Route, PA-C  doxycycline (VIBRAMYCIN) 100 MG capsule Take 1 capsule (100 mg total) by mouth 2 (two) times daily. 02/07/23   Tilden Fossa, MD  fluticasone [facility]) 50 MCG/ACT nasal spray Place 1 spray into both nostrils daily. 05/19/21   Prosperi, Christian H, PA-C  metFORMIN (GLUCOPHAGE) 500 MG tablet Take 1 tablet (500 mg total) by mouth 2 (two) times daily with a meal. 02/07/23   Tilden Fossa, MD  naproxen (NAPROSYN) 500 MG tablet Take 1 tablet (500 mg total) by mouth 2 (two) times daily with a meal. 03/10/23   Rancour, Jeannett Senior, MD  risperiDONE (RISPERDAL) 2 MG tablet Take 1 tablet (2 mg total) by mouth at bedtime. 04/10/22 10/07/22  Alicia Amel, MD      Allergies    Penicillins and Povidone-iodine    Review of Systems   Review of Systems Ten systems reviewed and are negative for acute change, except as noted in the HPI.    Physical Exam Updated  Vital Signs BP (!) 142/78   Pulse (!) 56   Temp 97.8 F (36.6 C) (Oral)   Resp 15   SpO2 96%   Physical Exam Vitals and nursing note reviewed.  Constitutional:      General: He is not in acute distress.    Appearance: He is well-developed. He is not diaphoretic.     Comments: Disheveled, nontoxic appearing. Tanned skin.  HENT:     Head: Normocephalic and atraumatic.  Eyes:     General: No scleral icterus.    Conjunctiva/sclera: Conjunctivae normal.  Pulmonary:     Effort: Pulmonary effort is normal. No respiratory distress.     Comments: Respirations even and unlabored Musculoskeletal:        General: Normal range of motion.     Cervical back: Normal range of motion.     Comments: Able to raise both arms above his head w/o difficulty or pain.  Skin:    General: Skin is warm and dry.     Coloration: Skin is not pale.     Findings: No erythema or rash.  Neurological:     Mental Status: He is alert and oriented to person, place, and time.     Coordination: Coordination normal.     Comments: Moving all extremities spontaneously.  Psychiatric:        Behavior: Behavior normal.  ED Results / Procedures / Treatments   Labs (all labs ordered are listed, but only abnormal results are displayed) Labs Reviewed  CBG MONITORING, ED - Abnormal; Notable for the following components:      Result Value   Glucose-Capillary 365 (*)    All other components within normal limits    EKG None  Radiology No results found.  Procedures Procedures    Medications Ordered in ED Medications  acetaminophen (TYLENOL) tablet 1,000 mg (1,000 mg Oral Given 04/07/23 0202)  metFORMIN (GLUCOPHAGE) tablet 500 mg (500 mg Oral Given 04/07/23 0227)    ED Course/ Medical Decision Making/ A&P Clinical Course as of 04/07/23 0557  Sun Apr 07, 2023  0219 Glucose-Capillary(!): 365 In keeping with historic baseline. Will give daily metformin dose given unclear compliance with medications. [KH]     Clinical Course User Index [KH] Antony Madura, PA-C                                 Medical Decision Making Amount and/or Complexity of Data Reviewed Labs:  Decision-making details documented in ED Course.  Risk OTC drugs. Prescription drug management.   This patient presents to the ED for concern of MSK pain, this involves an extensive number of treatment options, and is a complaint that carries with it a high risk of complications and morbidity.  The differential diagnosis includes sprain/strain vs fracture vs dislocation vs septic joint.   Co morbidities that complicate the patient evaluation  DM HTN Schizoaffective   Additional history obtained:  Additional history obtained from EMS personnel External records from outside source obtained and reviewed including historical lab values to establish baseline.   Lab Tests:  I Ordered, and personally interpreted labs.  The pertinent results include:  CBG 365   Cardiac Monitoring:  The patient was maintained on a cardiac monitor.  I personally viewed and interpreted the cardiac monitored which showed an underlying rhythm of: NSR   Medicines ordered and prescription drug management:  I ordered medication including Metformin for hyperglycemia. Tylenol given for MSK pain.  Reevaluation of the patient after these medicines showed that the patient  remained stable I have reviewed the patients home medicines and have made adjustments as needed   Test Considered:  BMP   Problem List / ED Course:  Neurovascularly intact.  No reproducible pain or acute distress.  No history of trauma.  CBG is consistent with baseline.  Given dose of daily metformin in light of unclear medication compliance.  Allowed to sleep in the emergency department until the buses began running this AM.  Stable for discharge.   Reevaluation:  After the interventions noted above, I reevaluated the patient and found that they have :stayed the  same   Social Determinants of Health:  Homeless    Dispostion:  After consideration of the diagnostic results and the patients response to treatment, I feel that the patent would benefit from outpatient PCP follow up PRN. Return precautions discussed and provided. Patient discharged in stable condition with no unaddressed concerns.          Final Clinical Impression(s) / ED Diagnoses Final diagnoses:  Musculoskeletal pain  Hyperglycemia    Rx / DC Orders ED Discharge Orders     None         Antony Madura, PA-C 04/07/23 0600    Nira Conn, MD 04/07/23 [phone]

## 2023-04-07 NOTE — ED Notes (Signed)
Pt provided warm blankets, diet ginger ale and crackers and is sleeping. Pt advised to make staff aware of any needs. Pt expresses verbal understanding

## 2023-04-07 NOTE — ED Notes (Signed)
Pt provided sandwich, Ginger ale, and bus pass prior to discharge.

## 2023-04-07 NOTE — ED Triage Notes (Signed)
Pt BIB [facility] for bilateral knee and shoulder pain. Pt is homeless and local hotel called after pt walked into lobby asking for help. Pt is alert and oriented and cooperative.

## 2023-04-07 NOTE — ED Notes (Signed)
Pt refusing vitals. Pt states "I'm too cold." RN made aware.

## 2023-04-07 NOTE — Discharge Instructions (Addendum)
Follow-up with a primary care doctor.

## 2023-04-08 ENCOUNTER — Encounter (HOSPITAL_COMMUNITY): Payer: Self-pay

## 2023-04-08 ENCOUNTER — Emergency Department (HOSPITAL_COMMUNITY)
Admission: EM | Admit: 2023-04-08 | Discharge: 2023-04-09 | Disposition: A | Discharge status: Home / Self Care | Payer: PPO | Source: Home / Self Care | Attending: Emergency Medicine | Admitting: Emergency Medicine

## 2023-04-08 ENCOUNTER — Other Ambulatory Visit: Payer: Self-pay

## 2023-04-08 ENCOUNTER — Emergency Department (HOSPITAL_COMMUNITY)
Admission: EM | Admit: 2023-04-08 | Discharge: 2023-04-08 | Disposition: A | Discharge status: Home / Self Care | Payer: PPO | Attending: Emergency Medicine | Admitting: Emergency Medicine

## 2023-04-08 ENCOUNTER — Emergency Department (HOSPITAL_COMMUNITY)
Admission: EM | Admit: 2023-04-08 | Discharge: 2023-04-08 | Disposition: A | Discharge status: Home / Self Care | Payer: PPO | Source: Home / Self Care

## 2023-04-08 DIAGNOSIS — E1165 Type 2 diabetes mellitus with hyperglycemia: Secondary | ICD-10-CM | POA: Insufficient documentation

## 2023-04-08 DIAGNOSIS — X31XXXA Exposure to excessive natural cold, initial encounter: Secondary | ICD-10-CM | POA: Diagnosis not present

## 2023-04-08 DIAGNOSIS — I10 Essential (primary) hypertension: Secondary | ICD-10-CM | POA: Insufficient documentation

## 2023-04-08 DIAGNOSIS — Z7984 Long term (current) use of oral hypoglycemic drugs: Secondary | ICD-10-CM | POA: Insufficient documentation

## 2023-04-08 DIAGNOSIS — Z5900 Homelessness unspecified: Secondary | ICD-10-CM | POA: Insufficient documentation

## 2023-04-08 DIAGNOSIS — R1111 Vomiting without nausea: Secondary | ICD-10-CM | POA: Diagnosis not present

## 2023-04-08 DIAGNOSIS — R112 Nausea with vomiting, unspecified: Secondary | ICD-10-CM | POA: Insufficient documentation

## 2023-04-08 DIAGNOSIS — T699XXA Effect of reduced temperature, unspecified, initial encounter: Secondary | ICD-10-CM | POA: Insufficient documentation

## 2023-04-08 DIAGNOSIS — T698XXA Other specified effects of reduced temperature, initial encounter: Secondary | ICD-10-CM | POA: Diagnosis not present

## 2023-04-08 DIAGNOSIS — R748 Abnormal levels of other serum enzymes: Secondary | ICD-10-CM | POA: Insufficient documentation

## 2023-04-08 DIAGNOSIS — R739 Hyperglycemia, unspecified: Secondary | ICD-10-CM

## 2023-04-08 DIAGNOSIS — R110 Nausea: Secondary | ICD-10-CM | POA: Diagnosis not present

## 2023-04-08 NOTE — ED Triage Notes (Signed)
Pt BIB EMS with reports of nausea. Pt reports using cocaine today.

## 2023-04-08 NOTE — ED Triage Notes (Signed)
Pt. Arrives via [facility] stating that he is cold and wants a bed to sleep in.

## 2023-04-08 NOTE — ED Triage Notes (Signed)
Pt. Arrives for the second time today, requesting to be seen for his schizophrenia.

## 2023-04-08 NOTE — ED Notes (Signed)
Attempted to discharge Pt x3. Pt refusing to get out of bed and leave. Will call security

## 2023-04-08 NOTE — ED Provider Triage Note (Signed)
Emergency Medicine Provider Triage Evaluation Note  Alejandro Baker , a 47 y.o. male  was evaluated in triage.  Pt complains of nausea and generalized muscle aches.  Review of Systems  Positive: Nausea, generalized muscle aches, vomiting Negative: Reports he is tolerating p.o., no diarrhea no abdominal pain no chest pain or shortness of breath  Physical Exam  BP (!) 143/89 (BP Location: Right Arm)   Pulse 92   Temp 98.9 F (37.2 C) (Oral)   Resp 18   SpO2 94%  Gen:   Awake, no distress   Resp:  Normal effort  MSK:   Moves extremities without difficulty  Other:    Medical Decision Making  Medically screening exam initiated at 10:33 PM.  Appropriate orders placed.  Alejandro Baker was informed that the remainder of the evaluation will be completed by another provider, this initial triage assessment does not replace that evaluation, and the importance of remaining in the ED until their evaluation is complete.  Reports he was just seen here for similar thing.  Has past medical history of homelessness, diabetes, schizoaffective.  Patient reports that he has been nauseous and vomiting.  Denies alcohol use or drug use.  Well-appearing with normal vital signs.   Alejandro Knudsen, PA-C 04/08/23 2234

## 2023-04-08 NOTE — Discharge Instructions (Signed)
Please follow-up with your PCP as needed. See the list for shelters.

## 2023-04-08 NOTE — ED Provider Notes (Signed)
[place] EMERGENCY DEPARTMENT AT [facility] Provider Note   CSN: [id number] Arrival date & time: 04/08/23  2202     History  Chief Complaint  Patient presents with   Cold Exposure    Alejandro Baker is a 47 y.o. male.  The history is provided by the patient.  Patient BIN EMS because he wants a placed to sleep.  He states he has no medical complaints at this time.  No SI or HI.       Home Medications Prior to Admission medications   Medication Sig Start Date End Date Taking? Authorizing Provider  albuterol (VENTOLIN HFA) 108 (90 Base) MCG/ACT inhaler Inhale 1-2 puffs into the lungs every 6 (six) hours as needed for wheezing or shortness of breath. 05/19/21   Baker, Alejandro H, PA-C  benzonatate (TESSALON) 100 MG capsule Take 1 capsule (100 mg total) by mouth every 8 (eight) hours. 02/18/23   Baker, Alejandro Route, PA-C  doxycycline (VIBRAMYCIN) 100 MG capsule Take 1 capsule (100 mg total) by mouth 2 (two) times daily. 02/07/23   Alejandro Fossa, MD  fluticasone [facility]) 50 MCG/ACT nasal spray Place 1 spray into both nostrils daily. 05/19/21   Baker, Alejandro H, PA-C  metFORMIN (GLUCOPHAGE) 500 MG tablet Take 1 tablet (500 mg total) by mouth 2 (two) times daily with a meal. 02/07/23   Alejandro Fossa, MD  naproxen (NAPROSYN) 500 MG tablet Take 1 tablet (500 mg total) by mouth 2 (two) times daily with a meal. 03/10/23   Baker, Alejandro Senior, MD  risperiDONE (RISPERDAL) 2 MG tablet Take 1 tablet (2 mg total) by mouth at bedtime. 04/10/22 10/07/22  Alejandro Amel, MD      Allergies    Penicillins and Povidone-iodine    Review of Systems   Review of Systems  Constitutional:  Negative for fever.  HENT:  Negative for facial swelling.   Respiratory:  Negative for wheezing and stridor.   Cardiovascular:  Negative for chest pain.  All other systems reviewed and are negative.   Physical Exam Updated Vital Signs BP 103/72   Pulse 84   Temp 98.4 F (36.9 C) (Oral)   Resp  14   SpO2 99%  Physical Exam Vitals and nursing note reviewed.  Constitutional:      General: He is not in acute distress.    Appearance: Normal appearance. He is well-developed. He is not diaphoretic.  HENT:     Head: Normocephalic and atraumatic.     Nose: Nose normal.  Eyes:     Conjunctiva/sclera: Conjunctivae normal.     Pupils: Pupils are equal, round, and reactive to light.  Cardiovascular:     Rate and Rhythm: Normal rate and regular rhythm.  Pulmonary:     Effort: Pulmonary effort is normal.     Breath sounds: Normal breath sounds. No wheezing or rales.  Abdominal:     General: Bowel sounds are normal.     Palpations: Abdomen is soft.     Tenderness: There is no abdominal tenderness. There is no guarding or rebound.  Musculoskeletal:        General: Normal range of motion.     Cervical back: Normal range of motion and neck supple.  Skin:    General: Skin is warm and dry.     Capillary Refill: Capillary refill takes less than 2 seconds.  Neurological:     General: No focal deficit present.     Mental Status: He is alert and oriented to person, place,  and time.     Deep Tendon Reflexes: Reflexes normal.  Psychiatric:        Mood and Affect: Mood normal.     ED Results / Procedures / Treatments   Labs (all labs ordered are listed, but only abnormal results are displayed) Labs Reviewed - No data to display  EKG None  Radiology No results found.  Procedures Procedures    Medications Ordered in ED Medications - No data to display  ED Course/ Medical Decision Making/ A&P                                 Medical Decision Making Patient BIB EMS for being homeless   Amount and/or Complexity of Data Reviewed Independent Historian: EMS    Details: See above  External Data Reviewed: notes.    Details: Previous notes reviewed   Risk Risk Details: PO challenged.  No medical issues at this time.  No emergencies.  Stable for discharge.      Final  Clinical Impression(s) / ED Diagnoses Final diagnoses:  Homelessness   Return for intractable cough, coughing up blood, fevers > 100.4 unrelieved by medication, shortness of breath, intractable vomiting, chest pain, shortness of breath, weakness, numbness, changes in speech, facial asymmetry, abdominal pain, passing out, Inability to tolerate liquids or food, cough, altered mental status or any concerns. No signs of systemic illness or infection. The patient is nontoxic-appearing on exam and vital signs are within normal limits.  I have reviewed the triage vital signs and the nursing notes. Pertinent labs & imaging results that were available during my care of the patient were reviewed by me and considered in my medical decision making (see chart for details). After history, exam, and medical workup I feel the patient has been appropriately medically screened and is safe for discharge home. Pertinent diagnoses were discussed with the patient. Patient was given return precautions.  Rx / DC Orders ED Discharge Orders     None         Arinze Rivadeneira, MD 04/08/23 [phone]

## 2023-04-08 NOTE — ED Provider Notes (Signed)
[place] EMERGENCY DEPARTMENT AT [facility] Provider Note   CSN: [id number] Arrival date & time: 04/08/23  1027     History  Chief Complaint  Patient presents with   Mental Health Problem    Alejandro Baker is a 47 y.o. male, history of schizoaffective disorder, who is here because he would like a place to sleep.  He has no other complaints.  He states it is raining outside and he does not want to be outside.    Home Medications Prior to Admission medications   Medication Sig Start Date End Date Taking? Authorizing Provider  albuterol (VENTOLIN HFA) 108 (90 Base) MCG/ACT inhaler Inhale 1-2 puffs into the lungs every 6 (six) hours as needed for wheezing or shortness of breath. 05/19/21   Prosperi, Christian H, PA-C  benzonatate (TESSALON) 100 MG capsule Take 1 capsule (100 mg total) by mouth every 8 (eight) hours. 02/18/23   Smoot, Shawn Route, PA-C  doxycycline (VIBRAMYCIN) 100 MG capsule Take 1 capsule (100 mg total) by mouth 2 (two) times daily. 02/07/23   Tilden Fossa, MD  fluticasone [facility]) 50 MCG/ACT nasal spray Place 1 spray into both nostrils daily. 05/19/21   Prosperi, Christian H, PA-C  metFORMIN (GLUCOPHAGE) 500 MG tablet Take 1 tablet (500 mg total) by mouth 2 (two) times daily with a meal. 02/07/23   Tilden Fossa, MD  naproxen (NAPROSYN) 500 MG tablet Take 1 tablet (500 mg total) by mouth 2 (two) times daily with a meal. 03/10/23   Rancour, Jeannett Senior, MD  risperiDONE (RISPERDAL) 2 MG tablet Take 1 tablet (2 mg total) by mouth at bedtime. 04/10/22 10/07/22  Alicia Amel, MD      Allergies    Penicillins and Povidone-iodine    Review of Systems   Review of Systems  Physical Exam Updated Vital Signs BP 126/86   Pulse 74   Temp 97.9 F (36.6 C) (Oral)   Resp 16   SpO2 98%  Physical Exam Vitals and nursing note reviewed.  Constitutional:      General: He is not in acute distress.    Appearance: He is well-developed.     Comments: Disheveled    HENT:     Head: Normocephalic and atraumatic.  Eyes:     Conjunctiva/sclera: Conjunctivae normal.  Cardiovascular:     Rate and Rhythm: Normal rate and regular rhythm.     Heart sounds: No murmur heard. Pulmonary:     Effort: Pulmonary effort is normal. No respiratory distress.     Breath sounds: Normal breath sounds.  Abdominal:     Palpations: Abdomen is soft.     Tenderness: There is no abdominal tenderness.  Musculoskeletal:        General: No swelling.     Cervical back: Neck supple.  Skin:    General: Skin is warm and dry.     Capillary Refill: Capillary refill takes less than 2 seconds.  Neurological:     Mental Status: He is alert.  Psychiatric:        Mood and Affect: Mood normal.     ED Results / Procedures / Treatments   Labs (all labs ordered are listed, but only abnormal results are displayed) Labs Reviewed - No data to display  EKG None  Radiology No results found.  Procedures Procedures    Medications Ordered in ED Medications - No data to display  ED Course/ Medical Decision Making/ A&P  Medical Decision Making Patient is a 47 year old male, history of schizoaffective disorder, here because he is homeless.  He has no complaints other than he wants to sleep in the ER.  I provided him with resources for shelters, and discharged him.  He has no medical complaints.    Final Clinical Impression(s) / ED Diagnoses Final diagnoses:  Homelessness    Rx / DC Orders ED Discharge Orders     None         Ladawn Boullion, Harley Alto, PA 04/08/23 0719    Arby Barrette, MD 04/08/23 [phone]

## 2023-04-09 ENCOUNTER — Emergency Department (HOSPITAL_COMMUNITY)
Admission: EM | Admit: 2023-04-09 | Discharge: 2023-04-10 | Disposition: A | Discharge status: Home / Self Care | Payer: PPO | Attending: Emergency Medicine | Admitting: Emergency Medicine

## 2023-04-09 ENCOUNTER — Other Ambulatory Visit: Payer: Self-pay

## 2023-04-09 ENCOUNTER — Encounter (HOSPITAL_COMMUNITY): Payer: Self-pay

## 2023-04-09 DIAGNOSIS — T698XXA Other specified effects of reduced temperature, initial encounter: Secondary | ICD-10-CM | POA: Diagnosis not present

## 2023-04-09 DIAGNOSIS — T699XXA Effect of reduced temperature, unspecified, initial encounter: Secondary | ICD-10-CM

## 2023-04-09 DIAGNOSIS — Z5900 Homelessness unspecified: Secondary | ICD-10-CM | POA: Diagnosis not present

## 2023-04-09 DIAGNOSIS — X31XXXA Exposure to excessive natural cold, initial encounter: Secondary | ICD-10-CM | POA: Diagnosis not present

## 2023-04-09 LAB — RAPID URINE DRUG SCREEN, HOSP PERFORMED
Amphetamines: NOT DETECTED
Barbiturates: NOT DETECTED
Benzodiazepines: NOT DETECTED
Cocaine: NOT DETECTED
Opiates: NOT DETECTED
Tetrahydrocannabinol: NOT DETECTED

## 2023-04-09 LAB — URINALYSIS, ROUTINE W REFLEX MICROSCOPIC
Bacteria, UA: NONE SEEN
Bilirubin Urine: NEGATIVE
Glucose, UA: >=500 mg/dL — AB
Hgb urine dipstick: NEGATIVE
Ketones, ur: NEGATIVE mg/dL
Leukocytes,Ua: NEGATIVE
Nitrite: NEGATIVE
Protein, ur: NEGATIVE mg/dL
Specific Gravity, Urine: 1.032 — ABNORMAL HIGH (ref 1.005–1.030)
pH: 6 (ref 5.0–8.0)

## 2023-04-09 LAB — CBC WITH DIFFERENTIAL/PLATELET
Abs Immature Granulocytes: 0.03 10*3/uL (ref 0.00–0.07)
Basophils Absolute: 0 10*3/uL (ref 0.0–0.1)
Basophils Relative: 1 %
Eosinophils Absolute: 0.2 10*3/uL (ref 0.0–0.5)
Eosinophils Relative: 2 %
HCT: 37.7 % — ABNORMAL LOW (ref 39.0–52.0)
Hemoglobin: 12.7 g/dL — ABNORMAL LOW (ref 13.0–17.0)
Immature Granulocytes: 0 %
Lymphocytes Relative: 39 %
Lymphs Abs: 3.4 10*3/uL (ref 0.7–4.0)
MCH: 30.7 pg (ref 26.0–34.0)
MCHC: 33.7 g/dL (ref 30.0–36.0)
MCV: 91.1 fL (ref 80.0–100.0)
Monocytes Absolute: 0.7 10*3/uL (ref 0.1–1.0)
Monocytes Relative: 8 %
Neutro Abs: 4.4 10*3/uL (ref 1.7–7.7)
Neutrophils Relative %: 50 %
Platelets: 283 10*3/uL (ref 150–400)
RBC: 4.14 MIL/uL — ABNORMAL LOW (ref 4.22–5.81)
RDW: 12.5 % (ref 11.5–15.5)
WBC: 8.7 10*3/uL (ref 4.0–10.5)
nRBC: 0 % (ref 0.0–0.2)

## 2023-04-09 LAB — COMPREHENSIVE METABOLIC PANEL
ALT: 17 U/L (ref 0–44)
AST: 16 U/L (ref 15–41)
Albumin: 3.4 g/dL — ABNORMAL LOW (ref 3.5–5.0)
Alkaline Phosphatase: 64 U/L (ref 38–126)
Anion gap: 7 (ref 5–15)
BUN: 11 mg/dL (ref 6–20)
CO2: 25 mmol/L (ref 22–32)
Calcium: 8.6 mg/dL — ABNORMAL LOW (ref 8.9–10.3)
Chloride: 102 mmol/L (ref 98–111)
Creatinine, Ser: 0.87 mg/dL (ref 0.61–1.24)
GFR, Estimated: >60 mL/min (ref >60)
Glucose, Bld: 332 mg/dL — ABNORMAL HIGH (ref 70–99)
Potassium: 3.9 mmol/L (ref 3.5–5.1)
Sodium: 134 mmol/L — ABNORMAL LOW (ref 135–145)
Total Bilirubin: 0.3 mg/dL (ref <1.2)
Total Protein: 6 g/dL — ABNORMAL LOW (ref 6.5–8.1)

## 2023-04-09 LAB — LIPASE, BLOOD: Lipase: 65 U/L — ABNORMAL HIGH (ref 11–51)

## 2023-04-09 LAB — ETHANOL: Alcohol, Ethyl (B): <10 mg/dL (ref <10)

## 2023-04-09 LAB — CBG MONITORING, ED: Glucose-Capillary: 308 mg/dL — ABNORMAL HIGH (ref 70–99)

## 2023-04-09 MED ORDER — ONDANSETRON 4 MG PO TBDP
4.0000 mg | ORAL_TABLET | Freq: Once | ORAL | Status: AC
  Administered 2023-04-09: 4 mg via ORAL
  Filled 2023-04-09: qty 1

## 2023-04-09 MED ORDER — IBUPROFEN 800 MG PO TABS
ORAL_TABLET | ORAL | Status: AC
  Filled 2023-04-09: qty 1

## 2023-04-09 MED ORDER — ALUM & MAG HYDROXIDE-SIMETH 200-200-20 MG/5ML PO SUSP
30.0000 mL | Freq: Once | ORAL | Status: AC
  Administered 2023-04-09: 30 mL via ORAL
  Filled 2023-04-09: qty 30

## 2023-04-09 MED ORDER — METFORMIN HCL 500 MG PO TABS
500.0000 mg | ORAL_TABLET | Freq: Once | ORAL | Status: AC
  Administered 2023-04-09: 500 mg via ORAL
  Filled 2023-04-09: qty 1

## 2023-04-09 MED ORDER — ONDANSETRON HCL 4 MG PO TABS: 4.0000 mg | ORAL_TABLET | Freq: Four times a day (QID) | ORAL | 0 refills | Status: DC | PRN

## 2023-04-09 NOTE — ED Notes (Signed)
Upon d/c Pt became very nasty and rude towards this RN. When Pt was told that he had to get up to leave he states "You are not the [employer]". Pt refused to pick his sweater and hat up off the floor and take his discharge paperwork with him. Pt told this nurse "Fuck You" when asked if he wanted it and walked out.

## 2023-04-09 NOTE — ED Triage Notes (Signed)
Pt states that he wants to be seen for hypothermia and "skin disorder". Pt denies pain. Pt is homeless and reports he needs to get warm and dry.

## 2023-04-09 NOTE — Discharge Instructions (Addendum)
Please make sure you take your medications as prescribed. Follow up with your PCP. If you have any concerns, new or worsening symptoms, please return to the ER.   Contact a doctor if: Your symptoms get worse. You have new symptoms. You have a fever. You cannot drink fluids without vomiting. You feel like you may vomit for more than 2 days. You feel light-headed or dizzy. You have a headache. You have muscle cramps. You have a rash. You have pain while peeing. Get help right away if: You have pain in your chest, neck, arm, or jaw. You feel very weak or you faint. You vomit again and again. You have vomit that is bright red or looks like black coffee grounds. You have bloody or black poop (stools) or poop that looks like tar. You have a very bad headache, a stiff neck, or both. You have very bad pain, cramping, or bloating in your belly (abdomen). You have trouble breathing. You are breathing very quickly. Your heart is beating very quickly. Your skin feels cold and clammy. You feel confused. You have signs of losing too much water in your body, such as: Dark pee, very little pee, or no pee. Cracked lips. Dry mouth. Sunken eyes. Sleepiness. Weakness. These symptoms may be an emergency. Get help right away. Call 911. Do not wait to see if the symptoms will go away. Do not drive yourself to the hospital.

## 2023-04-09 NOTE — ED Provider Notes (Addendum)
[place] EMERGENCY DEPARTMENT AT [facility] Provider Note   CSN: [id number] Arrival date & time: 04/08/23  2225     History Chief Complaint  Patient presents with   Nausea    Alejandro Baker is a 47 y.o. male with h/o DM, schizoaffective disorder, HTN, presents to the ER for evaluation of nausea and vomiting.  Patient is unable to quantify how much vomiting he has been having.  Reports his symptoms started yesterday.  Denies any abdominal pain.  Denies any chills.  He denies any urinary complaints.  Denies any black or bloody stool.  Reports he is able to pass gas.  No medications taken prior to arrival.  He was recently seen less than 24 hours ago and reports that symptoms started before then to.  HPI     Home Medications Prior to Admission medications   Medication Sig Start Date End Date Taking? Authorizing Provider  albuterol (VENTOLIN HFA) 108 (90 Base) MCG/ACT inhaler Inhale 1-2 puffs into the lungs every 6 (six) hours as needed for wheezing or shortness of breath. 05/19/21   Prosperi, Christian H, PA-C  benzonatate (TESSALON) 100 MG capsule Take 1 capsule (100 mg total) by mouth every 8 (eight) hours. 02/18/23   Smoot, Shawn Route, PA-C  doxycycline (VIBRAMYCIN) 100 MG capsule Take 1 capsule (100 mg total) by mouth 2 (two) times daily. 02/07/23   Tilden Fossa, MD  fluticasone [facility]) 50 MCG/ACT nasal spray Place 1 spray into both nostrils daily. 05/19/21   Prosperi, Christian H, PA-C  metFORMIN (GLUCOPHAGE) 500 MG tablet Take 1 tablet (500 mg total) by mouth 2 (two) times daily with a meal. 02/07/23   Tilden Fossa, MD  naproxen (NAPROSYN) 500 MG tablet Take 1 tablet (500 mg total) by mouth 2 (two) times daily with a meal. 03/10/23   Rancour, Jeannett Senior, MD  risperiDONE (RISPERDAL) 2 MG tablet Take 1 tablet (2 mg total) by mouth at bedtime. 04/10/22 10/07/22  Alicia Amel, MD      Allergies    Penicillins and Povidone-iodine    Review of Systems   Review of  Systems  Constitutional:  Negative for fever.  Gastrointestinal:  Positive for nausea and vomiting. Negative for abdominal pain.  Genitourinary:  Negative for dysuria and hematuria.    Physical Exam Updated Vital Signs BP (!) 143/89 (BP Location: Right Arm)   Pulse 92   Temp 98.9 F (37.2 C) (Oral)   Resp 18   SpO2 94%  Physical Exam Vitals and nursing note reviewed.  Constitutional:      General: He is not in acute distress.    Comments: Patient will not allow me to do a physical examination, so exam is limited due to cooperation.  Appearance is disheveled, unkempt.  Eyes:     General: No scleral icterus. Pulmonary:     Effort: Pulmonary effort is normal. No respiratory distress.  Skin:    General: Skin is dry.  Neurological:     Comments: Moving all extremities     ED Results / Procedures / Treatments   Labs (all labs ordered are listed, but only abnormal results are displayed) Labs Reviewed  LIPASE, BLOOD - Abnormal; Notable for the following components:      Result Value   Lipase 65 (*)    All other components within normal limits  COMPREHENSIVE METABOLIC PANEL - Abnormal; Notable for the following components:   Sodium 134 (*)    Glucose, Bld 332 (*)    Calcium 8.6 (*)  Total Protein 6.0 (*)    Albumin 3.4 (*)    All other components within normal limits  CBC WITH DIFFERENTIAL/PLATELET - Abnormal; Notable for the following components:   RBC 4.14 (*)    Hemoglobin 12.7 (*)    HCT 37.7 (*)    All other components within normal limits  URINALYSIS, ROUTINE W REFLEX MICROSCOPIC  RAPID URINE DRUG SCREEN, HOSP PERFORMED    EKG None  Radiology No results found.  Procedures Procedures   Medications Ordered in ED Medications  alum & mag hydroxide-simeth (MAALOX/MYLANTA) 200-200-20 MG/5ML suspension 30 mL (30 mLs Oral Given 04/09/23 0041)  ondansetron (ZOFRAN-ODT) disintegrating tablet 4 mg (4 mg Oral Given 04/09/23 0041)    ED Course/ Medical Decision  Making/ A&P Clinical Course as of 04/09/23 0126  Tue Apr 09, 2023  0005 The patient was lying stretched out on the stretcher. When asking him questions he was mumbling. Touch and shook his arm and elicited some reponse saying "I'm sleepy". Sternally rubbed the patient and he swung his arm. I asked the patient to please sit up to converse what brought him into the ER today. He refused and then started mumbling and wouldn't awaken with shaking his arm again. Unsure if drugs or alcohol is onboard, the patient has equal chest rise. Sternal rub and the patient attempted to swing his arms at me again, but is now sitting up. He reports that he has been vomiting since yesterday, but is unable to quantify. He denies any changes to his bowels. Unknown if he is having any fevers. Triage note mentions drug use. He does have a sulfur belch present during interview. He is refusing to let me do a physical examination. He reports that we can lab work but he doesn't want to be touched because "he has been vomiting". Discussed the importance of a physical exam and why it would be important to determine if he needs a CT scan or not, but he declines.  [RR]  0123 Evaluated the patient at bedside. Resting. Reports discomfort has improved.  [RR]    Clinical Course User Index [RR] Achille Rich, PA-C                               Medical Decision Making Amount and/or Complexity of Data Reviewed Labs: ordered.  Risk OTC drugs. Prescription drug management.    47 y.o. male presents to the ER for evaluation of nausea and vomiting. Differential diagnosis includes but is not limited to ACS/MI, Boerhaave's, DKA, elevated ICP, Ischemic bowel, Sepsis, Drug-related (toxicity, THC hyperemesis, ETOH, withdrawal), Appendicitis, Bowel obstruction, Electrolyte abnormalities, Pancreatitis, Biliary colic, Gastroenteritis, Gastroparesis, Hepatitis, Migraine, Thyroid disease, Renal colic, GERD/PUD, UTI. Vital signs unremarkable. Physical  exam as noted above.   Patient is seen multiple times in the past few days.  He has been seen before for nausea and vomiting.  He does not appear acutely in distress.  I independently reviewed and interpreted the patient's labs.  CBC shows mild decrease in hemoglobin 12.7, appears to be around baseline.  No leukocytosis.  Lipase is mildly elevated at 65.  CMP shows sodium 134.  Glucose at 332.  Mildly decreased calcium, total protein, and albumin.  No other electrolyte or LFT abnormality.  Normal BiCap with normal anion gap.  Ethanol undetectable.  Urinalysis is straw but concentrated urine with greater than 500 glucose present.  No ketones.  UDS unremarkable.  The patient refuses to allow me  to do a physical examination but reports will also do lab work and medications.  Discussed with the patient the importance of a physical examination to see if he needs a CT image or not.  Patient still refuses. He does not look in any acute distress. He does not appear acutely psychotic.   After evaluation, the patient reports that he was feeling better.  He has been sleeping on the stretcher during this time.  He has not shown any evidence of nausea or vomiting during his entire stay here.  I will discharge him home with a few doses of Zofran.  He was given a dose of his metformin as well given that he has not been taking it.  Labs are excess and with any DKA.  Except for mildly elevated lipase.  He is not complaining of any abdominal pain nor does he look in any acute distress.  Discharge home with return precautions given.   Portions of this report may have been transcribed using voice recognition software. Every effort was made to ensure accuracy; however, inadvertent computerized transcription errors may be present.   I discussed this case with my attending physician who cosigned this note including patient's presenting symptoms, physical exam, and planned diagnostics and interventions. Attending physician  stated agreement with plan or made changes to plan which were implemented.   Final Clinical Impression(s) / ED Diagnoses Final diagnoses:  Hyperglycemia  Nausea and vomiting, unspecified vomiting type  Homelessness    Rx / DC Orders ED Discharge Orders          Ordered    ondansetron (ZOFRAN) 4 MG tablet  Every 6 hours PRN        04/09/23 0530              Achille Rich, PA-C 04/09/23 0545    Achille Rich, PA-C 04/09/23 0547    Dione Booze, MD 04/09/23 [phone]
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
## 2023-04-10 NOTE — ED Notes (Signed)
Pt was informed that he was being discharged. Pt jumped out of bed like he was going to charge this Clinical research associate. Pt did not acknowledge discharge instructions and started walking towards TCU doors. Tried to redirect pt. Pt started swinging arms in the air. Security was called and pt was redirected to the exit.

## 2023-04-11 ENCOUNTER — Emergency Department (HOSPITAL_COMMUNITY)
Admission: EM | Admit: 2023-04-11 | Discharge: 2023-04-11 | Disposition: A | Discharge status: Home / Self Care | Payer: PPO | Attending: Emergency Medicine | Admitting: Emergency Medicine

## 2023-04-11 ENCOUNTER — Other Ambulatory Visit: Payer: Self-pay

## 2023-04-11 ENCOUNTER — Encounter (HOSPITAL_COMMUNITY): Payer: Self-pay | Admitting: Emergency Medicine

## 2023-04-11 DIAGNOSIS — R6889 Other general symptoms and signs: Secondary | ICD-10-CM

## 2023-04-11 DIAGNOSIS — Z5900 Homelessness unspecified: Secondary | ICD-10-CM | POA: Insufficient documentation

## 2023-04-11 DIAGNOSIS — S0990XA Unspecified injury of head, initial encounter: Secondary | ICD-10-CM | POA: Diagnosis not present

## 2023-04-11 DIAGNOSIS — R208 Other disturbances of skin sensation: Secondary | ICD-10-CM | POA: Insufficient documentation

## 2023-04-11 DIAGNOSIS — Z765 Malingerer [conscious simulation]: Secondary | ICD-10-CM | POA: Diagnosis not present

## 2023-04-11 DIAGNOSIS — F17210 Nicotine dependence, cigarettes, uncomplicated: Secondary | ICD-10-CM | POA: Diagnosis not present

## 2023-04-11 DIAGNOSIS — G8911 Acute pain due to trauma: Secondary | ICD-10-CM | POA: Diagnosis not present

## 2023-04-11 DIAGNOSIS — E119 Type 2 diabetes mellitus without complications: Secondary | ICD-10-CM | POA: Insufficient documentation

## 2023-04-11 DIAGNOSIS — T699XXA Effect of reduced temperature, unspecified, initial encounter: Secondary | ICD-10-CM | POA: Diagnosis not present

## 2023-04-11 DIAGNOSIS — I10 Essential (primary) hypertension: Secondary | ICD-10-CM | POA: Insufficient documentation

## 2023-04-11 DIAGNOSIS — J45909 Unspecified asthma, uncomplicated: Secondary | ICD-10-CM | POA: Diagnosis not present

## 2023-04-11 DIAGNOSIS — X31XXXA Exposure to excessive natural cold, initial encounter: Secondary | ICD-10-CM | POA: Insufficient documentation

## 2023-04-11 DIAGNOSIS — Z7984 Long term (current) use of oral hypoglycemic drugs: Secondary | ICD-10-CM | POA: Insufficient documentation

## 2023-04-11 NOTE — ED Triage Notes (Signed)
Pt here for frostbite, states he feels like his skin and "freezing and thawing out" and reports tingling. Redness noted to chest and finger tips. Pt air punching and ambulatory in triage.
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
## 2023-04-11 NOTE — Discharge Instructions (Signed)
Your skin exam was reassuring. Please try to wear warm clothes if you are going to be outside during colder weather. If you develop any life threatening symptoms.

## 2023-04-11 NOTE — Discharge Instructions (Signed)
Please return if you feel worse or are willing to have CT scans performed. We didn't see any signs of dangerous injuries.
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
## 2023-04-12 ENCOUNTER — Emergency Department (HOSPITAL_COMMUNITY)
Admission: EM | Admit: 2023-04-12 | Discharge: 2023-04-12 | Disposition: A | Discharge status: Home / Self Care | Payer: PPO | Attending: Emergency Medicine | Admitting: Emergency Medicine

## 2023-04-12 DIAGNOSIS — Z5900 Homelessness unspecified: Secondary | ICD-10-CM | POA: Insufficient documentation

## 2023-04-12 DIAGNOSIS — E1165 Type 2 diabetes mellitus with hyperglycemia: Secondary | ICD-10-CM | POA: Diagnosis not present

## 2023-04-12 DIAGNOSIS — T730XXA Starvation, initial encounter: Secondary | ICD-10-CM | POA: Insufficient documentation

## 2023-04-12 DIAGNOSIS — R739 Hyperglycemia, unspecified: Secondary | ICD-10-CM | POA: Diagnosis not present

## 2023-04-12 NOTE — ED Triage Notes (Signed)
Patient reports feeling cold " frost" this evening , homeless , respirations unlabored , ambulatory , alert and oriented . Warm blankets given to patient.

## 2023-04-12 NOTE — ED Notes (Addendum)
Denies any medical complaint.   Discharge instructions reviewed.   Opportunity for questions and concerns provided.   Alert, oriented and ambulatory.  Displays no signs of distress.

## 2023-04-12 NOTE — ED Provider Notes (Signed)
[place] EMERGENCY DEPARTMENT AT [facility] Provider Note   CSN: [id number] Arrival date & time: 04/12/23  2440     History Chief Complaint  Patient presents with   Cold Exposure    Alejandro Baker is a 47 y.o. male with past medical history significant for schizoaffective disorder, polysubstance abuse, malingering, homelessness presents to the emergency department requesting a place to lay down.  Patient was recently just discharged a few hours prior.  He reports that he did not leave.  He reports "I was brought back so that I was able to lay down into a bed."  When asked if he had any complaints, he responded no. Requesting food.   HPI     Home Medications Prior to Admission medications   Medication Sig Start Date End Date Taking? Authorizing Provider  albuterol (VENTOLIN HFA) 108 (90 Base) MCG/ACT inhaler Inhale 1-2 puffs into the lungs every 6 (six) hours as needed for wheezing or shortness of breath. 05/19/21   Prosperi, Christian H, PA-C  benzonatate (TESSALON) 100 MG capsule Take 1 capsule (100 mg total) by mouth every 8 (eight) hours. 02/18/23   Smoot, Shawn Route, PA-C  doxycycline (VIBRAMYCIN) 100 MG capsule Take 1 capsule (100 mg total) by mouth 2 (two) times daily. 02/07/23   Tilden Fossa, MD  fluticasone [facility]) 50 MCG/ACT nasal spray Place 1 spray into both nostrils daily. 05/19/21   Prosperi, Christian H, PA-C  metFORMIN (GLUCOPHAGE) 500 MG tablet Take 1 tablet (500 mg total) by mouth 2 (two) times daily with a meal. 02/07/23   Tilden Fossa, MD  naproxen (NAPROSYN) 500 MG tablet Take 1 tablet (500 mg total) by mouth 2 (two) times daily with a meal. 03/10/23   Rancour, Jeannett Senior, MD  ondansetron (ZOFRAN) 4 MG tablet Take 1 tablet (4 mg total) by mouth every 6 (six) hours as needed for nausea or vomiting. 04/09/23   Achille Rich, PA-C  risperiDONE (RISPERDAL) 2 MG tablet Take 1 tablet (2 mg total) by mouth at bedtime. 04/10/22 10/07/22  Alicia Amel, MD       Allergies    Penicillins and Povidone-iodine    Review of Systems   Review of Systems  Constitutional:  Negative for chills and fever.  Respiratory:  Negative for shortness of breath.   Cardiovascular:  Negative for chest pain.    Physical Exam Updated Vital Signs BP (!) 142/91 (BP Location: Right Arm)   Pulse (!) 101   Temp 98.1 F (36.7 C)   Resp 18   SpO2 100%  Physical Exam Vitals and nursing note reviewed.  Constitutional:      Comments: Sleeping.  Keeping eyes closed but answering questions.  Does not appear in any acute distress.  Disheveled.  HENT:     Mouth/Throat:     Mouth: Mucous membranes are moist.  Eyes:     General: No scleral icterus. Pulmonary:     Effort: Pulmonary effort is normal. No respiratory distress.  Skin:    General: Skin is warm and dry.  Neurological:     General: No focal deficit present.     ED Results / Procedures / Treatments   Labs (all labs ordered are listed, but only abnormal results are displayed) Labs Reviewed - No data to display  EKG None  Radiology No results found.  Procedures Procedures   Medications Ordered in ED Medications - No data to display  ED Course/ Medical Decision Making/ A&P   Medical Decision Making  47 y.o.  male presents to the ER today for evaluation of wanting to lay down and requesting food. Differential diagnosis includes but is not limited to homelessness, hunger. Vital signs show mildly hypertension, otherwise unremarkable. Physical exam as noted above.   He has been seen multiple times in the past few days, even multiple times in the past 24 hours.   Patient was just seen a few hours prior. He did not leave the department. He is requesting food. He reports that he just wants to lay down and have food. Food will be provided. Likely here for secondary gain for homelessness. He is stable for discharge.   Portions of this report may have been transcribed using voice recognition software.  Every effort was made to ensure accuracy; however, inadvertent computerized transcription errors may be present.   Final Clinical Impression(s) / ED Diagnoses Final diagnoses:  Homelessness  Hungry, initial encounter    Rx / DC Orders ED Discharge Orders     None         Achille Rich, PA-C 04/12/23 0542    Glynn Octave, MD 04/13/23 [phone]

## 2023-04-12 NOTE — ED Notes (Signed)
Pt refused to be discharged. Escorted out by Furniture conservator/restorer.   Declined discharge vitals.

## 2023-04-13 ENCOUNTER — Emergency Department (HOSPITAL_COMMUNITY)
Admission: EM | Admit: 2023-04-13 | Discharge: 2023-04-13 | Discharge status: Against Medical Advice | Payer: PPO | Attending: Emergency Medicine | Admitting: Emergency Medicine

## 2023-04-13 ENCOUNTER — Other Ambulatory Visit: Payer: Self-pay

## 2023-04-13 ENCOUNTER — Emergency Department (HOSPITAL_COMMUNITY)
Admission: EM | Admit: 2023-04-13 | Discharge: 2023-04-13 | Discharge status: Against Medical Advice | Payer: PPO | Source: Home / Self Care | Attending: Emergency Medicine | Admitting: Emergency Medicine

## 2023-04-13 ENCOUNTER — Encounter (HOSPITAL_COMMUNITY): Payer: Self-pay

## 2023-04-13 DIAGNOSIS — M79604 Pain in right leg: Secondary | ICD-10-CM | POA: Insufficient documentation

## 2023-04-13 DIAGNOSIS — M79605 Pain in left leg: Secondary | ICD-10-CM | POA: Insufficient documentation

## 2023-04-13 DIAGNOSIS — M79602 Pain in left arm: Secondary | ICD-10-CM | POA: Diagnosis not present

## 2023-04-13 DIAGNOSIS — Z5321 Procedure and treatment not carried out due to patient leaving prior to being seen by health care provider: Secondary | ICD-10-CM | POA: Insufficient documentation

## 2023-04-13 DIAGNOSIS — R739 Hyperglycemia, unspecified: Secondary | ICD-10-CM | POA: Insufficient documentation

## 2023-04-13 DIAGNOSIS — Z5900 Homelessness unspecified: Secondary | ICD-10-CM

## 2023-04-13 LAB — URINALYSIS, ROUTINE W REFLEX MICROSCOPIC
Bacteria, UA: NONE SEEN
Bilirubin Urine: NEGATIVE
Glucose, UA: >=500 mg/dL — AB
Hgb urine dipstick: NEGATIVE
Ketones, ur: NEGATIVE mg/dL
Leukocytes,Ua: NEGATIVE
Nitrite: NEGATIVE
Protein, ur: NEGATIVE mg/dL
Specific Gravity, Urine: 1.029 (ref 1.005–1.030)
pH: 6 (ref 5.0–8.0)

## 2023-04-13 LAB — CBG MONITORING, ED: Glucose-Capillary: 454 mg/dL — ABNORMAL HIGH (ref 70–99)

## 2023-04-13 NOTE — ED Provider Notes (Signed)
[place] EMERGENCY DEPARTMENT AT [facility] Provider Note   CSN: [id number] Arrival date & time: 04/13/23  0745     History  Chief Complaint  Patient presents with   Homeless    Alejandro Baker is a 47 y.o. male.  HPI   47 year old homeless man who is well-known to this department presents emergency department with vague complaints.  He is currently requesting a place to stay and something to eat and drink to hang out "for a while".  He has no acute complaints and is sleeping in the stretcher and not forthcoming with information.  Home Medications Prior to Admission medications   Medication Sig Start Date End Date Taking? Authorizing Provider  albuterol (VENTOLIN HFA) 108 (90 Base) MCG/ACT inhaler Inhale 1-2 puffs into the lungs every 6 (six) hours as needed for wheezing or shortness of breath. 05/19/21   Prosperi, Christian H, PA-C  benzonatate (TESSALON) 100 MG capsule Take 1 capsule (100 mg total) by mouth every 8 (eight) hours. 02/18/23   Smoot, Shawn Route, PA-C  doxycycline (VIBRAMYCIN) 100 MG capsule Take 1 capsule (100 mg total) by mouth 2 (two) times daily. 02/07/23   Tilden Fossa, MD  fluticasone [facility]) 50 MCG/ACT nasal spray Place 1 spray into both nostrils daily. 05/19/21   Prosperi, Christian H, PA-C  metFORMIN (GLUCOPHAGE) 500 MG tablet Take 1 tablet (500 mg total) by mouth 2 (two) times daily with a meal. 02/07/23   Tilden Fossa, MD  naproxen (NAPROSYN) 500 MG tablet Take 1 tablet (500 mg total) by mouth 2 (two) times daily with a meal. 03/10/23   Rancour, Jeannett Senior, MD  ondansetron (ZOFRAN) 4 MG tablet Take 1 tablet (4 mg total) by mouth every 6 (six) hours as needed for nausea or vomiting. 04/09/23   Achille Rich, PA-C  risperiDONE (RISPERDAL) 2 MG tablet Take 1 tablet (2 mg total) by mouth at bedtime. 04/10/22 10/07/22  Alicia Amel, MD      Allergies    Penicillins and Povidone-iodine    Review of Systems   Review of Systems  Unable to perform  ROS: Other (Patient not fully cooperative)    Physical Exam Updated Vital Signs BP (!) 130/90   Pulse 93   Temp 97.6 F (36.4 C) (Oral)   Resp 18   Ht 5\' 11"  (1.803 m)   Wt 90.3 kg   SpO2 98%   BMI 27.75 kg/m  Physical Exam Constitutional:      General: He is not in acute distress. Cardiovascular:     Rate and Rhythm: Normal rate.  Pulmonary:     Effort: Pulmonary effort is normal.  Abdominal:     Palpations: Abdomen is soft.  Musculoskeletal:        General: No deformity.  Neurological:     Mental Status: He is oriented to person, place, and time.     ED Results / Procedures / Treatments   Labs (all labs ordered are listed, but only abnormal results are displayed) Labs Reviewed - No data to display  EKG None  Radiology No results found.  Procedures Procedures    Medications Ordered in ED Medications - No data to display  ED Course/ Medical Decision Making/ A&P                                 Medical Decision Making  47 year old homeless man known to this department presents with vague complaints requesting a  place to sit, and eat and drink.  He is not forthcoming on exam or fully cooperative.  Vitals are stable.  From a limited physical exam he appears in no distress with no acute complaints.  Patient has been allowed to eat and drink and was sleeping on the stretcher.  At time of reevaluation the patient has eloped.  He reportedly walked out of the department unassisted.        Final Clinical Impression(s) / ED Diagnoses Final diagnoses:  None    Rx / DC Orders ED Discharge Orders     None         Rozelle Logan, DO 04/13/23 1611

## 2023-04-13 NOTE — ED Notes (Signed)
Called patient name 4 times no answer.

## 2023-04-13 NOTE — ED Triage Notes (Signed)
Patient is here every day and was here in the early hours of this am.  Patient is homeless and has various complaints.  The complaint for this check in, is he is out of his risperdal which he has been out of "for awhile".  Patient is dishelved and smells of feces. Patient requesting hot blankets and food.

## 2023-04-13 NOTE — ED Notes (Signed)
Pt eloped.

## 2023-04-13 NOTE — ED Provider Triage Note (Signed)
Emergency Medicine Provider Triage Evaluation Note  Alejandro Baker , a 47 y.o. male  was evaluated in triage.  Pt complains of pain.  Complaining of pain to both of his feet and his left arm for "quite a while".  Requesting for pain medication.  Was noted to have elevated blood sugar but denies having history of diabetes.  Patient well-known to the ED last seen yesterday for various complaint.  Requesting for food.  Review of Systems  Positive: As above Negative: As above  Physical Exam  BP 121/85 (BP Location: Right Arm)   Pulse (!) 103   Temp 98.6 F (37 C) (Oral)   Resp 19   SpO2 98%  Gen:   Awake, no distress   Resp:  Normal effort  MSK:   Moves extremities without difficulty  Other:    Medical Decision Making  Medically screening exam initiated at 12:31 AM.  Appropriate orders placed.  Alejandro Baker was informed that the remainder of the evaluation will be completed by another provider, this initial triage assessment does not replace that evaluation, and the importance of remaining in the ED until their evaluation is complete.     Fayrene Helper, PA-C 04/13/23 [phone]
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
## 2023-04-13 NOTE — ED Notes (Signed)
Pt refused to let this RN collect blood for labs x 3

## 2023-04-20 ENCOUNTER — Encounter (HOSPITAL_COMMUNITY): Payer: Self-pay

## 2023-04-20 ENCOUNTER — Other Ambulatory Visit: Payer: Self-pay

## 2023-04-20 ENCOUNTER — Emergency Department (HOSPITAL_COMMUNITY)
Admission: EM | Admit: 2023-04-20 | Discharge: 2023-04-20 | Discharge status: Against Medical Advice | Payer: PPO | Attending: Emergency Medicine | Admitting: Emergency Medicine

## 2023-04-20 DIAGNOSIS — R0981 Nasal congestion: Secondary | ICD-10-CM | POA: Diagnosis not present

## 2023-04-20 DIAGNOSIS — E161 Other hypoglycemia: Secondary | ICD-10-CM | POA: Diagnosis not present

## 2023-04-20 DIAGNOSIS — Z5321 Procedure and treatment not carried out due to patient leaving prior to being seen by health care provider: Secondary | ICD-10-CM | POA: Insufficient documentation

## 2023-04-20 DIAGNOSIS — Z760 Encounter for issue of repeat prescription: Secondary | ICD-10-CM | POA: Insufficient documentation

## 2023-04-20 DIAGNOSIS — R609 Edema, unspecified: Secondary | ICD-10-CM | POA: Diagnosis not present

## 2023-04-20 DIAGNOSIS — R739 Hyperglycemia, unspecified: Secondary | ICD-10-CM | POA: Diagnosis not present

## 2023-04-20 DIAGNOSIS — E119 Type 2 diabetes mellitus without complications: Secondary | ICD-10-CM | POA: Insufficient documentation

## 2023-04-20 DIAGNOSIS — E162 Hypoglycemia, unspecified: Secondary | ICD-10-CM | POA: Diagnosis not present

## 2023-04-20 DIAGNOSIS — M79606 Pain in leg, unspecified: Secondary | ICD-10-CM | POA: Diagnosis not present

## 2023-04-20 DIAGNOSIS — R059 Cough, unspecified: Secondary | ICD-10-CM | POA: Diagnosis not present

## 2023-04-20 LAB — CBG MONITORING, ED: Glucose-Capillary: 301 mg/dL — ABNORMAL HIGH (ref 70–99)

## 2023-04-20 NOTE — ED Notes (Signed)
Pt refused blood work  

## 2023-04-20 NOTE — ED Triage Notes (Signed)
Patient arrived by [facility] after having bystanders call for ems. States that he needs diabetic meds. Patient is homeless, cold and hungry. Coming to ED now on a nearly daily basis

## 2023-04-20 NOTE — ED Notes (Signed)
Pt seen leaving lobby and walking away
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
## 2023-04-21 ENCOUNTER — Emergency Department (HOSPITAL_COMMUNITY): Payer: PPO

## 2023-04-21 ENCOUNTER — Emergency Department (HOSPITAL_COMMUNITY)
Admission: EM | Admit: 2023-04-21 | Discharge: 2023-04-22 | Disposition: A | Discharge status: Home / Self Care | Payer: PPO | Source: Home / Self Care | Attending: Emergency Medicine | Admitting: Emergency Medicine

## 2023-04-21 ENCOUNTER — Emergency Department (HOSPITAL_COMMUNITY)
Admission: EM | Admit: 2023-04-21 | Discharge: 2023-04-21 | Discharge status: Against Medical Advice | Payer: PPO | Attending: Emergency Medicine | Admitting: Emergency Medicine

## 2023-04-21 ENCOUNTER — Encounter (HOSPITAL_COMMUNITY): Payer: Self-pay | Admitting: Emergency Medicine

## 2023-04-21 ENCOUNTER — Other Ambulatory Visit: Payer: Self-pay

## 2023-04-21 DIAGNOSIS — L03114 Cellulitis of left upper limb: Secondary | ICD-10-CM | POA: Insufficient documentation

## 2023-04-21 DIAGNOSIS — Z5900 Homelessness unspecified: Secondary | ICD-10-CM | POA: Insufficient documentation

## 2023-04-21 DIAGNOSIS — F17210 Nicotine dependence, cigarettes, uncomplicated: Secondary | ICD-10-CM | POA: Insufficient documentation

## 2023-04-21 DIAGNOSIS — E1140 Type 2 diabetes mellitus with diabetic neuropathy, unspecified: Secondary | ICD-10-CM | POA: Diagnosis not present

## 2023-04-21 DIAGNOSIS — D72829 Elevated white blood cell count, unspecified: Secondary | ICD-10-CM | POA: Insufficient documentation

## 2023-04-21 DIAGNOSIS — M79642 Pain in left hand: Secondary | ICD-10-CM | POA: Diagnosis not present

## 2023-04-21 DIAGNOSIS — Z5321 Procedure and treatment not carried out due to patient leaving prior to being seen by health care provider: Secondary | ICD-10-CM | POA: Insufficient documentation

## 2023-04-21 DIAGNOSIS — J45909 Unspecified asthma, uncomplicated: Secondary | ICD-10-CM | POA: Diagnosis not present

## 2023-04-21 DIAGNOSIS — S61402A Unspecified open wound of left hand, initial encounter: Secondary | ICD-10-CM | POA: Diagnosis not present

## 2023-04-21 DIAGNOSIS — Z91148 Patient's other noncompliance with medication regimen for other reason: Secondary | ICD-10-CM | POA: Insufficient documentation

## 2023-04-21 DIAGNOSIS — Z7984 Long term (current) use of oral hypoglycemic drugs: Secondary | ICD-10-CM | POA: Insufficient documentation

## 2023-04-21 DIAGNOSIS — M79645 Pain in left finger(s): Secondary | ICD-10-CM | POA: Diagnosis not present

## 2023-04-21 DIAGNOSIS — R609 Edema, unspecified: Secondary | ICD-10-CM | POA: Diagnosis not present

## 2023-04-21 DIAGNOSIS — I10 Essential (primary) hypertension: Secondary | ICD-10-CM | POA: Diagnosis not present

## 2023-04-21 DIAGNOSIS — E119 Type 2 diabetes mellitus without complications: Secondary | ICD-10-CM | POA: Insufficient documentation

## 2023-04-21 DIAGNOSIS — Z7951 Long term (current) use of inhaled steroids: Secondary | ICD-10-CM | POA: Diagnosis not present

## 2023-04-21 NOTE — ED Triage Notes (Signed)
Pt BIB [facility] for left thumb pain.

## 2023-04-21 NOTE — ED Triage Notes (Signed)
Pt reports infection in left hand. Swelling to area.

## 2023-04-22 ENCOUNTER — Other Ambulatory Visit: Payer: Self-pay

## 2023-04-22 ENCOUNTER — Emergency Department (HOSPITAL_COMMUNITY)
Admission: EM | Admit: 2023-04-22 | Discharge: 2023-04-23 | Disposition: A | Discharge status: Home / Self Care | Payer: PPO | Attending: Student | Admitting: Student

## 2023-04-22 DIAGNOSIS — R531 Weakness: Secondary | ICD-10-CM | POA: Diagnosis not present

## 2023-04-22 DIAGNOSIS — L03114 Cellulitis of left upper limb: Secondary | ICD-10-CM | POA: Insufficient documentation

## 2023-04-22 DIAGNOSIS — J45909 Unspecified asthma, uncomplicated: Secondary | ICD-10-CM | POA: Insufficient documentation

## 2023-04-22 DIAGNOSIS — M79642 Pain in left hand: Secondary | ICD-10-CM | POA: Diagnosis not present

## 2023-04-22 DIAGNOSIS — I10 Essential (primary) hypertension: Secondary | ICD-10-CM | POA: Diagnosis not present

## 2023-04-22 DIAGNOSIS — E1140 Type 2 diabetes mellitus with diabetic neuropathy, unspecified: Secondary | ICD-10-CM | POA: Insufficient documentation

## 2023-04-22 DIAGNOSIS — Z7984 Long term (current) use of oral hypoglycemic drugs: Secondary | ICD-10-CM | POA: Diagnosis not present

## 2023-04-22 DIAGNOSIS — F17210 Nicotine dependence, cigarettes, uncomplicated: Secondary | ICD-10-CM | POA: Insufficient documentation

## 2023-04-22 DIAGNOSIS — Z7951 Long term (current) use of inhaled steroids: Secondary | ICD-10-CM | POA: Insufficient documentation

## 2023-04-22 DIAGNOSIS — L0390 Cellulitis, unspecified: Secondary | ICD-10-CM

## 2023-04-22 DIAGNOSIS — M79645 Pain in left finger(s): Secondary | ICD-10-CM | POA: Diagnosis not present

## 2023-04-22 LAB — COMPREHENSIVE METABOLIC PANEL
ALT: 19 U/L (ref 0–44)
AST: 16 U/L (ref 15–41)
Albumin: 3.5 g/dL (ref 3.5–5.0)
Alkaline Phosphatase: 71 U/L (ref 38–126)
Anion gap: 8 (ref 5–15)
BUN: 16 mg/dL (ref 6–20)
CO2: 24 mmol/L (ref 22–32)
Calcium: 8.6 mg/dL — ABNORMAL LOW (ref 8.9–10.3)
Chloride: 99 mmol/L (ref 98–111)
Creatinine, Ser: 0.58 mg/dL — ABNORMAL LOW (ref 0.61–1.24)
GFR, Estimated: >60 mL/min (ref >60)
Glucose, Bld: 346 mg/dL — ABNORMAL HIGH (ref 70–99)
Potassium: 3.5 mmol/L (ref 3.5–5.1)
Sodium: 131 mmol/L — ABNORMAL LOW (ref 135–145)
Total Bilirubin: 0.4 mg/dL (ref <1.2)
Total Protein: 6.5 g/dL (ref 6.5–8.1)

## 2023-04-22 LAB — CBC WITH DIFFERENTIAL/PLATELET
Abs Immature Granulocytes: 0.02 10*3/uL (ref 0.00–0.07)
Basophils Absolute: 0 10*3/uL (ref 0.0–0.1)
Basophils Relative: 0 %
Eosinophils Absolute: 0.1 10*3/uL (ref 0.0–0.5)
Eosinophils Relative: 1 %
HCT: 41.6 % (ref 39.0–52.0)
Hemoglobin: 13.8 g/dL (ref 13.0–17.0)
Immature Granulocytes: 0 %
Lymphocytes Relative: 23 %
Lymphs Abs: 2.5 10*3/uL (ref 0.7–4.0)
MCH: 30.2 pg (ref 26.0–34.0)
MCHC: 33.2 g/dL (ref 30.0–36.0)
MCV: 91 fL (ref 80.0–100.0)
Monocytes Absolute: 0.8 10*3/uL (ref 0.1–1.0)
Monocytes Relative: 8 %
Neutro Abs: 7.1 10*3/uL (ref 1.7–7.7)
Neutrophils Relative %: 68 %
Platelets: 277 10*3/uL (ref 150–400)
RBC: 4.57 MIL/uL (ref 4.22–5.81)
RDW: 12.7 % (ref 11.5–15.5)
WBC: 10.6 10*3/uL — ABNORMAL HIGH (ref 4.0–10.5)
nRBC: 0 % (ref 0.0–0.2)

## 2023-04-22 LAB — C-REACTIVE PROTEIN: CRP: 2.2 mg/dL — ABNORMAL HIGH (ref <1.0)

## 2023-04-22 LAB — SEDIMENTATION RATE: Sed Rate: 18 mm/h — ABNORMAL HIGH (ref 0–16)

## 2023-04-22 MED ORDER — METFORMIN HCL 500 MG PO TABS
500.0000 mg | ORAL_TABLET | Freq: Once | ORAL | Status: AC
Start: 2023-04-22 — End: 2023-04-22
  Administered 2023-04-22: 500 mg via ORAL
  Filled 2023-04-22: qty 1

## 2023-04-22 MED ORDER — DOXYCYCLINE HYCLATE 100 MG PO TABS
100.0000 mg | ORAL_TABLET | Freq: Once | ORAL | Status: AC
  Administered 2023-04-22: 100 mg via ORAL
  Filled 2023-04-22: qty 1

## 2023-04-22 MED ORDER — METFORMIN HCL 500 MG PO TABS: 500.0000 mg | ORAL_TABLET | Freq: Two times a day (BID) | ORAL | 3 refills | Status: DC

## 2023-04-22 MED ORDER — DOXYCYCLINE HYCLATE 100 MG PO CAPS
100.0000 mg | ORAL_CAPSULE | Freq: Two times a day (BID) | ORAL | 0 refills | Status: DC
Start: 2023-04-22 — End: 2023-04-29

## 2023-04-22 MED ORDER — METFORMIN HCL 500 MG PO TABS: 500.0000 mg | ORAL_TABLET | Freq: Two times a day (BID) | ORAL | 3 refills | Status: AC

## 2023-04-22 NOTE — Discharge Instructions (Addendum)
Return if the lesion in your hand seems to be getting worse.

## 2023-04-22 NOTE — ED Provider Notes (Signed)
[place] EMERGENCY DEPARTMENT AT [facility] Provider Note   CSN: [id number] Arrival date & time: 04/21/23  2241     History  Chief Complaint  Patient presents with   Hand Pain    Alejandro Baker is a 47 y.o. male.  The history is provided by the patient.  Hand Pain  He has history of diabetes, schizophrenia, polysubstance abuse, homelessness and comes in complaining of pain and swelling of his left hand which started about 2 days ago.  He has had subjective fever but denies chills or sweats.  He denies currently using any drugs although he does admit to smoking cigarettes.  He states he has not taken any of his medications for several months.   Home Medications Prior to Admission medications   Medication Sig Start Date End Date Taking? Authorizing Provider  albuterol (VENTOLIN HFA) 108 (90 Base) MCG/ACT inhaler Inhale 1-2 puffs into the lungs every 6 (six) hours as needed for wheezing or shortness of breath. 05/19/21   Prosperi, Christian H, PA-C  benzonatate (TESSALON) 100 MG capsule Take 1 capsule (100 mg total) by mouth every 8 (eight) hours. 02/18/23   Smoot, Shawn Route, PA-C  doxycycline (VIBRAMYCIN) 100 MG capsule Take 1 capsule (100 mg total) by mouth 2 (two) times daily. 02/07/23   Tilden Fossa, MD  fluticasone [facility]) 50 MCG/ACT nasal spray Place 1 spray into both nostrils daily. 05/19/21   Prosperi, Christian H, PA-C  metFORMIN (GLUCOPHAGE) 500 MG tablet Take 1 tablet (500 mg total) by mouth 2 (two) times daily with a meal. 02/07/23   Tilden Fossa, MD  naproxen (NAPROSYN) 500 MG tablet Take 1 tablet (500 mg total) by mouth 2 (two) times daily with a meal. 03/10/23   Rancour, Jeannett Senior, MD  ondansetron (ZOFRAN) 4 MG tablet Take 1 tablet (4 mg total) by mouth every 6 (six) hours as needed for nausea or vomiting. 04/09/23   Achille Rich, PA-C  risperiDONE (RISPERDAL) 2 MG tablet Take 1 tablet (2 mg total) by mouth at bedtime. 04/10/22 10/07/22  Alicia Amel, MD       Allergies    Penicillins and Povidone-iodine    Review of Systems   Review of Systems  All other systems reviewed and are negative.   Physical Exam Updated Vital Signs BP 129/87 (BP Location: Left Arm)   Pulse (!) 101   Resp 16   SpO2 97%  Physical Exam Vitals and nursing note reviewed.   47 year old male, resting comfortably and in no acute distress. Vital signs are significant for borderline elevated heart rate. Oxygen saturation is 97%, which is normal. Head is normocephalic and atraumatic. PERRLA, EOMI. Oropharynx is clear. Neck is nontender and supple without adenopathy. Lungs are clear without rales, wheezes, or rhonchi. Chest is nontender. Heart has regular rate and rhythm without murmur. Abdomen is soft, flat, nontender. Extremities: There is an area in the left hand overlying the first metacarpal which has erythema and swelling with a white center.  The white center is 1.5 cm in diameter and the entire erythematous area is 3 cm in diameter.  There are no lymphangitic streaks seen.  There is no fluctuance. Skin is warm and dry without other rash. Neurologic: Mental status is normal, moves all extremities equally.    ED Results / Procedures / Treatments   Labs (all labs ordered are listed, but only abnormal results are displayed) Labs Reviewed  CBC WITH DIFFERENTIAL/PLATELET - Abnormal; Notable for the following components:  Result Value   WBC 10.6 (*)    All other components within normal limits  COMPREHENSIVE METABOLIC PANEL - Abnormal; Notable for the following components:   Sodium 131 (*)    Glucose, Bld 346 (*)    Creatinine, Ser 0.58 (*)    Calcium 8.6 (*)    All other components within normal limits  SEDIMENTATION RATE - Abnormal; Notable for the following components:   Sed Rate 18 (*)    All other components within normal limits  C-REACTIVE PROTEIN   Radiology DG Hand Complete Left Result Date: 04/21/2023 CLINICAL DATA:  Injury. Infection  in the left hand with swelling to the entire hand. Wound in the first MCP joint. EXAM: LEFT HAND - COMPLETE 3+ VIEW COMPARISON:  None Available. FINDINGS: Prominent dorsal soft tissue swelling over the left hand. No radiopaque foreign body or soft tissue gas identified. Underlying bones appear intact. No evidence of acute fracture or dislocation. No focal bone lesion or bone destruction. Joint spaces are normal. IMPRESSION: Diffuse soft tissue swelling.  No acute bony abnormalities. Electronically Signed   By: Burman Nieves M.D.   On: 04/21/2023 23:33    Procedures .Ultrasound ED Soft Tissue  Date/Time: 04/22/2023 4:39 AM  Performed by: Dione Booze, MD Authorized by: Dione Booze, MD   Procedure details:    Indications: evaluate for cellulitis     Transverse view:  Visualized   Longitudinal view:  Visualized   Images: archived     Limitations:  Patient compliance Location:    Location: upper extremity     Side:  Left Findings:     no abscess present    cellulitis present     Medications Ordered in ED Medications  doxycycline (VIBRA-TABS) tablet 100 mg (has no administration in time range)  metFORMIN (GLUCOPHAGE) tablet 500 mg (has no administration in time range)    ED Course/ Medical Decision Making/ A&P                                 Medical Decision Making Amount and/or Complexity of Data Reviewed Labs: ordered. Radiology: ordered.   Area of swelling of the left hand concerning for cellulitis, possible abscess.  X-ray shows soft tissue swelling but no other abnormalities.  I have independently viewed the images, and agree with radiologist's interpretation.  I have ordered laboratory workup of CBC, comprehensive metabolic panel, sedimentation rate, CRP.  I have reviewed his past records, and note multiple ED visits for various complaints but mainly for homelessness.  He has had 38 ED visits in the last 6 months.  I have reviewed his laboratory test, my interpretation  is mild hyperglycemia, hyponatremia commensurate with degree of hyperglycemia, borderline leukocytosis which is nonspecific.  Sedimentation rate is only minimally elevated.  Soft tissue ultrasound performed at bedside shows no drainable fluid collection.  I have ordered a dose of doxycycline and a dose of metformin.  I am discharging him with prescriptions for metformin and doxycycline, requesting follow-up in 1 week.  Return if his lesion seems to be getting worse.  Final Clinical Impression(s) / ED Diagnoses Final diagnoses:  Cellulitis of left hand  Diabetes mellitus type 2 in nonobese (HCC)  Noncompliance with medication regimen  Homeless    Rx / DC Orders ED Discharge Orders          Ordered    doxycycline (VIBRAMYCIN) 100 MG capsule  2 times daily  04/22/23 0446    metFORMIN (GLUCOPHAGE) 500 MG tablet  2 times daily with meals        04/22/23 0446              Dione Booze, MD 04/22/23 [phone]

## 2023-04-22 NOTE — ED Triage Notes (Signed)
BIBA from the [facility] for pain all over. 190/80 BP 99% room air 100 HR

## 2023-04-23 DIAGNOSIS — L03114 Cellulitis of left upper limb: Secondary | ICD-10-CM | POA: Diagnosis not present

## 2023-04-23 MED ORDER — LIDOCAINE-EPINEPHRINE (PF) 2 %-1:200000 IJ SOLN
10.0000 mL | Freq: Once | INTRAMUSCULAR | Status: AC
  Administered 2023-04-23: 10 mL via INTRADERMAL
  Filled 2023-04-23: qty 20

## 2023-04-23 MED ORDER — MUPIROCIN CALCIUM 2 % EX CREA
TOPICAL_CREAM | Freq: Once | CUTANEOUS | Status: AC
  Filled 2023-04-23: qty 15

## 2023-04-23 MED ORDER — DOXYCYCLINE HYCLATE 100 MG PO TABS
100.0000 mg | ORAL_TABLET | Freq: Once | ORAL | Status: AC
  Administered 2023-04-23: 100 mg via ORAL
  Filled 2023-04-23: qty 1

## 2023-04-23 NOTE — ED Provider Notes (Signed)
[place] EMERGENCY DEPARTMENT AT [facility] Provider Note  CSN: [id number] Arrival date & time: 04/22/23 2249  Chief Complaint(s) Hand Pain  HPI Alejandro Baker is a 47 y.o. male with PMH homelessness, polysubstance abuse, frequent ER presentations with concern for secondary gain, asthma, T2DM who presents emergency department for evaluation of left hand pain.  He was seen in the emergency room yesterday and started on antibiotics for cellulitis with negative x-ray imaging.  Patient arrives to the emergency department clearly very intoxicated but with worsening swelling and erythema over the thenar eminence on the left.  Denies new trauma to the hand.  Denies chest pain, shortness of breath, abdominal pain, nausea, vomiting or other systemic symptoms.   Past Medical History Past Medical History:  Diagnosis Date   Asthma    DM (diabetes mellitus) (HCC)    Homeless    Hypertension    Neuropathy    Schizoaffective disorder, bipolar type [facility])    Patient Active Problem List   Diagnosis Date Noted   Malingering 11/14/2021   Homelessness 11/14/2021   Polysubstance abuse (HCC) 09/17/2020   Schizoaffective disorder, bipolar type (HCC)    Substance-induced disorder (HCC)    Disorganized schizophrenia (HCC)    Home Medication(s) Prior to Admission medications   Medication Sig Start Date End Date Taking? Authorizing Provider  albuterol (VENTOLIN HFA) 108 (90 Base) MCG/ACT inhaler Inhale 1-2 puffs into the lungs every 6 (six) hours as needed for wheezing or shortness of breath. 05/19/21   Prosperi, Christian H, PA-C  benzonatate (TESSALON) 100 MG capsule Take 1 capsule (100 mg total) by mouth every 8 (eight) hours. 02/18/23   Smoot, Shawn Route, PA-C  doxycycline (VIBRAMYCIN) 100 MG capsule Take 1 capsule (100 mg total) by mouth 2 (two) times daily. 04/22/23   Dione Booze, MD  fluticasone [facility]) 50 MCG/ACT nasal spray Place 1 spray into both nostrils daily. 05/19/21   Prosperi,  Christian H, PA-C  metFORMIN (GLUCOPHAGE) 500 MG tablet Take 1 tablet (500 mg total) by mouth 2 (two) times daily with a meal. 04/22/23   Dione Booze, MD  naproxen (NAPROSYN) 500 MG tablet Take 1 tablet (500 mg total) by mouth 2 (two) times daily with a meal. 03/10/23   Rancour, Jeannett Senior, MD  ondansetron (ZOFRAN) 4 MG tablet Take 1 tablet (4 mg total) by mouth every 6 (six) hours as needed for nausea or vomiting. 04/09/23   Achille Rich, PA-C  risperiDONE (RISPERDAL) 2 MG tablet Take 1 tablet (2 mg total) by mouth at bedtime. 04/10/22 10/07/22  Alicia Amel, MD                                                                                                                                    Past Surgical History No past surgical history on file. Family History No family history on file.  Social History Social History   Tobacco Use   Smoking status:  Some Days    Current packs/day: 0.15    Average packs/day: 0.2 packs/day for 15.0 years (2.3 ttl pk-yrs)    Types: Cigarettes   Smokeless tobacco: Never  Vaping Use   Vaping status: Never Used  Substance Use Topics   Alcohol use: Yes   Drug use: Yes    Types: Cocaine   Allergies Penicillins and Povidone-iodine  Review of Systems Review of Systems  Skin:  Positive for rash and wound.    Physical Exam Vital Signs  I have reviewed the triage vital signs BP (!) 150/90   Pulse 93   Temp 99.5 F (37.5 C)   Resp 18   SpO2 98%   Physical Exam Constitutional:      General: He is not in acute distress.    Appearance: Normal appearance.  HENT:     Head: Normocephalic and atraumatic.     Nose: No congestion or rhinorrhea.  Eyes:     General:        Right eye: No discharge.        Left eye: No discharge.     Extraocular Movements: Extraocular movements intact.     Pupils: Pupils are equal, round, and reactive to light.  Cardiovascular:     Rate and Rhythm: Normal rate and regular rhythm.     Heart sounds: No murmur  heard. Pulmonary:     Effort: No respiratory distress.     Breath sounds: No wheezing or rales.  Abdominal:     General: There is no distension.     Tenderness: There is no abdominal tenderness.  Musculoskeletal:        General: Swelling and tenderness present. Normal range of motion.     Cervical back: Normal range of motion.  Skin:    General: Skin is warm and dry.     Findings: Erythema and lesion present.  Neurological:     General: No focal deficit present.     Mental Status: He is alert.     ED Results and Treatments Labs (all labs ordered are listed, but only abnormal results are displayed) Labs Reviewed - No data to display                                                                                                                        Radiology No results found.  Pertinent labs & imaging results that were available during my care of the patient were reviewed by me and considered in my medical decision making (see MDM for details).  Medications Ordered in ED Medications  lidocaine-EPINEPHrine (XYLOCAINE W/EPI) 2 %-1:200000 (PF) injection 10 mL (has no administration in time range)  doxycycline (VIBRA-TABS) tablet 100 mg (has no administration in time range)  Procedures .Incision and Drainage  Date/Time: 04/23/2023 4:12 AM  Performed by: Glendora Score, MD Authorized by: Glendora Score, MD   Location:    Type:  Abscess   Size:  2 cm   Location:  Upper extremity   Upper extremity location:  Hand   Hand location:  L hand Pre-procedure details:    Skin preparation:  Chlorhexidine with alcohol Sedation:    Sedation type:  None Anesthesia:    Anesthesia method:  Local infiltration   Local anesthetic:  Lidocaine 2% WITH epi Procedure type:    Complexity:  Simple Procedure details:    Incision types:  Single straight    Drainage:  Bloody   Drainage amount:  Scant   Wound treatment:  Wound left open Post-procedure details:    Procedure completion:  Tolerated well, no immediate complications   (including critical care time)  Medical Decision Making / ED Course   This patient presents to the ED for concern of hand pain, this involves an extensive number of treatment options, and is a complaint that carries with it a high risk of complications and morbidity.  The differential diagnosis includes cellulitis, fracture, abscess, abrasion, dislocation  MDM: Patient seen emergency room for evaluation of hand pain.  Physical exam with erythema with induration over the thenar eminence on the left but is otherwise unremarkable.  Incision and drainage performed with evacuation of very minimal purulent fluid.  Patient given doxycycline and wound covered in Bactroban.  Patient was seen yesterday and prescribed doxycycline and patient encouraged to pick up this medication to complete in the outpatient setting.  Imaging also performed yesterday and given no repeat history of trauma we will defer this repeat scan today.  At this time he does not meet inpatient criteria for admission and will be discharged with outpatient follow-up.  Patient observed in the ER for over 5 hours and he has successfully metabolized suspected underlying alcohol.   Additional history obtained:  -External records from outside source obtained and reviewed including: Chart review including previous notes, labs, imaging, consultation notes   Medicines ordered and prescription drug management: Meds ordered this encounter  Medications   lidocaine-EPINEPHrine (XYLOCAINE W/EPI) 2 %-1:200000 (PF) injection 10 mL   doxycycline (VIBRA-TABS) tablet 100 mg    -I have reviewed the patients home medicines and have made adjustments as needed  Critical interventions none    Social Determinants of Health:  Factors impacting patients care include:  Homeless   Reevaluation: After the interventions noted above, I reevaluated the patient and found that they have :improved  Co morbidities that complicate the patient evaluation  Past Medical History:  Diagnosis Date   Asthma    DM (diabetes mellitus) (HCC)    Homeless    Hypertension    Neuropathy    Schizoaffective disorder, bipolar type (HCC)       Dispostion: I considered admission for this patient, but at this time he does not meet inpatient criteria for admission and will be discharged with outpatient follow-up     Final Clinical Impression(s) / ED Diagnoses Final diagnoses:  None     @PCDICTATION @    Aero Drummonds, Wyn Forster, MD 04/23/23 [phone]

## 2023-04-24 ENCOUNTER — Emergency Department (HOSPITAL_COMMUNITY)
Admission: EM | Admit: 2023-04-24 | Discharge: 2023-04-24 | Disposition: A | Discharge status: Home / Self Care | Payer: PPO | Attending: Emergency Medicine | Admitting: Emergency Medicine

## 2023-04-24 ENCOUNTER — Other Ambulatory Visit: Payer: Self-pay

## 2023-04-24 ENCOUNTER — Emergency Department (HOSPITAL_COMMUNITY): Payer: PPO

## 2023-04-24 DIAGNOSIS — M4692 Unspecified inflammatory spondylopathy, cervical region: Secondary | ICD-10-CM | POA: Diagnosis not present

## 2023-04-24 DIAGNOSIS — M4602 Spinal enthesopathy, cervical region: Secondary | ICD-10-CM | POA: Diagnosis not present

## 2023-04-24 DIAGNOSIS — L03113 Cellulitis of right upper limb: Secondary | ICD-10-CM | POA: Diagnosis not present

## 2023-04-24 DIAGNOSIS — M47812 Spondylosis without myelopathy or radiculopathy, cervical region: Secondary | ICD-10-CM | POA: Diagnosis not present

## 2023-04-24 DIAGNOSIS — M438X2 Other specified deforming dorsopathies, cervical region: Secondary | ICD-10-CM | POA: Diagnosis not present

## 2023-04-24 DIAGNOSIS — M542 Cervicalgia: Secondary | ICD-10-CM | POA: Diagnosis not present

## 2023-04-24 DIAGNOSIS — L03119 Cellulitis of unspecified part of limb: Secondary | ICD-10-CM

## 2023-04-24 MED ORDER — DOXYCYCLINE HYCLATE 100 MG PO TABS
100.0000 mg | ORAL_TABLET | Freq: Once | ORAL | Status: DC
  Filled 2023-04-24: qty 1

## 2023-04-24 MED ORDER — IBUPROFEN 800 MG PO TABS
800.0000 mg | ORAL_TABLET | Freq: Once | ORAL | Status: AC
Start: 2023-04-24 — End: 2023-04-24
  Administered 2023-04-24: 800 mg via ORAL
  Filled 2023-04-24: qty 1

## 2023-04-24 NOTE — ED Provider Notes (Signed)
[place] EMERGENCY DEPARTMENT AT [facility] Provider Note   CSN: [id number] Arrival date & time: 04/24/23  1645     History  Chief Complaint  Patient presents with   Neck Pain    Alejandro Baker is a 47 y.o. male history of homelessness, polysubstance abuse here presenting with neck pain.  Patient was seen multiple times recently for left hand pain.  Patient was most recently seen 2 days ago and wound was checked and patient was prescribed antibiotics.  Patient states that his hand is feeling fine today but now he has some posterior neck pain.  Denies any trauma or fall.  Patient states that he is homeless but does have a bed at [facility].  Patient denies any meds prior to arrival.  Patient was noted to be intoxicated yesterday.  He denies drinking alcohol after he left the ER.   The history is provided by the patient.       Home Medications Prior to Admission medications   Medication Sig Start Date End Date Taking? Authorizing Provider  albuterol (VENTOLIN HFA) 108 (90 Base) MCG/ACT inhaler Inhale 1-2 puffs into the lungs every 6 (six) hours as needed for wheezing or shortness of breath. 05/19/21   Prosperi, Christian H, PA-C  benzonatate (TESSALON) 100 MG capsule Take 1 capsule (100 mg total) by mouth every 8 (eight) hours. 02/18/23   Smoot, Shawn Route, PA-C  doxycycline (VIBRAMYCIN) 100 MG capsule Take 1 capsule (100 mg total) by mouth 2 (two) times daily. 04/22/23   Dione Booze, MD  fluticasone [facility]) 50 MCG/ACT nasal spray Place 1 spray into both nostrils daily. 05/19/21   Prosperi, Christian H, PA-C  metFORMIN (GLUCOPHAGE) 500 MG tablet Take 1 tablet (500 mg total) by mouth 2 (two) times daily with a meal. 04/22/23   Dione Booze, MD  naproxen (NAPROSYN) 500 MG tablet Take 1 tablet (500 mg total) by mouth 2 (two) times daily with a meal. 03/10/23   Rancour, Jeannett Senior, MD  ondansetron (ZOFRAN) 4 MG tablet Take 1 tablet (4 mg total) by mouth every 6 (six) hours as needed for  nausea or vomiting. 04/09/23   Achille Rich, PA-C  risperiDONE (RISPERDAL) 2 MG tablet Take 1 tablet (2 mg total) by mouth at bedtime. 04/10/22 10/07/22  Alicia Amel, MD      Allergies    Penicillins and Povidone-iodine    Review of Systems   Review of Systems  Musculoskeletal:  Positive for neck pain.  All other systems reviewed and are negative.   Physical Exam Updated Vital Signs BP (!) 151/98   Pulse (!) 105   Temp 98.8 F (37.1 C)   Resp 19   SpO2 96%  Physical Exam Vitals and nursing note reviewed.  Constitutional:      Comments: Chronically ill and disheveled  HENT:     Head: Normocephalic.     Nose: Nose normal.     Mouth/Throat:     Mouth: Mucous membranes are moist.  Eyes:     Extraocular Movements: Extraocular movements intact.     Pupils: Pupils are equal, round, and reactive to light.  Neck:     Comments: Mild right paracervical tenderness.  No midline tenderness.  Normal range of motion of the neck Pulmonary:     Effort: Pulmonary effort is normal.     Breath sounds: Normal breath sounds.  Abdominal:     General: Abdomen is flat.     Palpations: Abdomen is soft.  Musculoskeletal:  General: Normal range of motion.     Comments: Left hand with dressing in place  Skin:    General: Skin is warm.  Neurological:     General: No focal deficit present.     Mental Status: He is oriented to person, place, and time.  Psychiatric:        Mood and Affect: Mood normal.        Behavior: Behavior normal.     ED Results / Procedures / Treatments   Labs (all labs ordered are listed, but only abnormal results are displayed) Labs Reviewed - No data to display  EKG None  Radiology No results found.  Procedures Procedures    Medications Ordered in ED Medications  ibuprofen (ADVIL) tablet 800 mg (has no administration in time range)    ED Course/ Medical Decision Making/ A&P                                 Medical Decision Making Alejandro Baker is a 47 y.o. male here presenting with neck pain.  He had similar complaints recently.  Will get x-ray of the cervical spine and give ibuprofen.  I suspect musculoskeletal in nature and patient does have significant social issues.  7:15 PM X-ray showed arthritis.  Patient did not fill his doxycycline for left hand cellulitis so I gave him a dose.  He got some food as well.  At this point he is stable for discharge.  Problems Addressed: Neck arthritis: chronic illness or injury  Amount and/or Complexity of Data Reviewed Radiology: ordered.  Risk Prescription drug management.    Final Clinical Impression(s) / ED Diagnoses Final diagnoses:  None    Rx / DC Orders ED Discharge Orders     None         Charlynne Pander, MD 04/24/23 [phone]

## 2023-04-24 NOTE — Discharge Instructions (Addendum)
You have arthritis in your neck.  I recommend Motrin 800 mg every 6 hours as needed.  Please continue your doxycycline as prescribed several days ago for your hand  See your doctor for follow-up  Return to ER if you have worse neck pain or numbness or weakness

## 2023-04-25 ENCOUNTER — Emergency Department (HOSPITAL_COMMUNITY)
Admission: EM | Admit: 2023-04-25 | Discharge: 2023-04-25 | Disposition: A | Discharge status: Home / Self Care | Payer: PPO | Attending: Emergency Medicine | Admitting: Emergency Medicine

## 2023-04-25 ENCOUNTER — Other Ambulatory Visit: Payer: Self-pay

## 2023-04-25 ENCOUNTER — Telehealth: Payer: Self-pay | Admitting: *Deleted

## 2023-04-25 ENCOUNTER — Encounter (HOSPITAL_COMMUNITY): Payer: Self-pay

## 2023-04-25 DIAGNOSIS — M79642 Pain in left hand: Secondary | ICD-10-CM | POA: Insufficient documentation

## 2023-04-25 DIAGNOSIS — M542 Cervicalgia: Secondary | ICD-10-CM | POA: Diagnosis not present

## 2023-04-25 DIAGNOSIS — Z7984 Long term (current) use of oral hypoglycemic drugs: Secondary | ICD-10-CM | POA: Insufficient documentation

## 2023-04-25 MED ORDER — IBUPROFEN 800 MG PO TABS
800.0000 mg | ORAL_TABLET | Freq: Once | ORAL | Status: AC
  Administered 2023-04-25: 800 mg via ORAL
  Filled 2023-04-25: qty 1

## 2023-04-25 NOTE — Discharge Instructions (Addendum)
Take your prescribed antibiotics.  You may use over-the-counter Tylenol or ibuprofen for pain.  Follow-up with a primary care doctor for wound recheck in 48 to 72 hours.

## 2023-04-25 NOTE — ED Provider Notes (Signed)
[place] EMERGENCY DEPARTMENT AT [facility] Provider Note   CSN: [id number] Arrival date & time: 04/25/23  0865     History  Chief Complaint  Patient presents with   Hand Pain    Alejandro Baker is a 47 y.o. male.  47 year old male presents to the emergency department for evaluation of left hand pain.  He has been seen frequently for this complaint over the past few days.  Was also evaluated in this ED a few hours ago for neck pain.  Has not filled his prescribed antibiotic for presumed cellulitis of the left hand.  Was given a dose of this antibiotic at his prior ED visit.  History of homelessness.  The history is provided by the patient. No language interpreter was used.  Hand Pain       Home Medications Prior to Admission medications   Medication Sig Start Date End Date Taking? Authorizing Provider  albuterol (VENTOLIN HFA) 108 (90 Base) MCG/ACT inhaler Inhale 1-2 puffs into the lungs every 6 (six) hours as needed for wheezing or shortness of breath. 05/19/21   Prosperi, Christian H, PA-C  benzonatate (TESSALON) 100 MG capsule Take 1 capsule (100 mg total) by mouth every 8 (eight) hours. 02/18/23   Smoot, Shawn Route, PA-C  doxycycline (VIBRAMYCIN) 100 MG capsule Take 1 capsule (100 mg total) by mouth 2 (two) times daily. 04/22/23   Dione Booze, MD  fluticasone [facility]) 50 MCG/ACT nasal spray Place 1 spray into both nostrils daily. 05/19/21   Prosperi, Christian H, PA-C  metFORMIN (GLUCOPHAGE) 500 MG tablet Take 1 tablet (500 mg total) by mouth 2 (two) times daily with a meal. 04/22/23   Dione Booze, MD  naproxen (NAPROSYN) 500 MG tablet Take 1 tablet (500 mg total) by mouth 2 (two) times daily with a meal. 03/10/23   Rancour, Jeannett Senior, MD  ondansetron (ZOFRAN) 4 MG tablet Take 1 tablet (4 mg total) by mouth every 6 (six) hours as needed for nausea or vomiting. 04/09/23   Achille Rich, PA-C  risperiDONE (RISPERDAL) 2 MG tablet Take 1 tablet (2 mg total) by mouth at  bedtime. 04/10/22 10/07/22  Alicia Amel, MD      Allergies    Penicillins and Povidone-iodine    Review of Systems   Review of Systems Ten systems reviewed and are negative for acute change, except as noted in the HPI.    Physical Exam Updated Vital Signs BP (!) 154/110 (BP Location: Right Arm)   Pulse 98   Temp 98.2 F (36.8 C) (Oral)   Resp 20   Ht 6' (1.829 m)   Wt 78 kg   SpO2 98%   BMI 23.33 kg/m   Physical Exam Vitals and nursing note reviewed.  Constitutional:      General: He is not in acute distress.    Appearance: He is well-developed. He is not diaphoretic.     Comments: Nontoxic appearing  HENT:     Head: Normocephalic and atraumatic.  Eyes:     General: No scleral icterus.    Conjunctiva/sclera: Conjunctivae normal.  Cardiovascular:     Rate and Rhythm: Normal rate and regular rhythm.     Pulses: Normal pulses.     Comments: Distal radial pulse 2+ LUE Pulmonary:     Effort: Pulmonary effort is normal. No respiratory distress.  Musculoskeletal:        General: Normal range of motion.     Cervical back: Normal range of motion.     Comments:  Area of cellulitis to radial aspect of the dorsum of the L hand appears to be improving compared to prior imaging. No fluctuance, significant induration, purulent drainage. Preserved ROM of L wrist and all digits of the L hand. No lymphangitic streaking.  Skin:    General: Skin is warm and dry.     Coloration: Skin is not pale.     Findings: No erythema or rash.  Neurological:     Mental Status: He is alert and oriented to person, place, and time.     Coordination: Coordination normal.  Psychiatric:        Behavior: Behavior normal.       ED Results / Procedures / Treatments   Labs (all labs ordered are listed, but only abnormal results are displayed) Labs Reviewed - No data to display  EKG None  Radiology DG Cervical Spine Complete Result Date: 04/24/2023 CLINICAL DATA:  Neck pain. EXAM: CERVICAL  SPINE - COMPLETE 4+ VIEW COMPARISON:  10/19/2022. FINDINGS: Anatomic cervical curvature. No spondylolisthesis. Vertebral body heights are maintained. No fracture or destructive lesion. The C1 lateral masses are symmetrically positioned around the odontoid process. Mildly reduced C6-7 intervertebral disc height. Remaining intervertebral disc heights are maintained. Mild marginal osteophyte formation predominantly from C5 through C7 levels. Prevertebral soft tissues within normal limits. IMPRESSION: *Mild multilevel degenerative changes predominantly of the lower cervical spine. Electronically Signed   By: Jules Schick M.D.   On: 04/24/2023 19:04    Procedures Procedures    Medications Ordered in ED Medications  ibuprofen (ADVIL) tablet 800 mg (800 mg Oral Given 04/25/23 0145)    ED Course/ Medical Decision Making/ A&P                                 Medical Decision Making Risk Prescription drug management.   This patient presents to the ED for concern of L hand pain, this involves an extensive number of treatment options, and is a complaint that carries with it a high risk of complications and morbidity.  The differential diagnosis includes arthritis vs contusion vs fx vs cellulitis vs septic joint.   Co morbidities that complicate the patient evaluation  Polysubstance abuse   Cardiac Monitoring:  The patient was maintained on a cardiac monitor.  I personally viewed and interpreted the cardiac monitored which showed an underlying rhythm of: NSR   Medicines ordered and prescription drug management:  I ordered medication including ibuprofen for pain  I have reviewed the patients home medicines and have made adjustments as needed   Test Considered:  CBC for trending   Problem List / ED Course:  As above Neurovascularly intact. Area of cellulitis appears to be improving despite noncompliance. Encourage patient fill his Rx for abx prescribed previously. Wound rewrapped  without issue. Given ibuprofen for pain prior to discharge.   Reevaluation:  After the interventions noted above, I reevaluated the patient and found that they have :stayed the same   Social Determinants of Health:  Homeless    Dispostion:  After consideration of the diagnostic results and the patients response to treatment, I feel that the patent would benefit from adherence to prescribed abx, continuation of basic wound care. Stable for wound check with PCP as outpatient. High suspicion for malingering in the setting of homelessness.          Final Clinical Impression(s) / ED Diagnoses Final diagnoses:  Left hand pain    Rx /  DC Orders ED Discharge Orders     None         Antony Madura, PA-C 04/25/23 0201    Nira Conn, MD 04/25/23 [phone]

## 2023-04-25 NOTE — ED Triage Notes (Signed)
Pt came in with complaints of left hand swelling.

## 2023-04-25 NOTE — Progress Notes (Unsigned)
Complex Care Management Note Care Guide Note  04/25/2023 Name: Alejandro Baker MRN: [id number] DOB: 1976-01-26   Complex Care Management Outreach Attempts: An unsuccessful telephone outreach was attempted today to offer the patient information about available complex care management services.  Follow Up Plan:  Additional outreach attempts will be made to offer the patient complex care management information and services.   Encounter Outcome:  No Answer  Gwenevere Ghazi  Care Coordination Care Guide  Direct Dial: [phone]

## 2023-04-28 ENCOUNTER — Emergency Department (HOSPITAL_COMMUNITY): Payer: PPO

## 2023-04-28 ENCOUNTER — Other Ambulatory Visit: Payer: Self-pay

## 2023-04-28 ENCOUNTER — Emergency Department (HOSPITAL_COMMUNITY)
Admission: EM | Admit: 2023-04-28 | Discharge: 2023-04-28 | Disposition: A | Discharge status: Home / Self Care | Payer: PPO | Attending: Emergency Medicine | Admitting: Emergency Medicine

## 2023-04-28 ENCOUNTER — Encounter (HOSPITAL_COMMUNITY): Payer: Self-pay | Admitting: Emergency Medicine

## 2023-04-28 ENCOUNTER — Encounter (HOSPITAL_COMMUNITY): Payer: Self-pay

## 2023-04-28 ENCOUNTER — Emergency Department (HOSPITAL_COMMUNITY)
Admission: EM | Admit: 2023-04-28 | Discharge: 2023-04-29 | Disposition: A | Discharge status: Home / Self Care | Payer: PPO | Source: Home / Self Care | Attending: Emergency Medicine | Admitting: Emergency Medicine

## 2023-04-28 DIAGNOSIS — E119 Type 2 diabetes mellitus without complications: Secondary | ICD-10-CM | POA: Insufficient documentation

## 2023-04-28 DIAGNOSIS — I10 Essential (primary) hypertension: Secondary | ICD-10-CM | POA: Insufficient documentation

## 2023-04-28 DIAGNOSIS — M79673 Pain in unspecified foot: Secondary | ICD-10-CM | POA: Diagnosis not present

## 2023-04-28 DIAGNOSIS — Z79899 Other long term (current) drug therapy: Secondary | ICD-10-CM | POA: Insufficient documentation

## 2023-04-28 DIAGNOSIS — S61502A Unspecified open wound of left wrist, initial encounter: Secondary | ICD-10-CM | POA: Diagnosis not present

## 2023-04-28 DIAGNOSIS — L03114 Cellulitis of left upper limb: Secondary | ICD-10-CM | POA: Insufficient documentation

## 2023-04-28 DIAGNOSIS — M79642 Pain in left hand: Secondary | ICD-10-CM | POA: Diagnosis not present

## 2023-04-28 DIAGNOSIS — M79672 Pain in left foot: Secondary | ICD-10-CM | POA: Diagnosis not present

## 2023-04-28 DIAGNOSIS — Z5900 Homelessness unspecified: Secondary | ICD-10-CM | POA: Insufficient documentation

## 2023-04-28 DIAGNOSIS — J45909 Unspecified asthma, uncomplicated: Secondary | ICD-10-CM | POA: Diagnosis not present

## 2023-04-28 DIAGNOSIS — F17210 Nicotine dependence, cigarettes, uncomplicated: Secondary | ICD-10-CM | POA: Insufficient documentation

## 2023-04-28 DIAGNOSIS — M79643 Pain in unspecified hand: Secondary | ICD-10-CM | POA: Insufficient documentation

## 2023-04-28 DIAGNOSIS — Z7984 Long term (current) use of oral hypoglycemic drugs: Secondary | ICD-10-CM | POA: Insufficient documentation

## 2023-04-28 DIAGNOSIS — G8911 Acute pain due to trauma: Secondary | ICD-10-CM | POA: Diagnosis not present

## 2023-04-28 NOTE — ED Provider Notes (Signed)
[facility]PT [facility] Emergency Department Provider Note MRN:  [id number]  Arrival date & time: 04/28/23     Chief Complaint   Hand Pain   History of Present Illness   Alejandro Baker is a 47 y.o. year-old male with a history of diabetes presenting to the ED with chief complaint of hand pain.  Endorsed hand pain in triage.  With me he ignores my questions and tries to sleep.  Review of Systems  A thorough review of systems was obtained and all systems are negative except as noted in the HPI and PMH.   Patient's Health History    Past Medical History:  Diagnosis Date   Asthma    DM (diabetes mellitus) (HCC)    Homeless    Hypertension    Neuropathy    Schizoaffective disorder, bipolar type (HCC)     History reviewed. No pertinent surgical history.  History reviewed. No pertinent family history.  Social History   Socioeconomic History   Marital status: Single    Spouse name: Not on file   Number of children: Not on file   Years of education: Not on file   Highest education level: Not on file  Occupational History   Not on file  Tobacco Use   Smoking status: Some Days    Current packs/day: 0.15    Average packs/day: 0.2 packs/day for 15.0 years (2.3 ttl pk-yrs)    Types: Cigarettes   Smokeless tobacco: Never  Vaping Use   Vaping status: Never Used  Substance and Sexual Activity   Alcohol use: Yes   Drug use: Yes    Types: Cocaine   Sexual activity: Not on file  Other Topics Concern   Not on file  Social History Narrative   ** Merged History Encounter **       ** Merged History Encounter **       ** Merged History Encounter **       Social Drivers of Corporate investment banker Strain: Not on file  Food Insecurity: Not on file  Transportation Needs: Not on file  Physical Activity: Not on file  Stress: Not on file  Social Connections: Not on file  Intimate Partner Violence: Not on file     Physical Exam   Vitals:   04/28/23 0319   BP: (!) 148/82  Pulse: 86  Resp: 20  Temp: 98.2 F (36.8 C)  SpO2: 100%    CONSTITUTIONAL: Chronically ill-appearing, NAD NEURO/PSYCH:  Alert and oriented x 3, no focal deficits EYES:  eyes equal and reactive ENT/NECK:  no LAD, no JVD CARDIO: Regular rate, well-perfused, normal S1 and S2 PULM:  CTAB no wheezing or rhonchi GI/GU:  non-distended, non-tender MSK/SPINE:  No gross deformities, no edema SKIN: Callus to feet   *Additional and/or pertinent findings included in MDM below  Diagnostic and Interventional Summary    EKG Interpretation Date/Time:    Ventricular Rate:    PR Interval:    QRS Duration:    QT Interval:    QTC Calculation:   R Axis:      Text Interpretation:         Labs Reviewed - No data to display  No orders to display    Medications - No data to display   Procedures  /  Critical Care Procedures  ED Course and Medical Decision Making  Initial Impression and Ddx Very frequent ED visits for similar complaints, nonparticipatory with H&P at this time.  Resting comfortably no acute  distress  Past medical/surgical history that increases complexity of ED encounter: Diabetes  Interpretation of Diagnostics Laboratory and/or imaging options to aid in the diagnosis/care of the patient were considered.  After careful history and physical examination, it was determined that there was no indication for diagnostics at this time.  Patient Reassessment and Ultimate Disposition/Management     Nothing to suggest emergent process, appropriate for discharge.  Patient management required discussion with the following services or consulting groups:  None  Complexity of Problems Addressed Acute complicated illness or Injury  Additional Data Reviewed and Analyzed Further history obtained from: None  Additional Factors Impacting ED Encounter Risk None  Elmer Sow. Pilar Plate, MD [facility] Emergency Medicine [facility]  Health [email or web address]  Final Clinical Impressions(s) / ED Diagnoses     ICD-10-CM   1. Pain of hand, unspecified laterality  M79.643       ED Discharge Orders     None        Discharge Instructions Discussed with and Provided to Patient:    Discharge Instructions      You were evaluated in the Emergency Department and after careful evaluation, we did not find any emergent condition requiring admission or further testing in the hospital.  Your exam/testing today is overall reassuring.  Please return to the Emergency Department if you experience any worsening of your condition.   Thank you for allowing [place] to be a part of your care.      Sabas Sous, MD 04/28/23 [phone]

## 2023-04-28 NOTE — ED Provider Triage Note (Cosign Needed)
Emergency Medicine Provider Triage Evaluation Note  Alejandro Baker , a 47 y.o. male  was evaluated in triage.  Pt complains of L wrist/hand pain. Rash x2 weeks. No fevers or chills.  Review of Systems  Positive: wound Negative: fevers  Physical Exam  There were no vitals taken for this visit. Gen:   Awake, no distress   Resp:  Normal effort  MSK:   Moves extremities without difficulty  Other:  +purulent mass on L 1st metacarpal, +radial pulse  Medical Decision Making  Medically screening exam initiated at 7:47 PM.  Appropriate orders placed.  Alejandro Baker was informed that the remainder of the evaluation will be completed by another provider, this initial triage assessment does not replace that evaluation, and the importance of remaining in the ED until their evaluation is complete.    Pete Pelt, [place] 04/28/23 1947
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
## 2023-04-28 NOTE — Discharge Instructions (Signed)
You were evaluated in the Emergency Department and after careful evaluation, we did not find any emergent condition requiring admission or further testing in the hospital.  Your exam/testing today is overall reassuring.  Please return to the Emergency Department if you experience any worsening of your condition.   Thank you for allowing us to be a part of your care. 

## 2023-04-29 MED ORDER — CEPHALEXIN 500 MG PO CAPS: 500.0000 mg | ORAL_CAPSULE | Freq: Four times a day (QID) | ORAL | 0 refills | Status: DC

## 2023-04-29 MED ORDER — CEPHALEXIN 250 MG PO CAPS: 500.0000 mg | ORAL_CAPSULE | Freq: Once | ORAL | Status: DC

## 2023-04-29 MED ORDER — DOXYCYCLINE HYCLATE 100 MG PO CAPS: 100.0000 mg | ORAL_CAPSULE | Freq: Two times a day (BID) | ORAL | 0 refills | Status: DC
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
## 2023-04-29 NOTE — Discharge Instructions (Addendum)
Please take the entire course of antibiotics that I prescribed, monitor for any signs of worsening infection.  Please return if you have worsening pain, swelling, pus draining from the affected site despite treatment.

## 2023-05-01 ENCOUNTER — Other Ambulatory Visit: Payer: Self-pay

## 2023-05-01 ENCOUNTER — Emergency Department (HOSPITAL_COMMUNITY)
Admission: EM | Admit: 2023-05-01 | Discharge: 2023-05-02 | Disposition: A | Discharge status: Home / Self Care | Payer: PPO | Source: Home / Self Care | Attending: Emergency Medicine | Admitting: Emergency Medicine

## 2023-05-01 ENCOUNTER — Encounter (HOSPITAL_COMMUNITY): Payer: Self-pay | Admitting: Emergency Medicine

## 2023-05-01 ENCOUNTER — Emergency Department (HOSPITAL_COMMUNITY)
Admission: EM | Admit: 2023-05-01 | Discharge: 2023-05-01 | Discharge status: Against Medical Advice | Payer: PPO | Attending: Emergency Medicine | Admitting: Emergency Medicine

## 2023-05-01 DIAGNOSIS — L02512 Cutaneous abscess of left hand: Secondary | ICD-10-CM | POA: Insufficient documentation

## 2023-05-01 DIAGNOSIS — M79645 Pain in left finger(s): Secondary | ICD-10-CM | POA: Diagnosis present

## 2023-05-01 DIAGNOSIS — Z5321 Procedure and treatment not carried out due to patient leaving prior to being seen by health care provider: Secondary | ICD-10-CM | POA: Insufficient documentation

## 2023-05-01 DIAGNOSIS — E1165 Type 2 diabetes mellitus with hyperglycemia: Secondary | ICD-10-CM | POA: Diagnosis not present

## 2023-05-01 DIAGNOSIS — R739 Hyperglycemia, unspecified: Secondary | ICD-10-CM | POA: Insufficient documentation

## 2023-05-01 DIAGNOSIS — L0291 Cutaneous abscess, unspecified: Secondary | ICD-10-CM

## 2023-05-01 LAB — CBC WITH DIFFERENTIAL/PLATELET
Abs Immature Granulocytes: 0.04 10*3/uL (ref 0.00–0.07)
Basophils Absolute: 0.1 10*3/uL (ref 0.0–0.1)
Basophils Relative: 1 %
Eosinophils Absolute: 0.1 10*3/uL (ref 0.0–0.5)
Eosinophils Relative: 1 %
HCT: 43.8 % (ref 39.0–52.0)
Hemoglobin: 14.7 g/dL (ref 13.0–17.0)
Immature Granulocytes: 0 %
Lymphocytes Relative: 30 %
Lymphs Abs: 2.7 10*3/uL (ref 0.7–4.0)
MCH: 30.1 pg (ref 26.0–34.0)
MCHC: 33.6 g/dL (ref 30.0–36.0)
MCV: 89.8 fL (ref 80.0–100.0)
Monocytes Absolute: 0.6 10*3/uL (ref 0.1–1.0)
Monocytes Relative: 6 %
Neutro Abs: 5.5 10*3/uL (ref 1.7–7.7)
Neutrophils Relative %: 62 %
Platelets: 338 10*3/uL (ref 150–400)
RBC: 4.88 MIL/uL (ref 4.22–5.81)
RDW: 12.8 % (ref 11.5–15.5)
WBC: 9 10*3/uL (ref 4.0–10.5)
nRBC: 0 % (ref 0.0–0.2)

## 2023-05-01 LAB — COMPREHENSIVE METABOLIC PANEL
ALT: 17 U/L (ref 0–44)
AST: 24 U/L (ref 15–41)
Albumin: 3.7 g/dL (ref 3.5–5.0)
Alkaline Phosphatase: 75 U/L (ref 38–126)
Anion gap: 11 (ref 5–15)
BUN: 18 mg/dL (ref 6–20)
CO2: 21 mmol/L — ABNORMAL LOW (ref 22–32)
Calcium: 8.9 mg/dL (ref 8.9–10.3)
Chloride: 98 mmol/L (ref 98–111)
Creatinine, Ser: 0.91 mg/dL (ref 0.61–1.24)
GFR, Estimated: >60 mL/min (ref >60)
Glucose, Bld: 526 mg/dL (ref 70–99)
Potassium: 3.9 mmol/L (ref 3.5–5.1)
Sodium: 130 mmol/L — ABNORMAL LOW (ref 135–145)
Total Bilirubin: 0.3 mg/dL (ref 0.0–1.2)
Total Protein: 7.1 g/dL (ref 6.5–8.1)

## 2023-05-01 LAB — CBG MONITORING, ED: Glucose-Capillary: 332 mg/dL — ABNORMAL HIGH (ref 70–99)

## 2023-05-01 MED ORDER — LIDOCAINE-EPINEPHRINE (PF) 2 %-1:200000 IJ SOLN
10.0000 mL | Freq: Once | INTRAMUSCULAR | Status: AC
  Administered 2023-05-01: 10 mL
  Filled 2023-05-01: qty 20

## 2023-05-01 MED ORDER — INSULIN ASPART 100 UNIT/ML IJ SOLN
10.0000 [IU] | Freq: Once | INTRAMUSCULAR | Status: AC
  Administered 2023-05-01: 10 [IU] via SUBCUTANEOUS
  Filled 2023-05-01: qty 0.1

## 2023-05-01 NOTE — ED Triage Notes (Signed)
 Pt arrives w/ [facility] w/ c/o abscess on his left hand x 2 weeks.
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
## 2023-05-01 NOTE — ED Provider Notes (Signed)
 [facility] Emergency Department Provider Note MRN:  [id number]  Arrival date & time: 05/02/23     Chief Complaint   Hand Pain   History of Present Illness   Alejandro Baker is a 48 y.o. year-old male presents to the ED with chief complaint of left thumb pain.  He has a wound that has been slow to heal.  He reports that he hasn't been able to afford his antibiotics.  He denies fever.  He denies trouble with ROM.  He states that the area has been bothering him for about 2 weeks.  History provided by patient.   Review of Systems  Pertinent positive and negative review of systems noted in HPI.    Physical Exam   Vitals:   05/01/23 2027 05/01/23 2258  BP: (!) 130/97 132/79  Pulse: (!) 118 94  Resp: 19 16  Temp: 98.5 F (36.9 C)   SpO2: 98% 97%    CONSTITUTIONAL:  non toxic-appearing, NAD NEURO:  Alert and oriented x 3, CN 3-12 grossly intact EYES:  eyes equal and reactive ENT/NECK:  Supple, no stridor  CARDIO:  tachycardic, regular rhythm, appears well-perfused  PULM:  No respiratory distress,  GI/GU:  non-distended,  MSK/SPINE:  No gross deformities, no edema, moves all extremities  SKIN:  no rash, atraumatic, 2x2cm abscess to the base of the left thumb on the posterior aspect   *Additional and/or pertinent findings included in MDM below  Diagnostic and Interventional Summary    EKG Interpretation Date/Time:    Ventricular Rate:    PR Interval:    QRS Duration:    QT Interval:    QTC Calculation:   R Axis:      Text Interpretation:         Labs Reviewed  COMPREHENSIVE METABOLIC PANEL - Abnormal; Notable for the following components:      Result Value   Sodium 130 (*)    CO2 21 (*)    Glucose, Bld 526 (*)    All other components within normal limits  CBG MONITORING, ED - Abnormal; Notable for the following components:   Glucose-Capillary 332 (*)    All other components within normal limits  CBC WITH DIFFERENTIAL/PLATELET    No  orders to display    Medications  insulin  aspart (novoLOG ) injection 10 Units (10 Units Subcutaneous Given 05/01/23 2301)  lidocaine -EPINEPHrine  (XYLOCAINE  W/EPI) 2 %-1:200000 (PF) injection 10 mL (10 mLs Infiltration Given by Other 05/01/23 2257)     Procedures  /  Critical Care .Incision and Drainage  Date/Time: 05/01/2023 10:46 PM  Performed by: Vicky Charleston, PA-C Authorized by: Vicky Charleston, PA-C   Consent:    Consent obtained:  Verbal   Consent given by:  Patient   Risks discussed:  Incomplete drainage, pain, infection and bleeding   Alternatives discussed:  No treatment Universal protocol:    Procedure explained and questions answered to patient or proxy's satisfaction: yes     Relevant documents present and verified: yes     Test results available : yes     Imaging studies available: yes     Required blood products, implants, devices, and special equipment available: yes     Site/side marked: yes     Immediately prior to procedure, a time out was called: yes     Patient identity confirmed:  Verbally with patient Location:    Type:  Abscess   Size:  2x2 cm   Location:  Upper extremity   Upper extremity location:  Hand   Hand location:  L hand Pre-procedure details:    Skin preparation:  Povidone-iodine Sedation:    Sedation type:  None Anesthesia:    Anesthesia method:  None Procedure type:    Complexity:  Simple Procedure details:    Incision types:  Stab incision   Incision depth:  Subcutaneous   Wound management:  Probed and deloculated   Drainage:  Bloody   Drainage amount:  Scant   Wound treatment:  Wound left open   Packing materials:  None Post-procedure details:    Procedure completion:  Tolerated well, no immediate complications   ED Course and Medical Decision Making  I have reviewed the triage vital signs, the nursing notes, and pertinent available records from the EMR.  Social Determinants Affecting Complexity of Care: Patient has no  clinically significant social determinants affecting this chief complaint..   ED Course: Clinical Course as of 05/02/23 0038  Thu May 02, 2023  0038 CBG monitoring, ED(!) Glucose is downtrending [RB]    Clinical Course User Index [RB] Vicky Charleston, PA-C    Medical Decision Making Patient here with abscess to the posterior/base of the left thumb.  No significant surrounding cellulitis.  Bedside I&D performed.    Glucose is high, but is always high.  Will give some insulin  and recheck.  Risk Prescription drug management.         Consultants: No consultations were needed in caring for this patient.   Treatment and Plan: Emergency department workup does not suggest an emergent condition requiring admission or immediate intervention beyond  what has been performed at this time. The patient is safe for discharge and has  been instructed to return immediately for worsening symptoms, change in  symptoms or any other concerns    Final Clinical Impressions(s) / ED Diagnoses     ICD-10-CM   1. Abscess  L02.91     2. Hyperglycemia  R73.9       ED Discharge Orders     None         Discharge Instructions Discussed with and Provided to Patient:   Discharge Instructions   None      Vicky Charleston, PA-C 05/02/23 0038    Ruthe Cornet, DO 05/04/23 2242

## 2023-05-01 NOTE — ED Triage Notes (Signed)
 Pt reports seen yesterday for right thumb and had x-ray done pt report home with script possibly but couldn't afford it possibly. Pt c/o pain 6/10, site is red, pt reports abscess to hand x 2 weeks

## 2023-05-01 NOTE — ED Notes (Signed)
 Pt. CBG 332, RN made aware.

## 2023-05-02 ENCOUNTER — Other Ambulatory Visit (HOSPITAL_COMMUNITY): Payer: Self-pay

## 2023-05-02 ENCOUNTER — Emergency Department (HOSPITAL_COMMUNITY)
Admission: EM | Admit: 2023-05-02 | Discharge: 2023-05-03 | Disposition: A | Discharge status: Home / Self Care | Payer: PPO | Attending: Emergency Medicine | Admitting: Emergency Medicine

## 2023-05-02 ENCOUNTER — Emergency Department (HOSPITAL_COMMUNITY)
Admission: EM | Admit: 2023-05-02 | Discharge: 2023-05-02 | Disposition: A | Discharge status: Home / Self Care | Payer: PPO | Attending: Emergency Medicine | Admitting: Emergency Medicine

## 2023-05-02 DIAGNOSIS — Z5321 Procedure and treatment not carried out due to patient leaving prior to being seen by health care provider: Secondary | ICD-10-CM | POA: Insufficient documentation

## 2023-05-02 DIAGNOSIS — M79643 Pain in unspecified hand: Secondary | ICD-10-CM | POA: Insufficient documentation

## 2023-05-02 DIAGNOSIS — M7989 Other specified soft tissue disorders: Secondary | ICD-10-CM | POA: Diagnosis present

## 2023-05-02 DIAGNOSIS — L0291 Cutaneous abscess, unspecified: Secondary | ICD-10-CM

## 2023-05-02 DIAGNOSIS — L02512 Cutaneous abscess of left hand: Secondary | ICD-10-CM | POA: Diagnosis not present

## 2023-05-02 MED ORDER — AMOXICILLIN-POT CLAVULANATE 875-125 MG PO TABS
1.0000 | ORAL_TABLET | Freq: Two times a day (BID) | ORAL | 0 refills | Status: DC
  Filled 2023-05-02: qty 14, 7d supply, fill #0

## 2023-05-02 MED ORDER — BACITRACIN ZINC 500 UNIT/GM EX OINT
1.0000 | TOPICAL_OINTMENT | Freq: Two times a day (BID) | CUTANEOUS | 0 refills | Status: AC
  Filled 2023-05-02: qty 120, 30d supply, fill #0

## 2023-05-02 MED ORDER — AMOXICILLIN-POT CLAVULANATE 875-125 MG PO TABS
1.0000 | ORAL_TABLET | Freq: Once | ORAL | Status: AC
  Administered 2023-05-02: 1 via ORAL
  Filled 2023-05-02: qty 1

## 2023-05-02 NOTE — ED Notes (Signed)
Pt. Refused vital signs. 

## 2023-05-02 NOTE — Discharge Instructions (Addendum)
 1.  Your wound has been cleaned and dressed.  Leave your dressing in place for 24 hours.  2.  After 24 hours, clean the wound well with mild soap and water  daily.  Apply bacitracin  ointment over it and a clean dressing. 3.  Go directly from the emergency department to the [facility] to pick up your prescription for Augmentin .  Take this medication twice daily till it is completely finished. 4.  Schedule appointment with your doctor to have wound recheck in 2 days.

## 2023-05-02 NOTE — ED Notes (Signed)
 Irrigated and cleaned wound on base of left thumb, applied dressing

## 2023-05-02 NOTE — ED Provider Notes (Signed)
 [place] EMERGENCY DEPARTMENT AT [facility] Provider Note   CSN: [id number] Arrival date & time: 05/02/23  9387     History  Chief Complaint  Patient presents with   Hand Pain    Alejandro Baker is a 48 y.o. male.  HPI Patient returns stating his hand wound has not improved.  He reports it was just drained yesterday.  He is coming in for recheck.  Patient reports he has still not filled his antibiotics.  No fevers or chills.    Home Medications Prior to Admission medications   Medication Sig Start Date End Date Taking? Authorizing Provider  amoxicillin -clavulanate (AUGMENTIN ) 875-125 MG tablet Take 1 tablet by mouth every 12 (twelve) hours. 05/02/23  Yes Armenta Canning, MD  bacitracin  ointment Apply 1 Application topically 2 (two) times daily. 05/02/23  Yes Armenta Canning, MD  albuterol  (VENTOLIN  HFA) 108 (90 Base) MCG/ACT inhaler Inhale 1-2 puffs into the lungs every 6 (six) hours as needed for wheezing or shortness of breath. 05/19/21   Prosperi, Christian H, PA-C  benzonatate  (TESSALON ) 100 MG capsule Take 1 capsule (100 mg total) by mouth every 8 (eight) hours. 02/18/23   Smoot, Lauraine LABOR, PA-C  fluticasone  (FLONASE ) 50 MCG/ACT nasal spray Place 1 spray into both nostrils daily. 05/19/21   Prosperi, Christian H, PA-C  metFORMIN  (GLUCOPHAGE ) 500 MG tablet Take 1 tablet (500 mg total) by mouth 2 (two) times daily with a meal. 04/22/23   Raford Lenis, MD  naproxen  (NAPROSYN ) 500 MG tablet Take 1 tablet (500 mg total) by mouth 2 (two) times daily with a meal. 03/10/23   Rancour, Garnette, MD  ondansetron  (ZOFRAN ) 4 MG tablet Take 1 tablet (4 mg total) by mouth every 6 (six) hours as needed for nausea or vomiting. 04/09/23   Bernis Ernst, PA-C  risperiDONE  (RISPERDAL ) 2 MG tablet Take 1 tablet (2 mg total) by mouth at bedtime. 04/10/22 10/07/22  Marlee Lynwood NOVAK, MD      Allergies    Penicillins and Povidone-iodine    Review of Systems   Review of Systems  Physical  Exam Updated Vital Signs BP (!) 141/97 (BP Location: Left Arm)   Pulse 87   Temp 98.2 F (36.8 C) (Oral)   Resp 16   SpO2 100%  Physical Exam Constitutional:      Comments: Patient is sleeping.  Disheveled.  Rouses to tactile stimulus.  Eyes:     Extraocular Movements: Extraocular movements intact.  Cardiovascular:     Rate and Rhythm: Normal rate and regular rhythm.  Pulmonary:     Effort: Pulmonary effort is normal.     Breath sounds: Normal breath sounds.  Musculoskeletal:     Comments: Recently incised erythematous indurated nodule on the left thumb base.  No diffuse erythema of the hand.  Normal range of motion of the thumb without any swelling of the thumb itself.  Abscess-like lesion with 2 eroded eschar's and a incision.  Nonfluctuant.  See attached images.     ED Results / Procedures / Treatments   Labs (all labs ordered are listed, but only abnormal results are displayed) Labs Reviewed - No data to display  EKG None  Radiology No results found.  Procedures Procedures    Medications Ordered in ED Medications  amoxicillin -clavulanate (AUGMENTIN ) 875-125 MG per tablet 1 tablet (1 tablet Oral Given 05/02/23 0834)    ED Course/ Medical Decision Making/ A&P  Medical Decision Making Risk Prescription drug management.   Patient presents for recheck of a wound that was incised yesterday.  Patient reports he has not yet picked up his antibiotics.  He cited financial problems.  Examination of the wound does not show any development of cellulitis of the hand or finger to suggest any associated tenosynovitis.  I have examined the wound and there is no active drainage.  Cannot express any further material from the incision.  At this time I do not suspect that there is a residual abscess pocket but predominantly focal indurated tissue.  At this point I believe it is still appropriate for the patient to be on oral antibiotic and perform local  wound care.  TOC was consulted. Augmentin  will be $5 at [facility] pharmacy, patient reports he can afford this and will pick up his precription.        Final Clinical Impression(s) / ED Diagnoses Final diagnoses:  Abscess    Rx / DC Orders ED Discharge Orders          Ordered    amoxicillin -clavulanate (AUGMENTIN ) 875-125 MG tablet  Every 12 hours        05/02/23 0806    bacitracin  ointment  2 times daily        05/02/23 0854              Armenta Canning, MD 05/02/23 [phone]

## 2023-05-02 NOTE — Progress Notes (Signed)
  Care Coordination Note  05/02/2023 Name: Alejandro Baker MRN: [id number] DOB: Sep 04, 1975  Alejandro Baker is a 48 y.o. year old male who is a primary care patient of [facility] and is actively engaged with the care management team. I reached out to Eva Cheadle by phone today to assist with scheduling an initial visit with the BSW  Follow up plan: Unsuccessful telephone outreach attempt made. A HIPAA compliant phone message was left for the patient providing contact information and requesting a return call.   SIGNATURE

## 2023-05-02 NOTE — ED Triage Notes (Signed)
 Pt seen this shift already. Pt reports that he was instructed to come back every 16 hours to have his hand cleaned out.

## 2023-05-02 NOTE — ED Notes (Signed)
 Pt refusing to leave when primary RN attempted to DC. This RN advised pt he needed to leave and that he isnt allowed to stay here. Pt responded with "bitch you need to leave, get the fuck out of my face". Pt ambulated with steady gait from the department.

## 2023-05-02 NOTE — Discharge Planning (Signed)
 RNCM consulted in regards to medication assistance.  Pt has Medicare insurance coverage and is not eligible for Medication Assistance Through [facility]) program. RNCM suggests sending Rx to  [facility]  to fill. No further CM needs communicated at this time.

## 2023-05-03 ENCOUNTER — Encounter (HOSPITAL_COMMUNITY): Payer: Self-pay

## 2023-05-03 ENCOUNTER — Encounter (HOSPITAL_COMMUNITY): Payer: Self-pay | Admitting: Emergency Medicine

## 2023-05-03 ENCOUNTER — Other Ambulatory Visit: Payer: Self-pay

## 2023-05-03 ENCOUNTER — Emergency Department (HOSPITAL_COMMUNITY)
Admission: EM | Admit: 2023-05-03 | Discharge: 2023-05-04 | Disposition: A | Discharge status: Home / Self Care | Payer: PPO | Attending: Emergency Medicine | Admitting: Emergency Medicine

## 2023-05-03 DIAGNOSIS — Z4800 Encounter for change or removal of nonsurgical wound dressing: Secondary | ICD-10-CM | POA: Insufficient documentation

## 2023-05-03 DIAGNOSIS — L02512 Cutaneous abscess of left hand: Secondary | ICD-10-CM | POA: Diagnosis not present

## 2023-05-03 DIAGNOSIS — I10 Essential (primary) hypertension: Secondary | ICD-10-CM | POA: Diagnosis not present

## 2023-05-03 DIAGNOSIS — E119 Type 2 diabetes mellitus without complications: Secondary | ICD-10-CM | POA: Diagnosis not present

## 2023-05-03 DIAGNOSIS — L0291 Cutaneous abscess, unspecified: Secondary | ICD-10-CM

## 2023-05-03 DIAGNOSIS — M79642 Pain in left hand: Secondary | ICD-10-CM | POA: Diagnosis present

## 2023-05-03 DIAGNOSIS — Z7984 Long term (current) use of oral hypoglycemic drugs: Secondary | ICD-10-CM | POA: Diagnosis not present

## 2023-05-03 DIAGNOSIS — R220 Localized swelling, mass and lump, head: Secondary | ICD-10-CM | POA: Diagnosis present

## 2023-05-03 DIAGNOSIS — Z5900 Homelessness unspecified: Secondary | ICD-10-CM | POA: Diagnosis not present

## 2023-05-03 NOTE — ED Triage Notes (Signed)
 Patient c/o hand pain. Patient was seen yesterday with same complain. Patient wants his hand to be clean.

## 2023-05-03 NOTE — ED Triage Notes (Signed)
 C/o left hand pain, pt had hand lanced here yesterday. States it still hurts. States he has congestion

## 2023-05-04 ENCOUNTER — Emergency Department (HOSPITAL_COMMUNITY)
Admission: EM | Admit: 2023-05-04 | Discharge: 2023-05-04 | Disposition: A | Discharge status: Home / Self Care | Payer: PPO | Attending: Emergency Medicine | Admitting: Emergency Medicine

## 2023-05-04 DIAGNOSIS — E119 Type 2 diabetes mellitus without complications: Secondary | ICD-10-CM | POA: Insufficient documentation

## 2023-05-04 DIAGNOSIS — I10 Essential (primary) hypertension: Secondary | ICD-10-CM | POA: Insufficient documentation

## 2023-05-04 DIAGNOSIS — R220 Localized swelling, mass and lump, head: Secondary | ICD-10-CM | POA: Insufficient documentation

## 2023-05-04 DIAGNOSIS — Z5900 Homelessness unspecified: Secondary | ICD-10-CM | POA: Insufficient documentation

## 2023-05-04 DIAGNOSIS — L02512 Cutaneous abscess of left hand: Secondary | ICD-10-CM | POA: Diagnosis not present

## 2023-05-04 DIAGNOSIS — Z7984 Long term (current) use of oral hypoglycemic drugs: Secondary | ICD-10-CM | POA: Insufficient documentation

## 2023-05-04 MED ORDER — AMOXICILLIN-POT CLAVULANATE 875-125 MG PO TABS
1.0000 | ORAL_TABLET | Freq: Once | ORAL | Status: AC
  Administered 2023-05-04: 1 via ORAL
  Filled 2023-05-04: qty 1

## 2023-05-04 NOTE — ED Notes (Signed)
Pt ambulatory without assistance.  

## 2023-05-04 NOTE — ED Provider Notes (Signed)
 [place] EMERGENCY DEPARTMENT AT [facility] Provider Note   CSN: [id number] Arrival date & time: 05/03/23  2220     History  Chief Complaint  Patient presents with   Hand Pain    Alejandro Baker is a 48 y.o. male.   Hand Pain   Patient returns stating his hand wound has not improved.  He reports it was just drained Wednesday.  Patient was also seen on Thursday for the same.  He is coming in for recheck.  Patient reports he has still not filled his antibiotics.  No fevers or chills.  Complains of continued pain.    Home Medications Prior to Admission medications   Medication Sig Start Date End Date Taking? Authorizing Provider  albuterol  (VENTOLIN  HFA) 108 (90 Base) MCG/ACT inhaler Inhale 1-2 puffs into the lungs every 6 (six) hours as needed for wheezing or shortness of breath. 05/19/21   Prosperi, Christian H, PA-C  amoxicillin -clavulanate (AUGMENTIN ) 875-125 MG tablet Take 1 tablet by mouth every 12 (twelve) hours. 05/02/23   Armenta Canning, MD  bacitracin  ointment Apply topically 2 times daily. 05/02/23   Armenta Canning, MD  benzonatate  (TESSALON ) 100 MG capsule Take 1 capsule (100 mg total) by mouth every 8 (eight) hours. 02/18/23   Smoot, Lauraine LABOR, PA-C  fluticasone  (FLONASE ) 50 MCG/ACT nasal spray Place 1 spray into both nostrils daily. 05/19/21   Prosperi, Christian H, PA-C  metFORMIN  (GLUCOPHAGE ) 500 MG tablet Take 1 tablet (500 mg total) by mouth 2 (two) times daily with a meal. 04/22/23   Raford Lenis, MD  naproxen  (NAPROSYN ) 500 MG tablet Take 1 tablet (500 mg total) by mouth 2 (two) times daily with a meal. 03/10/23   Rancour, Garnette, MD  ondansetron  (ZOFRAN ) 4 MG tablet Take 1 tablet (4 mg total) by mouth every 6 (six) hours as needed for nausea or vomiting. 04/09/23   Bernis Ernst, PA-C  risperiDONE  (RISPERDAL ) 2 MG tablet Take 1 tablet (2 mg total) by mouth at bedtime. 04/10/22 10/07/22  Marlee Lynwood NOVAK, MD      Allergies    Penicillins and Povidone-iodine     Review of Systems   Review of Systems  Physical Exam Updated Vital Signs BP (!) 129/95   Pulse (!) 117   Temp 98 F (36.7 C) (Oral)   Resp 16   SpO2 98%  Physical Exam Constitutional:      Comments: Patient is sleeping.  Disheveled.  Rouses to tactile stimulus.  Eyes:     Extraocular Movements: Extraocular movements intact.  Cardiovascular:     Rate and Rhythm: Normal rate and regular rhythm.  Pulmonary:     Effort: Pulmonary effort is normal.     Breath sounds: Normal breath sounds.  Musculoskeletal:     Comments: Recently incised erythematous indurated nodule on the left thumb base.  No significant erythema in the hand.  Normal range of motion of the thumb without any swelling of the thumb itself.  Abscess-like lesion with 2 eroded eschar's and a incision.  Nonfluctuant.  No significant change from imaging earlier today.     ED Results / Procedures / Treatments   Labs (all labs ordered are listed, but only abnormal results are displayed) Labs Reviewed - No data to display  EKG None  Radiology No results found.  Procedures Procedures    Medications Ordered in ED Medications  amoxicillin -clavulanate (AUGMENTIN ) 875-125 MG per tablet 1 tablet (1 tablet Oral Given 05/04/23 0102)    ED Course/ Medical Decision Making/  A&P                                 Medical Decision Making Risk Prescription drug management.   Patient presents for recheck of a wound that was incised Wednesday.  Patient continues to report he has not picked up his antibiotics.   Examination of the wound does not show any development of cellulitis of the hand or finger to suggest any associated tenosynovitis.  No active drainage.  No indication for further I&D at this time.  Cannot express any further material from the incision.  Patient previously prescribed Augmentin  and does state he can afford the $5 payment for the Augmentin .  No indication for admission or IV antibiotics at this time.   Patient is also homeless which complicates care.       Final Clinical Impression(s) / ED Diagnoses Final diagnoses:  Abscess    Rx / DC Orders ED Discharge Orders     None         Armenta Canning, MD 05/02/23 0906    Logan Ubaldo NOVAK, PA-C 05/04/23 0158    Carita Senior, MD 05/04/23 [phone]

## 2023-05-04 NOTE — ED Provider Notes (Signed)
 [place] EMERGENCY DEPARTMENT AT [facility] Provider Note   CSN: [id number] Arrival date & time: 05/04/23  0321     History  Chief Complaint  Patient presents with   Oral Swelling    Alejandro Baker is a 48 y.o. male patient with past medical history of hypertension, diabetes, homelessness, schizoaffective disorder, malingering presenting to emergency room with complaint of oral swelling.  Patient was just discharged earlier today for abscess on left hand.  Patient is well-known to emergency room with frequent visits.  When speaking to patient he denies any oral swelling any difficulty speaking or handling secretions.  Patient reports he is hungry and is requesting food and rest.  Denies any chest pain, shortness of breath, fever.  Patient still has not been able to pick up antibiotic however it is not time for a second of 12 months and today.  HPI     Home Medications Prior to Admission medications   Medication Sig Start Date End Date Taking? Authorizing Provider  albuterol  (VENTOLIN  HFA) 108 (90 Base) MCG/ACT inhaler Inhale 1-2 puffs into the lungs every 6 (six) hours as needed for wheezing or shortness of breath. 05/19/21   Prosperi, Christian H, PA-C  amoxicillin -clavulanate (AUGMENTIN ) 875-125 MG tablet Take 1 tablet by mouth every 12 (twelve) hours. 05/02/23   Armenta Canning, MD  bacitracin  ointment Apply topically 2 times daily. 05/02/23   Armenta Canning, MD  benzonatate  (TESSALON ) 100 MG capsule Take 1 capsule (100 mg total) by mouth every 8 (eight) hours. 02/18/23   Smoot, Lauraine LABOR, PA-C  fluticasone  (FLONASE ) 50 MCG/ACT nasal spray Place 1 spray into both nostrils daily. 05/19/21   Prosperi, Christian H, PA-C  metFORMIN  (GLUCOPHAGE ) 500 MG tablet Take 1 tablet (500 mg total) by mouth 2 (two) times daily with a meal. 04/22/23   Raford Lenis, MD  naproxen  (NAPROSYN ) 500 MG tablet Take 1 tablet (500 mg total) by mouth 2 (two) times daily with a meal. 03/10/23   Rancour,  Garnette, MD  ondansetron  (ZOFRAN ) 4 MG tablet Take 1 tablet (4 mg total) by mouth every 6 (six) hours as needed for nausea or vomiting. 04/09/23   Bernis Ernst, PA-C  risperiDONE  (RISPERDAL ) 2 MG tablet Take 1 tablet (2 mg total) by mouth at bedtime. 04/10/22 10/07/22  Marlee Lynwood NOVAK, MD      Allergies    Penicillins and Povidone-iodine    Review of Systems   Review of Systems  Constitutional:  Positive for appetite change.    Physical Exam Updated Vital Signs BP (!) 136/99 (BP Location: Left Arm)   Pulse 97   Temp 98 F (36.7 C) (Oral)   Resp 16   SpO2 100%  Physical Exam Vitals and nursing note reviewed.  Constitutional:      General: He is not in acute distress.    Appearance: He is not toxic-appearing.     Comments: Appears disheveled  HENT:     Head: Normocephalic and atraumatic.     Comments: No obvious area of swelling on exam. Patient has normal phonation and handling secretions.  Resting comfortably. Eyes:     General: No scleral icterus.    Conjunctiva/sclera: Conjunctivae normal.  Cardiovascular:     Rate and Rhythm: Normal rate and regular rhythm.     Pulses: Normal pulses.     Heart sounds: Normal heart sounds.  Pulmonary:     Effort: Pulmonary effort is normal. No respiratory distress.     Breath sounds: Normal breath sounds.  Abdominal:     General: Abdomen is flat. Bowel sounds are normal.     Palpations: Abdomen is soft.     Tenderness: There is no abdominal tenderness.  Musculoskeletal:     Right lower leg: No edema.     Left lower leg: No edema.  Skin:    General: Skin is warm and dry.     Findings: No lesion.  Neurological:     General: No focal deficit present.     Mental Status: He is alert and oriented to person, place, and time. Mental status is at baseline.     ED Results / Procedures / Treatments   Labs (all labs ordered are listed, but only abnormal results are displayed) Labs Reviewed - No data to  display  EKG None  Radiology No results found.  Procedures Procedures    Medications Ordered in ED Medications - No data to display  ED Course/ Medical Decision Making/ A&P                                 Medical Decision Making  This patient presents to the ED for concern of oral swelling, this involves an extensive number of treatment options, and is a complaint that carries with it a high risk of complications and morbidity.  The differential diagnosis includes dental infection, Ludwick's, strep throat, upper respiratory infection, abscess     Problem List / ED Course / Critical interventions / Medication management  Patient reporting to emergency room with chief complaint of oral swelling.  Upon speaking to patient he reports he does not have any oral swelling, his complaints are nonspecific he is resting comfortably in room.  Patient was seen here earlier today.  Patient is homeless with history of malingering.  Vitals are stable.  Patient tolerating p.o. intake.  Will discharge and encourage patient to pick up antibiotics.  I do not feel that further imaging or testing is necessary at this time.  I have reviewed the patients home medicines and have made adjustments as needed   Plan  F/u w/ PCP in 2-3d to ensure resolution of sx.  Patient was given return precautions. Patient stable for discharge at this time.  Patient educated on sx/dx and verbalized understanding of plan. Return to ER w/ new or worsening sx.          Final Clinical Impression(s) / ED Diagnoses Final diagnoses:  Homelessness    Rx / DC Orders ED Discharge Orders     None         Shermon Warren SAILOR, PA-C 05/04/23 0945    Dean Clarity, MD 05/04/23 [phone]

## 2023-05-04 NOTE — ED Triage Notes (Signed)
 PT reporting oral swelling but none noted.

## 2023-05-04 NOTE — Discharge Instructions (Addendum)
 Pick up Augmentin for hand infection.  Return with new or worsening symptoms.

## 2023-05-04 NOTE — Discharge Instructions (Signed)
 Please fill your previously prescribed antibiotics and take until complete to avoid further infection complications.  If you develop life-threatening symptoms return to the emergency department.

## 2023-05-05 ENCOUNTER — Other Ambulatory Visit: Payer: Self-pay

## 2023-05-05 ENCOUNTER — Emergency Department (HOSPITAL_COMMUNITY)
Admission: EM | Admit: 2023-05-05 | Discharge: 2023-05-06 | Disposition: A | Discharge status: Home / Self Care | Payer: PPO | Attending: Emergency Medicine | Admitting: Emergency Medicine

## 2023-05-05 ENCOUNTER — Encounter (HOSPITAL_COMMUNITY): Payer: Self-pay

## 2023-05-05 DIAGNOSIS — I10 Essential (primary) hypertension: Secondary | ICD-10-CM | POA: Insufficient documentation

## 2023-05-05 DIAGNOSIS — J45909 Unspecified asthma, uncomplicated: Secondary | ICD-10-CM | POA: Diagnosis not present

## 2023-05-05 DIAGNOSIS — Z5189 Encounter for other specified aftercare: Secondary | ICD-10-CM | POA: Insufficient documentation

## 2023-05-05 DIAGNOSIS — R059 Cough, unspecified: Secondary | ICD-10-CM | POA: Diagnosis present

## 2023-05-05 DIAGNOSIS — Z5321 Procedure and treatment not carried out due to patient leaving prior to being seen by health care provider: Secondary | ICD-10-CM | POA: Diagnosis not present

## 2023-05-05 DIAGNOSIS — E119 Type 2 diabetes mellitus without complications: Secondary | ICD-10-CM | POA: Diagnosis not present

## 2023-05-05 DIAGNOSIS — Z7951 Long term (current) use of inhaled steroids: Secondary | ICD-10-CM | POA: Insufficient documentation

## 2023-05-05 DIAGNOSIS — Z4801 Encounter for change or removal of surgical wound dressing: Secondary | ICD-10-CM | POA: Diagnosis not present

## 2023-05-05 DIAGNOSIS — Z7984 Long term (current) use of oral hypoglycemic drugs: Secondary | ICD-10-CM | POA: Insufficient documentation

## 2023-05-05 DIAGNOSIS — Z5900 Homelessness unspecified: Secondary | ICD-10-CM | POA: Diagnosis not present

## 2023-05-05 NOTE — ED Triage Notes (Signed)
 Pt. Alejandro Baker reporting hand pain from a scab to the L hand.

## 2023-05-06 ENCOUNTER — Emergency Department (HOSPITAL_COMMUNITY)
Admission: EM | Admit: 2023-05-06 | Discharge: 2023-05-06 | Discharge status: Against Medical Advice | Payer: PPO | Source: Home / Self Care

## 2023-05-06 DIAGNOSIS — R0902 Hypoxemia: Secondary | ICD-10-CM | POA: Diagnosis not present

## 2023-05-06 DIAGNOSIS — R059 Cough, unspecified: Secondary | ICD-10-CM | POA: Insufficient documentation

## 2023-05-06 DIAGNOSIS — Z5321 Procedure and treatment not carried out due to patient leaving prior to being seen by health care provider: Secondary | ICD-10-CM | POA: Insufficient documentation

## 2023-05-06 DIAGNOSIS — Z5189 Encounter for other specified aftercare: Secondary | ICD-10-CM | POA: Diagnosis not present

## 2023-05-06 NOTE — Progress Notes (Signed)
 Complex Care Management Note Care Guide Note  05/06/2023 Name: Alejandro Baker MRN: [id number] DOB: [birth year]   Complex Care Management Outreach Attempts: A third unsuccessful outreach was attempted today to offer the patient with information about available complex care management services.  Follow Up Plan:  No further outreach attempts will be made at this time. We have been unable to contact the patient to offer or enroll patient in complex care management services. Brother did not have a contact for patient.   Encounter Outcome:  No Answer  Harlene Satterfield  Care Coordination Care Guide  Direct Dial: [phone]

## 2023-05-06 NOTE — ED Notes (Signed)
 Pt stated he did not want to be seen and to get him out of here. Pt walked out to the lobby. RN notified.

## 2023-05-06 NOTE — Discharge Instructions (Addendum)
 Please follow-up with your PCP

## 2023-05-06 NOTE — ED Provider Notes (Signed)
 [place] [facility]ERGENCY DE[facility]MENT AT [facility] Provider Note   CSN: [id number] Arrival date & time: 05/05/23  2315     History  Chief Complaint  Patient presents with   Hand Pain    Alejandro Baker is a 48 y.o. male with medical history of Baker, Alejandro Baker, Alejandro Baker, Alejandro Baker, Alejandro Baker.  The patient has been seen in the ED for this same issue 9 times over the last 14 days.  Patient here today complaining of similar complaint.  Reports that his left hand is a scab from a previous abscess that he had incised and drained here in the ED.  Denies any fevers, nausea, vomiting at home.  Denies any swelling to his left hand.  After some discussion, patient reports that he is just here looking for a place to sleep.  Also requesting food.   Hand Pain       Home Medications Prior to Admission medications   Medication Sig Start Date End Date Taking? Authorizing Provider  albuterol  (VENTOLIN  HFA) 108 (90 Base) MCG/ACT inhaler Inhale 1-2 puffs into the lungs every 6 (six) hours as needed for wheezing or shortness of breath. 05/19/21   Prosperi, Christian H, PA-C  amoxicillin -clavulanate (AUGMENTIN ) 875-125 MG tablet Take 1 tablet by mouth every 12 (twelve) hours. 05/02/23   Armenta Canning, MD  bacitracin  ointment Apply topically 2 times daily. 05/02/23   Armenta Canning, MD  benzonatate  (TESSALON ) 100 MG capsule Take 1 capsule (100 mg total) by mouth every 8 (eight) hours. 02/18/23   Smoot, Lauraine LABOR, PA-C  fluticasone  (FLONASE ) 50 MCG/ACT nasal spray Place 1 spray into both nostrils daily. 05/19/21   Prosperi, Christian H, PA-C  metFORMIN  (GLUCOPHAGE ) 500 MG tablet Take 1 tablet (500 mg total) by mouth 2 (two) times daily with a meal. 04/22/23   Raford Lenis, MD  naproxen  (NAPROSYN ) 500 MG tablet Take 1 tablet (500 mg total) by mouth 2 (two) times daily with a meal. 03/10/23   Rancour, Garnette,  MD  ondansetron  (ZOFRAN ) 4 MG tablet Take 1 tablet (4 mg total) by mouth every 6 (six) hours as needed for nausea or vomiting. 04/09/23   Bernis Ernst, PA-C  risperiDONE  (RISPERDAL ) 2 MG tablet Take 1 tablet (2 mg total) by mouth at bedtime. 04/10/22 10/07/22  Marlee Lynwood NOVAK, MD      Allergies    Penicillins and Povidone-iodine    Review of Systems   Review of Systems  Musculoskeletal:  Positive for myalgias.  Skin:  Positive for wound.  All other systems reviewed and are negative.   Physical Exam Updated Vital Signs BP (!) 154/102 (BP Location: Right Arm)   Pulse (!) 106   Temp 98.6 F (37 C) (Oral)   Resp 18   Ht 5' 11 (1.803 m)   Wt 76.7 kg   SpO2 96%   BMI 23.57 kg/m  Physical Exam Vitals and nursing note reviewed.  Constitutional:      General: He is not in acute distress.    Appearance: He is well-developed.  HENT:     Head: Normocephalic and atraumatic.  Eyes:     Conjunctiva/sclera: Conjunctivae normal.  Cardiovascular:     Rate and Rhythm: Normal rate and regular rhythm.     Heart sounds: No murmur heard. Pulmonary:     Effort: Pulmonary effort is normal. No respiratory distress.     Breath sounds: Normal breath sounds.  Abdominal:  Palpations: Abdomen is soft.     Tenderness: There is no abdominal tenderness.  Musculoskeletal:        General: No swelling.     Cervical back: Neck supple.     Comments: Recently incised erythematous indurated nodule on the left thumb base.  No significant erythema in the hand.  Normal range of motion of the thumb without any swelling of the thumb itself. Nonfluctuant.    Skin:    General: Skin is warm and dry.     Capillary Refill: Capillary refill takes less than 2 seconds.  Neurological:     Mental Status: He is alert.  Psychiatric:        Mood and Affect: Mood normal.     ED Results / Procedures / Treatments   Labs (all labs ordered are listed, but only abnormal results are displayed) Labs Reviewed - No  data to display  EKG None  Radiology No results found.  Procedures Procedures   Medications Ordered in ED Medications - No data to display  ED Course/ Medical Decision Making/ A&P  Medical Decision Making  48 year old male presents for evaluation of injury to left hand.  Please see HPI for further details.  On examination, patient has a recently incised erythematous and indurated nodule in the left thumb base.  No significant erythema in the hand.  Normal range of motion of the thumb joint.  No swelling to the thumb itself, nonfluctuant is noted.  Patient wound appears to be well-healing.  On further questioning patient reports he is just looking for a place to sleep.  The patient was left to sleep in the ED for 4 hours, provided with food.  Patient to be discharged home at this time in stable condition.  Advised to follow-up with his PCP.   Final Clinical Impression(s) / ED Diagnoses Final diagnoses:  Wound check, abscess    Rx / DC Orders ED Discharge Orders     None         Ruthell Lonni FALCON, PA-C 05/06/23 0319    Melvenia Motto, MD 05/06/23 [phone]

## 2023-05-07 ENCOUNTER — Other Ambulatory Visit: Payer: Self-pay

## 2023-05-07 ENCOUNTER — Encounter (HOSPITAL_COMMUNITY): Payer: Self-pay

## 2023-05-07 ENCOUNTER — Emergency Department (HOSPITAL_COMMUNITY)
Admission: EM | Admit: 2023-05-07 | Discharge: 2023-05-07 | Disposition: A | Discharge status: Home / Self Care | Payer: PPO | Source: Home / Self Care | Attending: Emergency Medicine | Admitting: Emergency Medicine

## 2023-05-07 ENCOUNTER — Emergency Department (HOSPITAL_COMMUNITY)
Admission: EM | Admit: 2023-05-07 | Discharge: 2023-05-08 | Disposition: A | Discharge status: Home / Self Care | Payer: PPO | Source: Home / Self Care | Attending: Emergency Medicine | Admitting: Emergency Medicine

## 2023-05-07 ENCOUNTER — Emergency Department (HOSPITAL_COMMUNITY)
Admission: EM | Admit: 2023-05-07 | Discharge: 2023-05-07 | Discharge status: Against Medical Advice | Payer: PPO | Attending: Emergency Medicine | Admitting: Emergency Medicine

## 2023-05-07 ENCOUNTER — Emergency Department (HOSPITAL_COMMUNITY): Payer: PPO

## 2023-05-07 DIAGNOSIS — Z5321 Procedure and treatment not carried out due to patient leaving prior to being seen by health care provider: Secondary | ICD-10-CM | POA: Insufficient documentation

## 2023-05-07 DIAGNOSIS — J101 Influenza due to other identified influenza virus with other respiratory manifestations: Secondary | ICD-10-CM | POA: Insufficient documentation

## 2023-05-07 DIAGNOSIS — R0989 Other specified symptoms and signs involving the circulatory and respiratory systems: Secondary | ICD-10-CM | POA: Diagnosis not present

## 2023-05-07 DIAGNOSIS — R0602 Shortness of breath: Secondary | ICD-10-CM | POA: Diagnosis not present

## 2023-05-07 DIAGNOSIS — J029 Acute pharyngitis, unspecified: Secondary | ICD-10-CM | POA: Diagnosis not present

## 2023-05-07 DIAGNOSIS — Z20822 Contact with and (suspected) exposure to covid-19: Secondary | ICD-10-CM | POA: Diagnosis not present

## 2023-05-07 DIAGNOSIS — R070 Pain in throat: Secondary | ICD-10-CM | POA: Diagnosis not present

## 2023-05-07 DIAGNOSIS — J069 Acute upper respiratory infection, unspecified: Secondary | ICD-10-CM | POA: Insufficient documentation

## 2023-05-07 DIAGNOSIS — J111 Influenza due to unidentified influenza virus with other respiratory manifestations: Secondary | ICD-10-CM | POA: Insufficient documentation

## 2023-05-07 DIAGNOSIS — R059 Cough, unspecified: Secondary | ICD-10-CM | POA: Diagnosis not present

## 2023-05-07 LAB — GROUP A STREP BY PCR: Group A Strep by PCR: NOT DETECTED

## 2023-05-07 LAB — RESP PANEL BY RT-PCR (RSV, FLU A&B, COVID)  RVPGX2
Influenza A by PCR: POSITIVE — AB
Influenza B by PCR: NEGATIVE
Resp Syncytial Virus by PCR: NEGATIVE
SARS Coronavirus 2 by RT PCR: NEGATIVE

## 2023-05-07 MED ORDER — PROPOFOL 1000 MG/100ML IV EMUL
INTRAVENOUS | Status: AC
  Filled 2023-05-07: qty 100

## 2023-05-07 MED ORDER — PHENYLEPHRINE HCL-NACL 20-0.9 MG/250ML-% IV SOLN
INTRAVENOUS | Status: AC
  Filled 2023-05-07: qty 500

## 2023-05-07 NOTE — ED Provider Triage Note (Signed)
 Emergency Medicine Provider Triage Evaluation Note  Yasser Hepp , a 48 y.o. male  was evaluated in triage.  Pt complains of sore throat, shortness of breath.  Brought in by EMS, found [facility] [employer] [facility].  Review of Systems  Positive:  Negative:   Physical Exam  Ht 5' 11 (1.803 m)   Wt 76.7 kg   BMI 23.57 kg/m  Gen:   Awake, no distress   Resp:  Normal effort  MSK:   Moves extremities without difficulty  Other:  Lung sound clear  Medical Decision Making  Medically screening exam initiated at 2:43 AM.  Appropriate orders placed.  Yoan Sallade was informed that the remainder of the evaluation will be completed by another provider, this initial triage assessment does not replace that evaluation, and the importance of remaining in the ED until their evaluation is complete.     Ruthell Lonni FALCON, PA-C 05/07/23 [phone]

## 2023-05-07 NOTE — ED Triage Notes (Signed)
 Pt arrived via [facility] c/o sore throat. Refused vs from ems.

## 2023-05-07 NOTE — ED Triage Notes (Signed)
 Pt. Stated, I have a chest cold for a week.

## 2023-05-07 NOTE — Discharge Instructions (Signed)
 As discussed, your evaluation today has been largely reassuring.  But, it is important that you monitor your condition carefully, and do not hesitate to return to the ED if you develop new, or concerning changes in your condition. ? ?Otherwise, please follow-up with your physician for appropriate ongoing care. ? ?

## 2023-05-07 NOTE — ED Triage Notes (Signed)
 Patient reports chest congestion with occasional dry cough for 1 week .

## 2023-05-07 NOTE — ED Notes (Signed)
 Pt refused to finish answering triage questions and vitals. Continues to fall asleep. Sent back to lobby

## 2023-05-07 NOTE — ED Provider Notes (Signed)
 [place] EMERGENCY DEPARTMENT AT [facility] Provider Note   CSN: [id number] Arrival date & time: 05/07/23  9251     History  Chief Complaint  Patient presents with   Nasal Congestion    Alejandro Baker is a 48 y.o. male.  HPI Patient presents for the second time in the last 6 hours.  Patient withdrawn, answers questions when awakened, is in no distress, speaking clearly.  Patient reports having a cold for about 1 week.    Home Medications Prior to Admission medications   Medication Sig Start Date End Date Taking? Authorizing Provider  albuterol  (VENTOLIN  HFA) 108 (90 Base) MCG/ACT inhaler Inhale 1-2 puffs into the lungs every 6 (six) hours as needed for wheezing or shortness of breath. 05/19/21   Prosperi, Christian H, PA-C  amoxicillin -clavulanate (AUGMENTIN ) 875-125 MG tablet Take 1 tablet by mouth every 12 (twelve) hours. 05/02/23   Armenta Canning, MD  bacitracin  ointment Apply topically 2 times daily. 05/02/23   Armenta Canning, MD  benzonatate  (TESSALON ) 100 MG capsule Take 1 capsule (100 mg total) by mouth every 8 (eight) hours. 02/18/23   Smoot, Lauraine LABOR, PA-C  fluticasone  (FLONASE ) 50 MCG/ACT nasal spray Place 1 spray into both nostrils daily. 05/19/21   Prosperi, Christian H, PA-C  metFORMIN  (GLUCOPHAGE ) 500 MG tablet Take 1 tablet (500 mg total) by mouth 2 (two) times daily with a meal. 04/22/23   Raford Lenis, MD  naproxen  (NAPROSYN ) 500 MG tablet Take 1 tablet (500 mg total) by mouth 2 (two) times daily with a meal. 03/10/23   Rancour, Garnette, MD  ondansetron  (ZOFRAN ) 4 MG tablet Take 1 tablet (4 mg total) by mouth every 6 (six) hours as needed for nausea or vomiting. 04/09/23   Bernis Ernst, PA-C  risperiDONE  (RISPERDAL ) 2 MG tablet Take 1 tablet (2 mg total) by mouth at bedtime. 04/10/22 10/07/22  Marlee Lynwood NOVAK, MD      Allergies    Penicillins and Povidone-iodine    Review of Systems   Review of Systems  Physical Exam Updated Vital Signs There were no  vitals taken for this visit. Physical Exam Vitals and nursing note reviewed.  Constitutional:      General: He is not in acute distress.    Appearance: He is well-developed.     Comments: Disheveled adult male sleeping, awakens easily is in no distress interacting pleasantly  HENT:     Head: Normocephalic and atraumatic.  Eyes:     Conjunctiva/sclera: Conjunctivae normal.  Cardiovascular:     Rate and Rhythm: Normal rate and regular rhythm.  Pulmonary:     Effort: Pulmonary effort is normal. No respiratory distress.     Breath sounds: No stridor.  Abdominal:     General: There is no distension.  Skin:    General: Skin is warm and dry.  Neurological:     Mental Status: He is oriented to person, place, and time.     ED Results / Procedures / Treatments   Labs (all labs ordered are listed, but only abnormal results are displayed) Labs Reviewed - No data to display  EKG None  Radiology DG Chest 2 View Result Date: 05/07/2023 CLINICAL DATA:  Shortness of breath EXAM: CHEST - 2 VIEW COMPARISON:  03/22/2023 FINDINGS: Low lung volumes. Normal cardiomediastinal silhouette. No focal consolidation, pleural effusion, or pneumothorax. No displaced rib fractures. IMPRESSION: No active cardiopulmonary disease. Electronically Signed   By: Norman Gatlin M.D.   On: 05/07/2023 03:01    Procedures Procedures  Medications Ordered in ED Medications - No data to display  ED Course/ Medical Decision Making/ A&P                                 Medical Decision Making Adult male with multiple issues and social General Including homelessness presents with cough.  He is awake and alert, afebrile, has been seen, evaluated within the past 8 hours, with reassuring vitals, exam.  Given his history of multiple ED visits, absence of acute new pathology, reassuring physical exam, patient appropriate for discharge with outpatient follow-up.  Amount and/or Complexity of Data Reviewed External Data  Reviewed: notes.  Risk Diagnosis or treatment significantly limited by social determinants of health.  Final Clinical Impression(s) / ED Diagnoses Final diagnoses:  Upper respiratory tract infection, unspecified type    Rx / DC Orders ED Discharge Orders     None         Garrick Charleston, MD 05/07/23 [phone]

## 2023-05-08 NOTE — ED Provider Notes (Signed)
 [place] EMERGENCY DEPARTMENT AT [facility] Provider Note   CSN: [id number] Arrival date & time: 05/07/23  1942     History  Chief Complaint  Patient presents with   Cough/Chest Congestion     Alejandro Baker is a 48 y.o. male.  Patient presents with recurrent cough congestion for 1 week.  Patient had x-ray and viral testing recently.  Patient says symptoms are similar mild sore throat with coughing.  Subjective fevers.  No shortness of breath.  No lung disease history.  The history is provided by the patient and medical records.       Home Medications Prior to Admission medications   Medication Sig Start Date End Date Taking? Authorizing Provider  albuterol  (VENTOLIN  HFA) 108 (90 Base) MCG/ACT inhaler Inhale 1-2 puffs into the lungs every 6 (six) hours as needed for wheezing or shortness of breath. 05/19/21   Prosperi, Christian H, PA-C  amoxicillin -clavulanate (AUGMENTIN ) 875-125 MG tablet Take 1 tablet by mouth every 12 (twelve) hours. 05/02/23   Armenta Canning, MD  bacitracin  ointment Apply topically 2 times daily. 05/02/23   Armenta Canning, MD  benzonatate  (TESSALON ) 100 MG capsule Take 1 capsule (100 mg total) by mouth every 8 (eight) hours. 02/18/23   Smoot, Sarah A, PA-C  fluticasone  (FLONASE ) 50 MCG/ACT nasal spray Place 1 spray into both nostrils daily. 05/19/21   Prosperi, Christian H, PA-C  metFORMIN  (GLUCOPHAGE ) 500 MG tablet Take 1 tablet (500 mg total) by mouth 2 (two) times daily with a meal. 04/22/23   Raford Lenis, MD  naproxen  (NAPROSYN ) 500 MG tablet Take 1 tablet (500 mg total) by mouth 2 (two) times daily with a meal. 03/10/23   Rancour, Garnette, MD  ondansetron  (ZOFRAN ) 4 MG tablet Take 1 tablet (4 mg total) by mouth every 6 (six) hours as needed for nausea or vomiting. 04/09/23   Bernis Ernst, PA-C  risperiDONE  (RISPERDAL ) 2 MG tablet Take 1 tablet (2 mg total) by mouth at bedtime. 04/10/22 10/07/22  Marlee Lynwood NOVAK, MD      Allergies     Penicillins and Povidone-iodine    Review of Systems   Review of Systems  Constitutional:  Negative for chills and fever.  HENT:  Positive for congestion.   Eyes:  Negative for visual disturbance.  Respiratory:  Positive for cough. Negative for shortness of breath.   Cardiovascular:  Negative for chest pain.  Gastrointestinal:  Negative for abdominal pain and vomiting.  Genitourinary:  Negative for dysuria and flank pain.  Musculoskeletal:  Negative for back pain, neck pain and neck stiffness.  Skin:  Negative for rash.  Neurological:  Negative for light-headedness and headaches.    Physical Exam Updated Vital Signs BP 120/89   Pulse 100   Temp 98.3 F (36.8 C)   Resp 17   SpO2 98%  Physical Exam Vitals and nursing note reviewed.  Constitutional:      General: He is not in acute distress.    Appearance: He is well-developed.  HENT:     Head: Normocephalic.     Nose: Congestion present.     Mouth/Throat:     Mouth: Mucous membranes are moist.  Eyes:     General:        Right eye: No discharge.        Left eye: No discharge.     Conjunctiva/sclera: Conjunctivae normal.  Neck:     Trachea: No tracheal deviation.  Cardiovascular:     Rate and Rhythm: Normal rate and  regular rhythm.  Pulmonary:     Effort: Pulmonary effort is normal.     Breath sounds: Normal breath sounds.  Abdominal:     General: There is no distension.     Palpations: Abdomen is soft.     Tenderness: There is no abdominal tenderness. There is no guarding.  Musculoskeletal:        General: Normal range of motion.     Cervical back: Normal range of motion and neck supple. No rigidity.  Skin:    General: Skin is warm.     Capillary Refill: Capillary refill takes less than 2 seconds.     Findings: No rash.  Neurological:     General: No focal deficit present.     Mental Status: He is alert.     Cranial Nerves: No cranial nerve deficit.  Psychiatric:     Comments: Flat affect, cooperative,  answers questions appropriately.     ED Results / Procedures / Treatments   Labs (all labs ordered are listed, but only abnormal results are displayed) Labs Reviewed - No data to display  EKG None  Radiology DG Chest 2 View Result Date: 05/07/2023 CLINICAL DATA:  Shortness of breath EXAM: CHEST - 2 VIEW COMPARISON:  03/22/2023 FINDINGS: Low lung volumes. Normal cardiomediastinal silhouette. No focal consolidation, pleural effusion, or pneumothorax. No displaced rib fractures. IMPRESSION: No active cardiopulmonary disease. Electronically Signed   By: Norman Gatlin M.D.   On: 05/07/2023 03:01    Procedures Procedures    Medications Ordered in ED Medications - No data to display  ED Course/ Medical Decision Making/ A&P                                 Medical Decision Making  Patient presents with recurrent cough congestion.  On exam lungs overall clear, normal work of breathing, normal oxygenation.  Recent chest x-ray from yesterday reviewed no acute infiltrate, viral testing recently showed influenza positive.  Patient has no signs significant dehydration or requiring oxygen or IV fluid or blood work.  Discussed supportive care patient said he is trying to get a hold of his brother for a place to stay.   Patient stable for discharge.        Final Clinical Impression(s) / ED Diagnoses Final diagnoses:  Influenza  Cough in adult    Rx / DC Orders ED Discharge Orders     None         Tonia Chew, MD 05/08/23 0830

## 2023-05-08 NOTE — ED Notes (Signed)
 ED Provider at bedside.

## 2023-05-08 NOTE — Discharge Instructions (Addendum)
 Use Tylenol  every 4 hours and ibuprofen  every 6 hours needed for pain or fever. Use honey as needed for cough. Your recent chest x-ray was negative and your recent viral testing showed you have the flu so antibiotics will not help you. Return for breathing difficulty or new concerns.

## 2023-05-09 ENCOUNTER — Other Ambulatory Visit: Payer: Self-pay

## 2023-05-09 ENCOUNTER — Encounter (HOSPITAL_COMMUNITY): Payer: Self-pay

## 2023-05-09 ENCOUNTER — Emergency Department (HOSPITAL_COMMUNITY)
Admission: EM | Admit: 2023-05-09 | Discharge: 2023-05-09 | Discharge status: Against Medical Advice | Payer: PPO | Attending: Emergency Medicine | Admitting: Emergency Medicine

## 2023-05-09 DIAGNOSIS — Z20822 Contact with and (suspected) exposure to covid-19: Secondary | ICD-10-CM | POA: Diagnosis not present

## 2023-05-09 DIAGNOSIS — J101 Influenza due to other identified influenza virus with other respiratory manifestations: Secondary | ICD-10-CM | POA: Diagnosis not present

## 2023-05-09 DIAGNOSIS — Z5321 Procedure and treatment not carried out due to patient leaving prior to being seen by health care provider: Secondary | ICD-10-CM | POA: Diagnosis not present

## 2023-05-09 DIAGNOSIS — R059 Cough, unspecified: Secondary | ICD-10-CM | POA: Diagnosis not present

## 2023-05-09 DIAGNOSIS — R1111 Vomiting without nausea: Secondary | ICD-10-CM | POA: Diagnosis not present

## 2023-05-09 LAB — RESP PANEL BY RT-PCR (RSV, FLU A&B, COVID)  RVPGX2
Influenza A by PCR: POSITIVE — AB
Influenza B by PCR: NEGATIVE
Resp Syncytial Virus by PCR: NEGATIVE
SARS Coronavirus 2 by RT PCR: NEGATIVE

## 2023-05-09 MED ORDER — ACETAMINOPHEN 325 MG PO TABS
650.0000 mg | ORAL_TABLET | Freq: Once | ORAL | Status: AC
  Administered 2023-05-09: 650 mg via ORAL

## 2023-05-09 MED ORDER — ACETAMINOPHEN 325 MG PO TABS
ORAL_TABLET | ORAL | Status: AC
  Filled 2023-05-09: qty 1

## 2023-05-09 NOTE — ED Triage Notes (Signed)
 Pt BIB [facility] for cough and congestion x 1-2 days.

## 2023-05-11 ENCOUNTER — Emergency Department (HOSPITAL_COMMUNITY)
Admission: EM | Admit: 2023-05-11 | Discharge: 2023-05-12 | Disposition: A | Discharge status: Home / Self Care | Payer: PPO | Attending: Emergency Medicine | Admitting: Emergency Medicine

## 2023-05-11 ENCOUNTER — Encounter (HOSPITAL_COMMUNITY): Payer: Self-pay | Admitting: *Deleted

## 2023-05-11 ENCOUNTER — Other Ambulatory Visit: Payer: Self-pay

## 2023-05-11 ENCOUNTER — Emergency Department (HOSPITAL_COMMUNITY): Payer: PPO

## 2023-05-11 DIAGNOSIS — E119 Type 2 diabetes mellitus without complications: Secondary | ICD-10-CM | POA: Insufficient documentation

## 2023-05-11 DIAGNOSIS — Z7984 Long term (current) use of oral hypoglycemic drugs: Secondary | ICD-10-CM | POA: Diagnosis not present

## 2023-05-11 DIAGNOSIS — S0003XA Contusion of scalp, initial encounter: Secondary | ICD-10-CM | POA: Diagnosis not present

## 2023-05-11 DIAGNOSIS — W01198A Fall on same level from slipping, tripping and stumbling with subsequent striking against other object, initial encounter: Secondary | ICD-10-CM | POA: Insufficient documentation

## 2023-05-11 DIAGNOSIS — I10 Essential (primary) hypertension: Secondary | ICD-10-CM | POA: Diagnosis not present

## 2023-05-11 DIAGNOSIS — M79642 Pain in left hand: Secondary | ICD-10-CM | POA: Diagnosis not present

## 2023-05-11 DIAGNOSIS — R059 Cough, unspecified: Secondary | ICD-10-CM | POA: Diagnosis not present

## 2023-05-11 DIAGNOSIS — S0990XA Unspecified injury of head, initial encounter: Secondary | ICD-10-CM

## 2023-05-11 DIAGNOSIS — Z765 Malingerer [conscious simulation]: Secondary | ICD-10-CM | POA: Diagnosis not present

## 2023-05-11 DIAGNOSIS — Z5900 Homelessness unspecified: Secondary | ICD-10-CM | POA: Diagnosis not present

## 2023-05-11 DIAGNOSIS — R519 Headache, unspecified: Secondary | ICD-10-CM | POA: Diagnosis not present

## 2023-05-11 DIAGNOSIS — Y9301 Activity, walking, marching and hiking: Secondary | ICD-10-CM | POA: Diagnosis not present

## 2023-05-11 DIAGNOSIS — W19XXXA Unspecified fall, initial encounter: Secondary | ICD-10-CM | POA: Diagnosis not present

## 2023-05-11 DIAGNOSIS — J45909 Unspecified asthma, uncomplicated: Secondary | ICD-10-CM | POA: Diagnosis not present

## 2023-05-11 MED ORDER — ACETAMINOPHEN 500 MG PO TABS
1000.0000 mg | ORAL_TABLET | Freq: Once | ORAL | Status: DC
Start: 2023-05-11 — End: 2023-05-12
  Filled 2023-05-11: qty 2

## 2023-05-11 NOTE — ED Triage Notes (Addendum)
 Pt here via [facility] after falling.  He states he was walking across the snow in from of [facility] and he slipped on something round and hit his head.  Denies loc.  C/o occipital pain.  Denies neck pain.  No hematoma or bleeding noted.  Pt ao x 4. Cbg 300

## 2023-05-11 NOTE — ED Provider Triage Note (Addendum)
 Emergency Medicine Provider Triage Evaluation Note  Alejandro Baker , a 48 y.o. male  was evaluated in triage.  Pt complains of occipital headache s/p fall. States he slipped on something in the snow striking the back of his head. Denies LOC, but experienced lightheadedness. No vomiting. No meds PTA.  Review of Systems  Positive: As above Negative: As above  Physical Exam  BP 134/84 (BP Location: Left Arm)   Pulse 84   Temp 98.4 F (36.9 C) (Oral)   Resp 20   Ht 5' 11 (1.803 m)   Wt 76.4 kg   SpO2 99%   BMI 23.48 kg/m  Gen:   Awake, no distress   Resp:  Normal effort  MSK:   Moves extremities without difficulty  Other:  Hematoma to posterior scalp. No laceration. No focal neurologic deficits noted. Fully conversant.   Medical Decision Making  Medically screening exam initiated at 10:30 PM.  Appropriate orders placed.  Alejandro Baker was informed that the remainder of the evaluation will be completed by another provider, this initial triage assessment does not replace that evaluation, and the importance of remaining in the ED until their evaluation is complete.  Head injury - CT pending.   Keith Sor, PA-C 05/11/23 2232    Keith Sor, PA-C 05/12/23 0040

## 2023-05-12 ENCOUNTER — Encounter (HOSPITAL_COMMUNITY): Payer: Self-pay

## 2023-05-12 ENCOUNTER — Emergency Department (HOSPITAL_COMMUNITY)
Admission: EM | Admit: 2023-05-12 | Discharge: 2023-05-12 | Disposition: A | Discharge status: Home / Self Care | Payer: PPO | Source: Home / Self Care | Attending: Emergency Medicine | Admitting: Emergency Medicine

## 2023-05-12 ENCOUNTER — Other Ambulatory Visit: Payer: Self-pay

## 2023-05-12 ENCOUNTER — Emergency Department (HOSPITAL_COMMUNITY)
Admission: EM | Admit: 2023-05-12 | Discharge: 2023-05-12 | Disposition: A | Discharge status: Home / Self Care | Payer: PPO | Attending: Emergency Medicine | Admitting: Emergency Medicine

## 2023-05-12 DIAGNOSIS — Z5900 Homelessness unspecified: Secondary | ICD-10-CM | POA: Insufficient documentation

## 2023-05-12 DIAGNOSIS — Z765 Malingerer [conscious simulation]: Secondary | ICD-10-CM | POA: Insufficient documentation

## 2023-05-12 DIAGNOSIS — I10 Essential (primary) hypertension: Secondary | ICD-10-CM | POA: Insufficient documentation

## 2023-05-12 DIAGNOSIS — W19XXXA Unspecified fall, initial encounter: Secondary | ICD-10-CM | POA: Insufficient documentation

## 2023-05-12 DIAGNOSIS — E119 Type 2 diabetes mellitus without complications: Secondary | ICD-10-CM | POA: Insufficient documentation

## 2023-05-12 DIAGNOSIS — S0003XA Contusion of scalp, initial encounter: Secondary | ICD-10-CM | POA: Diagnosis not present

## 2023-05-12 DIAGNOSIS — Z7984 Long term (current) use of oral hypoglycemic drugs: Secondary | ICD-10-CM | POA: Insufficient documentation

## 2023-05-12 DIAGNOSIS — J45909 Unspecified asthma, uncomplicated: Secondary | ICD-10-CM | POA: Insufficient documentation

## 2023-05-12 DIAGNOSIS — M79642 Pain in left hand: Secondary | ICD-10-CM | POA: Insufficient documentation

## 2023-05-12 DIAGNOSIS — R052 Subacute cough: Secondary | ICD-10-CM

## 2023-05-12 DIAGNOSIS — R059 Cough, unspecified: Secondary | ICD-10-CM | POA: Insufficient documentation

## 2023-05-12 MED ORDER — NAPROXEN 250 MG PO TABS
500.0000 mg | ORAL_TABLET | Freq: Once | ORAL | Status: AC
  Administered 2023-05-12: 500 mg via ORAL
  Filled 2023-05-12: qty 2

## 2023-05-12 MED ORDER — ACETAMINOPHEN 325 MG PO TABS
ORAL_TABLET | ORAL | Status: AC
  Filled 2023-05-12: qty 1

## 2023-05-12 MED ORDER — IBUPROFEN 400 MG PO TABS
600.0000 mg | ORAL_TABLET | Freq: Once | ORAL | Status: AC
  Administered 2023-05-12: 600 mg via ORAL
  Filled 2023-05-12: qty 1

## 2023-05-12 MED ORDER — ACETAMINOPHEN 325 MG PO TABS
975.0000 mg | ORAL_TABLET | Freq: Once | ORAL | Status: AC
  Administered 2023-05-12: 975 mg via ORAL
  Filled 2023-05-12: qty 3

## 2023-05-12 NOTE — Discharge Instructions (Addendum)
 Take tylenol or Aleve for management of pain or headache. Follow up with a primary care doctor for recheck, if desired.

## 2023-05-12 NOTE — ED Provider Notes (Signed)
 [name] Provider Note   CSN: [id number] Arrival date & time: 05/12/23  1834     History  Chief Complaint  Patient presents with   [place]    Zander Baker is a 48 y.o. male.  With a history of homelessnessWho presents for hand pain.  This patient is well-known to our department and has been seen in the emergency department for various complaints 7 different times since January 1.  Most recently he was seen earlier this morning after fall and just after midnight directly before then and discharged on both occasions.  He returns complaining of left hand pain.  Had a recent incision and drainage of left hand abscess which is causing more discomfort.  No new abscess fevers chills or injuries.  Denies HI SI.  No other complaints at this time   Fall       Home Medications Prior to Admission medications   Medication Sig Start Date End Date Taking? Authorizing Provider  albuterol  (VENTOLIN  HFA) 108 (90 Base) MCG/ACT inhaler Inhale 1-2 puffs into the lungs every 6 (six) hours as needed for wheezing or shortness of breath. 05/19/21   Prosperi, Christian H, PA-C  amoxicillin -clavulanate (AUGMENTIN ) 875-125 MG tablet Take 1 tablet by mouth every 12 (twelve) hours. 05/02/23   Armenta Canning, MD  bacitracin  ointment Apply topically 2 times daily. 05/02/23   Armenta Canning, MD  benzonatate  (TESSALON ) 100 MG capsule Take 1 capsule (100 mg total) by mouth every 8 (eight) hours. 02/18/23   Smoot, Lauraine LABOR, PA-C  fluticasone  (FLONASE ) 50 MCG/ACT nasal spray Place 1 spray into both nostrils daily. 05/19/21   Prosperi, Christian H, PA-C  metFORMIN  (GLUCOPHAGE ) 500 MG tablet Take 1 tablet (500 mg total) by mouth 2 (two) times daily with a meal. 04/22/23   Raford Lenis, MD  naproxen  (NAPROSYN ) 500 MG tablet Take 1 tablet (500 mg total) by mouth 2 (two) times daily with a meal. 03/10/23   Rancour, Garnette, MD  ondansetron  (ZOFRAN ) 4 MG tablet Take 1 tablet (4 mg  total) by mouth every 6 (six) hours as needed for nausea or vomiting. 04/09/23   Bernis Ernst, PA-C  risperiDONE  (RISPERDAL ) 2 MG tablet Take 1 tablet (2 mg total) by mouth at bedtime. 04/10/22 10/07/22  Marlee Lynwood NOVAK, MD      Allergies    Penicillins and Povidone-iodine    Review of Systems   Review of Systems  Physical Exam Updated Vital Signs BP 133/88 (BP Location: Right Arm)   Pulse 79   Temp 98.8 F (37.1 C) (Oral)   Resp 18   Ht 5' 11 (1.803 m)   Wt 88.9 kg   SpO2 97%   BMI 27.34 kg/m  Physical Exam Vitals and nursing note reviewed.  HENT:     Head: Normocephalic and atraumatic.  Eyes:     Pupils: Pupils are equal, round, and reactive to light.  Cardiovascular:     Rate and Rhythm: Normal rate and regular rhythm.  Pulmonary:     Effort: Pulmonary effort is normal.     Breath sounds: Normal breath sounds.  Abdominal:     Palpations: Abdomen is soft.     Tenderness: There is no abdominal tenderness.  Skin:    General: Skin is warm and dry.     Comments: Small circular erythema over dorsal aspect of left thumb without significant tenderness fluctuance or drainage with 2 overlying abrasions No increased warmth  Neurological:     Mental  Status: He is alert.  Psychiatric:        Mood and Affect: Mood normal.     ED Results / Procedures / Treatments   Labs (all labs ordered are listed, but only abnormal results are displayed) Labs Reviewed - No data to display  EKG None  Radiology CT HEAD WO CONTRAST ( ) Result Date: 05/11/2023 CLINICAL DATA:  Head trauma, moderate-severe He states he was walking across the snow in from of [facility] and he slipped on something round and hit his head. Denies loc. C/o occipital pain EXAM: CT HEAD WITHOUT CONTRAST TECHNIQUE: Contiguous axial images were obtained from the base of the skull through the vertex without intravenous contrast. RADIATION DOSE REDUCTION: This exam was performed according to the departmental  dose-optimization program which includes automated exposure control, adjustment of the mA and/or kV according to patient size and/or use of iterative reconstruction technique. COMPARISON:  None Available. FINDINGS: Brain: No evidence of large-territorial acute infarction. No parenchymal hemorrhage. No mass lesion. No extra-axial collection. No mass effect or midline shift. No hydrocephalus. Basilar cisterns are patent. Vascular: No hyperdense vessel. Skull: No acute fracture or focal lesion. Sinuses/Orbits: Mild mucosal thickening of the bilateral sphenoid and ethmoid sinuses. Otherwise paranasal sinuses and mastoid air cells are clear. The orbits are unremarkable. Other: None. IMPRESSION: No acute intracranial abnormality. Electronically Signed   By: Morgane  Naveau M.D.   On: 05/11/2023 22:49    Procedures Procedures    Medications Ordered in ED Medications  acetaminophen  (TYLENOL ) tablet 975 mg (975 mg Oral Given 05/12/23 2054)  ibuprofen  (ADVIL ) tablet 600 mg (600 mg Oral Given 05/12/23 2051)    ED Course/ Medical Decision Making/ A&P                                 Medical Decision Making 48 year old male who is undomiciled and well-known to the department returns for left hand pain.  No new injuries.  Did have recent left hand incision and drainage for abscess.  No fevers.  Benign physical exam.  No need for imaging at this time.  Will provide Tylenol  Motrin  and discharge.  Return precautions were discussed  Risk OTC drugs.           Final Clinical Impression(s) / ED Diagnoses Final diagnoses:  Left hand pain    Rx / DC Orders ED Discharge Orders     None         [name] LABOR, DO 05/12/23 2056

## 2023-05-12 NOTE — ED Triage Notes (Addendum)
 Pt presents with head injury from a fall that happened last night. Denies LOC or vomiting. Pt was seen last PM after the fall and was d/c'd. Pt was also seen again this AM for the same and d/c'd.   Pt also requesting to have L hand wound checked that has localized redness.

## 2023-05-12 NOTE — ED Provider Notes (Signed)
 [place] EMERGENCY DEPARTMENT AT [facility] Provider Note   CSN: [id number] Arrival date & time: 05/12/23  9380     History  Chief Complaint  Patient presents with   Cough    Alejandro Baker is a 48 y.o. male.  HPI     This is a 48 year old male well-known to our emergency department who presents with cough.  Recent positive influenza diagnosis.  Patient has been seen and evaluated innumerable times including 2-3 times per day over the last 1 week.  He was last seen last night after reported fall.  He was discharged home and states that he was brought back but I do not know why.  He does not have a place to stay and is currently homeless.  Suspect cold temperatures outside are dictating his emergency room visits.  Patient does not further contribute to history taking.  Home Medications Prior to Admission medications   Medication Sig Start Date End Date Taking? Authorizing Provider  albuterol  (VENTOLIN  HFA) 108 (90 Base) MCG/ACT inhaler Inhale 1-2 puffs into the lungs every 6 (six) hours as needed for wheezing or shortness of breath. 05/19/21   Prosperi, Christian H, PA-C  amoxicillin -clavulanate (AUGMENTIN ) 875-125 MG tablet Take 1 tablet by mouth every 12 (twelve) hours. 05/02/23   Armenta Canning, MD  bacitracin  ointment Apply topically 2 times daily. 05/02/23   Armenta Canning, MD  benzonatate  (TESSALON ) 100 MG capsule Take 1 capsule (100 mg total) by mouth every 8 (eight) hours. 02/18/23   Smoot, Sarah A, PA-C  fluticasone  (FLONASE ) 50 MCG/ACT nasal spray Place 1 spray into both nostrils daily. 05/19/21   Prosperi, Christian H, PA-C  metFORMIN  (GLUCOPHAGE ) 500 MG tablet Take 1 tablet (500 mg total) by mouth 2 (two) times daily with a meal. 04/22/23   Raford Lenis, MD  naproxen  (NAPROSYN ) 500 MG tablet Take 1 tablet (500 mg total) by mouth 2 (two) times daily with a meal. 03/10/23   Rancour, Garnette, MD  ondansetron  (ZOFRAN ) 4 MG tablet Take 1 tablet (4 mg total) by mouth  every 6 (six) hours as needed for nausea or vomiting. 04/09/23   Bernis Ernst, PA-C  risperiDONE  (RISPERDAL ) 2 MG tablet Take 1 tablet (2 mg total) by mouth at bedtime. 04/10/22 10/07/22  Marlee Lynwood NOVAK, MD      Allergies    Penicillins and Povidone-iodine    Review of Systems   Review of Systems  Constitutional:  Negative for fever.  Respiratory:  Positive for cough. Negative for shortness of breath.   Cardiovascular:  Negative for chest pain.  All other systems reviewed and are negative.   Physical Exam Updated Vital Signs There were no vitals taken for this visit. Physical Exam Vitals and nursing note reviewed.  Constitutional:      Appearance: He is well-developed.     Comments: Disheveled, will not make eye contact  HENT:     Head: Normocephalic and atraumatic.  Eyes:     Pupils: Pupils are equal, round, and reactive to light.  Cardiovascular:     Rate and Rhythm: Normal rate and regular rhythm.  Pulmonary:     Effort: Pulmonary effort is normal. No respiratory distress.     Breath sounds: Normal breath sounds. No wheezing or rhonchi.  Abdominal:     Palpations: Abdomen is soft.     Tenderness: There is no abdominal tenderness.  Musculoskeletal:     Cervical back: Neck supple.  Lymphadenopathy:     Cervical: No cervical adenopathy.  Skin:  General: Skin is warm and dry.  Neurological:     Mental Status: He is alert and oriented to person, place, and time.  Psychiatric:        Mood and Affect: Mood normal.     ED Results / Procedures / Treatments   Labs (all labs ordered are listed, but only abnormal results are displayed) Labs Reviewed - No data to display  EKG None  Radiology CT HEAD WO CONTRAST ( ) Result Date: 05/11/2023 CLINICAL DATA:  Head trauma, moderate-severe He states he was walking across the snow in from of [facility] and he slipped on something round and hit his head. Denies loc. C/o occipital pain EXAM: CT HEAD WITHOUT CONTRAST  TECHNIQUE: Contiguous axial images were obtained from the base of the skull through the vertex without intravenous contrast. RADIATION DOSE REDUCTION: This exam was performed according to the departmental dose-optimization program which includes automated exposure control, adjustment of the mA and/or kV according to patient size and/or use of iterative reconstruction technique. COMPARISON:  None Available. FINDINGS: Brain: No evidence of large-territorial acute infarction. No parenchymal hemorrhage. No mass lesion. No extra-axial collection. No mass effect or midline shift. No hydrocephalus. Basilar cisterns are patent. Vascular: No hyperdense vessel. Skull: No acute fracture or focal lesion. Sinuses/Orbits: Mild mucosal thickening of the bilateral sphenoid and ethmoid sinuses. Otherwise paranasal sinuses and mastoid air cells are clear. The orbits are unremarkable. Other: None. IMPRESSION: No acute intracranial abnormality. Electronically Signed   By: Morgane  Naveau M.D.   On: 05/11/2023 22:49    Procedures Procedures    Medications Ordered in ED Medications - No data to display  ED Course/ Medical Decision Making/ A&P                                 Medical Decision Making  This patient presents to the ED for concern of cough, this involves an extensive number of treatment options, and is a complaint that carries with it a high risk of complications and morbidity.  I considered the following differential and admission for this acute, potentially life threatening condition.  The differential diagnosis includes ongoing bronchitis, viral illness, pneumonia, malingering  MDM:    This is a 48 year old male well-known to our emergency department who presents with reported cough.  Highly suspect malingering given his innumerable ED visits especially over the last week.  It is very cold outside.  He is nontoxic.  Vital signs are reassuring.  He is in no respiratory distress.  No significant pulmonary  findings.  At this time I do not feel he requires further workup.  (Labs, imaging, consults)  Labs: I Ordered, and personally interpreted labs.  The pertinent results include: N/A  Imaging Studies ordered: I ordered imaging studies including N/A I independently visualized and interpreted imaging. I agree with the radiologist interpretation  Additional history obtained from chart review.  External records from outside source obtained and reviewed including prior evaluations  Cardiac Monitoring: The patient was not maintained on a cardiac monitor.  If on the cardiac monitor, I personally viewed and interpreted the cardiac monitored which showed an underlying rhythm of: N/A  Reevaluation: After the interventions noted above, I reevaluated the patient and found that they have :stayed the same  Social Determinants of Health:  homeless  Disposition: Discharge  Co morbidities that complicate the patient evaluation  Past Medical History:  Diagnosis Date   Asthma    DM (  diabetes mellitus) (HCC)    Homeless    Hypertension    Neuropathy    Schizoaffective disorder, bipolar type (HCC)      Medicines No orders of the defined types were placed in this encounter.   I have reviewed the patients home medicines and have made adjustments as needed  Problem List / ED Course: Problem List Items Addressed This Visit       Other   Malingering - Primary                Final Clinical Impression(s) / ED Diagnoses Final diagnoses:  Malingering    Rx / DC Orders ED Discharge Orders     None         Bari Charmaine FALCON, MD 05/12/23 [phone]

## 2023-05-12 NOTE — ED Triage Notes (Signed)
 Pt presents via POV c/o cough x1 week.   Pt seen here yesterday for fall and evaluated in ED and d/c  Pt is well known to this ED with 57 ED visits in the last 6 months and is homeless.   Pt calm and cooperative at this time. Ambulatory to triage in no acute distress.

## 2023-05-12 NOTE — ED Provider Notes (Signed)
 [place] EMERGENCY DEPARTMENT AT [facility] Provider Note   CSN: [id number] Arrival date & time: 05/11/23  2132     History  Chief Complaint  Patient presents with   [place]    Alejandro Baker is a 48 y.o. male.  48 y/o male with hx of HTN, DM, schizoaffective disorder, homelessness presents to the ED for complaints of occipital headache s/p fall. States he slipped on something in the snow striking the back of his head. Denies LOC, but experienced lightheadedness. No vomiting. No meds PTA.   Fall       Home Medications Prior to Admission medications   Medication Sig Start Date End Date Taking? Authorizing Provider  albuterol  (VENTOLIN  HFA) 108 (90 Base) MCG/ACT inhaler Inhale 1-2 puffs into the lungs every 6 (six) hours as needed for wheezing or shortness of breath. 05/19/21   Prosperi, Christian H, PA-C  amoxicillin -clavulanate (AUGMENTIN ) 875-125 MG tablet Take 1 tablet by mouth every 12 (twelve) hours. 05/02/23   Armenta Canning, MD  bacitracin  ointment Apply topically 2 times daily. 05/02/23   Armenta Canning, MD  benzonatate  (TESSALON ) 100 MG capsule Take 1 capsule (100 mg total) by mouth every 8 (eight) hours. 02/18/23   Smoot, Sarah A, PA-C  fluticasone  (FLONASE ) 50 MCG/ACT nasal spray Place 1 spray into both nostrils daily. 05/19/21   Prosperi, Christian H, PA-C  metFORMIN  (GLUCOPHAGE ) 500 MG tablet Take 1 tablet (500 mg total) by mouth 2 (two) times daily with a meal. 04/22/23   Raford Lenis, MD  naproxen  (NAPROSYN ) 500 MG tablet Take 1 tablet (500 mg total) by mouth 2 (two) times daily with a meal. 03/10/23   Rancour, Garnette, MD  ondansetron  (ZOFRAN ) 4 MG tablet Take 1 tablet (4 mg total) by mouth every 6 (six) hours as needed for nausea or vomiting. 04/09/23   Bernis Ernst, PA-C  risperiDONE  (RISPERDAL ) 2 MG tablet Take 1 tablet (2 mg total) by mouth at bedtime. 04/10/22 10/07/22  Marlee Lynwood NOVAK, MD      Allergies    Penicillins and Povidone-iodine    Review  of Systems   Review of Systems Ten systems reviewed and are negative for acute change, except as noted in the HPI.    Physical Exam Updated Vital Signs BP 134/84 (BP Location: Left Arm)   Pulse 84   Temp 98.4 F (36.9 C) (Oral)   Resp 20   Ht 5' 11 (1.803 m)   Wt 76.4 kg   SpO2 99%   BMI 23.48 kg/m   Physical Exam Vitals and nursing note reviewed.  Constitutional:      General: He is not in acute distress.    Appearance: He is well-developed. He is not diaphoretic.  HENT:     Head: Normocephalic.     Comments: Hematoma to posterior scalp. No battle's sign or raccoon's eyes. No laceration.  Eyes:     General: No scleral icterus.    Conjunctiva/sclera: Conjunctivae normal.  Pulmonary:     Effort: Pulmonary effort is normal. No respiratory distress.     Comments: Respirations even and unlabored Musculoskeletal:        General: Normal range of motion.     Cervical back: Normal range of motion.  Skin:    General: Skin is warm and dry.     Coloration: Skin is not pale.     Findings: No erythema or rash.  Neurological:     General: No focal deficit present.     Mental Status: He is  alert and oriented to person, place, and time.     Coordination: Coordination normal.     Comments: No focal neurologic deficits noted. Fully conversant.  Psychiatric:        Behavior: Behavior normal.     ED Results / Procedures / Treatments   Labs (all labs ordered are listed, but only abnormal results are displayed) Labs Reviewed - No data to display  EKG None  Radiology CT HEAD WO CONTRAST ( ) Result Date: 05/11/2023 CLINICAL DATA:  Head trauma, moderate-severe He states he was walking across the snow in from of [facility] and he slipped on something round and hit his head. Denies loc. C/o occipital pain EXAM: CT HEAD WITHOUT CONTRAST TECHNIQUE: Contiguous axial images were obtained from the base of the skull through the vertex without intravenous contrast. RADIATION DOSE  REDUCTION: This exam was performed according to the departmental dose-optimization program which includes automated exposure control, adjustment of the mA and/or kV according to patient size and/or use of iterative reconstruction technique. COMPARISON:  None Available. FINDINGS: Brain: No evidence of large-territorial acute infarction. No parenchymal hemorrhage. No mass lesion. No extra-axial collection. No mass effect or midline shift. No hydrocephalus. Basilar cisterns are patent. Vascular: No hyperdense vessel. Skull: No acute fracture or focal lesion. Sinuses/Orbits: Mild mucosal thickening of the bilateral sphenoid and ethmoid sinuses. Otherwise paranasal sinuses and mastoid air cells are clear. The orbits are unremarkable. Other: None. IMPRESSION: No acute intracranial abnormality. Electronically Signed   By: Morgane  Naveau M.D.   On: 05/11/2023 22:49    Procedures Procedures    Medications Ordered in ED Medications  acetaminophen  (TYLENOL ) tablet 1,000 mg (has no administration in time range)  naproxen  (NAPROSYN ) tablet 500 mg (has no administration in time range)    ED Course/ Medical Decision Making/ A&P                                 Medical Decision Making Amount and/or Complexity of Data Reviewed Radiology: ordered.  Risk OTC drugs. Prescription drug management.   This patient presents to the ED for concern of head injury, this involves an extensive number of treatment options, and is a complaint that carries with it a high risk of complications and morbidity.  The differential diagnosis includes concussion vs contusion vs ICH vs skull fx   Co morbidities that complicate the patient evaluation  Schizophrenia DM   Additional history obtained:  Additional history obtained from EMS personnel   Imaging Studies ordered:  I ordered imaging studies including CT head  I independently visualized and interpreted imaging which showed no intracranial abnormality I agree  with the radiologist interpretation   Cardiac Monitoring:  The patient was maintained on a cardiac monitor.  I personally viewed and interpreted the cardiac monitored which showed an underlying rhythm of: NSR   Medicines ordered and prescription drug management:  I ordered medication including Tylenol  and Aleve  for pain  Reevaluation of the patient after these medicines showed that the patient improved I have reviewed the patients home medicines and have made adjustments as needed   Problem List / ED Course:  As above Hx of head injury 2/2 mechanical fall. No LOC. No N/V. Neurovascularly intact. Ambulatory and moving all extremities. CT head negative for skull fx, ICH, traumatic hydrocephalus.   Reevaluation:  After the interventions noted above, I reevaluated the patient and found that they have :improved   Social Determinants of Health:  Homeless    Dispostion:  After consideration of the diagnostic results and the patients response to treatment, I feel that the patent would benefit from supportive care measures and outpatient PCP f/u for recheck. Return precautions discussed and provided. Patient discharged in stable condition with no unaddressed concerns.          Final Clinical Impression(s) / ED Diagnoses Final diagnoses:  Injury of head, initial encounter    Rx / DC Orders ED Discharge Orders     None         Keith Sor, PA-C 05/12/23 0051    Raford Lenis, MD 05/12/23 [phone]

## 2023-05-12 NOTE — Discharge Instructions (Signed)
 You were seen in the emerged part for left hand pain There is no new injuries and there does not appear to be an acute infection No need for more antibiotics at this time Follow-up with your primary care doctor Return for significant pain or any other concerns

## 2023-05-12 NOTE — ED Notes (Signed)
Happy meal given 

## 2023-05-13 ENCOUNTER — Encounter (HOSPITAL_COMMUNITY): Payer: Self-pay | Admitting: Emergency Medicine

## 2023-05-13 ENCOUNTER — Emergency Department (HOSPITAL_COMMUNITY)
Admission: EM | Admit: 2023-05-13 | Discharge: 2023-05-14 | Disposition: A | Discharge status: Home / Self Care | Payer: PPO | Source: Home / Self Care | Attending: Emergency Medicine | Admitting: Emergency Medicine

## 2023-05-13 ENCOUNTER — Emergency Department (HOSPITAL_COMMUNITY)
Admission: EM | Admit: 2023-05-13 | Discharge: 2023-05-13 | Discharge status: Against Medical Advice | Payer: PPO | Attending: Emergency Medicine | Admitting: Emergency Medicine

## 2023-05-13 ENCOUNTER — Other Ambulatory Visit: Payer: Self-pay

## 2023-05-13 DIAGNOSIS — Z5321 Procedure and treatment not carried out due to patient leaving prior to being seen by health care provider: Secondary | ICD-10-CM | POA: Insufficient documentation

## 2023-05-13 DIAGNOSIS — L03119 Cellulitis of unspecified part of limb: Secondary | ICD-10-CM

## 2023-05-13 DIAGNOSIS — X58XXXA Exposure to other specified factors, initial encounter: Secondary | ICD-10-CM | POA: Insufficient documentation

## 2023-05-13 DIAGNOSIS — M545 Low back pain, unspecified: Secondary | ICD-10-CM | POA: Insufficient documentation

## 2023-05-13 DIAGNOSIS — Z5900 Homelessness unspecified: Secondary | ICD-10-CM | POA: Insufficient documentation

## 2023-05-13 DIAGNOSIS — S61002A Unspecified open wound of left thumb without damage to nail, initial encounter: Secondary | ICD-10-CM | POA: Diagnosis not present

## 2023-05-13 DIAGNOSIS — S6990XA Unspecified injury of unspecified wrist, hand and finger(s), initial encounter: Secondary | ICD-10-CM | POA: Diagnosis not present

## 2023-05-13 DIAGNOSIS — L03114 Cellulitis of left upper limb: Secondary | ICD-10-CM | POA: Insufficient documentation

## 2023-05-13 DIAGNOSIS — M79642 Pain in left hand: Secondary | ICD-10-CM | POA: Diagnosis present

## 2023-05-13 NOTE — ED Notes (Signed)
 Pt left triage and went to [facility] or somewhere else in the hospital.

## 2023-05-13 NOTE — ED Triage Notes (Signed)
 Pt reports wound on left thumb. Pt states "I just want some neosporin to draw the infection out."

## 2023-05-13 NOTE — ED Triage Notes (Signed)
 Patient BIB EMS c/o hand infection. Patient wants his hand to be cleaned.

## 2023-05-13 NOTE — ED Notes (Signed)
 Checks in multiple times daily then walks out of lobby into hospital

## 2023-05-13 NOTE — ED Notes (Signed)
 Pt seen leaving ED through the waiting room front doors.

## 2023-05-14 MED ORDER — SULFAMETHOXAZOLE-TRIMETHOPRIM 800-160 MG PO TABS
1.0000 | ORAL_TABLET | Freq: Once | ORAL | Status: AC
  Administered 2023-05-14: 1 via ORAL
  Filled 2023-05-14: qty 1

## 2023-05-14 MED ORDER — ACETAMINOPHEN 500 MG PO TABS
1000.0000 mg | ORAL_TABLET | Freq: Once | ORAL | Status: AC
  Administered 2023-05-14: 1000 mg via ORAL
  Filled 2023-05-14: qty 2

## 2023-05-14 MED ORDER — SULFAMETHOXAZOLE-TRIMETHOPRIM 800-160 MG PO TABS: 1.0000 | ORAL_TABLET | Freq: Two times a day (BID) | ORAL | 0 refills | Status: AC

## 2023-05-14 NOTE — ED Notes (Signed)
 Pt is sleeping, will assess vitals when he wakes.

## 2023-05-14 NOTE — ED Provider Notes (Signed)
 [place] EMERGENCY DEPARTMENT AT [facility] Provider Note   CSN: [id number] Arrival date & time: 05/13/23  2213     History  No chief complaint on file.   Ulas Zuercher is a 48 y.o. male.  HPI Patient presents with pain in his left hand, low back pain.  Patient notes ongoing homelessness, questionable medication compliance.  Unclear onset of left hand pain, but on chart review it seems though the patient has previously been diagnosed with skin lesions.  Patient is sleeping, but awakens, answers questions briefly, but appropriately.    Home Medications Prior to Admission medications   Medication Sig Start Date End Date Taking? Authorizing Provider  sulfamethoxazole -trimethoprim  (BACTRIM  DS) 800-160 MG tablet Take 1 tablet by mouth 2 (two) times daily for 7 days. 05/14/23 05/21/23 Yes Garrick Charleston, MD  albuterol  (VENTOLIN  HFA) 108 [phone] Base) MCG/ACT inhaler Inhale 1-2 puffs into the lungs every 6 (six) hours as needed for wheezing or shortness of breath. 05/19/21   Prosperi, Christian H, PA-C  bacitracin  ointment Apply topically 2 times daily. 05/02/23   Armenta Canning, MD  benzonatate  (TESSALON ) 100 MG capsule Take 1 capsule (100 mg total) by mouth every 8 (eight) hours. 02/18/23   Smoot, Lauraine LABOR, PA-C  fluticasone  (FLONASE ) 50 MCG/ACT nasal spray Place 1 spray into both nostrils daily. 05/19/21   Prosperi, Christian H, PA-C  metFORMIN  (GLUCOPHAGE ) 500 MG tablet Take 1 tablet (500 mg total) by mouth 2 (two) times daily with a meal. 04/22/23   Raford Lenis, MD  naproxen  (NAPROSYN ) 500 MG tablet Take 1 tablet (500 mg total) by mouth 2 (two) times daily with a meal. 03/10/23   Rancour, Garnette, MD  ondansetron  (ZOFRAN ) 4 MG tablet Take 1 tablet (4 mg total) by mouth every 6 (six) hours as needed for nausea or vomiting. 04/09/23   Bernis Ernst, PA-C  risperiDONE  (RISPERDAL ) 2 MG tablet Take 1 tablet (2 mg total) by mouth at bedtime. 04/10/22 10/07/22  Marlee Lynwood NOVAK, MD       Allergies    Penicillins and Povidone-iodine    Review of Systems   Review of Systems  Physical Exam Updated Vital Signs BP (!) 144/87 (BP Location: Left Arm)   Pulse 69   Temp 97.9 F (36.6 C) (Oral)   Resp 18   Ht 5' 11 (1.803 m)   Wt 89 kg   SpO2 95%   BMI 27.37 kg/m  Physical Exam Vitals and nursing note reviewed.  Constitutional:      General: He is not in acute distress.    Appearance: He is well-developed.     Comments: Disheveled adult male resting comfortably.  HENT:     Head: Normocephalic and atraumatic.  Eyes:     Conjunctiva/sclera: Conjunctivae normal.  Cardiovascular:     Rate and Rhythm: Normal rate and regular rhythm.  Pulmonary:     Effort: Pulmonary effort is normal. No respiratory distress.     Breath sounds: No stridor.  Abdominal:     General: There is no distension.  Skin:    General: Skin is warm and dry.       Neurological:     Mental Status: He is alert and oriented to person, place, and time.     ED Results / Procedures / Treatments   Labs (all labs ordered are listed, but only abnormal results are displayed) Labs Reviewed - No data to display  EKG None  Radiology No results found.  Procedures Procedures  Medications Ordered in ED Medications  sulfamethoxazole -trimethoprim  (BACTRIM  DS) 800-160 MG per tablet 1 tablet (has no administration in time range)  acetaminophen  (TYLENOL ) tablet 1,000 mg (has no administration in time range)    ED Course/ Medical Decision Making/ A&P                                 Medical Decision Making Adult male with homelessness, presents with hand pain, back pain, has exam consistent with cellulitis of his left hand, otherwise no distress, no evidence of bacteremia, sepsis.  Patient has social challenges, but notes that he is able to obtain his medication.  He received initial antibiotics here was discharged in stable condition.  Amount and/or Complexity of Data Reviewed External  Data Reviewed: notes.    Details: 93 prior ED visits in the past 6 months  Risk OTC drugs. Prescription drug management. Diagnosis or treatment significantly limited by social determinants of health.  Final Clinical Impression(s) / ED Diagnoses Final diagnoses:  Cellulitis of hand    Rx / DC Orders ED Discharge Orders          Ordered    sulfamethoxazole -trimethoprim  (BACTRIM  DS) 800-160 MG tablet  2 times daily        05/14/23 0813              Garrick Charleston, MD 05/14/23 [phone]

## 2023-05-14 NOTE — ED Notes (Signed)
Patient refused vitals signs

## 2023-05-14 NOTE — Discharge Instructions (Signed)
 You have cellulitis of your left hand.  This is an infection of the skin.  It is important to obtain and take your antibiotics as prescribed.  Follow-up with your physician.  Return here for concerning changes in your condition.

## 2023-05-15 ENCOUNTER — Other Ambulatory Visit (HOSPITAL_COMMUNITY): Payer: Self-pay

## 2023-05-17 ENCOUNTER — Other Ambulatory Visit: Payer: Self-pay

## 2023-05-17 ENCOUNTER — Emergency Department (HOSPITAL_COMMUNITY)
Admission: EM | Admit: 2023-05-17 | Discharge: 2023-05-18 | Disposition: A | Discharge status: Home / Self Care | Payer: PPO | Attending: Emergency Medicine | Admitting: Emergency Medicine

## 2023-05-17 ENCOUNTER — Encounter (HOSPITAL_COMMUNITY): Payer: Self-pay | Admitting: Emergency Medicine

## 2023-05-17 DIAGNOSIS — R051 Acute cough: Secondary | ICD-10-CM

## 2023-05-17 DIAGNOSIS — R739 Hyperglycemia, unspecified: Secondary | ICD-10-CM | POA: Diagnosis not present

## 2023-05-17 DIAGNOSIS — Z20822 Contact with and (suspected) exposure to covid-19: Secondary | ICD-10-CM | POA: Diagnosis not present

## 2023-05-17 DIAGNOSIS — R059 Cough, unspecified: Secondary | ICD-10-CM | POA: Insufficient documentation

## 2023-05-17 LAB — RESP PANEL BY RT-PCR (RSV, FLU A&B, COVID)  RVPGX2
Influenza A by PCR: NEGATIVE
Influenza B by PCR: NEGATIVE
Resp Syncytial Virus by PCR: NEGATIVE
SARS Coronavirus 2 by RT PCR: NEGATIVE

## 2023-05-17 NOTE — ED Triage Notes (Signed)
BIBA Per EMS: Pt coming in for cough x 4 days. CBG 413. Non compliant w/ meds.  VSS

## 2023-05-18 ENCOUNTER — Emergency Department (HOSPITAL_COMMUNITY)
Admission: EM | Admit: 2023-05-18 | Discharge: 2023-05-18 | Disposition: A | Discharge status: Home / Self Care | Payer: PPO | Attending: Emergency Medicine | Admitting: Emergency Medicine

## 2023-05-18 ENCOUNTER — Encounter (HOSPITAL_COMMUNITY): Payer: Self-pay | Admitting: Emergency Medicine

## 2023-05-18 ENCOUNTER — Other Ambulatory Visit: Payer: Self-pay

## 2023-05-18 DIAGNOSIS — J45909 Unspecified asthma, uncomplicated: Secondary | ICD-10-CM | POA: Insufficient documentation

## 2023-05-18 DIAGNOSIS — M7918 Myalgia, other site: Secondary | ICD-10-CM | POA: Diagnosis not present

## 2023-05-18 DIAGNOSIS — I10 Essential (primary) hypertension: Secondary | ICD-10-CM | POA: Insufficient documentation

## 2023-05-18 DIAGNOSIS — Z765 Malingerer [conscious simulation]: Secondary | ICD-10-CM | POA: Insufficient documentation

## 2023-05-18 DIAGNOSIS — E1140 Type 2 diabetes mellitus with diabetic neuropathy, unspecified: Secondary | ICD-10-CM | POA: Insufficient documentation

## 2023-05-18 DIAGNOSIS — Z5900 Homelessness unspecified: Secondary | ICD-10-CM | POA: Diagnosis not present

## 2023-05-18 DIAGNOSIS — R519 Headache, unspecified: Secondary | ICD-10-CM | POA: Diagnosis not present

## 2023-05-18 DIAGNOSIS — R0981 Nasal congestion: Secondary | ICD-10-CM | POA: Diagnosis not present

## 2023-05-18 DIAGNOSIS — R059 Cough, unspecified: Secondary | ICD-10-CM | POA: Insufficient documentation

## 2023-05-18 NOTE — Discharge Instructions (Signed)
We believe your persistent cough is a result of a viral syndrome for which your symptoms have still not quite resolved.  Sometimes it takes many weeks to completely go away, especially if you smoke or have chronic lung problems.  Please take any medications prescribed and follow up as recommended with your regular doctor. ° °If you develop any new or worsening symptoms, including but not limited to fever, persistent vomiting, worsening shortness of breath, or other symptoms that concern you, please return to the Emergency Department immediately. ° ° °

## 2023-05-18 NOTE — ED Triage Notes (Signed)
Pt c/o headache, body aches, cough, congestion

## 2023-05-18 NOTE — ED Provider Notes (Signed)
   Emergency Department Provider Note   I have reviewed the triage vital signs and the nursing notes.   HISTORY  Chief Complaint flu like symptoms    HPI Alejandro Baker is a 48 y.o. male with past history reviewed below presents emergency department with headache, body aches, cough/congestion.  Symptoms been going on for the past several days.  He presents to the emergency department frequently due to underlying homelessness and psychiatric issues.  Denies any suicidal or homicidal ideation.  No chest pain or shortness of breath.   Past Medical History:  Diagnosis Date   Asthma    DM (diabetes mellitus) (HCC)    Homeless    Hypertension    Neuropathy    Schizoaffective disorder, bipolar type (HCC)     Review of Systems  Constitutional: No fever/chills. Positive body aches.  Cardiovascular: Denies chest pain. Respiratory: Denies shortness of breath. Positive cough.  Gastrointestinal: No abdominal pain.  No nausea, no vomiting.  No diarrhea. Skin: Negative for rash. Neurological: Positive HA.   ____________________________________________   PHYSICAL EXAM:  VITAL SIGNS: ED Triage Vitals  Encounter Vitals Group     BP 05/18/23 2330 131/89     Pulse Rate 05/18/23 2330 91     Resp 05/18/23 2330 17     Temp 05/18/23 2330 98 F (36.7 C)     Temp Source 05/18/23 2330 Oral     SpO2 05/18/23 2330 97 %     Weight 05/18/23 2329 196 lb (88.9 kg)     Height 05/18/23 2329 5\' 11"  (1.803 m)   Constitutional: Alert and oriented. Well appearing and in no acute distress. Eyes: Conjunctivae are normal.  Head: Atraumatic. Nose: No congestion/rhinnorhea. Mouth/Throat: Mucous membranes are moist.   Neck: No stridor.   Cardiovascular: Normal rate, regular rhythm. Good peripheral circulation. Grossly normal heart sounds.   Respiratory: Normal respiratory effort.  No retractions. Lungs CTAB. Gastrointestinal: No distention.  Musculoskeletal: No gross deformities of  extremities. Neurologic:  Normal speech and language. Skin:  Skin is warm, dry and intact. No rash noted.  ____________________________________________   PROCEDURES  Procedure(s) performed:   Procedures  None  ____________________________________________   INITIAL IMPRESSION / ASSESSMENT AND PLAN / ED COURSE  Pertinent labs & imaging results that were available during my care of the patient were reviewed by me and considered in my medical decision making (see chart for details).   This patient is Presenting for Evaluation of viral illness, which does require a range of treatment options, and is a complaint that involves a moderate risk of morbidity and mortality.  The Differential Diagnoses include COVID, FLu, RSV, CAP, etc.   Social Determinants of Health Risk patient with unstable housing.   Medical Decision Making: Summary:  Patient presents emergency room with URI symptoms over the past week.  Not a candidate for antiviral medications.  Considered chest x-ray and lab testing but vital signs are reassuring.  Patient appears stable and in no distress.  Discussed continued supportive care. Stable for discharge.   Patient's presentation is most consistent with acute, uncomplicated illness.   Disposition: discharge  ____________________________________________  FINAL CLINICAL IMPRESSION(S) / ED DIAGNOSES  Final diagnoses:  Cough, unspecified type  Malingering    Note:  This document was prepared using Dragon voice recognition software and may include unintentional dictation errors.  Alona Bene, MD, [facility] Emergency Medicine    Vaibhav Fogleman, Arlyss Repress, MD 05/23/23 [phone]

## 2023-05-18 NOTE — ED Notes (Signed)
ED Provider at bedside. 

## 2023-05-18 NOTE — ED Provider Notes (Signed)
[name] EMERGENCY DEPARTMENT AT [facility] Provider Note   CSN: [id number] Arrival date & time: 05/17/23  2231     History  Chief Complaint  Patient presents with   Cough    Alejandro Baker is a 48 y.o. male.  The history is provided by the patient.  Cough Cough characteristics:  Non-productive Severity:  Mild Onset quality:  Gradual Timing:  Rare Progression:  Unchanged Chronicity:  New Context: upper respiratory infection   Relieved by:  Nothing Worsened by:  Nothing Ineffective treatments:  None tried Associated symptoms: no chest pain and no shortness of breath   Risk factors: no chemical exposure   Also concerned about heat rash of the eyebrows.       Home Medications Prior to Admission medications   Medication Sig Start Date End Date Taking? Authorizing Provider  albuterol (VENTOLIN HFA) 108 (90 Base) MCG/ACT inhaler Inhale 1-2 puffs into the lungs every 6 (six) hours as needed for wheezing or shortness of breath. 05/19/21   Prosperi, Christian H, PA-C  bacitracin ointment Apply topically 2 times daily. 05/02/23   Arby Barrette, MD  benzonatate (TESSALON) 100 MG capsule Take 1 capsule (100 mg total) by mouth every 8 (eight) hours. 02/18/23   Smoot, Sarah A, PA-C  fluticasone (FLONASE) 50 MCG/ACT nasal spray Place 1 spray into both nostrils daily. 05/19/21   Prosperi, Christian H, PA-C  metFORMIN (GLUCOPHAGE) 500 MG tablet Take 1 tablet (500 mg total) by mouth 2 (two) times daily with a meal. 04/22/23   Dione Booze, MD  naproxen (NAPROSYN) 500 MG tablet Take 1 tablet (500 mg total) by mouth 2 (two) times daily with a meal. 03/10/23   Rancour, Jeannett Senior, MD  ondansetron (ZOFRAN) 4 MG tablet Take 1 tablet (4 mg total) by mouth every 6 (six) hours as needed for nausea or vomiting. 04/09/23   Achille Rich, PA-C  risperiDONE (RISPERDAL) 2 MG tablet Take 1 tablet (2 mg total) by mouth at bedtime. 04/10/22 10/07/22  Alicia Amel, MD   sulfamethoxazole-trimethoprim (BACTRIM DS) 800-160 MG tablet Take 1 tablet by mouth 2 (two) times daily for 7 days. 05/14/23 05/21/23  Gerhard Munch, MD      Allergies    Penicillins and Povidone-iodine    Review of Systems   Review of Systems  Respiratory:  Positive for cough. Negative for shortness of breath.   Cardiovascular:  Negative for chest pain.  All other systems reviewed and are negative.   Physical Exam Updated Vital Signs BP (!) 166/106 (BP Location: Left Arm)   Pulse 95   Temp 98.3 F (36.8 C) (Oral)   Resp 18   SpO2 98%  Physical Exam Vitals and nursing note reviewed.  Constitutional:      General: He is not in acute distress.    Appearance: He is well-developed. He is not diaphoretic.  HENT:     Head: Normocephalic and atraumatic.     Nose: Nose normal.  Eyes:     Conjunctiva/sclera: Conjunctivae normal.     Pupils: Pupils are equal, round, and reactive to light.  Cardiovascular:     Rate and Rhythm: Normal rate and regular rhythm.  Pulmonary:     Effort: Pulmonary effort is normal.     Breath sounds: Normal breath sounds. No wheezing or rales.  Abdominal:     General: Bowel sounds are normal.     Palpations: Abdomen is soft.     Tenderness: There is no abdominal tenderness. There is no guarding or  rebound.  Musculoskeletal:        General: Normal range of motion.     Cervical back: Normal range of motion and neck supple.  Skin:    General: Skin is warm and dry.     Findings: No erythema or rash.     Comments: No rash present on exam  Neurological:     Mental Status: He is alert and oriented to person, place, and time.     ED Results / Procedures / Treatments   Labs (all labs ordered are listed, but only abnormal results are displayed) Labs Reviewed  RESP PANEL BY RT-PCR (RSV, FLU A&B, COVID)  RVPGX2    EKG None  Radiology No results found.  Procedures Procedures    Medications Ordered in ED Medications - No data to  display  ED Course/ Medical Decision Making/ A&P                                 Medical Decision Making Patient BIB ems for cough and also concern for heat rash of eyebrows   Amount and/or Complexity of Data Reviewed Independent Historian: EMS    Details: See above  Labs: ordered.    Details: Negative covid and flu   Risk Risk Details: Well appearing, normal exam and vitals. There is no rash.  Stable for discharge with close follow up.  Strict return.      Final Clinical Impression(s) / ED Diagnoses Final diagnoses:  None   Return for intractable cough, coughing up blood, fevers > 100.4 unrelieved by medication, shortness of breath, intractable vomiting, chest pain, shortness of breath, weakness, numbness, changes in speech, facial asymmetry, abdominal pain, passing out, Inability to tolerate liquids or food, cough, altered mental status or any concerns. No signs of systemic illness or infection. The patient is nontoxic-appearing on exam and vital signs are within normal limits.  I have reviewed the triage vital signs and the nursing notes. Pertinent labs & imaging results that were available during my care of the patient were reviewed by me and considered in my medical decision making (see chart for details). After history, exam, and medical workup I feel the patient has been appropriately medically screened and is safe for discharge home. Pertinent diagnoses were discussed with the patient. Patient was given return precautions.  Rx / DC Orders ED Discharge Orders     None         Alejandro Larsen, MD 05/18/23 0110

## 2023-05-18 NOTE — ED Notes (Signed)
Pt escorted out by security upon discharge.

## 2023-05-19 ENCOUNTER — Emergency Department (HOSPITAL_COMMUNITY)
Admission: EM | Admit: 2023-05-19 | Discharge: 2023-05-19 | Disposition: A | Discharge status: Home / Self Care | Payer: PPO | Attending: Emergency Medicine | Admitting: Emergency Medicine

## 2023-05-19 ENCOUNTER — Emergency Department (HOSPITAL_COMMUNITY)
Admission: EM | Admit: 2023-05-19 | Discharge: 2023-05-19 | Discharge status: Against Medical Advice | Payer: Medicaid Other

## 2023-05-19 ENCOUNTER — Encounter (HOSPITAL_COMMUNITY): Payer: Self-pay | Admitting: Emergency Medicine

## 2023-05-19 ENCOUNTER — Emergency Department (HOSPITAL_COMMUNITY): Admission: EM | Admit: 2023-05-19 | Discharge: 2023-05-19 | Discharge status: Against Medical Advice | Payer: PPO

## 2023-05-19 DIAGNOSIS — R000 Tachycardia, unspecified: Secondary | ICD-10-CM | POA: Diagnosis not present

## 2023-05-19 DIAGNOSIS — R52 Pain, unspecified: Secondary | ICD-10-CM | POA: Diagnosis not present

## 2023-05-19 DIAGNOSIS — Z5900 Homelessness unspecified: Secondary | ICD-10-CM | POA: Insufficient documentation

## 2023-05-19 DIAGNOSIS — R059 Cough, unspecified: Secondary | ICD-10-CM | POA: Diagnosis not present

## 2023-05-19 DIAGNOSIS — R531 Weakness: Secondary | ICD-10-CM | POA: Diagnosis not present

## 2023-05-19 DIAGNOSIS — R739 Hyperglycemia, unspecified: Secondary | ICD-10-CM | POA: Diagnosis not present

## 2023-05-19 DIAGNOSIS — R1111 Vomiting without nausea: Secondary | ICD-10-CM | POA: Diagnosis not present

## 2023-05-19 DIAGNOSIS — R051 Acute cough: Secondary | ICD-10-CM | POA: Diagnosis not present

## 2023-05-19 DIAGNOSIS — Z7984 Long term (current) use of oral hypoglycemic drugs: Secondary | ICD-10-CM | POA: Insufficient documentation

## 2023-05-19 DIAGNOSIS — J989 Respiratory disorder, unspecified: Secondary | ICD-10-CM | POA: Diagnosis not present

## 2023-05-19 MED ORDER — BENZONATATE 100 MG PO CAPS
100.0000 mg | ORAL_CAPSULE | Freq: Once | ORAL | Status: AC
  Administered 2023-05-19: 100 mg via ORAL
  Filled 2023-05-19: qty 1

## 2023-05-19 MED ORDER — ACETAMINOPHEN 500 MG PO TABS
500.0000 mg | ORAL_TABLET | Freq: Four times a day (QID) | ORAL | 0 refills | Status: AC | PRN
  Filled 2023-05-19: qty 30, 8d supply, fill #0

## 2023-05-19 MED ORDER — ACETAMINOPHEN 500 MG PO TABS
1000.0000 mg | ORAL_TABLET | Freq: Once | ORAL | Status: AC
  Administered 2023-05-19: 1000 mg via ORAL
  Filled 2023-05-19: qty 2

## 2023-05-19 MED ORDER — BENZONATATE 100 MG PO CAPS
100.0000 mg | ORAL_CAPSULE | Freq: Three times a day (TID) | ORAL | 0 refills | Status: DC
  Filled 2023-05-19: qty 21, 7d supply, fill #0

## 2023-05-19 MED ORDER — AZITHROMYCIN 250 MG PO TABS
ORAL_TABLET | ORAL | 0 refills | Status: DC
  Filled 2023-05-19: qty 6, 5d supply, fill #0

## 2023-05-19 NOTE — ED Triage Notes (Addendum)
Pt arrives via EMS from outside police station where pt was sitting outside due to being homeless. Pt reportedly asked a bystander to call EMS for ongoing cough. Pt seen yesterday for same. No respiratory distress noted. VSS in transport.

## 2023-05-19 NOTE — ED Notes (Signed)
Pt refused vitals 

## 2023-05-19 NOTE — ED Provider Notes (Signed)
[place] EMERGENCY DEPARTMENT AT [facility] Provider Note   CSN: [id number] Arrival date & time: 05/19/23  2219     History  Chief Complaint  Patient presents with   Cough    Alejandro Baker is a 48 y.o. male.  The history is provided by the patient, the EMS personnel and medical records. No language interpreter was used.  Cough    48 year old male significant history of of malingering, schizoaffective disorder, homelessness, polysubstance use, brought here via EMS with concerns of cough.  Patient was sitting outside the police station when he was asking bystander to call EMS due to an ongoing cough.  When he is seen in the ER yesterday for the same complaint and as well as the day before that.  Patient report cough is nonproductive.  Does endorse some bodyaches.  No other complaint.  No hemoptysis.  No nausea vomiting diarrhea.  Home Medications Prior to Admission medications   Medication Sig Start Date End Date Taking? Authorizing Provider  albuterol (VENTOLIN HFA) 108 (90 Base) MCG/ACT inhaler Inhale 1-2 puffs into the lungs every 6 (six) hours as needed for wheezing or shortness of breath. 05/19/21   Prosperi, Christian H, PA-C  bacitracin ointment Apply topically 2 times daily. 05/02/23   Arby Barrette, MD  benzonatate (TESSALON) 100 MG capsule Take 1 capsule (100 mg total) by mouth every 8 (eight) hours. 02/18/23   Smoot, Sarah A, PA-C  fluticasone (FLONASE) 50 MCG/ACT nasal spray Place 1 spray into both nostrils daily. 05/19/21   Prosperi, Christian H, PA-C  metFORMIN (GLUCOPHAGE) 500 MG tablet Take 1 tablet (500 mg total) by mouth 2 (two) times daily with a meal. 04/22/23   Dione Booze, MD  naproxen (NAPROSYN) 500 MG tablet Take 1 tablet (500 mg total) by mouth 2 (two) times daily with a meal. 03/10/23   Rancour, Jeannett Senior, MD  ondansetron (ZOFRAN) 4 MG tablet Take 1 tablet (4 mg total) by mouth every 6 (six) hours as needed for nausea or vomiting. 04/09/23   Achille Rich, PA-C  risperiDONE (RISPERDAL) 2 MG tablet Take 1 tablet (2 mg total) by mouth at bedtime. 04/10/22 10/07/22  Alicia Amel, MD  sulfamethoxazole-trimethoprim (BACTRIM DS) 800-160 MG tablet Take 1 tablet by mouth 2 (two) times daily for 7 days. 05/14/23 05/21/23  Gerhard Munch, MD      Allergies    Penicillins and Povidone-iodine    Review of Systems   Review of Systems  Respiratory:  Positive for cough.   All other systems reviewed and are negative.   Physical Exam Updated Vital Signs BP 117/82 (BP Location: Left Arm)   Pulse 89   Temp 100.2 F (37.9 C) (Oral)   Resp 18   Ht 5\' 11"  (1.803 m)   Wt 89 kg   SpO2 98%   BMI 27.37 kg/m  Physical Exam Vitals and nursing note reviewed.  Constitutional:      General: He is not in acute distress.    Appearance: He is well-developed.     Comments: Patient sleeping easily arousable and appears to be in no acute discomfort.  HENT:     Head: Atraumatic.  Eyes:     Conjunctiva/sclera: Conjunctivae normal.  Cardiovascular:     Rate and Rhythm: Normal rate and regular rhythm.     Pulses: Normal pulses.     Heart sounds: Normal heart sounds.  Pulmonary:     Effort: Pulmonary effort is normal.     Breath sounds: Normal breath  sounds. No wheezing, rhonchi or rales.  Abdominal:     Palpations: Abdomen is soft.  Musculoskeletal:     Cervical back: Neck supple.     Right lower leg: No edema.     Left lower leg: No edema.  Skin:    Findings: No rash.  Neurological:     Mental Status: He is alert. Mental status is at baseline.     ED Results / Procedures / Treatments   Labs (all labs ordered are listed, but only abnormal results are displayed) Labs Reviewed - No data to display  EKG None  Radiology No results found.  Procedures Procedures    Medications Ordered in ED Medications  acetaminophen (TYLENOL) tablet 1,000 mg (has no administration in time range)  benzonatate (TESSALON) capsule 100 mg (has no  administration in time range)    ED Course/ Medical Decision Making/ A&P                                 Medical Decision Making  BP 117/82 (BP Location: Left Arm)   Pulse 89   Temp 100.2 F (37.9 C) (Oral)   Resp 18   Ht 5\' 11"  (1.803 m)   Wt 89 kg   SpO2 98%   BMI 27.37 kg/m   24:23 PM  47 year old male significant history of of malingering, schizoaffective disorder, homelessness, polysubstance use, brought here via EMS with concerns of cough.  Patient was sitting outside the police station when he was asking bystander to call EMS due to an ongoing cough.  When he is seen in the ER yesterday for the same complaint and as well as the day before that.  Patient report cough is nonproductive.  Does endorse some bodyaches.  No other complaint.  No hemoptysis.  No nausea vomiting diarrhea.  Exam overall reassuring, lungs clear to auscultation bilaterally.  Vitals are notable for an oral temperature of 100.2.  No hypoxia.  EMR reviewed patient has been seen and evaluated multiple times in the ED for the same complaint.  Most recent respiratory panel is negative.  Patient is at risk for pneumonia, will prescribe Tessalon, and azithromycin as treatment.  Otherwise patient stable for discharge.  Hospital mission not considered at this time.  Doubt PE, COPD, CHF.  Social determinant of health including tobacco use and unhoused.  Patient given Tylenol and Tessalon here with improvement of symptoms.        Final Clinical Impression(s) / ED Diagnoses Final diagnoses:  Acute cough    Rx / DC Orders ED Discharge Orders          Ordered    benzonatate (TESSALON) 100 MG capsule  Every 8 hours        05/19/23 2305    azithromycin (ZITHROMAX Z-PAK) 250 MG tablet        05/19/23 2305    acetaminophen (TYLENOL) 500 MG tablet  Every 6 hours PRN        05/19/23 2305              Fayrene Helper, PA-C 05/19/23 2306    Vanetta Mulders, MD 05/22/23 1421

## 2023-05-19 NOTE — Discharge Instructions (Signed)
Your cough may indicate a lung infection.  You may take antibiotic, cough medication, and Tylenol as prescribed.  Follow-up closely with your doctor for further care.

## 2023-05-19 NOTE — ED Notes (Signed)
Pt refusing vital signs. Pt wants to go to the lobby and sleep.

## 2023-05-20 ENCOUNTER — Other Ambulatory Visit: Payer: Self-pay

## 2023-05-20 ENCOUNTER — Other Ambulatory Visit (HOSPITAL_COMMUNITY): Payer: Self-pay

## 2023-05-21 ENCOUNTER — Emergency Department (HOSPITAL_COMMUNITY)
Admission: EM | Admit: 2023-05-21 | Discharge: 2023-05-21 | Disposition: A | Discharge status: Home / Self Care | Payer: PPO | Attending: Emergency Medicine | Admitting: Emergency Medicine

## 2023-05-21 ENCOUNTER — Encounter (HOSPITAL_COMMUNITY): Payer: Self-pay | Admitting: Emergency Medicine

## 2023-05-21 ENCOUNTER — Other Ambulatory Visit: Payer: Self-pay

## 2023-05-21 DIAGNOSIS — R053 Chronic cough: Secondary | ICD-10-CM | POA: Diagnosis not present

## 2023-05-21 DIAGNOSIS — Z7984 Long term (current) use of oral hypoglycemic drugs: Secondary | ICD-10-CM | POA: Diagnosis not present

## 2023-05-21 DIAGNOSIS — I10 Essential (primary) hypertension: Secondary | ICD-10-CM | POA: Insufficient documentation

## 2023-05-21 DIAGNOSIS — R059 Cough, unspecified: Secondary | ICD-10-CM | POA: Diagnosis not present

## 2023-05-21 DIAGNOSIS — Z5900 Homelessness unspecified: Secondary | ICD-10-CM | POA: Diagnosis not present

## 2023-05-21 DIAGNOSIS — F259 Schizoaffective disorder, unspecified: Secondary | ICD-10-CM | POA: Diagnosis not present

## 2023-05-21 DIAGNOSIS — E119 Type 2 diabetes mellitus without complications: Secondary | ICD-10-CM | POA: Insufficient documentation

## 2023-05-21 DIAGNOSIS — R0902 Hypoxemia: Secondary | ICD-10-CM | POA: Diagnosis not present

## 2023-05-21 DIAGNOSIS — Z20822 Contact with and (suspected) exposure to covid-19: Secondary | ICD-10-CM | POA: Insufficient documentation

## 2023-05-21 DIAGNOSIS — R739 Hyperglycemia, unspecified: Secondary | ICD-10-CM | POA: Diagnosis not present

## 2023-05-21 LAB — RESP PANEL BY RT-PCR (RSV, FLU A&B, COVID)  RVPGX2
Influenza A by PCR: NEGATIVE
Influenza B by PCR: NEGATIVE
Resp Syncytial Virus by PCR: NEGATIVE
SARS Coronavirus 2 by RT PCR: NEGATIVE

## 2023-05-21 NOTE — ED Triage Notes (Signed)
Patient c/o cough , bodyaches. Patient denies N/V. Patient ambulatory.

## 2023-05-21 NOTE — ED Notes (Signed)
ATTEMPTED TO DO PT VITALS BUT HE SAID HIS SLEEPY

## 2023-05-21 NOTE — ED Provider Notes (Signed)
[place] EMERGENCY DEPARTMENT AT [facility] Provider Note   CSN: [id number] Arrival date & time: 05/21/23  1928     History  Chief Complaint  Patient presents with   Cough    Curtice Venkatesan is a 48 y.o. male with history of hypertension, schizoaffective disorder, diabetes, homelessness, malingering, who presents the emergency department complaining of a cough, body aches, nausea and vomiting for over a month.  Of note patient with 36 ER visits in the past 6 months.   Cough Associated symptoms: myalgias        Home Medications Prior to Admission medications   Medication Sig Start Date End Date Taking? Authorizing Provider  acetaminophen (TYLENOL) 500 MG tablet Take 1 tablet (500 mg total) by mouth every 6 (six) hours as needed. 05/19/23   Fayrene Helper, PA-C  albuterol (VENTOLIN HFA) 108 (90 Base) MCG/ACT inhaler Inhale 1-2 puffs into the lungs every 6 (six) hours as needed for wheezing or shortness of breath. 05/19/21   Prosperi, Christian H, PA-C  azithromycin (ZITHROMAX Z-PAK) 250 MG tablet Take 2 tablets by mouth on day 1 then take 1 tablet once daily for 4 days. 05/19/23   Fayrene Helper, PA-C  bacitracin ointment Apply topically 2 times daily. 05/02/23   Arby Barrette, MD  benzonatate (TESSALON) 100 MG capsule Take 1 capsule (100 mg total) by mouth every 8 (eight) hours. 05/19/23   Fayrene Helper, PA-C  fluticasone (FLONASE) 50 MCG/ACT nasal spray Place 1 spray into both nostrils daily. 05/19/21   Prosperi, Christian H, PA-C  metFORMIN (GLUCOPHAGE) 500 MG tablet Take 1 tablet (500 mg total) by mouth 2 (two) times daily with a meal. 04/22/23   Dione Booze, MD  naproxen (NAPROSYN) 500 MG tablet Take 1 tablet (500 mg total) by mouth 2 (two) times daily with a meal. 03/10/23   Rancour, Jeannett Senior, MD  ondansetron (ZOFRAN) 4 MG tablet Take 1 tablet (4 mg total) by mouth every 6 (six) hours as needed for nausea or vomiting. 04/09/23   Achille Rich, PA-C  risperiDONE (RISPERDAL) 2  MG tablet Take 1 tablet (2 mg total) by mouth at bedtime. 04/10/22 10/07/22  Alicia Amel, MD  sulfamethoxazole-trimethoprim (BACTRIM DS) 800-160 MG tablet Take 1 tablet by mouth 2 (two) times daily for 7 days. 05/14/23 05/21/23  Gerhard Munch, MD      Allergies    Penicillins and Povidone-iodine    Review of Systems   Review of Systems  Respiratory:  Positive for cough.   Gastrointestinal:  Positive for nausea.  Musculoskeletal:  Positive for myalgias.  All other systems reviewed and are negative.   Physical Exam Updated Vital Signs BP (!) 148/106 (BP Location: Left Arm)   Pulse 92   Temp 98 F (36.7 C)   Resp 18   SpO2 99%  Physical Exam Vitals and nursing note reviewed.  Constitutional:      Appearance: Normal appearance.  HENT:     Head: Normocephalic and atraumatic.  Eyes:     Conjunctiva/sclera: Conjunctivae normal.  Pulmonary:     Effort: Pulmonary effort is normal. No respiratory distress.     Breath sounds: Normal breath sounds.  Skin:    General: Skin is warm and dry.  Neurological:     Mental Status: He is alert.  Psychiatric:        Mood and Affect: Mood normal.        Behavior: Behavior normal.     ED Results / Procedures / Treatments   Labs (  all labs ordered are listed, but only abnormal results are displayed) Labs Reviewed  RESP PANEL BY RT-PCR (RSV, FLU A&B, COVID)  RVPGX2    EKG None  Radiology No results found.  Procedures Procedures    Medications Ordered in ED Medications - No data to display  ED Course/ Medical Decision Making/ A&P                                 Medical Decision Making  This patient is a 48 y.o. male who presents to the ED for concern of cough x 1 month, post tussive emesis.   Differential diagnoses prior to evaluation: Upper respiratory infection, lower respiratory infection, allergies/irritants, asthma, foreign body, medications (ACE inhibitors), reflux, CHF, interstitial lung disease  Past Medical  History / Social History / Additional history: Chart reviewed. Pertinent results include: hypertension, schizoaffective disorder, diabetes, homelessness, malingering  Physical Exam: Physical exam performed. The pertinent findings include: Hypertensive, otherwise normal vital signs.  No acute distress.  Lung sounds clear. Ambulates normally.   Medications / Treatment: Offered patient nausea and cough medication, he agreed, but followed me out of the room and walked to the lobby before receiving medication or discharge paperwork   Disposition: After consideration of the diagnostic results and the patients response to treatment, I feel that emergency department workup does not suggest an emergent condition requiring admission or immediate intervention beyond what has been performed at this time. The plan is: Discharge.  Suspect component of secondary gain as patient as been seen 6 times for same in past month, with several other ER visits otherwise. The patient is safe for discharge and has been instructed to return immediately for worsening symptoms, change in symptoms or any other concerns.  Final Clinical Impression(s) / ED Diagnoses Final diagnoses:  Chronic cough    Rx / DC Orders ED Discharge Orders     None      Portions of this report may have been transcribed using voice recognition software. Every effort was made to ensure accuracy; however, inadvertent computerized transcription errors may be present.    Jeanella Flattery 05/21/23 2318    Tilden Fossa, MD 05/22/23 [phone]

## 2023-05-21 NOTE — Discharge Instructions (Signed)
We tested you for flu, covid, and rsv and these were all negative.

## 2023-05-21 NOTE — ED Notes (Signed)
Patient walk out without discharge papers.

## 2023-05-22 ENCOUNTER — Other Ambulatory Visit: Payer: Self-pay

## 2023-05-22 ENCOUNTER — Encounter (HOSPITAL_COMMUNITY): Payer: Self-pay

## 2023-05-22 ENCOUNTER — Emergency Department (HOSPITAL_COMMUNITY)
Admission: EM | Admit: 2023-05-22 | Discharge: 2023-05-22 | Discharge status: Against Medical Advice | Payer: PPO | Attending: Emergency Medicine | Admitting: Emergency Medicine

## 2023-05-22 DIAGNOSIS — R059 Cough, unspecified: Secondary | ICD-10-CM | POA: Diagnosis present

## 2023-05-22 DIAGNOSIS — Z5321 Procedure and treatment not carried out due to patient leaving prior to being seen by health care provider: Secondary | ICD-10-CM | POA: Diagnosis not present

## 2023-05-22 DIAGNOSIS — J40 Bronchitis, not specified as acute or chronic: Secondary | ICD-10-CM | POA: Insufficient documentation

## 2023-05-22 NOTE — ED Notes (Signed)
Pt not visualized in lobby 

## 2023-05-22 NOTE — ED Triage Notes (Signed)
Pt states that he has a bronchitis cough that started today.

## 2023-05-23 ENCOUNTER — Encounter (HOSPITAL_COMMUNITY): Payer: Self-pay | Admitting: Emergency Medicine

## 2023-05-23 ENCOUNTER — Emergency Department (HOSPITAL_COMMUNITY)
Admission: EM | Admit: 2023-05-23 | Discharge: 2023-05-23 | Disposition: A | Discharge status: Home / Self Care | Payer: PPO | Attending: Emergency Medicine | Admitting: Emergency Medicine

## 2023-05-23 DIAGNOSIS — R0602 Shortness of breath: Secondary | ICD-10-CM | POA: Diagnosis not present

## 2023-05-23 DIAGNOSIS — R058 Other specified cough: Secondary | ICD-10-CM

## 2023-05-23 DIAGNOSIS — Z7984 Long term (current) use of oral hypoglycemic drugs: Secondary | ICD-10-CM | POA: Insufficient documentation

## 2023-05-23 DIAGNOSIS — R059 Cough, unspecified: Secondary | ICD-10-CM | POA: Diagnosis not present

## 2023-05-23 DIAGNOSIS — M79642 Pain in left hand: Secondary | ICD-10-CM | POA: Insufficient documentation

## 2023-05-23 DIAGNOSIS — G8929 Other chronic pain: Secondary | ICD-10-CM | POA: Insufficient documentation

## 2023-05-23 MED ORDER — ACETAMINOPHEN 500 MG PO TABS
1000.0000 mg | ORAL_TABLET | Freq: Once | ORAL | Status: AC
  Administered 2023-05-23: 1000 mg via ORAL
  Filled 2023-05-23: qty 2

## 2023-05-23 NOTE — ED Triage Notes (Addendum)
Pt homeless, reports hand pain and SOB . Pt talking in full sentences during triage

## 2023-05-23 NOTE — Discharge Instructions (Addendum)
It was our pleasure to provide your ER care today - we hope that you feel better.  If cough/congestion, try mucinex, nyquil or similar over the counter cold/flu medication.   Take acetaminophen or ibuprofen as need.  Follow up with primary care doctor in one week if symptoms fail to improve/resolve.  Return to ER if worse, new symptoms, fevers, increased trouble breathing, increased swelling/redness, severe pain, or other concern.

## 2023-05-23 NOTE — ED Provider Notes (Signed)
[place] EMERGENCY DEPARTMENT AT [facility] Provider Note   CSN: [id number] Arrival date & time: 05/23/23  4098     History  Chief Complaint  Patient presents with   Hand Pain   Cough    Alejandro Baker is a 48 y.o. male.  Patient had c/o occasional non prod cough, and chronic pain to left hand. Pt limited historian, poorly compliant w history - level 5 caveat. Denies new/recent trauma to hand. No new redness or increased swelling. No fevers.  Recent ED eval for same.  The history is provided by the patient and medical records.  Hand Pain Pertinent negatives include no chest pain, no abdominal pain and no headaches.  Shortness of Breath Associated symptoms: cough   Associated symptoms: no abdominal pain, no chest pain, no fever, no headaches, no neck pain and no sore throat        Home Medications Prior to Admission medications   Medication Sig Start Date End Date Taking? Authorizing Provider  acetaminophen (TYLENOL) 500 MG tablet Take 1 tablet (500 mg total) by mouth every 6 (six) hours as needed. 05/19/23   Fayrene Helper, PA-C  albuterol (VENTOLIN HFA) 108 (90 Base) MCG/ACT inhaler Inhale 1-2 puffs into the lungs every 6 (six) hours as needed for wheezing or shortness of breath. 05/19/21   Prosperi, Christian H, PA-C  azithromycin (ZITHROMAX Z-PAK) 250 MG tablet Take 2 tablets by mouth on day 1 then take 1 tablet once daily for 4 days. 05/19/23   Fayrene Helper, PA-C  bacitracin ointment Apply topically 2 times daily. 05/02/23   Arby Barrette, MD  benzonatate (TESSALON) 100 MG capsule Take 1 capsule (100 mg total) by mouth every 8 (eight) hours. 05/19/23   Fayrene Helper, PA-C  fluticasone (FLONASE) 50 MCG/ACT nasal spray Place 1 spray into both nostrils daily. 05/19/21   Prosperi, Christian H, PA-C  metFORMIN (GLUCOPHAGE) 500 MG tablet Take 1 tablet (500 mg total) by mouth 2 (two) times daily with a meal. 04/22/23   Dione Booze, MD  naproxen (NAPROSYN) 500 MG tablet Take 1  tablet (500 mg total) by mouth 2 (two) times daily with a meal. 03/10/23   Rancour, Jeannett Senior, MD  ondansetron (ZOFRAN) 4 MG tablet Take 1 tablet (4 mg total) by mouth every 6 (six) hours as needed for nausea or vomiting. 04/09/23   Achille Rich, PA-C  risperiDONE (RISPERDAL) 2 MG tablet Take 1 tablet (2 mg total) by mouth at bedtime. 04/10/22 10/07/22  Alicia Amel, MD      Allergies    Penicillins and Povidone-iodine    Review of Systems   Review of Systems  Constitutional:  Negative for fever.  HENT:  Negative for sore throat.   Respiratory:  Positive for cough.   Cardiovascular:  Negative for chest pain and leg swelling.  Gastrointestinal:  Negative for abdominal pain.  Musculoskeletal:  Negative for back pain and neck pain.  Neurological:  Negative for headaches.    Physical Exam Updated Vital Signs BP (!) 147/79   Pulse 97   Temp 98.1 F (36.7 C) (Oral)   Resp 19   SpO2 97%  Physical Exam Vitals and nursing note reviewed.  Constitutional:      Appearance: Normal appearance. He is well-developed.  HENT:     Head: Atraumatic.     Nose: Nose normal.     Mouth/Throat:     Mouth: Mucous membranes are moist.  Eyes:     General: No scleral icterus.  Conjunctiva/sclera: Conjunctivae normal.  Neck:     Trachea: No tracheal deviation.  Cardiovascular:     Rate and Rhythm: Normal rate and regular rhythm.     Pulses: Normal pulses.     Heart sounds: Normal heart sounds. No murmur heard.    No friction rub. No gallop.  Pulmonary:     Effort: Pulmonary effort is normal. No accessory muscle usage or respiratory distress.     Breath sounds: Normal breath sounds.  Abdominal:     General: There is no distension.     Palpations: Abdomen is soft.     Tenderness: There is no abdominal tenderness.  Musculoskeletal:        General: No swelling or tenderness.     Cervical back: Normal range of motion and neck supple. No rigidity.     Comments: No focal bony tenderness to  hand. No sts. No erythema, no cellulitis, no tenosynovitis. Radial pulse 2+. Normal cap refill in digits.  No t  Skin:    General: Skin is warm and dry.     Findings: No rash.  Neurological:     Mental Status: He is alert.     Comments: Alert, speech clear. Steady gait. Left hand nvi.  Psychiatric:        Mood and Affect: Mood normal.     ED Results / Procedures / Treatments   Labs (all labs ordered are listed, but only abnormal results are displayed) Labs Reviewed - No data to display  EKG None  Radiology No results found.  Procedures Procedures    Medications Ordered in ED Medications - No data to display  ED Course/ Medical Decision Making/ A&P                                 Medical Decision Making Problems Addressed: Left hand pain: chronic illness or injury Non-productive cough: acute illness or injury with systemic symptoms  Amount and/or Complexity of Data Reviewed External Data Reviewed: notes. Labs:  Decision-making details documented in ED Course. Radiology: independent interpretation performed. Decision-making details documented in ED Course.  Risk OTC drugs.   Recent labs and imaging reviewed.   Labs reviewed/interpreted by me - resp panel 1/21, negative.   Prior imaging left hand/wrist reviewed/interpreted by me - no fx then. No new/recent injury.   Pulse ox 97% room air. Pt breathing comfortably. Rr 14.   Pt appears stable for d/c.   Rec pcp f/u.  Will also provide social service and shelter resources.   Return precautions provided.            Final Clinical Impression(s) / ED Diagnoses Final diagnoses:  None    Rx / DC Orders ED Discharge Orders     None         Cathren Laine, MD 05/23/23 [phone]

## 2023-05-29 ENCOUNTER — Emergency Department (HOSPITAL_COMMUNITY)
Admission: EM | Admit: 2023-05-29 | Discharge: 2023-05-29 | Discharge status: Against Medical Advice | Payer: PPO | Attending: Emergency Medicine | Admitting: Emergency Medicine

## 2023-05-29 ENCOUNTER — Encounter (HOSPITAL_COMMUNITY): Payer: Self-pay

## 2023-05-29 ENCOUNTER — Emergency Department (HOSPITAL_COMMUNITY): Payer: PPO

## 2023-05-29 ENCOUNTER — Emergency Department (HOSPITAL_COMMUNITY)
Admission: EM | Admit: 2023-05-29 | Discharge: 2023-05-30 | Disposition: A | Discharge status: Home / Self Care | Payer: PPO | Source: Home / Self Care | Attending: Emergency Medicine | Admitting: Emergency Medicine

## 2023-05-29 ENCOUNTER — Encounter (HOSPITAL_COMMUNITY): Payer: Self-pay | Admitting: Pharmacy Technician

## 2023-05-29 ENCOUNTER — Other Ambulatory Visit: Payer: Self-pay

## 2023-05-29 DIAGNOSIS — G4751 Confusional arousals: Secondary | ICD-10-CM | POA: Insufficient documentation

## 2023-05-29 DIAGNOSIS — W07XXXA Fall from chair, initial encounter: Secondary | ICD-10-CM | POA: Insufficient documentation

## 2023-05-29 DIAGNOSIS — Z7984 Long term (current) use of oral hypoglycemic drugs: Secondary | ICD-10-CM | POA: Insufficient documentation

## 2023-05-29 DIAGNOSIS — E119 Type 2 diabetes mellitus without complications: Secondary | ICD-10-CM | POA: Insufficient documentation

## 2023-05-29 DIAGNOSIS — R000 Tachycardia, unspecified: Secondary | ICD-10-CM | POA: Diagnosis not present

## 2023-05-29 DIAGNOSIS — S0083XA Contusion of other part of head, initial encounter: Secondary | ICD-10-CM | POA: Diagnosis not present

## 2023-05-29 DIAGNOSIS — S0081XA Abrasion of other part of head, initial encounter: Secondary | ICD-10-CM | POA: Insufficient documentation

## 2023-05-29 DIAGNOSIS — R58 Hemorrhage, not elsewhere classified: Secondary | ICD-10-CM | POA: Diagnosis not present

## 2023-05-29 DIAGNOSIS — Z5900 Homelessness unspecified: Secondary | ICD-10-CM | POA: Insufficient documentation

## 2023-05-29 DIAGNOSIS — S0990XA Unspecified injury of head, initial encounter: Secondary | ICD-10-CM | POA: Diagnosis not present

## 2023-05-29 DIAGNOSIS — J45909 Unspecified asthma, uncomplicated: Secondary | ICD-10-CM | POA: Insufficient documentation

## 2023-05-29 DIAGNOSIS — W19XXXA Unspecified fall, initial encounter: Secondary | ICD-10-CM | POA: Insufficient documentation

## 2023-05-29 DIAGNOSIS — Z5321 Procedure and treatment not carried out due to patient leaving prior to being seen by health care provider: Secondary | ICD-10-CM | POA: Diagnosis not present

## 2023-05-29 DIAGNOSIS — R739 Hyperglycemia, unspecified: Secondary | ICD-10-CM | POA: Diagnosis not present

## 2023-05-29 DIAGNOSIS — G4489 Other headache syndrome: Secondary | ICD-10-CM | POA: Diagnosis not present

## 2023-05-29 DIAGNOSIS — I10 Essential (primary) hypertension: Secondary | ICD-10-CM | POA: Insufficient documentation

## 2023-05-29 MED ORDER — LIDOCAINE-EPINEPHRINE (PF) 2 %-1:200000 IJ SOLN: 10.0000 mL | Freq: Once | INTRAMUSCULAR | Status: DC

## 2023-05-29 NOTE — ED Triage Notes (Signed)
Pt bib ptar with complaints of rolling out of bed and hitting his head overnight. Hematoma above right eye. Pt complains of pain to area.

## 2023-05-29 NOTE — ED Notes (Signed)
Pt left facility without alerting staff

## 2023-05-29 NOTE — ED Provider Triage Note (Signed)
Emergency Medicine Provider Triage Evaluation Note  Alejandro Baker , a 48 y.o. male  was evaluated in triage.  Pt complains of head injury.  He reportedly fell out of bed or chair.  Struck his right side of his head.  Tells me that he has been a bit confused and has been throwing up.  Review of Systems  Positive: Closed head injury.  Confusion vomiting Negative: Neck pain back pain chest pain abdominal pain  Physical Exam  BP (!) 118/93   Pulse 80   Temp 98 F (36.7 C)   Resp 20   SpO2 99%  Gen:   Awake, no distress   Resp:  Normal effort  MSK:   Moves extremities without difficulty  Other:  Patient with likely of laceration over the right eyebrow.  Significant dried blood overlying.  Medical Decision Making  Medically screening exam initiated at 10:15 AM.  Appropriate orders placed.  Alejandro Baker was informed that the remainder of the evaluation will be completed by another provider, this initial triage assessment does not replace that evaluation, and the importance of remaining in the ED until their evaluation is complete.     Melene Plan, DO 05/29/23 1016

## 2023-05-29 NOTE — ED Triage Notes (Signed)
Pt is coming for a reported fall by him that occurred 4 days ago, she was picked up by medic at [employer]. He is ambulatory in the ER and otherwise stable at this time   110hr 100%ra 105bgl 98/palp pressure for medic

## 2023-05-29 NOTE — ED Provider Triage Note (Signed)
Emergency Medicine Provider Triage Evaluation Note  Alejandro Baker , a 48 y.o. male  was evaluated in triage.  Pt complains of head injury.  Patient was here earlier today but eloped.  Patient return to ED complaining of head injury.  Reports that he fell out of a chair on his girlfriend's front porch striking the right side of his head.  Denies loss of consciousness but is endorsing confusion, nausea with vomiting.  Reports last tetanus was in November, chart reviewed and confirmed.  Denies blood thinners.  Workup initiated.  Review of Systems  Positive:  Negative:   Physical Exam  BP 101/72 (BP Location: Left Arm)   Pulse (!) 117   Temp 98.3 F (36.8 C) (Oral)   Resp 19   SpO2 96%  Gen:   Awake, no distress   Resp:  Normal effort  MSK:   Moves extremities without difficulty  Other:  No focal neurodeficits.  Patient does have what appears to the laceration above his right eye.  EOMs are intact and nonpainful.  Medical Decision Making  Medically screening exam initiated at 10:22 PM.  Appropriate orders placed.  Ingvald Theisen was informed that the remainder of the evaluation will be completed by another provider, this initial triage assessment does not replace that evaluation, and the importance of remaining in the ED until their evaluation is complete.     Al Decant, PA-C 05/29/23 2223

## 2023-05-30 ENCOUNTER — Other Ambulatory Visit (HOSPITAL_COMMUNITY): Payer: Self-pay

## 2023-05-30 MED ORDER — TETANUS-DIPHTH-ACELL PERTUSSIS 5-2.5-18.5 LF-MCG/0.5 IM SUSY: 0.5000 mL | PREFILLED_SYRINGE | Freq: Once | INTRAMUSCULAR | Status: DC

## 2023-05-30 NOTE — ED Provider Notes (Signed)
[place] EMERGENCY DEPARTMENT AT [facility] Provider Note   CSN: [id number] Arrival date & time: 05/29/23  2139     History  Chief Complaint  Patient presents with   Alejandro Baker    Corwin Kuiken is a 48 y.o. male.  HPI     This is a 48 year old male well-known to our emergency department who presents after a fall.  Patient reports he fell just prior to arrival.  Came in by EMS.  He has been in the waiting room for over 8 hours at this point.  Is unable to tell me whether he lost consciousness.  Does have alcohol on board.  He is seen almost nightly for multiple complaints.  Only complaining of headache.  Home Medications Prior to Admission medications   Medication Sig Start Date End Date Taking? Authorizing Provider  acetaminophen (TYLENOL) 500 MG tablet Take 1 tablet (500 mg total) by mouth every 6 (six) hours as needed. 05/19/23   Fayrene Helper, PA-C  albuterol (VENTOLIN HFA) 108 (90 Base) MCG/ACT inhaler Inhale 1-2 puffs into the lungs every 6 (six) hours as needed for wheezing or shortness of breath. 05/19/21   Prosperi, Christian H, PA-C  azithromycin (ZITHROMAX Z-PAK) 250 MG tablet Take 2 tablets by mouth on day 1 then take 1 tablet once daily for 4 days. 05/19/23   Fayrene Helper, PA-C  bacitracin ointment Apply topically 2 times daily. 05/02/23   Arby Barrette, MD  benzonatate (TESSALON) 100 MG capsule Take 1 capsule (100 mg total) by mouth every 8 (eight) hours. 05/19/23   Fayrene Helper, PA-C  fluticasone (FLONASE) 50 MCG/ACT nasal spray Place 1 spray into both nostrils daily. 05/19/21   Prosperi, Christian H, PA-C  metFORMIN (GLUCOPHAGE) 500 MG tablet Take 1 tablet (500 mg total) by mouth 2 (two) times daily with a meal. 04/22/23   Dione Booze, MD  naproxen (NAPROSYN) 500 MG tablet Take 1 tablet (500 mg total) by mouth 2 (two) times daily with a meal. 03/10/23   Rancour, Jeannett Senior, MD  ondansetron (ZOFRAN) 4 MG tablet Take 1 tablet (4 mg total) by mouth every 6 (six) hours as  needed for nausea or vomiting. 04/09/23   Achille Rich, PA-C  risperiDONE (RISPERDAL) 2 MG tablet Take 1 tablet (2 mg total) by mouth at bedtime. 04/10/22 10/07/22  Alicia Amel, MD      Allergies    Penicillins and Povidone-iodine    Review of Systems   Review of Systems  Skin:  Positive for wound.  Neurological:  Positive for headaches.    Physical Exam Updated Vital Signs BP (!) 139/91 (BP Location: Left Arm)   Pulse 80   Temp 98.2 F (36.8 C)   Resp 17   SpO2 99%  Physical Exam Vitals and nursing note reviewed.  Constitutional:      Appearance: He is well-developed.     Comments: Sleeping, disheveled appearance, no acute distress  HENT:     Head: Normocephalic.     Comments: Abrasion right forehead    Mouth/Throat:     Mouth: Mucous membranes are dry.  Eyes:     Pupils: Pupils are equal, round, and reactive to light.  Cardiovascular:     Rate and Rhythm: Normal rate and regular rhythm.  Pulmonary:     Effort: Pulmonary effort is normal. No respiratory distress.  Abdominal:     Palpations: Abdomen is soft.     Tenderness: There is no abdominal tenderness.  Musculoskeletal:     Cervical back:  Neck supple.  Lymphadenopathy:     Cervical: No cervical adenopathy.  Skin:    General: Skin is warm and dry.  Neurological:     Mental Status: He is alert and oriented to person, place, and time.  Psychiatric:        Mood and Affect: Mood normal.     ED Results / Procedures / Treatments   Labs (all labs ordered are listed, but only abnormal results are displayed) Labs Reviewed - No data to display  EKG None  Radiology CT Head Wo Contrast Result Date: 05/30/2023 CLINICAL DATA:  Head trauma, moderate-severe EXAM: CT HEAD WITHOUT CONTRAST TECHNIQUE: Contiguous axial images were obtained from the base of the skull through the vertex without intravenous contrast. RADIATION DOSE REDUCTION: This exam was performed according to the departmental dose-optimization  program which includes automated exposure control, adjustment of the mA and/or kV according to patient size and/or use of iterative reconstruction technique. COMPARISON:  CT head 05/11/2023. FINDINGS: Brain: No evidence of acute infarction, hemorrhage, hydrocephalus, extra-axial collection or mass lesion/mass effect. Vascular: No hyperdense vessel. Skull: No acute fracture.  Right forehead contusion. Sinuses/Orbits: Mostly clear sinuses.  No acute orbital findings. IMPRESSION: 1. No evidence of acute intracranial abnormality. 2. Right forehead contusion without acute fracture. Electronically Signed   By: Feliberto Harts M.D.   On: 05/30/2023 00:41    Procedures Procedures    Medications Ordered in ED Medications - No data to display  ED Course/ Medical Decision Making/ A&P                                 Medical Decision Making  This patient presents to the ED for concern of head injury, this involves an extensive number of treatment options, and is a complaint that carries with it a high risk of complications and morbidity.  I considered the following differential and admission for this acute, potentially life threatening condition.  The differential diagnosis includes concussion, head injury, cranial bleed  MDM:    This is a 48 year old male who presents with potential head injury.  He is nontoxic and vital signs are reassuring.  He has been in the waiting room for over 8 hours.  He is not on any known blood thinners and is homeless.  He has an abrasion to the forehead.  Patient did not tolerate my attempts to clean the abrasion and asked me to stop.  No obvious laceration.  Tetanus was updated.  CT imaging does not show any intracranial bleed.  (Labs, imaging, consults)  Labs: I Ordered, and personally interpreted labs.  The pertinent results include: None  Imaging Studies ordered: I ordered imaging studies including CT head I independently visualized and interpreted imaging. I  agree with the radiologist interpretation  Additional history obtained from chart review.  External records from outside source obtained and reviewed including prior evaluations  Cardiac Monitoring: The patient was not maintained on a cardiac monitor.  If on the cardiac monitor, I personally viewed and interpreted the cardiac monitored which showed an underlying rhythm of: N/A  Reevaluation: After the interventions noted above, I reevaluated the patient and found that they have :stayed the same  Social Determinants of Health:  homeless  Disposition: discharge  Co morbidities that complicate the patient evaluation  Past Medical History:  Diagnosis Date   Asthma    DM (diabetes mellitus) (HCC)    Homeless    Hypertension  Neuropathy    Schizoaffective disorder, bipolar type (HCC)      Medicines Meds ordered this encounter  Medications   Tdap (BOOSTRIX) injection 0.5 mL    I have reviewed the patients home medicines and have made adjustments as needed  Problem List / ED Course: Problem List Items Addressed This Visit   None Visit Diagnoses       Abrasion of face, initial encounter    -  Primary     Fall, initial encounter                       Final Clinical Impression(s) / ED Diagnoses Final diagnoses:  Abrasion of face, initial encounter  Fall, initial encounter    Rx / DC Orders ED Discharge Orders     None         Daniyla Pfahler, Mayer Masker, MD 05/30/23 [phone]

## 2023-05-30 NOTE — ED Notes (Signed)
Called for repeat vitals x3 no answer

## 2023-05-31 ENCOUNTER — Encounter (HOSPITAL_COMMUNITY): Payer: Self-pay

## 2023-05-31 ENCOUNTER — Other Ambulatory Visit: Payer: Self-pay

## 2023-05-31 ENCOUNTER — Emergency Department (HOSPITAL_COMMUNITY)
Admission: EM | Admit: 2023-05-31 | Discharge: 2023-05-31 | Disposition: A | Discharge status: Home / Self Care | Payer: PPO | Attending: Emergency Medicine | Admitting: Emergency Medicine

## 2023-05-31 ENCOUNTER — Emergency Department (HOSPITAL_COMMUNITY)
Admission: EM | Admit: 2023-05-31 | Discharge: 2023-06-01 | Disposition: A | Discharge status: Home / Self Care | Payer: PPO | Source: Home / Self Care | Attending: Emergency Medicine | Admitting: Emergency Medicine

## 2023-05-31 DIAGNOSIS — S0590XA Unspecified injury of unspecified eye and orbit, initial encounter: Secondary | ICD-10-CM | POA: Diagnosis not present

## 2023-05-31 DIAGNOSIS — S0081XA Abrasion of other part of head, initial encounter: Secondary | ICD-10-CM | POA: Insufficient documentation

## 2023-05-31 DIAGNOSIS — R5383 Other fatigue: Secondary | ICD-10-CM | POA: Insufficient documentation

## 2023-05-31 DIAGNOSIS — M79646 Pain in unspecified finger(s): Secondary | ICD-10-CM | POA: Diagnosis not present

## 2023-05-31 DIAGNOSIS — E119 Type 2 diabetes mellitus without complications: Secondary | ICD-10-CM | POA: Diagnosis not present

## 2023-05-31 DIAGNOSIS — Z7984 Long term (current) use of oral hypoglycemic drugs: Secondary | ICD-10-CM | POA: Diagnosis not present

## 2023-05-31 DIAGNOSIS — F10920 Alcohol use, unspecified with intoxication, uncomplicated: Secondary | ICD-10-CM

## 2023-05-31 DIAGNOSIS — I10 Essential (primary) hypertension: Secondary | ICD-10-CM | POA: Diagnosis not present

## 2023-05-31 DIAGNOSIS — X58XXXD Exposure to other specified factors, subsequent encounter: Secondary | ICD-10-CM | POA: Insufficient documentation

## 2023-05-31 DIAGNOSIS — F10129 Alcohol abuse with intoxication, unspecified: Secondary | ICD-10-CM | POA: Diagnosis not present

## 2023-05-31 DIAGNOSIS — W1830XA Fall on same level, unspecified, initial encounter: Secondary | ICD-10-CM | POA: Insufficient documentation

## 2023-05-31 DIAGNOSIS — W19XXXA Unspecified fall, initial encounter: Secondary | ICD-10-CM | POA: Diagnosis not present

## 2023-05-31 DIAGNOSIS — R519 Headache, unspecified: Secondary | ICD-10-CM | POA: Diagnosis not present

## 2023-05-31 DIAGNOSIS — S0990XD Unspecified injury of head, subsequent encounter: Secondary | ICD-10-CM | POA: Insufficient documentation

## 2023-05-31 DIAGNOSIS — F10120 Alcohol abuse with intoxication, uncomplicated: Secondary | ICD-10-CM | POA: Diagnosis not present
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
## 2023-05-31 NOTE — ED Notes (Signed)
Provided [place] sandwich and fluids to drink. Plan is to discharge from triage.
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
## 2023-05-31 NOTE — ED Triage Notes (Signed)
Pt is coming in for a head injury he has been seen for now 3 times, he is slightly agitated coming in tonight but cooperative getting vitals. He has no other complaints at this time.

## 2023-05-31 NOTE — Discharge Instructions (Signed)
Your evaluation today is consistent with fatigue after drinking alcohol last night.  Given your lack of other complaints, and your reassuring exam we feel you are safe for discharge home.  Please rest and stay hydrated and follow-up with your primary doctor.  If any symptoms change or worsen, return to the nearest emergency department.

## 2023-06-01 DIAGNOSIS — S0990XD Unspecified injury of head, subsequent encounter: Secondary | ICD-10-CM | POA: Diagnosis not present

## 2023-06-01 MED ORDER — ACETAMINOPHEN 325 MG PO TABS
650.0000 mg | ORAL_TABLET | Freq: Once | ORAL | Status: AC
  Administered 2023-06-01: 650 mg via ORAL
  Filled 2023-06-01: qty 2

## 2023-06-01 MED ORDER — IBUPROFEN 800 MG PO TABS
800.0000 mg | ORAL_TABLET | Freq: Once | ORAL | Status: AC
  Administered 2023-06-01: 800 mg via ORAL
  Filled 2023-06-01: qty 1

## 2023-06-01 NOTE — Discharge Instructions (Signed)
You may have a concussion from her head injury.  This can cause headaches, dizziness, nausea, for up to 2 to 4 weeks.  Most people fully recover after that.  You can use Tylenol and ibuprofen as needed for pain and headaches.  Please continue drinking plenty of water to stay hydrated.

## 2023-06-01 NOTE — ED Notes (Signed)
Attempted to update patients vitals. Patient refused vitals being updated. Triage nurse made aware.

## 2023-06-01 NOTE — ED Provider Notes (Signed)
[place] EMERGENCY DEPARTMENT AT [facility] Provider Note   CSN: [id number] Arrival date & time: 05/31/23  2038     History  Chief Complaint  Patient presents with   Head Injury    Alejandro Baker is a 48 y.o. male with history of homelessness, frequent ED visits, presenting back to ED complaining of general dizziness and headache.  Patient has been seen several times for this after sustaining an initial injury approximately 1 to 2 weeks ago.  He had a CT scan as recently as 3 days ago which showed no emergent findings.  Patient did arrive late yesterday evening and had a prolonged wait in the waiting time, prior to my initial evaluation this morning at 8 AM.  He is sleeping on my initial presentation but does wake up with some effort.  He tells me he is also hungry and requesting medications.  HPI     Home Medications Prior to Admission medications   Medication Sig Start Date End Date Taking? Authorizing Provider  acetaminophen (TYLENOL) 500 MG tablet Take 1 tablet (500 mg total) by mouth every 6 (six) hours as needed. 05/19/23   Fayrene Helper, PA-C  albuterol (VENTOLIN HFA) 108 (90 Base) MCG/ACT inhaler Inhale 1-2 puffs into the lungs every 6 (six) hours as needed for wheezing or shortness of breath. 05/19/21   Prosperi, Christian H, PA-C  azithromycin (ZITHROMAX Z-PAK) 250 MG tablet Take 2 tablets by mouth on day 1 then take 1 tablet once daily for 4 days. 05/19/23   Fayrene Helper, PA-C  bacitracin ointment Apply topically 2 times daily. 05/02/23   Arby Barrette, MD  benzonatate (TESSALON) 100 MG capsule Take 1 capsule (100 mg total) by mouth every 8 (eight) hours. 05/19/23   Fayrene Helper, PA-C  fluticasone (FLONASE) 50 MCG/ACT nasal spray Place 1 spray into both nostrils daily. 05/19/21   Prosperi, Christian H, PA-C  metFORMIN (GLUCOPHAGE) 500 MG tablet Take 1 tablet (500 mg total) by mouth 2 (two) times daily with a meal. 04/22/23   Dione Booze, MD  naproxen (NAPROSYN) 500 MG  tablet Take 1 tablet (500 mg total) by mouth 2 (two) times daily with a meal. 03/10/23   Rancour, Jeannett Senior, MD  ondansetron (ZOFRAN) 4 MG tablet Take 1 tablet (4 mg total) by mouth every 6 (six) hours as needed for nausea or vomiting. 04/09/23   Achille Rich, PA-C  risperiDONE (RISPERDAL) 2 MG tablet Take 1 tablet (2 mg total) by mouth at bedtime. 04/10/22 10/07/22  Alicia Amel, MD      Allergies    Penicillins and Povidone-iodine    Review of Systems   Review of Systems  Physical Exam Updated Vital Signs BP 138/89 (BP Location: Right Arm)   Pulse 76   Temp 98.6 F (37 C) (Oral)   Resp 19   Wt 95.3 kg   SpO2 97%   BMI 29.29 kg/m  Physical Exam Constitutional:      General: He is not in acute distress. HENT:     Head: Normocephalic.     Comments: Scabbed contusion on the right anterior forehead Eyes:     Conjunctiva/sclera: Conjunctivae normal.     Pupils: Pupils are equal, round, and reactive to light.  Cardiovascular:     Rate and Rhythm: Normal rate and regular rhythm.  Pulmonary:     Effort: Pulmonary effort is normal. No respiratory distress.  Abdominal:     General: There is no distension.     Tenderness: There is  no abdominal tenderness.  Skin:    General: Skin is warm and dry.  Neurological:     General: No focal deficit present.     Mental Status: He is alert. Mental status is at baseline.  Psychiatric:        Mood and Affect: Mood normal.        Behavior: Behavior normal.     ED Results / Procedures / Treatments   Labs (all labs ordered are listed, but only abnormal results are displayed) Labs Reviewed - No data to display  EKG None  Radiology No results found.  Procedures Procedures    Medications Ordered in ED Medications  ibuprofen (ADVIL) tablet 800 mg (has no administration in time range)  acetaminophen (TYLENOL) tablet 650 mg (has no administration in time range)    ED Course/ Medical Decision Making/ A&P                                  Medical Decision Making Risk OTC drugs. Prescription drug management.   Patient is presenting with complaint of intermittent dizziness from a head injury sustained in the past.  No more recent falls or repeat head injury to warrant repeat CT scanning.  He had a scan 3 days ago which I reviewed which did not show evidence of intracranial bleed.  It is possible that he has a concussion from his initial injury, and explained him this can take 2 to 4 weeks for most people to recover.  We can give him some Tylenol and ibuprofen here as well as some food, but he would be stable otherwise for discharge.  There is no indication for blood work imaging.  Have a low suspicion for a cardiac condition or respiratory condition.  His vital signs are unremarkable this morning.        Final Clinical Impression(s) / ED Diagnoses Final diagnoses:  Injury of head, subsequent encounter    Rx / DC Orders ED Discharge Orders     None         Nimra Puccinelli, Kermit Balo, MD 06/01/23 [phone]

## 2023-06-02 ENCOUNTER — Other Ambulatory Visit: Payer: Self-pay

## 2023-06-02 ENCOUNTER — Emergency Department (HOSPITAL_COMMUNITY)
Admission: EM | Admit: 2023-06-02 | Discharge: 2023-06-02 | Discharge status: Against Medical Advice | Payer: PPO | Attending: Emergency Medicine | Admitting: Emergency Medicine

## 2023-06-02 ENCOUNTER — Encounter (HOSPITAL_COMMUNITY): Payer: Self-pay | Admitting: *Deleted

## 2023-06-02 DIAGNOSIS — R42 Dizziness and giddiness: Secondary | ICD-10-CM | POA: Insufficient documentation

## 2023-06-02 DIAGNOSIS — Z5321 Procedure and treatment not carried out due to patient leaving prior to being seen by health care provider: Secondary | ICD-10-CM | POA: Diagnosis not present

## 2023-06-02 DIAGNOSIS — R739 Hyperglycemia, unspecified: Secondary | ICD-10-CM | POA: Diagnosis not present

## 2023-06-02 NOTE — ED Notes (Signed)
Pt states that he did not want to get up and be seen.

## 2023-06-02 NOTE — ED Triage Notes (Signed)
Pt arrived with EMS for lightheadedness. Ambulatory with steady gait.

## 2023-06-05 ENCOUNTER — Emergency Department (HOSPITAL_COMMUNITY)
Admission: EM | Admit: 2023-06-05 | Discharge: 2023-06-06 | Discharge status: Against Medical Advice | Payer: PPO | Source: Home / Self Care

## 2023-06-05 ENCOUNTER — Encounter (HOSPITAL_COMMUNITY): Payer: Self-pay

## 2023-06-05 ENCOUNTER — Emergency Department (HOSPITAL_COMMUNITY)
Admission: EM | Admit: 2023-06-05 | Discharge: 2023-06-05 | Discharge status: Against Medical Advice | Payer: PPO | Attending: Emergency Medicine | Admitting: Emergency Medicine

## 2023-06-05 ENCOUNTER — Other Ambulatory Visit: Payer: Self-pay

## 2023-06-05 ENCOUNTER — Encounter (HOSPITAL_COMMUNITY): Payer: Self-pay | Admitting: *Deleted

## 2023-06-05 DIAGNOSIS — R42 Dizziness and giddiness: Secondary | ICD-10-CM | POA: Diagnosis not present

## 2023-06-05 DIAGNOSIS — R739 Hyperglycemia, unspecified: Secondary | ICD-10-CM | POA: Diagnosis not present

## 2023-06-05 DIAGNOSIS — Z5321 Procedure and treatment not carried out due to patient leaving prior to being seen by health care provider: Secondary | ICD-10-CM | POA: Insufficient documentation

## 2023-06-05 DIAGNOSIS — I10 Essential (primary) hypertension: Secondary | ICD-10-CM | POA: Diagnosis not present

## 2023-06-05 DIAGNOSIS — M79642 Pain in left hand: Secondary | ICD-10-CM | POA: Insufficient documentation

## 2023-06-05 DIAGNOSIS — Z5900 Homelessness unspecified: Secondary | ICD-10-CM | POA: Insufficient documentation

## 2023-06-05 DIAGNOSIS — R1110 Vomiting, unspecified: Secondary | ICD-10-CM | POA: Insufficient documentation

## 2023-06-05 DIAGNOSIS — G4489 Other headache syndrome: Secondary | ICD-10-CM | POA: Diagnosis not present

## 2023-06-05 LAB — I-STAT VENOUS BLOOD GAS, ED
Acid-Base Excess: 3 mmol/L — ABNORMAL HIGH (ref 0.0–2.0)
Bicarbonate: 30 mmol/L — ABNORMAL HIGH (ref 20.0–28.0)
Calcium, Ion: 1.16 mmol/L (ref 1.15–1.40)
HCT: 48 % (ref 39.0–52.0)
Hemoglobin: 16.3 g/dL (ref 13.0–17.0)
O2 Saturation: 45 %
Potassium: 4.1 mmol/L (ref 3.5–5.1)
Sodium: 136 mmol/L (ref 135–145)
TCO2: 32 mmol/L (ref 22–32)
pCO2, Ven: 50.8 mm[Hg] (ref 44–60)
pH, Ven: 7.379 (ref 7.25–7.43)
pO2, Ven: 26 mm[Hg] — CL (ref 32–45)

## 2023-06-05 LAB — COMPREHENSIVE METABOLIC PANEL
ALT: 20 U/L (ref 0–44)
AST: 20 U/L (ref 15–41)
Albumin: 3.7 g/dL (ref 3.5–5.0)
Alkaline Phosphatase: 70 U/L (ref 38–126)
Anion gap: 10 (ref 5–15)
BUN: 17 mg/dL (ref 6–20)
CO2: 27 mmol/L (ref 22–32)
Calcium: 9.2 mg/dL (ref 8.9–10.3)
Chloride: 97 mmol/L — ABNORMAL LOW (ref 98–111)
Creatinine, Ser: 0.97 mg/dL (ref 0.61–1.24)
GFR, Estimated: >60 mL/min (ref >60)
Glucose, Bld: 352 mg/dL — ABNORMAL HIGH (ref 70–99)
Potassium: 4.4 mmol/L (ref 3.5–5.1)
Sodium: 134 mmol/L — ABNORMAL LOW (ref 135–145)
Total Bilirubin: 0.6 mg/dL (ref 0.0–1.2)
Total Protein: 6.9 g/dL (ref 6.5–8.1)

## 2023-06-05 LAB — CBC
HCT: 47.9 % (ref 39.0–52.0)
Hemoglobin: 15.9 g/dL (ref 13.0–17.0)
MCH: 28.9 pg (ref 26.0–34.0)
MCHC: 33.2 g/dL (ref 30.0–36.0)
MCV: 86.9 fL (ref 80.0–100.0)
Platelets: 338 10*3/uL (ref 150–400)
RBC: 5.51 MIL/uL (ref 4.22–5.81)
RDW: 13.4 % (ref 11.5–15.5)
WBC: 8.3 10*3/uL (ref 4.0–10.5)
nRBC: 0 % (ref 0.0–0.2)

## 2023-06-05 LAB — CBG MONITORING, ED: Glucose-Capillary: 324 mg/dL — ABNORMAL HIGH (ref 70–99)

## 2023-06-05 LAB — LIPASE, BLOOD: Lipase: 31 U/L (ref 11–51)

## 2023-06-05 MED ORDER — ONDANSETRON 4 MG PO TBDP
4.0000 mg | ORAL_TABLET | Freq: Once | ORAL | Status: AC
  Administered 2023-06-05: 4 mg via ORAL
  Filled 2023-06-05: qty 1

## 2023-06-05 NOTE — ED Provider Triage Note (Signed)
 Emergency Medicine Provider Triage Evaluation Note  Alejandro Baker , a 48 y.o. male  was evaluated in triage.  Pt complains of spells of feeling hot with associated vomiting. Symptoms began earlier today. No medications given by EMS during transport. Denies CP, SOB, abdominal pain, diarrhea.  Review of Systems  Positive: As above Negative: As above  Physical Exam  BP 119/83 (BP Location: Right Arm)   Pulse 99   Temp 98.1 F (36.7 C) (Oral)   Resp 18   Ht 5' 11 (1.803 m)   Wt 95.3 kg   SpO2 98%   BMI 29.29 kg/m  Gen:   Awake, no distress. Disheveled.  Resp:  Normal effort  MSK:   Moves extremities without difficulty  Other:  Appears at known baseline. Abdomen soft, nontender.  Medical Decision Making  Medically screening exam initiated at 2:34 AM.  Appropriate orders placed.  Alejandro Baker was informed that the remainder of the evaluation will be completed by another provider, this initial triage assessment does not replace that evaluation, and the importance of remaining in the ED until their evaluation is complete.  Vomiting - Well known to ED and appearing at baseline. No active emesis. Abdomen soft, nontender. Zofran  ordered. Pending labs.   Alejandro Sor, PA-C 06/05/23 [phone]
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
## 2023-06-05 NOTE — ED Notes (Signed)
 Pt eloped from lobby.

## 2023-06-05 NOTE — ED Triage Notes (Addendum)
 Pt BIB [facility] for vomiting x4 hours. Pt denies CP, abd pain, SHOB.  168/88 111 HR 400 cbg

## 2023-06-06 ENCOUNTER — Encounter (HOSPITAL_COMMUNITY): Payer: Self-pay

## 2023-06-06 ENCOUNTER — Emergency Department (HOSPITAL_COMMUNITY)
Admission: EM | Admit: 2023-06-06 | Discharge: 2023-06-06 | Disposition: A | Discharge status: Home / Self Care | Payer: PPO | Attending: Emergency Medicine | Admitting: Emergency Medicine

## 2023-06-06 ENCOUNTER — Other Ambulatory Visit: Payer: Self-pay

## 2023-06-06 DIAGNOSIS — I10 Essential (primary) hypertension: Secondary | ICD-10-CM | POA: Insufficient documentation

## 2023-06-06 DIAGNOSIS — R21 Rash and other nonspecific skin eruption: Secondary | ICD-10-CM | POA: Diagnosis not present

## 2023-06-06 DIAGNOSIS — T1490XD Injury, unspecified, subsequent encounter: Secondary | ICD-10-CM

## 2023-06-06 DIAGNOSIS — S01111D Laceration without foreign body of right eyelid and periocular area, subsequent encounter: Secondary | ICD-10-CM | POA: Diagnosis not present

## 2023-06-06 DIAGNOSIS — J45909 Unspecified asthma, uncomplicated: Secondary | ICD-10-CM | POA: Insufficient documentation

## 2023-06-06 DIAGNOSIS — E119 Type 2 diabetes mellitus without complications: Secondary | ICD-10-CM | POA: Insufficient documentation

## 2023-06-06 DIAGNOSIS — W01198D Fall on same level from slipping, tripping and stumbling with subsequent striking against other object, subsequent encounter: Secondary | ICD-10-CM | POA: Diagnosis not present
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
## 2023-06-06 NOTE — ED Provider Notes (Signed)
 [place] EMERGENCY DEPARTMENT AT [facility] Provider Note   CSN: [id number] Arrival date & time: 06/06/23  0543     History  Chief Complaint  Patient presents with   Hand Pain    Alejandro Baker is a 48 y.o. male.  Patient is a 48 year old male with a history of schizoaffective disorder, hypertension, diabetes, asthma and being unhoused with a history of polysubstance abuse presenting today with no specific complaints.  When asked why patient came today he reports because everything is wrong and needs her Saturday where.  He explains where he has had surgery on his hand before to drain pus and last Thursday fell and hit his head but has not had any specific complaints for today.  Patient initially reported that he was vomiting however he was able to eat here and has not had any vomiting.  He was noted to walk without any difficulty.  He has no other specific complaints at this time.  The history is provided by the patient.  Hand Pain       Home Medications Prior to Admission medications   Medication Sig Start Date End Date Taking? Authorizing Provider  acetaminophen  (TYLENOL ) 500 MG tablet Take 1 tablet (500 mg total) by mouth every 6 (six) hours as needed. 05/19/23   Nivia Colon, PA-C  albuterol  (VENTOLIN  HFA) 108 (90 Base) MCG/ACT inhaler Inhale 1-2 puffs into the lungs every 6 (six) hours as needed for wheezing or shortness of breath. 05/19/21   Prosperi, Christian H, PA-C  azithromycin  (ZITHROMAX  Z-PAK) 250 MG tablet Take 2 tablets by mouth on day 1 then take 1 tablet once daily for 4 days. 05/19/23   Nivia Colon, PA-C  bacitracin  ointment Apply topically 2 times daily. 05/02/23   Armenta Canning, MD  benzonatate  (TESSALON ) 100 MG capsule Take 1 capsule (100 mg total) by mouth every 8 (eight) hours. 05/19/23   Nivia Colon, PA-C  fluticasone  (FLONASE ) 50 MCG/ACT nasal spray Place 1 spray into both nostrils daily. 05/19/21   Prosperi, Christian H, PA-C  metFORMIN  (GLUCOPHAGE )  500 MG tablet Take 1 tablet (500 mg total) by mouth 2 (two) times daily with a meal. 04/22/23   Raford Lenis, MD  naproxen  (NAPROSYN ) 500 MG tablet Take 1 tablet (500 mg total) by mouth 2 (two) times daily with a meal. 03/10/23   Rancour, Garnette, MD  ondansetron  (ZOFRAN ) 4 MG tablet Take 1 tablet (4 mg total) by mouth every 6 (six) hours as needed for nausea or vomiting. 04/09/23   Bernis Ernst, PA-C  risperiDONE  (RISPERDAL ) 2 MG tablet Take 1 tablet (2 mg total) by mouth at bedtime. 04/10/22 10/07/22  Marlee Lynwood NOVAK, MD      Allergies    Penicillins and Povidone-iodine    Review of Systems   Review of Systems  Physical Exam Updated Vital Signs BP (!) 163/108   Pulse 76   Temp 97.9 F (36.6 C) (Oral)   Resp 17   SpO2 99%  Physical Exam Vitals and nursing note reviewed.  Constitutional:      General: He is not in acute distress.    Appearance: He is well-developed.     Comments: Disheveled  HENT:     Head: Normocephalic and atraumatic.      Mouth/Throat:     Mouth: Mucous membranes are moist.  Eyes:     Extraocular Movements: Extraocular movements intact.     Conjunctiva/sclera: Conjunctivae normal.     Pupils: Pupils are equal, round, and reactive to  light.  Cardiovascular:     Rate and Rhythm: Normal rate and regular rhythm.     Heart sounds: No murmur heard. Pulmonary:     Effort: Pulmonary effort is normal. No respiratory distress.     Breath sounds: Normal breath sounds. No wheezing or rales.  Abdominal:     General: There is no distension.     Palpations: Abdomen is soft.     Tenderness: There is no abdominal tenderness. There is no guarding or rebound.  Musculoskeletal:        General: No tenderness. Normal range of motion.     Cervical back: Normal range of motion and neck supple.     Comments: Healing wound on the left thumb  Skin:    General: Skin is warm and dry.     Findings: Rash present. No erythema.     Comments: Dry flaky erythematous raised  lesions in the beard and scalp area  Neurological:     Mental Status: He is alert and oriented to person, place, and time. Mental status is at baseline.  Psychiatric:     Comments: Guarded,     ED Results / Procedures / Treatments   Labs (all labs ordered are listed, but only abnormal results are displayed) Labs Reviewed - No data to display  EKG None  Radiology No results found.  Procedures Procedures    Medications Ordered in ED Medications - No data to display  ED Course/ Medical Decision Making/ A&P                                 Medical Decision Making  Patient presenting today with no specific complaints.  Patient is noted to be hypertensive here but reports he is not taking his medications.  No specific complaints today that require further workup.  Patient does report hitting his head a week ago but wound is healing and low suspicion for intracranial injury at this time.  Patient was seen when this initially happened and imaging was negative.  No evidence of cellulitis or acute wounds today.  Encouraged patient to take this medication.  At this time no indication for further workup.        Final Clinical Impression(s) / ED Diagnoses Final diagnoses:  Healing wound    Rx / DC Orders ED Discharge Orders     None         Doretha Folks, MD 06/06/23 [phone]

## 2023-06-07 ENCOUNTER — Other Ambulatory Visit: Payer: Self-pay

## 2023-06-07 ENCOUNTER — Emergency Department (HOSPITAL_COMMUNITY)
Admission: EM | Admit: 2023-06-07 | Discharge: 2023-06-07 | Discharge status: Against Medical Advice | Payer: PPO | Attending: Emergency Medicine | Admitting: Emergency Medicine

## 2023-06-07 ENCOUNTER — Encounter (HOSPITAL_COMMUNITY): Payer: Self-pay

## 2023-06-07 DIAGNOSIS — Z5321 Procedure and treatment not carried out due to patient leaving prior to being seen by health care provider: Secondary | ICD-10-CM | POA: Insufficient documentation

## 2023-06-07 DIAGNOSIS — R209 Unspecified disturbances of skin sensation: Secondary | ICD-10-CM | POA: Diagnosis not present

## 2023-06-07 NOTE — ED Triage Notes (Signed)
 Patient said he feels cold. He said his lungs feel like they are freezing. Denies pain. Drinking coffee on arrival.

## 2023-06-08 ENCOUNTER — Encounter (HOSPITAL_COMMUNITY): Payer: Self-pay | Admitting: Emergency Medicine

## 2023-06-08 ENCOUNTER — Emergency Department (HOSPITAL_COMMUNITY)
Admission: EM | Admit: 2023-06-08 | Discharge: 2023-06-08 | Disposition: A | Discharge status: Home / Self Care | Payer: PPO | Attending: Emergency Medicine | Admitting: Emergency Medicine

## 2023-06-08 ENCOUNTER — Other Ambulatory Visit: Payer: Self-pay

## 2023-06-08 DIAGNOSIS — L900 Lichen sclerosus et atrophicus: Secondary | ICD-10-CM | POA: Insufficient documentation

## 2023-06-08 DIAGNOSIS — Z5321 Procedure and treatment not carried out due to patient leaving prior to being seen by health care provider: Secondary | ICD-10-CM | POA: Insufficient documentation

## 2023-06-08 DIAGNOSIS — M79641 Pain in right hand: Secondary | ICD-10-CM | POA: Diagnosis present

## 2023-06-08 DIAGNOSIS — R234 Changes in skin texture: Secondary | ICD-10-CM

## 2023-06-08 DIAGNOSIS — M79642 Pain in left hand: Secondary | ICD-10-CM | POA: Insufficient documentation

## 2023-06-08 DIAGNOSIS — L989 Disorder of the skin and subcutaneous tissue, unspecified: Secondary | ICD-10-CM | POA: Diagnosis not present

## 2023-06-08 NOTE — ED Provider Notes (Signed)
 [place] EMERGENCY DEPARTMENT AT [facility] Provider Note   CSN: [id number] Arrival date & time: 06/08/23  0009     History  No chief complaint on file.   Alejandro Baker is a 48 y.o. male.  The history is provided by the patient.  Hand Pain This is a new problem. The current episode started more than 1 week ago. The problem occurs constantly. The problem has been gradually improving. Pertinent negatives include no chest pain, no abdominal pain, no headaches and no shortness of breath. Nothing aggravates the symptoms. Nothing relieves the symptoms. The treatment provided significant relief.  Patient with Schizoaffective disorder and polysubstance abuse presents with ongoing scab on the left dorsal thenar eminence.       Home Medications Prior to Admission medications   Medication Sig Start Date End Date Taking? Authorizing Provider  acetaminophen  (TYLENOL ) 500 MG tablet Take 1 tablet (500 mg total) by mouth every 6 (six) hours as needed. 05/19/23   Nivia Colon, PA-C  albuterol  (VENTOLIN  HFA) 108 [id number] Base) MCG/ACT inhaler Inhale 1-2 puffs into the lungs every 6 (six) hours as needed for wheezing or shortness of breath. 05/19/21   Prosperi, Christian H, PA-C  azithromycin  (ZITHROMAX  Z-PAK) 250 MG tablet Take 2 tablets by mouth on day 1 then take 1 tablet once daily for 4 days. 05/19/23   Nivia Colon, PA-C  bacitracin  ointment Apply topically 2 times daily. 05/02/23   Armenta Canning, MD  benzonatate  (TESSALON ) 100 MG capsule Take 1 capsule (100 mg total) by mouth every 8 (eight) hours. 05/19/23   Nivia Colon, PA-C  fluticasone  (FLONASE ) 50 MCG/ACT nasal spray Place 1 spray into both nostrils daily. 05/19/21   Prosperi, Christian H, PA-C  metFORMIN  (GLUCOPHAGE ) 500 MG tablet Take 1 tablet (500 mg total) by mouth 2 (two) times daily with a meal. 04/22/23   Raford Lenis, MD  naproxen  (NAPROSYN ) 500 MG tablet Take 1 tablet (500 mg total) by mouth 2 (two) times daily with a meal.  03/10/23   Rancour, Garnette, MD  ondansetron  (ZOFRAN ) 4 MG tablet Take 1 tablet (4 mg total) by mouth every 6 (six) hours as needed for nausea or vomiting. 04/09/23   Bernis Ernst, PA-C  risperiDONE  (RISPERDAL ) 2 MG tablet Take 1 tablet (2 mg total) by mouth at bedtime. 04/10/22 10/07/22  Marlee Lynwood NOVAK, MD      Allergies    Penicillins and Povidone-iodine    Review of Systems   Review of Systems  Respiratory:  Negative for shortness of breath.   Cardiovascular:  Negative for chest pain.  Gastrointestinal:  Negative for abdominal pain.  Musculoskeletal:  Negative for arthralgias and joint swelling.  Skin:  Negative for wound.  Neurological:  Negative for headaches.  All other systems reviewed and are negative.   Physical Exam Updated Vital Signs BP 134/88   Pulse (!) 112   Temp 97.8 F (36.6 C) (Oral)   Resp 18   SpO2 98%  Physical Exam Vitals and nursing note reviewed.  Constitutional:      General: He is not in acute distress.    Appearance: Normal appearance. He is well-developed. He is not diaphoretic.  HENT:     Head: Normocephalic and atraumatic.     Nose: Nose normal.  Eyes:     Conjunctiva/sclera: Conjunctivae normal.     Pupils: Pupils are equal, round, and reactive to light.  Cardiovascular:     Rate and Rhythm: Normal rate and regular rhythm.  Pulmonary:  Effort: Pulmonary effort is normal.     Breath sounds: Normal breath sounds. No wheezing or rales.  Abdominal:     General: Bowel sounds are normal.     Palpations: Abdomen is soft.     Tenderness: There is no abdominal tenderness. There is no guarding or rebound.  Musculoskeletal:        General: Normal range of motion.     Cervical back: Normal range of motion and neck supple.  Skin:    General: Skin is warm and dry.     Capillary Refill: Capillary refill takes less than 2 seconds.  Neurological:     General: No focal deficit present.     Mental Status: He is alert and oriented to person,  place, and time.  Psychiatric:        Mood and Affect: Mood normal.     ED Results / Procedures / Treatments   Labs (all labs ordered are listed, but only abnormal results are displayed) Labs Reviewed - No data to display  EKG None  Radiology No results found.  Procedures Procedures    Medications Ordered in ED Medications - No data to display  ED Course/ Medical Decision Making/ A&P                                 Medical Decision Making Patient with scabbed lesion of the left thenar eminence   Amount and/or Complexity of Data Reviewed External Data Reviewed: notes.    Details: Previous notes reviewed   Risk Risk Details: Well appearing.  Well known to the ED.  No acute life threatening emergencies at this time.  Stable for discharge.      Final Clinical Impression(s) / ED Diagnoses Final diagnoses:  Scab   I have reviewed the triage vital signs and the nursing notes. Pertinent labs & imaging results that were available during my care of the patient were reviewed by me and considered in my medical decision making (see chart for details). After history, exam, and medical workup I feel the patient has been appropriately medically screened and is safe for discharge home. Pertinent diagnoses were discussed with the patient. Patient was given return precautions.     Rx / DC Orders ED Discharge Orders     None         Ranee Peasley, MD 06/08/23 [phone]

## 2023-06-08 NOTE — ED Triage Notes (Signed)
 Pt in with reported R hand pain per registration. Upon this RN's attempt to triage, pt laughing uncontrollably and unable to answer any triage questions. NAD noted

## 2023-06-08 NOTE — ED Notes (Signed)
 Pt discharged, left to lobby

## 2023-06-09 ENCOUNTER — Other Ambulatory Visit: Payer: Self-pay

## 2023-06-09 ENCOUNTER — Emergency Department (HOSPITAL_COMMUNITY)
Admission: EM | Admit: 2023-06-09 | Discharge: 2023-06-09 | Disposition: A | Discharge status: Home / Self Care | Payer: PPO | Source: Home / Self Care | Attending: Emergency Medicine | Admitting: Emergency Medicine

## 2023-06-09 ENCOUNTER — Encounter (HOSPITAL_COMMUNITY): Payer: Self-pay | Admitting: Emergency Medicine

## 2023-06-09 DIAGNOSIS — M79641 Pain in right hand: Secondary | ICD-10-CM

## 2023-06-09 NOTE — ED Notes (Signed)
Patient verbalizes understanding of discharge instructions. Opportunity for questioning and answers were provided. Armband removed by staff, pt discharged from ED. Ambulated out to lobby  

## 2023-06-09 NOTE — ED Provider Notes (Signed)
 Patient eloped from the ED prior to my arrival to the bedside.   This chart was dictated using voice recognition software, Dragon. Despite the best efforts of this provider to proofread and correct errors, errors may still occur which can change documentation meaning.    Bobette Pleasant SAUNDERS, PA-C 06/09/23 0316    Griselda Norris, MD 06/09/23 0500

## 2023-06-09 NOTE — ED Triage Notes (Signed)
 Pt in with L hand pain. Scab and swelling present close to knuckle of L thumb. Pt denies any drainage from site

## 2023-06-11 ENCOUNTER — Emergency Department (HOSPITAL_COMMUNITY)
Admission: EM | Admit: 2023-06-11 | Discharge: 2023-06-11 | Disposition: A | Discharge status: Home / Self Care | Payer: PPO | Attending: Emergency Medicine | Admitting: Emergency Medicine

## 2023-06-11 ENCOUNTER — Encounter (HOSPITAL_COMMUNITY): Payer: Self-pay | Admitting: Emergency Medicine

## 2023-06-11 ENCOUNTER — Other Ambulatory Visit: Payer: Self-pay

## 2023-06-11 DIAGNOSIS — I10 Essential (primary) hypertension: Secondary | ICD-10-CM | POA: Diagnosis not present

## 2023-06-11 DIAGNOSIS — J45909 Unspecified asthma, uncomplicated: Secondary | ICD-10-CM | POA: Insufficient documentation

## 2023-06-11 DIAGNOSIS — Z5900 Homelessness unspecified: Secondary | ICD-10-CM | POA: Diagnosis not present

## 2023-06-11 DIAGNOSIS — E119 Type 2 diabetes mellitus without complications: Secondary | ICD-10-CM | POA: Diagnosis not present

## 2023-06-11 DIAGNOSIS — Z79899 Other long term (current) drug therapy: Secondary | ICD-10-CM | POA: Insufficient documentation

## 2023-06-11 DIAGNOSIS — Z7984 Long term (current) use of oral hypoglycemic drugs: Secondary | ICD-10-CM | POA: Diagnosis not present

## 2023-06-11 DIAGNOSIS — M79642 Pain in left hand: Secondary | ICD-10-CM | POA: Insufficient documentation

## 2023-06-11 DIAGNOSIS — Z765 Malingerer [conscious simulation]: Secondary | ICD-10-CM

## 2023-06-11 NOTE — ED Provider Notes (Signed)
[place] EMERGENCY DEPARTMENT AT [facility] Provider Note   CSN: [id number] Arrival date & time: 06/11/23  0017     History  Chief Complaint  Patient presents with   Hand Pain    Alejandro Baker is a 48 y.o. male.  HPI     This is a 48 year old male who presents to the emergency department with reported left hand pain.  Patient will not provide any history to me.  I tried multiple times to engage in conversation with the patient.  Multiple times he turned over in the bed and stated "they told me just to lay here and I could sleep."  He will not tell me why he is here.  Per nursing report and triage note, he complained of left hand pain without any recent injuries.  He is resting comfortably.  He is uncooperative.  Home Medications Prior to Admission medications   Medication Sig Start Date End Date Taking? Authorizing Provider  acetaminophen (TYLENOL) 500 MG tablet Take 1 tablet (500 mg total) by mouth every 6 (six) hours as needed. 05/19/23   Fayrene Helper, PA-C  albuterol (VENTOLIN HFA) 108 (90 Base) MCG/ACT inhaler Inhale 1-2 puffs into the lungs every 6 (six) hours as needed for wheezing or shortness of breath. 05/19/21   Prosperi, Christian H, PA-C  azithromycin (ZITHROMAX Z-PAK) 250 MG tablet Take 2 tablets by mouth on day 1 then take 1 tablet once daily for 4 days. 05/19/23   Fayrene Helper, PA-C  bacitracin ointment Apply topically 2 times daily. 05/02/23   Arby Barrette, MD  benzonatate (TESSALON) 100 MG capsule Take 1 capsule (100 mg total) by mouth every 8 (eight) hours. 05/19/23   Fayrene Helper, PA-C  fluticasone (FLONASE) 50 MCG/ACT nasal spray Place 1 spray into both nostrils daily. 05/19/21   Prosperi, Christian H, PA-C  metFORMIN (GLUCOPHAGE) 500 MG tablet Take 1 tablet (500 mg total) by mouth 2 (two) times daily with a meal. 04/22/23   Dione Booze, MD  naproxen (NAPROSYN) 500 MG tablet Take 1 tablet (500 mg total) by mouth 2 (two) times daily with a meal. 03/10/23    Rancour, Jeannett Senior, MD  ondansetron (ZOFRAN) 4 MG tablet Take 1 tablet (4 mg total) by mouth every 6 (six) hours as needed for nausea or vomiting. 04/09/23   Achille Rich, PA-C  risperiDONE (RISPERDAL) 2 MG tablet Take 1 tablet (2 mg total) by mouth at bedtime. 04/10/22 10/07/22  Alicia Amel, MD      Allergies    Penicillins and Povidone-iodine    Review of Systems   Review of Systems  Reason unable to perform ROS: uncooperative.    Physical Exam Updated Vital Signs BP (!) 127/93 (BP Location: Left Arm)   Pulse 99   Temp 99 F (37.2 C) (Oral)   Resp 20   Ht 1.829 m (6')   Wt 88 kg   SpO2 99%   BMI 26.31 kg/m  Physical Exam Vitals and nursing note reviewed.  Constitutional:      Appearance: He is well-developed.     Comments: Disheveled, chronically ill-appearing  HENT:     Head: Normocephalic.     Comments: Multiple scabs noted Eyes:     Pupils: Pupils are equal, round, and reactive to light.  Cardiovascular:     Rate and Rhythm: Normal rate and regular rhythm.  Pulmonary:     Effort: Pulmonary effort is normal. No respiratory distress.  Abdominal:     Palpations: Abdomen is soft.  Musculoskeletal:     Cervical back: Neck supple.     Comments: No obvious deformities of the left hand, scab noted at the base of the left thumb without adjacent erythema  Lymphadenopathy:     Cervical: No cervical adenopathy.  Skin:    General: Skin is warm and dry.  Neurological:     Mental Status: He is alert and oriented to person, place, and time.     Comments: Moves all 4 extremities, ambulatory  Psychiatric:     Comments: Combative, uncooperative     ED Results / Procedures / Treatments   Labs (all labs ordered are listed, but only abnormal results are displayed) Labs Reviewed - No data to display  EKG None  Radiology No results found.  Procedures Procedures    Medications Ordered in ED Medications - No data to display  ED Course/ Medical Decision Making/  A&P                                 Medical Decision Making  This patient presents to the ED for concern of hand pain, this involves an extensive number of treatment options, and is a complaint that carries with it a high risk of complications and morbidity.  I considered the following differential and admission for this acute, potentially life threatening condition.  The differential diagnosis includes contusion, injury, fracture, malingering  MDM:    This is a 48 year old male well-known to our emergency department who presents with reported left hand pain.  He is uncooperative on my evaluation.  He will not tell me why he is here.  I tried multiple times to engage the patient without any success.  Discussed with him if he cannot talk to me regarding his complaints, I could not help him.  There does not appear to be an acute emergent process.  Given his history of ER use and 80 visits in the last 6 months, have low suspicion for acute emergent process.  His vital signs were reviewed and normal.  His ABCs are intact.  (Labs, imaging, consults)  Labs: I Ordered, and personally interpreted labs.  The pertinent results include: None  Imaging Studies ordered: I ordered imaging studies including none I independently visualized and interpreted imaging. I agree with the radiologist interpretation  Additional history obtained from chart review.  External records from outside source obtained and reviewed including prior evaluations  Cardiac Monitoring: The patient was not maintained on a cardiac monitor.  If on the cardiac monitor, I personally viewed and interpreted the cardiac monitored which showed an underlying rhythm of: N/A  Reevaluation: After the interventions noted above, I reevaluated the patient and found that they have :stayed the same  Social Determinants of Health:  homeless  Disposition: Discharge  Co morbidities that complicate the patient evaluation  Past Medical  History:  Diagnosis Date   Asthma    DM (diabetes mellitus) (HCC)    Homeless    Hypertension    Neuropathy    Schizoaffective disorder, bipolar type (HCC)      Medicines No orders of the defined types were placed in this encounter.   I have reviewed the patients home medicines and have made adjustments as needed  Problem List / ED Course: Problem List Items Addressed This Visit       Other   Malingering - Primary  Final Clinical Impression(s) / ED Diagnoses Final diagnoses:  Malingering    Rx / DC Orders ED Discharge Orders     None         Amadeus Oyama, Mayer Masker, MD 06/11/23 0225

## 2023-06-11 NOTE — ED Triage Notes (Signed)
Patient complaining of left hand pain. No new recent injuries, patient reports scab on left hand.

## 2023-06-11 NOTE — ED Notes (Signed)
This RN attempted to wake patient up by calling his name, patient remained on side facing away from RN.  This RN gently shook patient's shoulder to wake him up, informing him that we have his discharge papers.  Patient then slung his hand over at RN and stated "don't fucking push me."  This RN informed patient that he was discharged, and I had his discharge papers to review with him.  Patient jerked discharge papers out of this Rns hand and walked off with a steady gait.  Security remained at bedside.

## 2023-06-12 ENCOUNTER — Other Ambulatory Visit: Payer: Self-pay

## 2023-06-12 ENCOUNTER — Emergency Department (HOSPITAL_COMMUNITY): Admission: EM | Admit: 2023-06-12 | Discharge: 2023-06-12 | Discharge status: Against Medical Advice | Payer: PPO

## 2023-06-12 ENCOUNTER — Emergency Department (HOSPITAL_COMMUNITY)
Admission: EM | Admit: 2023-06-12 | Discharge: 2023-06-12 | Disposition: A | Discharge status: Home / Self Care | Payer: PPO | Attending: Emergency Medicine | Admitting: Emergency Medicine

## 2023-06-12 ENCOUNTER — Encounter (HOSPITAL_COMMUNITY): Payer: Self-pay | Admitting: Emergency Medicine

## 2023-06-12 ENCOUNTER — Emergency Department (HOSPITAL_COMMUNITY)
Admission: EM | Admit: 2023-06-12 | Discharge: 2023-06-13 | Disposition: A | Discharge status: Home / Self Care | Payer: PPO | Source: Home / Self Care | Attending: Emergency Medicine | Admitting: Emergency Medicine

## 2023-06-12 ENCOUNTER — Encounter (HOSPITAL_COMMUNITY): Payer: Self-pay

## 2023-06-12 DIAGNOSIS — Z5900 Homelessness unspecified: Secondary | ICD-10-CM | POA: Insufficient documentation

## 2023-06-12 DIAGNOSIS — E119 Type 2 diabetes mellitus without complications: Secondary | ICD-10-CM | POA: Insufficient documentation

## 2023-06-12 DIAGNOSIS — T699XXA Effect of reduced temperature, unspecified, initial encounter: Secondary | ICD-10-CM | POA: Insufficient documentation

## 2023-06-12 DIAGNOSIS — X31XXXA Exposure to excessive natural cold, initial encounter: Secondary | ICD-10-CM | POA: Diagnosis not present

## 2023-06-12 DIAGNOSIS — R531 Weakness: Secondary | ICD-10-CM | POA: Diagnosis not present

## 2023-06-12 DIAGNOSIS — Z79899 Other long term (current) drug therapy: Secondary | ICD-10-CM | POA: Insufficient documentation

## 2023-06-12 DIAGNOSIS — Z7984 Long term (current) use of oral hypoglycemic drugs: Secondary | ICD-10-CM | POA: Insufficient documentation

## 2023-06-12 DIAGNOSIS — I10 Essential (primary) hypertension: Secondary | ICD-10-CM | POA: Insufficient documentation

## 2023-06-12 DIAGNOSIS — Z7721 Contact with and (suspected) exposure to potentially hazardous body fluids: Secondary | ICD-10-CM | POA: Diagnosis not present

## 2023-06-12 DIAGNOSIS — J45909 Unspecified asthma, uncomplicated: Secondary | ICD-10-CM | POA: Diagnosis not present

## 2023-06-12 DIAGNOSIS — X398XXA Other exposure to forces of nature, initial encounter: Secondary | ICD-10-CM

## 2023-06-12 DIAGNOSIS — L219 Seborrheic dermatitis, unspecified: Secondary | ICD-10-CM | POA: Insufficient documentation

## 2023-06-12 DIAGNOSIS — R21 Rash and other nonspecific skin eruption: Secondary | ICD-10-CM | POA: Diagnosis present

## 2023-06-12 MED ORDER — ALBUTEROL SULFATE HFA 108 (90 BASE) MCG/ACT IN AERS: 2.0000 | INHALATION_SPRAY | RESPIRATORY_TRACT | Status: DC | PRN
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
## 2023-06-12 NOTE — ED Notes (Signed)
Called for vitals no answer

## 2023-06-12 NOTE — ED Triage Notes (Signed)
Pt states he is here bc he is cold. Axox4
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
## 2023-06-12 NOTE — ED Notes (Signed)
Called for vitals, no answer

## 2023-06-12 NOTE — ED Triage Notes (Signed)
Patient here earlier, now is back c/o chronic rash to left hand, chest and head.  Patient Aox4, in NAD.

## 2023-06-13 ENCOUNTER — Emergency Department (HOSPITAL_COMMUNITY)
Admission: EM | Admit: 2023-06-13 | Discharge: 2023-06-13 | Disposition: A | Discharge status: Home / Self Care | Payer: PPO | Attending: Emergency Medicine | Admitting: Emergency Medicine

## 2023-06-13 ENCOUNTER — Other Ambulatory Visit: Payer: Self-pay

## 2023-06-13 ENCOUNTER — Encounter (HOSPITAL_COMMUNITY): Payer: Self-pay

## 2023-06-13 DIAGNOSIS — Z87828 Personal history of other (healed) physical injury and trauma: Secondary | ICD-10-CM | POA: Insufficient documentation

## 2023-06-13 DIAGNOSIS — Z5900 Homelessness unspecified: Secondary | ICD-10-CM | POA: Insufficient documentation

## 2023-06-13 DIAGNOSIS — L539 Erythematous condition, unspecified: Secondary | ICD-10-CM | POA: Diagnosis not present

## 2023-06-13 MED ORDER — BACITRACIN ZINC 500 UNIT/GM EX OINT: TOPICAL_OINTMENT | Freq: Two times a day (BID) | CUTANEOUS | Status: DC

## 2023-06-13 NOTE — Discharge Instructions (Signed)
Use selsun blue shampoo in your beard and scalp.
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
## 2023-06-13 NOTE — ED Notes (Signed)
Pt. Placed in TRiage 1

## 2023-06-13 NOTE — ED Provider Notes (Signed)
[place] EMERGENCY DEPARTMENT AT [facility] Provider Note   CSN: [id number] Arrival date & time: 06/13/23  0416     History  Chief Complaint  Patient presents with   Hand Pain    Alejandro Baker is a 48 y.o. male.   Hand Pain  Patient presents with his 84th visit in the last 6 months.  Was here yesterday.  States he has pain and irritation on his left hand.  Has been seen multiple times for the same.  Slight erythema.  States it is previously been drained.  Patient is homeless.     Home Medications Prior to Admission medications   Medication Sig Start Date End Date Taking? Authorizing Provider  acetaminophen (TYLENOL) 500 MG tablet Take 1 tablet (500 mg total) by mouth every 6 (six) hours as needed. 05/19/23   Fayrene Helper, PA-C  albuterol (VENTOLIN HFA) 108 (90 Base) MCG/ACT inhaler Inhale 1-2 puffs into the lungs every 6 (six) hours as needed for wheezing or shortness of breath. 05/19/21   Prosperi, Christian H, PA-C  azithromycin (ZITHROMAX Z-PAK) 250 MG tablet Take 2 tablets by mouth on day 1 then take 1 tablet once daily for 4 days. 05/19/23   Fayrene Helper, PA-C  bacitracin ointment Apply topically 2 times daily. 05/02/23   Arby Barrette, MD  benzonatate (TESSALON) 100 MG capsule Take 1 capsule (100 mg total) by mouth every 8 (eight) hours. 05/19/23   Fayrene Helper, PA-C  fluticasone (FLONASE) 50 MCG/ACT nasal spray Place 1 spray into both nostrils daily. 05/19/21   Prosperi, Christian H, PA-C  metFORMIN (GLUCOPHAGE) 500 MG tablet Take 1 tablet (500 mg total) by mouth 2 (two) times daily with a meal. 04/22/23   Dione Booze, MD  naproxen (NAPROSYN) 500 MG tablet Take 1 tablet (500 mg total) by mouth 2 (two) times daily with a meal. 03/10/23   Rancour, Jeannett Senior, MD  ondansetron (ZOFRAN) 4 MG tablet Take 1 tablet (4 mg total) by mouth every 6 (six) hours as needed for nausea or vomiting. 04/09/23   Achille Rich, PA-C  risperiDONE (RISPERDAL) 2 MG tablet Take 1 tablet (2 mg  total) by mouth at bedtime. 04/10/22 10/07/22  Alicia Amel, MD      Allergies    Penicillins and Povidone-iodine    Review of Systems   Review of Systems  Physical Exam Updated Vital Signs BP 133/88 (BP Location: Left Arm)   Pulse (!) 102   Temp 97.9 F (36.6 C) (Oral)   Resp 19   Ht 6' (1.829 m)   Wt 88 kg   SpO2 100%   BMI 26.31 kg/m  Physical Exam Vitals reviewed.  Musculoskeletal:     Comments: Area of erythema on left lateral thenar area.  Appears chronic and likely healing.  No pain with movement of the hand.  Neurological:     Mental Status: He is alert.     ED Results / Procedures / Treatments   Labs (all labs ordered are listed, but only abnormal results are displayed) Labs Reviewed - No data to display  EKG None  Radiology No results found.  Procedures Procedures    Medications Ordered in ED Medications  bacitracin ointment (has no administration in time range)    ED Course/ Medical Decision Making/ A&P                                 Medical Decision Making Risk  OTC drugs.   Patient with wound on left hand.  Has been seen for same.  I think most likely just healing but will add some bacitracin in case there is a mild super superficial cellulitis discharge home.        Final Clinical Impression(s) / ED Diagnoses Final diagnoses:  Homeless  History of open hand wound    Rx / DC Orders ED Discharge Orders     None         Benjiman Core, MD 06/13/23 1020

## 2023-06-13 NOTE — ED Notes (Signed)
Pt. Asked for a breakfast tray

## 2023-06-13 NOTE — Discharge Instructions (Signed)
There is a little irritation on your hand.  The bacitracin ointment may help.

## 2023-06-13 NOTE — ED Provider Notes (Signed)
[place] EMERGENCY DEPARTMENT AT [facility] Provider Note   CSN: [id number] Arrival date & time: 06/12/23  1840     History  Chief Complaint  Patient presents with   Rash    Alejandro Baker is a 48 y.o. male who presents the emergency department for evaluation of rash of his scalp and beard.  This is 83 visit in the last 6 months.  He is here almost nightly for varied complaints.  He has a history of hypertension, schizoaffective disorder, diabetes and homelessness.  Patient complains of a flaky scalp that is very itchy it is also in his beard.  According to his triage note he believes it happened because his hair got cut by Brett Canales the devil worshiper.   Rash      Home Medications Prior to Admission medications   Medication Sig Start Date End Date Taking? Authorizing Provider  acetaminophen (TYLENOL) 500 MG tablet Take 1 tablet (500 mg total) by mouth every 6 (six) hours as needed. 05/19/23   Fayrene Helper, PA-C  albuterol (VENTOLIN HFA) 108 (90 Base) MCG/ACT inhaler Inhale 1-2 puffs into the lungs every 6 (six) hours as needed for wheezing or shortness of breath. 05/19/21   Prosperi, Christian H, PA-C  azithromycin (ZITHROMAX Z-PAK) 250 MG tablet Take 2 tablets by mouth on day 1 then take 1 tablet once daily for 4 days. 05/19/23   Fayrene Helper, PA-C  bacitracin ointment Apply topically 2 times daily. 05/02/23   Arby Barrette, MD  benzonatate (TESSALON) 100 MG capsule Take 1 capsule (100 mg total) by mouth every 8 (eight) hours. 05/19/23   Fayrene Helper, PA-C  fluticasone (FLONASE) 50 MCG/ACT nasal spray Place 1 spray into both nostrils daily. 05/19/21   Prosperi, Christian H, PA-C  metFORMIN (GLUCOPHAGE) 500 MG tablet Take 1 tablet (500 mg total) by mouth 2 (two) times daily with a meal. 04/22/23   Dione Booze, MD  naproxen (NAPROSYN) 500 MG tablet Take 1 tablet (500 mg total) by mouth 2 (two) times daily with a meal. 03/10/23   Rancour, Jeannett Senior, MD  ondansetron (ZOFRAN) 4 MG  tablet Take 1 tablet (4 mg total) by mouth every 6 (six) hours as needed for nausea or vomiting. 04/09/23   Achille Rich, PA-C  risperiDONE (RISPERDAL) 2 MG tablet Take 1 tablet (2 mg total) by mouth at bedtime. 04/10/22 10/07/22  Alicia Amel, MD      Allergies    Penicillins and Povidone-iodine    Review of Systems   Review of Systems  Skin:  Positive for rash.    Physical Exam Updated Vital Signs BP (!) 140/98 (BP Location: Right Arm)   Pulse 96   Temp 98.8 F (37.1 C) (Oral)   Resp 16   Wt 88 kg   SpO2 98%   BMI 26.31 kg/m  Physical Exam Vitals and nursing note reviewed.  Constitutional:      General: He is not in acute distress.    Appearance: He is well-developed. He is not diaphoretic.  HENT:     Head: Normocephalic and atraumatic.  Eyes:     General: No scleral icterus.    Conjunctiva/sclera: Conjunctivae normal.  Cardiovascular:     Rate and Rhythm: Normal rate and regular rhythm.     Heart sounds: Normal heart sounds.  Pulmonary:     Effort: Pulmonary effort is normal. No respiratory distress.     Breath sounds: Normal breath sounds.  Abdominal:     Palpations: Abdomen is soft.  Tenderness: There is no abdominal tenderness.  Musculoskeletal:     Cervical back: Normal range of motion and neck supple.  Skin:    General: Skin is warm and dry.     Comments: Multiple areas of erythema and thick flaking skin on the scalp and beard.  Neurological:     Mental Status: He is alert.  Psychiatric:        Behavior: Behavior normal.     ED Results / Procedures / Treatments   Labs (all labs ordered are listed, but only abnormal results are displayed) Labs Reviewed - No data to display  EKG None  Radiology No results found.  Procedures Procedures    Medications Ordered in ED Medications - No data to display  ED Course/ Medical Decision Making/ A&P                                 Medical Decision Making Here for complaint of rash.  Appears  to be consistent with seborrheic dermatitis.  Advised him to use Selsun Blue shampoo in his scalp and face.  Appropriate for discharge at this time without evidence of an emergency.        Final Clinical Impression(s) / ED Diagnoses Final diagnoses:  Seborrheic dermatitis of scalp    Rx / DC Orders ED Discharge Orders     None         Arthor Captain, PA-C 06/13/23 0254    Glynn Octave, MD 06/13/23 [phone]

## 2023-06-13 NOTE — ED Triage Notes (Signed)
Pt c/o of hand pain. He forgot to tell them about the hand pain in the visit a few hours ago.

## 2023-06-13 NOTE — ED Notes (Signed)
Pt. Given a bag lunch

## 2023-06-16 ENCOUNTER — Other Ambulatory Visit: Payer: Self-pay

## 2023-06-16 ENCOUNTER — Emergency Department (HOSPITAL_COMMUNITY)
Admission: EM | Admit: 2023-06-16 | Discharge: 2023-06-16 | Disposition: A | Discharge status: Home / Self Care | Payer: PPO | Attending: Emergency Medicine | Admitting: Emergency Medicine

## 2023-06-16 DIAGNOSIS — I10 Essential (primary) hypertension: Secondary | ICD-10-CM | POA: Diagnosis not present

## 2023-06-16 DIAGNOSIS — R519 Headache, unspecified: Secondary | ICD-10-CM | POA: Diagnosis not present

## 2023-06-16 DIAGNOSIS — Z5900 Homelessness unspecified: Secondary | ICD-10-CM | POA: Diagnosis not present

## 2023-06-16 DIAGNOSIS — G4489 Other headache syndrome: Secondary | ICD-10-CM | POA: Diagnosis not present

## 2023-06-16 NOTE — ED Triage Notes (Signed)
Pt bib [facility] and picked up from [facility]. Pt reports that he has headache for approximately 20 minutes. Pt reports no other complaints.

## 2023-06-16 NOTE — ED Provider Notes (Signed)
[place] EMERGENCY DEPARTMENT AT [facility] Provider Note   CSN: [id number] Arrival date & time: 06/16/23  0865     History  No chief complaint on file.   Alejandro Baker is a 48 y.o. male.  Patient presents to the emergency department stating that he has a headache.       Home Medications Prior to Admission medications   Medication Sig Start Date End Date Taking? Authorizing Provider  acetaminophen (TYLENOL) 500 MG tablet Take 1 tablet (500 mg total) by mouth every 6 (six) hours as needed. 05/19/23   Fayrene Helper, PA-C  albuterol (VENTOLIN HFA) 108 (90 Base) MCG/ACT inhaler Inhale 1-2 puffs into the lungs every 6 (six) hours as needed for wheezing or shortness of breath. 05/19/21   Prosperi, Christian H, PA-C  azithromycin (ZITHROMAX Z-PAK) 250 MG tablet Take 2 tablets by mouth on day 1 then take 1 tablet once daily for 4 days. 05/19/23   Fayrene Helper, PA-C  bacitracin ointment Apply topically 2 times daily. 05/02/23   Arby Barrette, MD  benzonatate (TESSALON) 100 MG capsule Take 1 capsule (100 mg total) by mouth every 8 (eight) hours. 05/19/23   Fayrene Helper, PA-C  fluticasone (FLONASE) 50 MCG/ACT nasal spray Place 1 spray into both nostrils daily. 05/19/21   Prosperi, Christian H, PA-C  metFORMIN (GLUCOPHAGE) 500 MG tablet Take 1 tablet (500 mg total) by mouth 2 (two) times daily with a meal. 04/22/23   Dione Booze, MD  naproxen (NAPROSYN) 500 MG tablet Take 1 tablet (500 mg total) by mouth 2 (two) times daily with a meal. 03/10/23   Rancour, Jeannett Senior, MD  ondansetron (ZOFRAN) 4 MG tablet Take 1 tablet (4 mg total) by mouth every 6 (six) hours as needed for nausea or vomiting. 04/09/23   Achille Rich, PA-C  risperiDONE (RISPERDAL) 2 MG tablet Take 1 tablet (2 mg total) by mouth at bedtime. 04/10/22 10/07/22  Alicia Amel, MD      Allergies    Penicillins and Povidone-iodine    Review of Systems   Review of Systems  Physical Exam Updated Vital Signs BP (!) 134/98  (BP Location: Left Wrist)   Pulse 95   Temp 98.1 F (36.7 C) (Oral)   Resp 17   Ht 6' (1.829 m)   Wt 88 kg   SpO2 99%   BMI 26.31 kg/m  Physical Exam Vitals and nursing note reviewed.  Constitutional:      General: He is not in acute distress.    Appearance: He is well-developed.  HENT:     Head: Normocephalic and atraumatic.     Mouth/Throat:     Mouth: Mucous membranes are moist.  Eyes:     General: Vision grossly intact. Gaze aligned appropriately.     Extraocular Movements: Extraocular movements intact.     Conjunctiva/sclera: Conjunctivae normal.  Cardiovascular:     Rate and Rhythm: Normal rate and regular rhythm.     Pulses: Normal pulses.     Heart sounds: Normal heart sounds, S1 normal and S2 normal. No murmur heard.    No friction rub. No gallop.  Pulmonary:     Effort: Pulmonary effort is normal. No respiratory distress.     Breath sounds: Normal breath sounds.  Abdominal:     Palpations: Abdomen is soft.     Tenderness: There is no abdominal tenderness. There is no guarding or rebound.     Hernia: No hernia is present.  Musculoskeletal:  General: No swelling.     Cervical back: Full passive range of motion without pain, normal range of motion and neck supple. No pain with movement, spinous process tenderness or muscular tenderness. Normal range of motion.     Right lower leg: No edema.     Left lower leg: No edema.  Skin:    General: Skin is warm and dry.     Capillary Refill: Capillary refill takes less than 2 seconds.     Findings: No ecchymosis, erythema, lesion or wound.  Neurological:     Mental Status: He is alert and oriented to person, place, and time.     GCS: GCS eye subscore is 4. GCS verbal subscore is 5. GCS motor subscore is 6.     Cranial Nerves: Cranial nerves 2-12 are intact.     Sensory: Sensation is intact.     Motor: Motor function is intact. No weakness or abnormal muscle tone.     Coordination: Coordination is intact.   Psychiatric:        Mood and Affect: Mood normal.        Speech: Speech normal.        Behavior: Behavior normal.     ED Results / Procedures / Treatments   Labs (all labs ordered are listed, but only abnormal results are displayed) Labs Reviewed - No data to display  EKG None  Radiology No results found.  Procedures Procedures    Medications Ordered in ED Medications - No data to display  ED Course/ Medical Decision Making/ A&P                                 Medical Decision Making  Patient has had 85 ED visits in the last 6 months.  He is homeless and comes to the ED for shelter.  He reports a headache but it is not the worst headache of his life.  He has known focal neurologic deficits.  Will be allowed to sleep, reevaluate.  I suspect he is here because it is cold and raining.        Final Clinical Impression(s) / ED Diagnoses Final diagnoses:  Homeless    Rx / DC Orders ED Discharge Orders     None         Devine Klingel, Canary Brim, MD 06/16/23 [phone]

## 2023-06-16 NOTE — ED Notes (Signed)
Pt refused discharge vital signs. Security notified to escort patient out.

## 2023-06-18 ENCOUNTER — Other Ambulatory Visit: Payer: Self-pay

## 2023-06-18 ENCOUNTER — Emergency Department (HOSPITAL_COMMUNITY)
Admission: EM | Admit: 2023-06-18 | Discharge: 2023-06-19 | Discharge status: Against Medical Advice | Payer: PPO | Attending: Emergency Medicine | Admitting: Emergency Medicine

## 2023-06-18 DIAGNOSIS — R739 Hyperglycemia, unspecified: Secondary | ICD-10-CM | POA: Diagnosis not present

## 2023-06-18 DIAGNOSIS — R5383 Other fatigue: Secondary | ICD-10-CM | POA: Diagnosis not present

## 2023-06-18 DIAGNOSIS — Z5321 Procedure and treatment not carried out due to patient leaving prior to being seen by health care provider: Secondary | ICD-10-CM | POA: Diagnosis not present

## 2023-06-18 DIAGNOSIS — E119 Type 2 diabetes mellitus without complications: Secondary | ICD-10-CM | POA: Diagnosis not present

## 2023-06-18 DIAGNOSIS — I10 Essential (primary) hypertension: Secondary | ICD-10-CM | POA: Diagnosis not present

## 2023-06-18 DIAGNOSIS — R000 Tachycardia, unspecified: Secondary | ICD-10-CM | POA: Diagnosis not present

## 2023-06-18 LAB — COMPREHENSIVE METABOLIC PANEL
ALT: 18 U/L (ref 0–44)
AST: 22 U/L (ref 15–41)
Albumin: 3.7 g/dL (ref 3.5–5.0)
Alkaline Phosphatase: 66 U/L (ref 38–126)
Anion gap: 14 (ref 5–15)
BUN: 14 mg/dL (ref 6–20)
CO2: 22 mmol/L (ref 22–32)
Calcium: 9.3 mg/dL (ref 8.9–10.3)
Chloride: 99 mmol/L (ref 98–111)
Creatinine, Ser: 0.9 mg/dL (ref 0.61–1.24)
GFR, Estimated: >60 mL/min (ref >60)
Glucose, Bld: 453 mg/dL — ABNORMAL HIGH (ref 70–99)
Potassium: 4 mmol/L (ref 3.5–5.1)
Sodium: 135 mmol/L (ref 135–145)
Total Bilirubin: 0.7 mg/dL (ref 0.0–1.2)
Total Protein: 6.7 g/dL (ref 6.5–8.1)

## 2023-06-18 LAB — CBC
HCT: 47 % (ref 39.0–52.0)
Hemoglobin: 15.6 g/dL (ref 13.0–17.0)
MCH: 28.8 pg (ref 26.0–34.0)
MCHC: 33.2 g/dL (ref 30.0–36.0)
MCV: 86.9 fL (ref 80.0–100.0)
Platelets: 327 10*3/uL (ref 150–400)
RBC: 5.41 MIL/uL (ref 4.22–5.81)
RDW: 13.9 % (ref 11.5–15.5)
WBC: 8.4 10*3/uL (ref 4.0–10.5)
nRBC: 0 % (ref 0.0–0.2)

## 2023-06-18 LAB — ETHANOL: Alcohol, Ethyl (B): <10 mg/dL (ref <10)

## 2023-06-18 NOTE — ED Provider Triage Note (Signed)
Emergency Medicine Provider Triage Evaluation Note  Alejandro Baker , a 48 y.o. male  was evaluated in triage.  Pt complains of feeling tired.  Patient is quite somnolent not able to speak much more to me, says he has not slept in a while.  History of frequent visits, known with history of diabetes and elevated blood sugars, also a heavy alcohol consumption in the past  Review of Systems  Positive: Fatigue Negative: Vomiting, fever  Physical Exam  BP (!) 137/97 (BP Location: Left Arm)   Pulse 97   Temp 98.5 F (36.9 C)   Resp 16   SpO2 96%  Gen:   Sleepy, awakens to voice Resp:  Normal effort  MSK:   Moves extremities without difficulty    Medical Decision Making  Medically screening exam initiated at 10:56 PM.  Appropriate orders placed.  Alejandro Baker was informed that the remainder of the evaluation will be completed by another provider, this initial triage assessment does not replace that evaluation, and the importance of remaining in the ED until their evaluation is complete.  Patient is here generalized fatigue, sleeping, difficult to arouse and I suspect is likely multifactorial but may be related to homelessness.  We will check ethanol level and blood sugar levels for diabetes.  He is not having coo small respirations or evidence of acute hypercapnic acidosis per initial clinical exam, and is quite comfortable.   Terald Sleeper, MD 06/18/23 2258

## 2023-06-18 NOTE — ED Triage Notes (Signed)
Pt BIB by EMS from [place] station. Per EMS, pt reports "feeling sleepy." BGL 501. Hx of DM and noncompliant with medications. Pt denies any pain or other complaints.

## 2023-06-19 NOTE — ED Notes (Signed)
Pt threw down his blanket and left ED with steady gait.

## 2023-06-20 ENCOUNTER — Emergency Department (HOSPITAL_COMMUNITY)
Admission: EM | Admit: 2023-06-20 | Discharge: 2023-06-20 | Disposition: A | Discharge status: Home / Self Care | Payer: PPO

## 2023-06-20 ENCOUNTER — Encounter (HOSPITAL_COMMUNITY): Payer: Self-pay | Admitting: Emergency Medicine

## 2023-06-20 ENCOUNTER — Other Ambulatory Visit: Payer: Self-pay

## 2023-06-20 DIAGNOSIS — Z7951 Long term (current) use of inhaled steroids: Secondary | ICD-10-CM | POA: Insufficient documentation

## 2023-06-20 DIAGNOSIS — J45909 Unspecified asthma, uncomplicated: Secondary | ICD-10-CM | POA: Diagnosis not present

## 2023-06-20 DIAGNOSIS — E119 Type 2 diabetes mellitus without complications: Secondary | ICD-10-CM | POA: Insufficient documentation

## 2023-06-20 DIAGNOSIS — Z5900 Homelessness unspecified: Secondary | ICD-10-CM | POA: Insufficient documentation

## 2023-06-20 DIAGNOSIS — Z7984 Long term (current) use of oral hypoglycemic drugs: Secondary | ICD-10-CM | POA: Insufficient documentation

## 2023-06-20 DIAGNOSIS — M542 Cervicalgia: Secondary | ICD-10-CM | POA: Diagnosis not present

## 2023-06-20 MED ORDER — IBUPROFEN 600 MG PO TABS
600.0000 mg | ORAL_TABLET | Freq: Four times a day (QID) | ORAL | 0 refills | Status: DC | PRN
  Filled 2023-06-20: qty 30, 8d supply, fill #0

## 2023-06-20 MED ORDER — IBUPROFEN 800 MG PO TABS
800.0000 mg | ORAL_TABLET | Freq: Once | ORAL | Status: AC
  Administered 2023-06-20: 800 mg via ORAL
  Filled 2023-06-20: qty 1

## 2023-06-20 NOTE — ED Notes (Signed)
Patient verbalizes understanding of discharge instructions. Opportunity for questioning and answers were provided. Armband removed by staff, pt discharged from ED. Ambulated out to lobby  

## 2023-06-20 NOTE — ED Triage Notes (Signed)
BIB EMS from [place].  Cold.  Neck pain from injury years ago.

## 2023-06-20 NOTE — ED Provider Notes (Signed)
[place] EMERGENCY DEPARTMENT AT [facility] Provider Note   CSN: [id number] Arrival date & time: 06/20/23  2244     History  Chief Complaint  Patient presents with   Neck Pain    Alejandro Baker is a 48 y.o. male.  The history is provided by the patient and medical records. No language interpreter was used.  Neck Pain Associated symptoms: no fever      48 year old male history of schizoaffective disorder, diabetes, asthma, homelessness, brought here via EMS from his with complaint of neck pain.  Patient complaining of pain to the back of his neck.  This is chronic in nature.  Endorse increasing pain today.  Denies any numbness or weakness to his arms denies any recent injury denies any fever or chills.  Patient is well-known to the ER.  Home Medications Prior to Admission medications   Medication Sig Start Date End Date Taking? Authorizing Provider  acetaminophen (TYLENOL) 500 MG tablet Take 1 tablet (500 mg total) by mouth every 6 (six) hours as needed. 05/19/23   Fayrene Helper, PA-C  albuterol (VENTOLIN HFA) 108 (90 Base) MCG/ACT inhaler Inhale 1-2 puffs into the lungs every 6 (six) hours as needed for wheezing or shortness of breath. 05/19/21   Prosperi, Christian H, PA-C  azithromycin (ZITHROMAX Z-PAK) 250 MG tablet Take 2 tablets by mouth on day 1 then take 1 tablet once daily for 4 days. 05/19/23   Fayrene Helper, PA-C  bacitracin ointment Apply topically 2 times daily. 05/02/23   Arby Barrette, MD  benzonatate (TESSALON) 100 MG capsule Take 1 capsule (100 mg total) by mouth every 8 (eight) hours. 05/19/23   Fayrene Helper, PA-C  fluticasone (FLONASE) 50 MCG/ACT nasal spray Place 1 spray into both nostrils daily. 05/19/21   Prosperi, Christian H, PA-C  metFORMIN (GLUCOPHAGE) 500 MG tablet Take 1 tablet (500 mg total) by mouth 2 (two) times daily with a meal. 04/22/23   Dione Booze, MD  naproxen (NAPROSYN) 500 MG tablet Take 1 tablet (500 mg total) by mouth 2 (two) times daily  with a meal. 03/10/23   Rancour, Jeannett Senior, MD  ondansetron (ZOFRAN) 4 MG tablet Take 1 tablet (4 mg total) by mouth every 6 (six) hours as needed for nausea or vomiting. 04/09/23   Achille Rich, PA-C  risperiDONE (RISPERDAL) 2 MG tablet Take 1 tablet (2 mg total) by mouth at bedtime. 04/10/22 10/07/22  Alicia Amel, MD      Allergies    Penicillins and Povidone-iodine    Review of Systems   Review of Systems  Constitutional:  Negative for fever.  Musculoskeletal:  Positive for neck pain.  Skin:  Negative for wound.    Physical Exam Updated Vital Signs There were no vitals taken for this visit. Physical Exam Vitals and nursing note reviewed.  Constitutional:      General: He is not in acute distress.    Appearance: He is well-developed.  HENT:     Head: Atraumatic.  Eyes:     Conjunctiva/sclera: Conjunctivae normal.  Musculoskeletal:     Cervical back: Normal range of motion and neck supple. Tenderness (Mild tenderness to posterior neck without focal point tenderness overlying skin changes.  Neck with full range of motion.) present.  Skin:    Findings: No rash.  Neurological:     Mental Status: He is alert.     ED Results / Procedures / Treatments   Labs (all labs ordered are listed, but only abnormal results are displayed) Labs  Reviewed - No data to display  EKG None  Radiology No results found.  Procedures Procedures    Medications Ordered in ED Medications  ibuprofen (ADVIL) tablet 800 mg (has no administration in time range)    ED Course/ Medical Decision Making/ A&P                                 Medical Decision Making  BP (!) 149/93   Pulse 92   Temp 98.6 F (37 C) (Oral)   Resp 18   SpO2 100%   ,44:63 PM  48 year old male history of schizoaffective disorder, diabetes, asthma, homelessness, brought here via EMS from his with complaint of neck pain.  Patient complaining of pain to the back of his neck.  This is chronic in nature.  Endorse  increasing pain today.  Denies any numbness or weakness to his arms denies any recent injury denies any fever or chills.  Patient is well-known to the ER.  Exam overall reassuring, mild reproducible tenderness to posterior neck at the parous cervical spinal region without any significant focal point tenderness no crepitus no step-off no signs of infection neck with full range of motion.  Patient is well-known to the ED.  Pain is likely acute on chronic.  Vital signs overall reassuring.  Imaging including CT scan of the C-spine considered but not performed as I have low suspicion for acute fracture or dislocation as well as low suspicion for infectious etiology.  Ibuprofen given for pain with improvement of symptoms.  Patient stable for discharge.  Social determinant of health including tobacco use and unhoused.  Patient discharged home with ibuprofen.        Final Clinical Impression(s) / ED Diagnoses Final diagnoses:  Neck pain    Rx / DC Orders ED Discharge Orders          Ordered    ibuprofen (ADVIL) 600 MG tablet  Every 6 hours PRN        06/20/23 2323              Fayrene Helper, PA-C 06/20/23 2324    Durwin Glaze, MD 06/20/23 [phone]

## 2023-06-21 ENCOUNTER — Encounter (HOSPITAL_COMMUNITY): Payer: Self-pay | Admitting: Emergency Medicine

## 2023-06-21 ENCOUNTER — Other Ambulatory Visit (HOSPITAL_COMMUNITY): Payer: Self-pay

## 2023-06-21 ENCOUNTER — Emergency Department (HOSPITAL_COMMUNITY)
Admission: EM | Admit: 2023-06-21 | Discharge: 2023-06-22 | Disposition: A | Discharge status: Home / Self Care | Payer: PPO | Attending: Emergency Medicine | Admitting: Emergency Medicine

## 2023-06-21 ENCOUNTER — Other Ambulatory Visit: Payer: Self-pay

## 2023-06-21 ENCOUNTER — Emergency Department (HOSPITAL_COMMUNITY): Admission: EM | Admit: 2023-06-21 | Discharge: 2023-06-21 | Discharge status: Against Medical Advice | Payer: PPO

## 2023-06-21 DIAGNOSIS — S060X0A Concussion without loss of consciousness, initial encounter: Secondary | ICD-10-CM | POA: Diagnosis not present

## 2023-06-21 DIAGNOSIS — W228XXA Striking against or struck by other objects, initial encounter: Secondary | ICD-10-CM | POA: Insufficient documentation

## 2023-06-21 DIAGNOSIS — R400 Somnolence: Secondary | ICD-10-CM | POA: Insufficient documentation

## 2023-06-21 DIAGNOSIS — T7840XA Allergy, unspecified, initial encounter: Secondary | ICD-10-CM | POA: Diagnosis not present

## 2023-06-21 DIAGNOSIS — R519 Headache, unspecified: Secondary | ICD-10-CM | POA: Diagnosis not present

## 2023-06-21 DIAGNOSIS — I10 Essential (primary) hypertension: Secondary | ICD-10-CM | POA: Diagnosis not present

## 2023-06-21 DIAGNOSIS — J45909 Unspecified asthma, uncomplicated: Secondary | ICD-10-CM | POA: Diagnosis not present

## 2023-06-21 DIAGNOSIS — E119 Type 2 diabetes mellitus without complications: Secondary | ICD-10-CM | POA: Diagnosis not present

## 2023-06-21 DIAGNOSIS — Z5900 Homelessness unspecified: Secondary | ICD-10-CM | POA: Diagnosis not present

## 2023-06-21 DIAGNOSIS — R42 Dizziness and giddiness: Secondary | ICD-10-CM | POA: Insufficient documentation

## 2023-06-21 NOTE — ED Triage Notes (Signed)
Pt in with knot to R head, states there since a fall 5 wks ago. Pt states he feels drowsy and feels this is related

## 2023-06-21 NOTE — ED Notes (Signed)
 Pt not in lobby.

## 2023-06-22 ENCOUNTER — Emergency Department (HOSPITAL_COMMUNITY)
Admission: EM | Admit: 2023-06-22 | Discharge: 2023-06-23 | Disposition: A | Discharge status: Home / Self Care | Payer: PPO | Source: Home / Self Care | Attending: Emergency Medicine | Admitting: Emergency Medicine

## 2023-06-22 ENCOUNTER — Encounter (HOSPITAL_COMMUNITY): Payer: Self-pay

## 2023-06-22 DIAGNOSIS — R42 Dizziness and giddiness: Secondary | ICD-10-CM | POA: Diagnosis not present

## 2023-06-22 DIAGNOSIS — E119 Type 2 diabetes mellitus without complications: Secondary | ICD-10-CM | POA: Insufficient documentation

## 2023-06-22 DIAGNOSIS — Z5900 Homelessness unspecified: Secondary | ICD-10-CM | POA: Insufficient documentation

## 2023-06-22 DIAGNOSIS — J45909 Unspecified asthma, uncomplicated: Secondary | ICD-10-CM | POA: Insufficient documentation

## 2023-06-22 DIAGNOSIS — S060X0A Concussion without loss of consciousness, initial encounter: Secondary | ICD-10-CM | POA: Diagnosis not present

## 2023-06-22 DIAGNOSIS — R519 Headache, unspecified: Secondary | ICD-10-CM | POA: Insufficient documentation

## 2023-06-22 DIAGNOSIS — I10 Essential (primary) hypertension: Secondary | ICD-10-CM | POA: Insufficient documentation

## 2023-06-22 DIAGNOSIS — W19XXXA Unspecified fall, initial encounter: Secondary | ICD-10-CM | POA: Diagnosis not present

## 2023-06-22 DIAGNOSIS — G44219 Episodic tension-type headache, not intractable: Secondary | ICD-10-CM | POA: Diagnosis not present

## 2023-06-22 MED ORDER — ACETAMINOPHEN 325 MG PO TABS
650.0000 mg | ORAL_TABLET | Freq: Once | ORAL | Status: AC
Start: 2023-06-22 — End: 2023-06-22
  Administered 2023-06-22: 650 mg via ORAL
  Filled 2023-06-22: qty 2

## 2023-06-22 MED ORDER — PROCHLORPERAZINE MALEATE 10 MG PO TABS
10.0000 mg | ORAL_TABLET | Freq: Once | ORAL | Status: AC
  Administered 2023-06-22: 10 mg via ORAL
  Filled 2023-06-22: qty 1

## 2023-06-22 MED ORDER — ACETAMINOPHEN 500 MG PO TABS
1000.0000 mg | ORAL_TABLET | Freq: Once | ORAL | Status: DC
Start: 2023-06-22 — End: 2023-06-23

## 2023-06-22 NOTE — Discharge Instructions (Signed)
 Get help right away if: You have a severe or worsening headache. You have weakness or numbness in any part of your body, slurred speech, vision changes, or confusion. You vomit repeatedly. You lose consciousness, are sleepier than normal, or are difficult to wake up. You have a seizure. These symptoms may be an emergency. Get help right away. Call 911. Do not wait to see if the symptoms will go away. Do not drive yourself to the hospital. Also, get help right away if: You have thoughts of hurting yourself or others.

## 2023-06-22 NOTE — ED Provider Notes (Signed)
 Emergency Department Provider Note   I have reviewed the triage vital signs and the nursing notes.   HISTORY  Chief Complaint No chief complaint on file.   HPI Alejandro Baker is a 48 y.o. male with past history reviewed below presents emergency department with complaint of headache and burning sensation over his body.  In triage she described a burn to the left hip that he denies any actual burn.  He will not let me examine the area that he initially identified in triage and tells me it is more of a burning sensation diffusely.  He has 80+ visits to the emergency department in 6 months with known history of mental health issues, housing instability, malingering. Denies any SI/HI.    Past Medical History:  Diagnosis Date   Asthma    DM (diabetes mellitus) (HCC)    Homeless    Hypertension    Neuropathy    Schizoaffective disorder, bipolar type (HCC)     Review of Systems  Constitutional: No fever/chills Cardiovascular: Denies chest pain. Respiratory: Denies shortness of breath. Gastrointestinal: No abdominal pain.  No nausea, no vomiting.  Skin: Negative for rash. Neurological: Positive HA.   ____________________________________________   PHYSICAL EXAM:  VITAL SIGNS: ED Triage Vitals  Encounter Vitals Group     BP 06/22/23 2259 (!) 143/78     Pulse Rate 06/22/23 2259 (!) 108     Resp 06/22/23 2259 16     Temp 06/22/23 2259 98.7 F (37.1 C)     Temp Source 06/22/23 2259 Oral     SpO2 06/22/23 2259 92 %   Constitutional: Alert and oriented. Well appearing and in no acute distress. Eyes: Conjunctivae are normal. Head: Atraumatic. Nose: No congestion/rhinnorhea. Mouth/Throat: Mucous membranes are moist.  Neck: No stridor.  Cardiovascular: Good peripheral circulation. Respiratory: Normal respiratory effort.   Gastrointestinal: No distention.  Musculoskeletal: No gross deformities of extremities. Neurologic:  Normal speech and language. No gross focal  neurologic deficits are appreciated.  Skin:  Skin is warm, dry and intact. No rash noted.  ____________________________________________   PROCEDURES  Procedure(s) performed:   Procedures  None  ____________________________________________   INITIAL IMPRESSION / ASSESSMENT AND PLAN / ED COURSE  Pertinent labs & imaging results that were available during my care of the patient were reviewed by me and considered in my medical decision making (see chart for details).   This patient is Presenting for Evaluation of HA, which does require a range of treatment options, and is a complaint that involves a high risk of morbidity and mortality.  The Differential Diagnoses includes but is not exclusive to subarachnoid hemorrhage, meningitis, encephalitis, previous head trauma, cavernous venous thrombosis, muscle tension headache, glaucoma, temporal arteritis, migraine or migraine equivalent, etc.   Critical Interventions-    Medications  acetaminophen (TYLENOL) tablet 1,000 mg (has no administration in time range)    Social Determinants of Health Risk Homeless.  Medical Decision Making: Summary:  Patient presents emergency department with mild headache and reported burning sensation all over.  No specific burn or rash noted but patient is not cooperating with full exam.  I have low suspicion for serious thermal or chemical burn. Patient is in the ED almost nightly with similar complaints and struggles with multiple social issues. Will give Tylenol for HA. No AMS. No outward sign of new trauma. Plan for food and d/c.   Patient's presentation is most consistent with exacerbation of chronic illness.   Disposition: discharge  ____________________________________________  FINAL CLINICAL IMPRESSION(S) /  ED DIAGNOSES  Final diagnoses:  Acute nonintractable headache, unspecified headache type    Note:  This document was prepared using Dragon voice recognition software and may include  unintentional dictation errors.  Alona Bene, MD, [facility] Emergency Medicine    Nishawn Rotan, Arlyss Repress, MD 06/22/23 [phone]

## 2023-06-22 NOTE — ED Provider Notes (Signed)
 [place] EMERGENCY DEPARTMENT AT [facility] Provider Note   CSN: [id number] Arrival date & time: 06/21/23  2306     History  Chief Complaint  Patient presents with   Knot on Head    Alejandro Baker is a 48 y.o. male who presents emergency department for dizziness.  This is the patient's 88 visit in the last 6 months.  Patient reports he hit his head a few days ago and has been feeling dizzy and sleepy.  He denies nausea vomiting or vision changes.  HPI     Home Medications Prior to Admission medications   Medication Sig Start Date End Date Taking? Authorizing Provider  acetaminophen (TYLENOL) 500 MG tablet Take 1 tablet (500 mg total) by mouth every 6 (six) hours as needed. 05/19/23   Fayrene Helper, PA-C  albuterol (VENTOLIN HFA) 108 (90 Base) MCG/ACT inhaler Inhale 1-2 puffs into the lungs every 6 (six) hours as needed for wheezing or shortness of breath. 05/19/21   Prosperi, Christian H, PA-C  azithromycin (ZITHROMAX Z-PAK) 250 MG tablet Take 2 tablets by mouth on day 1 then take 1 tablet once daily for 4 days. 05/19/23   Fayrene Helper, PA-C  bacitracin ointment Apply topically 2 times daily. 05/02/23   Arby Barrette, MD  benzonatate (TESSALON) 100 MG capsule Take 1 capsule (100 mg total) by mouth every 8 (eight) hours. 05/19/23   Fayrene Helper, PA-C  fluticasone (FLONASE) 50 MCG/ACT nasal spray Place 1 spray into both nostrils daily. 05/19/21   Prosperi, Christian H, PA-C  ibuprofen (ADVIL) 600 MG tablet Take 1 tablet (600 mg total) by mouth every 6 (six) hours as needed. 06/20/23   Fayrene Helper, PA-C  metFORMIN (GLUCOPHAGE) 500 MG tablet Take 1 tablet (500 mg total) by mouth 2 (two) times daily with a meal. 04/22/23   Dione Booze, MD  naproxen (NAPROSYN) 500 MG tablet Take 1 tablet (500 mg total) by mouth 2 (two) times daily with a meal. 03/10/23   Rancour, Jeannett Senior, MD  ondansetron (ZOFRAN) 4 MG tablet Take 1 tablet (4 mg total) by mouth every 6 (six) hours as needed for nausea  or vomiting. 04/09/23   Achille Rich, PA-C  risperiDONE (RISPERDAL) 2 MG tablet Take 1 tablet (2 mg total) by mouth at bedtime. 04/10/22 10/07/22  Alicia Amel, MD      Allergies    Penicillins and Povidone-iodine    Review of Systems   Review of Systems  Physical Exam Updated Vital Signs BP (!) 131/95 (BP Location: Right Arm)   Pulse (!) 106   Temp (!) 97.3 F (36.3 C) (Oral)   Resp 16   Wt 88 kg   SpO2 96%   BMI 26.31 kg/m  Physical Exam Vitals and nursing note reviewed.  Constitutional:      General: He is not in acute distress.    Appearance: He is well-developed. He is not diaphoretic.  HENT:     Head: Normocephalic and atraumatic.  Eyes:     General: No scleral icterus.    Conjunctiva/sclera: Conjunctivae normal.  Cardiovascular:     Rate and Rhythm: Normal rate and regular rhythm.     Heart sounds: Normal heart sounds.  Pulmonary:     Effort: Pulmonary effort is normal. No respiratory distress.     Breath sounds: Normal breath sounds.  Abdominal:     Palpations: Abdomen is soft.     Tenderness: There is no abdominal tenderness.  Musculoskeletal:     Cervical back: Normal  range of motion and neck supple.  Skin:    General: Skin is warm and dry.  Neurological:     Mental Status: He is alert.     Comments: Speech is clear and goal oriented, follows commands Major Cranial nerves without deficit, no facial droop Normal strength in upper and lower extremities bilaterally including dorsiflexion and plantar flexion, strong and equal grip strength Sensation normal to light and sharp touch Moves extremities without ataxia, coordination intact Normal finger to nose and rapid alternating movements Neg romberg, no pronator drift Normal gait Normal heel-shin and balance    Psychiatric:        Behavior: Behavior normal.     ED Results / Procedures / Treatments   Labs (all labs ordered are listed, but only abnormal results are displayed) Labs Reviewed - No  data to display  EKG None  Radiology No results found.  Procedures Procedures    Medications Ordered in ED Medications - No data to display  ED Course/ Medical Decision Making/ A&P                                 Medical Decision Making  Patient here with complaints of dizziness sleepiness mild headache after hitting his head a few days ago.  Neurologic exam is within normal limits.  Suspect potential concussion. Patient given Compazine and Tylenol.  Appropriate for discharge at this time.        Final Clinical Impression(s) / ED Diagnoses Final diagnoses:  None    Rx / DC Orders ED Discharge Orders     None         Arthor Captain, PA-C 06/22/23 Barbra Sarks, April, MD 06/22/23 [phone]
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
## 2023-06-22 NOTE — Discharge Instructions (Signed)

## 2023-06-22 NOTE — ED Notes (Signed)
Patient verbalizes understanding of discharge instructions. Opportunity for questioning and answers were provided. Armband removed by staff, pt discharged from ED. Ambulated out to lobby  

## 2023-06-23 ENCOUNTER — Encounter (HOSPITAL_COMMUNITY): Payer: Self-pay | Admitting: Emergency Medicine

## 2023-06-23 ENCOUNTER — Other Ambulatory Visit: Payer: Self-pay

## 2023-06-23 ENCOUNTER — Emergency Department (HOSPITAL_COMMUNITY)
Admission: EM | Admit: 2023-06-23 | Discharge: 2023-06-24 | Discharge status: Against Medical Advice | Payer: PPO | Attending: Emergency Medicine | Admitting: Emergency Medicine

## 2023-06-23 DIAGNOSIS — F17210 Nicotine dependence, cigarettes, uncomplicated: Secondary | ICD-10-CM | POA: Diagnosis not present

## 2023-06-23 DIAGNOSIS — Z59819 Housing instability, housed unspecified: Secondary | ICD-10-CM | POA: Diagnosis not present

## 2023-06-23 DIAGNOSIS — J3489 Other specified disorders of nose and nasal sinuses: Secondary | ICD-10-CM | POA: Diagnosis not present

## 2023-06-23 DIAGNOSIS — E1140 Type 2 diabetes mellitus with diabetic neuropathy, unspecified: Secondary | ICD-10-CM | POA: Diagnosis not present

## 2023-06-23 DIAGNOSIS — R519 Headache, unspecified: Secondary | ICD-10-CM | POA: Diagnosis not present

## 2023-06-23 DIAGNOSIS — I10 Essential (primary) hypertension: Secondary | ICD-10-CM | POA: Diagnosis not present

## 2023-06-23 DIAGNOSIS — Z5321 Procedure and treatment not carried out due to patient leaving prior to being seen by health care provider: Secondary | ICD-10-CM | POA: Insufficient documentation

## 2023-06-23 DIAGNOSIS — W01198A Fall on same level from slipping, tripping and stumbling with subsequent striking against other object, initial encounter: Secondary | ICD-10-CM | POA: Diagnosis not present

## 2023-06-23 DIAGNOSIS — J45909 Unspecified asthma, uncomplicated: Secondary | ICD-10-CM | POA: Diagnosis not present

## 2023-06-23 NOTE — ED Triage Notes (Signed)
 Patient had to check in because he was sitting in the lobby and was told he had to check in or leave. Patient c/o headache from hitting head 3 days ago.

## 2023-06-24 ENCOUNTER — Emergency Department (HOSPITAL_BASED_OUTPATIENT_CLINIC_OR_DEPARTMENT_OTHER): Payer: PPO

## 2023-06-24 ENCOUNTER — Encounter (HOSPITAL_BASED_OUTPATIENT_CLINIC_OR_DEPARTMENT_OTHER): Payer: Self-pay | Admitting: Emergency Medicine

## 2023-06-24 DIAGNOSIS — F17210 Nicotine dependence, cigarettes, uncomplicated: Secondary | ICD-10-CM | POA: Insufficient documentation

## 2023-06-24 DIAGNOSIS — F10129 Alcohol abuse with intoxication, unspecified: Secondary | ICD-10-CM | POA: Diagnosis not present

## 2023-06-24 DIAGNOSIS — R519 Headache, unspecified: Secondary | ICD-10-CM | POA: Insufficient documentation

## 2023-06-24 DIAGNOSIS — W01198A Fall on same level from slipping, tripping and stumbling with subsequent striking against other object, initial encounter: Secondary | ICD-10-CM | POA: Insufficient documentation

## 2023-06-24 DIAGNOSIS — E1140 Type 2 diabetes mellitus with diabetic neuropathy, unspecified: Secondary | ICD-10-CM | POA: Insufficient documentation

## 2023-06-24 DIAGNOSIS — R739 Hyperglycemia, unspecified: Secondary | ICD-10-CM | POA: Diagnosis not present

## 2023-06-24 DIAGNOSIS — Z59819 Housing instability, housed unspecified: Secondary | ICD-10-CM | POA: Insufficient documentation

## 2023-06-24 DIAGNOSIS — I10 Essential (primary) hypertension: Secondary | ICD-10-CM | POA: Insufficient documentation

## 2023-06-24 DIAGNOSIS — J45909 Unspecified asthma, uncomplicated: Secondary | ICD-10-CM | POA: Insufficient documentation

## 2023-06-24 MED ORDER — ACETAMINOPHEN 500 MG PO TABS
1000.0000 mg | ORAL_TABLET | Freq: Once | ORAL | Status: AC
  Administered 2023-06-24: 1000 mg via ORAL
  Filled 2023-06-24: qty 2

## 2023-06-24 NOTE — ED Triage Notes (Addendum)
 C/o headache. Front of head, right side Report falling and hitting head about 1 month ago. Reports some drowsiness in middle of the day Seen for similar last night at [facility] Talking to self  Not forthcoming with information  PA evaluated pt in triage

## 2023-06-24 NOTE — ED Provider Notes (Signed)
  [facility]-EMERGENCY DEPT [facility] Emergency Department Provider Note MRN:  [id number]  Arrival date & time: 06/24/23     Chief Complaint   Headache   History of Present Illness   Alejandro Baker is a 48 y.o. year-old male presents to the ED with chief complaint of headache.  He was in the lobby and was told that he needed to leave.  He decided to check in instead.  He is well known to the department and has been seen 90 times in the past 6 months with 0 admissions.  He suffers with housing instability. .  History provided by patient.   Review of Systems  Pertinent positive and negative review of systems noted in HPI.    Physical Exam   Vitals:   06/23/23 2321  BP: (!) 152/123  Pulse: 94  Resp: 17  Temp: 98.1 F (36.7 C)  SpO2: 96%    CONSTITUTIONAL:  non toxic-appearing, NAD NEURO: Sleeping EYES:  eyes equal and reactive ENT/NECK:  Supple, no stridor  CARDIO:  normal rate, appears well-perfused  PULM:  No respiratory distress, GI/GU:  non-distended MSK/SPINE:  No gross deformities, no edema, moves all extremities  SKIN:  no rash, atraumatic   *Additional and/or pertinent findings included in MDM below  Diagnostic and Interventional Summary    EKG Interpretation Date/Time:    Ventricular Rate:    PR Interval:    QRS Duration:    QT Interval:    QTC Calculation:   R Axis:      Text Interpretation:         Labs Reviewed - No data to display  No orders to display    Medications - No data to display   Procedures  /  Critical Care Procedures  ED Course and Medical Decision Making  I have reviewed the triage vital signs, the nursing notes, and pertinent available records from the EMR.  Social Determinants Affecting Complexity of Care: Patient has no clinically significant social determinants affecting this chief complaint..   ED Course:    Medical Decision Making        Consultants: No consultations were needed in caring for this  patient.   Treatment and Plan: Patient eloped.     Final Clinical Impressions(s) / ED Diagnoses     ICD-10-CM   1. Housing instability  Z59.819       ED Discharge Orders     None         Discharge Instructions Discussed with and Provided to Patient:   Discharge Instructions   None      Felipa Furnace 06/24/23 [id number]    Tilden Fossa, MD 06/25/23 [phone]

## 2023-06-24 NOTE — ED Notes (Signed)
 Pt continuously cussing out loud to nurses at other patients passing by.  Pt has been told multiple times stop cussing.

## 2023-06-25 ENCOUNTER — Other Ambulatory Visit: Payer: Self-pay

## 2023-06-25 ENCOUNTER — Emergency Department (HOSPITAL_BASED_OUTPATIENT_CLINIC_OR_DEPARTMENT_OTHER)
Admission: EM | Admit: 2023-06-25 | Discharge: 2023-06-25 | Disposition: A | Discharge status: Home / Self Care | Payer: PPO | Source: Home / Self Care | Attending: Emergency Medicine | Admitting: Emergency Medicine

## 2023-06-25 ENCOUNTER — Emergency Department (HOSPITAL_BASED_OUTPATIENT_CLINIC_OR_DEPARTMENT_OTHER)
Admission: EM | Admit: 2023-06-25 | Discharge: 2023-06-25 | Disposition: A | Discharge status: Home / Self Care | Payer: PPO | Attending: Emergency Medicine | Admitting: Emergency Medicine

## 2023-06-25 ENCOUNTER — Encounter (HOSPITAL_BASED_OUTPATIENT_CLINIC_OR_DEPARTMENT_OTHER): Payer: Self-pay | Admitting: Emergency Medicine

## 2023-06-25 DIAGNOSIS — R519 Headache, unspecified: Secondary | ICD-10-CM

## 2023-06-25 DIAGNOSIS — Z59819 Housing instability, housed unspecified: Secondary | ICD-10-CM

## 2023-06-25 DIAGNOSIS — Z4800 Encounter for change or removal of nonsurgical wound dressing: Secondary | ICD-10-CM | POA: Diagnosis not present

## 2023-06-25 DIAGNOSIS — Z4801 Encounter for change or removal of surgical wound dressing: Secondary | ICD-10-CM | POA: Diagnosis not present

## 2023-06-25 DIAGNOSIS — Z5900 Homelessness unspecified: Secondary | ICD-10-CM | POA: Diagnosis not present

## 2023-06-25 DIAGNOSIS — Z5189 Encounter for other specified aftercare: Secondary | ICD-10-CM

## 2023-06-25 MED ORDER — BACITRACIN-NEOMYCIN-POLYMYXIN OINTMENT TUBE: TOPICAL_OINTMENT | Freq: Every day | CUTANEOUS | Status: DC

## 2023-06-25 MED ORDER — IBUPROFEN 400 MG PO TABS: 600.0000 mg | ORAL_TABLET | Freq: Once | ORAL | Status: DC

## 2023-06-25 MED ORDER — BACITRACIN ZINC 500 UNIT/GM EX OINT: TOPICAL_OINTMENT | Freq: Every day | CUTANEOUS | Status: DC

## 2023-06-25 MED ORDER — BACITRACIN ZINC 500 UNIT/GM EX OINT: TOPICAL_OINTMENT | Freq: Two times a day (BID) | CUTANEOUS | Status: DC

## 2023-06-25 NOTE — Discharge Instructions (Signed)
 You were evaluated in the Emergency Department and after careful evaluation, we did not find any emergent condition requiring admission or further testing in the hospital.  Your exam/testing today is overall reassuring.  CT head did not show any abnormalities.  Please return to the Emergency Department if you experience any worsening of your condition.   Thank you for allowing [place] to be a part of your care.

## 2023-06-25 NOTE — ED Notes (Signed)
 Pt left prior to meds and d/c papers.
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
## 2023-06-25 NOTE — ED Notes (Signed)
 Patient refused discharge vitals.

## 2023-06-25 NOTE — ED Provider Notes (Signed)
 [facility] Emergency Department Provider Note MRN:  [id number]  Arrival date & time: 06/25/23     Chief Complaint   Fall   History of Present Illness   Alejandro Baker is a 48 y.o. year-old male with a history of hypertension, diabetes presenting to the ED with chief complaint of fall.  Reportedly fell and hit head a month ago, reports feeling drowsy.  Review of Systems  A thorough review of systems was obtained and all systems are negative except as noted in the HPI and PMH.   Patient's Health History    Past Medical History:  Diagnosis Date   Asthma    DM (diabetes mellitus) (HCC)    Homeless    Hypertension    Neuropathy    Schizoaffective disorder, bipolar type (HCC)     History reviewed. No pertinent surgical history.  History reviewed. No pertinent family history.  Social History   Socioeconomic History   Marital status: Single    Spouse name: Not on file   Number of children: Not on file   Years of education: Not on file   Highest education level: Not on file  Occupational History   Not on file  Tobacco Use   Smoking status: Some Days    Current packs/day: 0.15    Average packs/day: 0.2 packs/day for 15.0 years (2.3 ttl pk-yrs)    Types: Cigarettes   Smokeless tobacco: Never  Vaping Use   Vaping status: Never Used  Substance and Sexual Activity   Alcohol use: Yes    Comment: occ   Drug use: Yes    Types: Cocaine   Sexual activity: Not on file  Other Topics Concern   Not on file  Social History Narrative   ** Merged History Encounter **       ** Merged History Encounter **       ** Merged History Encounter **       Social Drivers of Corporate investment banker Strain: Not on file  Food Insecurity: Not on file  Transportation Needs: Not on file  Physical Activity: Not on file  Stress: Not on file  Social Connections: Not on file  Intimate Partner Violence: Not on file     Physical Exam   Vitals:   06/24/23  2153  BP: (!) 144/92  Pulse: 97  Resp: 16  Temp: 98.2 F (36.8 C)  SpO2: 97%    CONSTITUTIONAL: Chronically ill-appearing, NAD NEURO/PSYCH: Sleeping, nonparticipatory with exam. EYES:  eyes equal and reactive ENT/NECK:  no LAD, no JVD CARDIO: Regular rate, well-perfused, normal S1 and S2 PULM:  CTAB no wheezing or rhonchi GI/GU:  non-distended, non-tender MSK/SPINE:  No gross deformities, no edema SKIN:  no rash, atraumatic   *Additional and/or pertinent findings included in MDM below  Diagnostic and Interventional Summary    EKG Interpretation Date/Time:    Ventricular Rate:    PR Interval:    QRS Duration:    QT Interval:    QTC Calculation:   R Axis:      Text Interpretation:         Labs Reviewed - No data to display  CT Head Wo Contrast  Final Result      Medications  acetaminophen (TYLENOL) tablet 1,000 mg (1,000 mg Oral Given 06/24/23 2208)     Procedures  /  Critical Care Procedures  ED Course and Medical Decision Making  Initial Impression and Ddx Patient seems to be aware of my presence but  will not really roll over from his comfortable sleeping position to talk to me.  He has had multiple multiple ED visits this year all in the setting of housing instability.  His vitals are normal.  Overall doubt emergent process.  Was in the emergency department only a few hours ago with similar complaint.  Past medical/surgical history that increases complexity of ED encounter: Schizoaffective disorder  Interpretation of Diagnostics CT head without acute process  Patient Reassessment and Ultimate Disposition/Management     No emergent process appropriate for discharge.  Patient management required discussion with the following services or consulting groups:  None  Complexity of Problems Addressed Acute illness or injury that poses threat of life of bodily function  Additional Data Reviewed and Analyzed Further history obtained from: Past medical  history and medications listed in the EMR, Prior ED visit notes, and Prior labs/imaging results  Additional Factors Impacting ED Encounter Risk None  Elmer Sow. Pilar Plate, MD [facility] Emergency Medicine [facility] [email or web address]  Final Clinical Impressions(s) / ED Diagnoses     ICD-10-CM   1. Nonintractable headache, unspecified chronicity pattern, unspecified headache type  R51.9     2. Housing instability  [phone]       ED Discharge Orders     None        Discharge Instructions Discussed with and Provided to Patient:    Discharge Instructions      You were evaluated in the Emergency Department and after careful evaluation, we did not find any emergent condition requiring admission or further testing in the hospital.  Your exam/testing today is overall reassuring.  CT head did not show any abnormalities.  Please return to the Emergency Department if you experience any worsening of your condition.   Thank you for allowing [place] to be a part of your care.      Sabas Sous, MD 06/25/23 [phone]

## 2023-06-25 NOTE — Discharge Instructions (Signed)
 You were evaluated in the emergency room for wound check.  This wound appears that is healing well without any signs of infection.  You were given Neosporin and ibuprofen.  He was provided resources to establish with a primary care doctor.  If you experience any new or worsening symptoms please return to the emergency room.

## 2023-06-25 NOTE — ED Notes (Signed)
 Unable to locate in department when called for triage.

## 2023-06-25 NOTE — ED Triage Notes (Signed)
 Had old wound on left side of head, asking for it to get checked out and he he should put neosporin on it

## 2023-06-26 ENCOUNTER — Encounter (HOSPITAL_COMMUNITY): Payer: Self-pay

## 2023-06-26 ENCOUNTER — Emergency Department (HOSPITAL_COMMUNITY)
Admission: EM | Admit: 2023-06-26 | Discharge: 2023-06-26 | Disposition: A | Discharge status: Home / Self Care | Payer: PPO | Attending: Emergency Medicine | Admitting: Emergency Medicine

## 2023-06-26 ENCOUNTER — Other Ambulatory Visit: Payer: Self-pay

## 2023-06-26 ENCOUNTER — Emergency Department (HOSPITAL_COMMUNITY)
Admission: EM | Admit: 2023-06-26 | Discharge: 2023-06-27 | Disposition: A | Discharge status: Home / Self Care | Payer: PPO | Source: Home / Self Care | Attending: Emergency Medicine | Admitting: Emergency Medicine

## 2023-06-26 DIAGNOSIS — J45909 Unspecified asthma, uncomplicated: Secondary | ICD-10-CM | POA: Diagnosis not present

## 2023-06-26 DIAGNOSIS — R739 Hyperglycemia, unspecified: Secondary | ICD-10-CM | POA: Diagnosis not present

## 2023-06-26 DIAGNOSIS — R519 Headache, unspecified: Secondary | ICD-10-CM | POA: Insufficient documentation

## 2023-06-26 DIAGNOSIS — I10 Essential (primary) hypertension: Secondary | ICD-10-CM | POA: Diagnosis not present

## 2023-06-26 DIAGNOSIS — Z5900 Homelessness unspecified: Secondary | ICD-10-CM | POA: Insufficient documentation

## 2023-06-26 DIAGNOSIS — Z7951 Long term (current) use of inhaled steroids: Secondary | ICD-10-CM | POA: Insufficient documentation

## 2023-06-26 DIAGNOSIS — E119 Type 2 diabetes mellitus without complications: Secondary | ICD-10-CM | POA: Insufficient documentation

## 2023-06-26 DIAGNOSIS — M545 Low back pain, unspecified: Secondary | ICD-10-CM | POA: Insufficient documentation

## 2023-06-26 DIAGNOSIS — R456 Violent behavior: Secondary | ICD-10-CM | POA: Diagnosis not present

## 2023-06-26 DIAGNOSIS — R4689 Other symptoms and signs involving appearance and behavior: Secondary | ICD-10-CM | POA: Insufficient documentation

## 2023-06-26 DIAGNOSIS — Z59819 Housing instability, housed unspecified: Secondary | ICD-10-CM | POA: Insufficient documentation

## 2023-06-26 DIAGNOSIS — J00 Acute nasopharyngitis [common cold]: Secondary | ICD-10-CM | POA: Diagnosis not present

## 2023-06-26 DIAGNOSIS — Z7984 Long term (current) use of oral hypoglycemic drugs: Secondary | ICD-10-CM | POA: Insufficient documentation
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
## 2023-06-27 ENCOUNTER — Emergency Department (HOSPITAL_COMMUNITY)
Admission: EM | Admit: 2023-06-27 | Discharge: 2023-06-27 | Disposition: A | Discharge status: Home / Self Care | Payer: PPO | Attending: Emergency Medicine | Admitting: Emergency Medicine

## 2023-06-27 ENCOUNTER — Emergency Department (HOSPITAL_COMMUNITY)
Admission: EM | Admit: 2023-06-27 | Discharge: 2023-06-28 | Disposition: A | Discharge status: Home / Self Care | Payer: PPO | Source: Home / Self Care | Attending: Emergency Medicine | Admitting: Emergency Medicine

## 2023-06-27 ENCOUNTER — Other Ambulatory Visit: Payer: Self-pay

## 2023-06-27 ENCOUNTER — Encounter (HOSPITAL_COMMUNITY): Payer: Self-pay

## 2023-06-27 DIAGNOSIS — M79602 Pain in left arm: Secondary | ICD-10-CM | POA: Insufficient documentation

## 2023-06-27 DIAGNOSIS — T1490XD Injury, unspecified, subsequent encounter: Secondary | ICD-10-CM | POA: Diagnosis not present

## 2023-06-27 DIAGNOSIS — Z5900 Homelessness unspecified: Secondary | ICD-10-CM | POA: Insufficient documentation

## 2023-06-27 DIAGNOSIS — M79601 Pain in right arm: Secondary | ICD-10-CM | POA: Insufficient documentation

## 2023-06-27 DIAGNOSIS — W07XXXD Fall from chair, subsequent encounter: Secondary | ICD-10-CM | POA: Diagnosis not present

## 2023-06-27 DIAGNOSIS — S0180XD Unspecified open wound of other part of head, subsequent encounter: Secondary | ICD-10-CM | POA: Diagnosis not present

## 2023-06-27 DIAGNOSIS — R519 Headache, unspecified: Secondary | ICD-10-CM | POA: Diagnosis not present

## 2023-06-27 MED ORDER — TRIPLE ANTIBIOTIC 3.5-400-5000 EX OINT
TOPICAL_OINTMENT | Freq: Once | CUTANEOUS | Status: AC
  Filled 2023-06-27: qty 1

## 2023-06-27 MED ORDER — IBUPROFEN 800 MG PO TABS
800.0000 mg | ORAL_TABLET | Freq: Once | ORAL | Status: AC
  Administered 2023-06-27: 800 mg via ORAL
  Filled 2023-06-27: qty 1
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
## 2023-06-27 NOTE — ED Notes (Signed)
 Pt refused d/c vitals. Pt required to be escorted out by security and [employer].

## 2023-06-27 NOTE — ED Notes (Signed)
 Pt went outside when name was called

## 2023-06-27 NOTE — Discharge Instructions (Signed)
 Thank you for letting us evaluate you today.  We provided you with Neosporin for your head wound.  It appears to be healing well and is not infectious at this time.  I provided you with ibuprofen for mild headache  Return to emergency department if you experience altered mentation, seizures, visual disturbances, worsening of symptoms

## 2023-06-27 NOTE — ED Provider Triage Note (Signed)
 Emergency Medicine Provider Triage Evaluation Note  Alejandro Baker , a 48 y.o. male  was evaluated in triage.  Pt complains of R arm pain.  Review of Systems  Positive: pain Negative: Injury, numbness, wound, weakness  Physical Exam  BP (!) 138/102   Pulse (!) 102   Temp 99 F (37.2 C) (Oral)   Resp 18   SpO2 100%  Gen:   Awake, no distress   Resp:  Normal effort  MSK:   Moves extremities without difficulty  Other:    Medical Decision Making  Medically screening exam initiated at 2:28 PM.  Appropriate orders placed.  Cire Clute was informed that the remainder of the evaluation will be completed by another provider, this initial triage assessment does not replace that evaluation, and the importance of remaining in the ED until their evaluation is complete.     Dolphus Jenny, PA-C 06/27/23 1428

## 2023-06-27 NOTE — ED Provider Notes (Signed)
  [facility]-EMERGENCY DEPT [facility] Emergency Department Provider Note MRN:  [id number]  Arrival date & time: 06/27/23     Chief Complaint   Head Injury   History of Present Illness   Alejandro Baker is a 48 y.o. year-old male presents to the ED with chief complaint of headache.  He is seen almost daily.  He also reports som low back pain.  He denies any new complaints.  History provided by patient.   Review of Systems  Pertinent positive and negative review of systems noted in HPI.    Physical Exam   Vitals:   06/26/23 2218 06/27/23 0202  BP: (!) 119/95 124/79  Pulse: (!) 120 94  Resp: 18 18  Temp: 98.3 F (36.8 C) 97.7 F (36.5 C)  SpO2: 96% 99%    CONSTITUTIONAL:  non toxic-appearing, NAD NEURO:  Alert and oriented x 3, CN 3-12 grossly intact EYES:  eyes equal and reactive ENT/NECK:  Supple, no stridor  CARDIO:  normal rate on my exam, appears well-perfused  PULM:  No respiratory distress,  GI/GU:  non-distended,  MSK/SPINE:  No gross deformities, no edema, moves all extremities  SKIN:  no rash, atraumatic   *Additional and/or pertinent findings included in MDM below  Diagnostic and Interventional Summary    EKG Interpretation Date/Time:    Ventricular Rate:    PR Interval:    QRS Duration:    QT Interval:    QTC Calculation:   R Axis:      Text Interpretation:         Labs Reviewed - No data to display  No orders to display    Medications - No data to display   Procedures  /  Critical Care Procedures  ED Course and Medical Decision Making  I have reviewed the triage vital signs, the nursing notes, and pertinent available records from the EMR.  Social Determinants Affecting Complexity of Care: Patient is homelessness.   ED Course:    Medical Decision Making   Patient appears in his normal state of health.  I don't think he needs emergent workup tonight.     Consultants: No consultations were needed in caring for this  patient.   Treatment and Plan: Emergency department workup does not suggest an emergent condition requiring admission or immediate intervention beyond  what has been performed at this time. The patient is safe for discharge and has  been instructed to return immediately for worsening symptoms, change in  symptoms or any other concerns    Final Clinical Impressions(s) / ED Diagnoses     ICD-10-CM   1. Housing instability  Z59.819       ED Discharge Orders     None         Discharge Instructions Discussed with and Provided to Patient:   Discharge Instructions   None      Roxy Horseman, PA-C 06/27/23 0457    Palumbo, April, MD 06/27/23 [phone]

## 2023-06-28 DIAGNOSIS — S0990XA Unspecified injury of head, initial encounter: Secondary | ICD-10-CM | POA: Diagnosis not present

## 2023-06-28 DIAGNOSIS — W228XXA Striking against or struck by other objects, initial encounter: Secondary | ICD-10-CM | POA: Diagnosis not present

## 2023-06-28 NOTE — ED Provider Notes (Signed)
 [place] EMERGENCY DEPARTMENT AT [facility] Provider Note   CSN: [id number] Arrival date & time: 06/27/23  2006     History  Chief Complaint  Patient presents with   Arm Pain    Riyad Keena is a 48 y.o. male.  The history is provided by the patient and medical records.  Arm Pain   48 year old male with history of substance abuse, schizophrenia, homelessness, presenting to the ED with bilateral arm pain.  Denies any new injury, trauma, or falls.  Seen yesterday for similar.    Home Medications Prior to Admission medications   Medication Sig Start Date End Date Taking? Authorizing Provider  acetaminophen (TYLENOL) 500 MG tablet Take 1 tablet (500 mg total) by mouth every 6 (six) hours as needed. 05/19/23   Fayrene Helper, PA-C  albuterol (VENTOLIN HFA) 108 (90 Base) MCG/ACT inhaler Inhale 1-2 puffs into the lungs every 6 (six) hours as needed for wheezing or shortness of breath. 05/19/21   Prosperi, Christian H, PA-C  azithromycin (ZITHROMAX Z-PAK) 250 MG tablet Take 2 tablets by mouth on day 1 then take 1 tablet once daily for 4 days. 05/19/23   Fayrene Helper, PA-C  bacitracin ointment Apply topically 2 times daily. 05/02/23   Arby Barrette, MD  benzonatate (TESSALON) 100 MG capsule Take 1 capsule (100 mg total) by mouth every 8 (eight) hours. 05/19/23   Fayrene Helper, PA-C  fluticasone (FLONASE) 50 MCG/ACT nasal spray Place 1 spray into both nostrils daily. 05/19/21   Prosperi, Christian H, PA-C  ibuprofen (ADVIL) 600 MG tablet Take 1 tablet (600 mg total) by mouth every 6 (six) hours as needed. 06/20/23   Fayrene Helper, PA-C  metFORMIN (GLUCOPHAGE) 500 MG tablet Take 1 tablet (500 mg total) by mouth 2 (two) times daily with a meal. 04/22/23   Dione Booze, MD  naproxen (NAPROSYN) 500 MG tablet Take 1 tablet (500 mg total) by mouth 2 (two) times daily with a meal. 03/10/23   Rancour, Jeannett Senior, MD  ondansetron (ZOFRAN) 4 MG tablet Take 1 tablet (4 mg total) by mouth every 6 (six)  hours as needed for nausea or vomiting. 04/09/23   Achille Rich, PA-C  risperiDONE (RISPERDAL) 2 MG tablet Take 1 tablet (2 mg total) by mouth at bedtime. 04/10/22 10/07/22  Alicia Amel, MD      Allergies    Penicillins and Povidone-iodine    Review of Systems   Review of Systems  Musculoskeletal:  Positive for arthralgias.  All other systems reviewed and are negative.   Physical Exam Updated Vital Signs BP 105/73 (BP Location: Right Arm)   Pulse 71   Temp 97.7 F (36.5 C) (Oral)   Resp 18   Ht 6' (1.829 m)   Wt 88 kg   SpO2 98%   BMI 26.31 kg/m   Physical Exam Vitals and nursing note reviewed.  Constitutional:      Appearance: He is well-developed.     Comments: Sleeping, not really wanting to wake up and answer questions  HENT:     Head: Normocephalic and atraumatic.     Comments: Dry scalp, dandruff Eyes:     Conjunctiva/sclera: Conjunctivae normal.     Pupils: Pupils are equal, round, and reactive to light.  Cardiovascular:     Rate and Rhythm: Normal rate and regular rhythm.     Heart sounds: Normal heart sounds.  Musculoskeletal:        General: Normal range of motion.     Cervical back: Normal  range of motion.  Skin:    General: Skin is warm and dry.  Neurological:     Mental Status: He is alert and oriented to person, place, and time.     ED Results / Procedures / Treatments   Labs (all labs ordered are listed, but only abnormal results are displayed) Labs Reviewed - No data to display  EKG None  Radiology No results found.  Procedures Procedures    Medications Ordered in ED Medications - No data to display  ED Course/ Medical Decision Making/ A&P                                 Medical Decision Making  49 year old male here with bilateral arm pain.  Seen yesterday for same.  He is well-known to the ED, 96 visits in the past 6 months.  He is sleeping and not really interested in participating in exam or answering any questions for  me.  His vitals are stable.  Do not feel he needs any emergent workup.  Will discharge.  Final Clinical Impression(s) / ED Diagnoses Final diagnoses:  Pain in both upper extremities    Rx / DC Orders ED Discharge Orders     None         Garlon Hatchet, PA-C 06/28/23 1914    Gilda Crease, MD 06/28/23 [phone]

## 2023-06-29 ENCOUNTER — Encounter (HOSPITAL_COMMUNITY): Payer: Self-pay

## 2023-06-29 ENCOUNTER — Other Ambulatory Visit: Payer: Self-pay

## 2023-06-29 ENCOUNTER — Emergency Department (HOSPITAL_COMMUNITY)
Admission: EM | Admit: 2023-06-29 | Discharge: 2023-06-30 | Disposition: A | Discharge status: Home / Self Care | Attending: Emergency Medicine | Admitting: Emergency Medicine

## 2023-06-29 DIAGNOSIS — S0990XA Unspecified injury of head, initial encounter: Secondary | ICD-10-CM | POA: Diagnosis not present

## 2023-06-29 DIAGNOSIS — I10 Essential (primary) hypertension: Secondary | ICD-10-CM | POA: Insufficient documentation

## 2023-06-29 DIAGNOSIS — E119 Type 2 diabetes mellitus without complications: Secondary | ICD-10-CM | POA: Diagnosis not present

## 2023-06-29 DIAGNOSIS — J45909 Unspecified asthma, uncomplicated: Secondary | ICD-10-CM | POA: Diagnosis not present

## 2023-06-29 DIAGNOSIS — Z7984 Long term (current) use of oral hypoglycemic drugs: Secondary | ICD-10-CM | POA: Diagnosis not present

## 2023-06-29 DIAGNOSIS — Z5900 Homelessness unspecified: Secondary | ICD-10-CM | POA: Diagnosis not present

## 2023-06-29 DIAGNOSIS — W19XXXA Unspecified fall, initial encounter: Secondary | ICD-10-CM | POA: Insufficient documentation

## 2023-06-29 NOTE — ED Notes (Signed)
 Patient left before discharge instructions explained. Opportunity for questioning and answers were provided. Armband removed by staff, pt discharged from ED. Ambulated out to lobby

## 2023-06-29 NOTE — ED Triage Notes (Signed)
 Pt reports that he fell asleep on the bench "near [employer] hangout" and hit his head. Pt complains of headache and drowsiness. Upon asking further questions, pt asks "You had any good chili beans lately?" And then proceeds to give this RN a recipe. Pt easily redirectable. NAD noted. VSS.

## 2023-06-29 NOTE — ED Provider Notes (Signed)
 [place] EMERGENCY DEPARTMENT AT [facility] Provider Note   CSN: [id number] Arrival date & time: 06/29/23  2029     History  Chief Complaint  Patient presents with   Headache    Alejandro Baker is a 48 y.o. male.  The history is provided by the patient.  Patient with history of hypertension, schizoaffective disorder reports he fell several days ago hitting his head.  He reports he feels drowsy and is unable to get up.    Past Medical History:  Diagnosis Date   Asthma    DM (diabetes mellitus) (HCC)    Homeless    Hypertension    Neuropathy    Schizoaffective disorder, bipolar type (HCC)     Home Medications Prior to Admission medications   Medication Sig Start Date End Date Taking? Authorizing Provider  acetaminophen (TYLENOL) 500 MG tablet Take 1 tablet (500 mg total) by mouth every 6 (six) hours as needed. 05/19/23   Fayrene Helper, PA-C  albuterol (VENTOLIN HFA) 108 (90 Base) MCG/ACT inhaler Inhale 1-2 puffs into the lungs every 6 (six) hours as needed for wheezing or shortness of breath. 05/19/21   Prosperi, Christian H, PA-C  bacitracin ointment Apply topically 2 times daily. 05/02/23   Arby Barrette, MD  fluticasone (FLONASE) 50 MCG/ACT nasal spray Place 1 spray into both nostrils daily. 05/19/21   Prosperi, Christian H, PA-C  metFORMIN (GLUCOPHAGE) 500 MG tablet Take 1 tablet (500 mg total) by mouth 2 (two) times daily with a meal. 04/22/23   Dione Booze, MD  risperiDONE (RISPERDAL) 2 MG tablet Take 1 tablet (2 mg total) by mouth at bedtime. 04/10/22 10/07/22  Alicia Amel, MD      Allergies    Penicillins and Povidone-iodine    Review of Systems   Review of Systems  Physical Exam Updated Vital Signs BP (!) 141/101   Pulse (!) 113   Temp 97.6 F (36.4 C)   Resp 16   Ht 1.829 m (6')   Wt 88 kg   SpO2 97%   BMI 26.31 kg/m  Physical Exam CONSTITUTIONAL: Disheveled, no acute distress HEAD: Abrasion to forehead, no other signs of trauma EYES:  EOMI ENMT: Mucous membranes moist NECK: supple no meningeal signs NEURO: Pt is awake/alert/appropriate, moves all extremitiesx4.  No facial droop.   EXTREMITIES: full ROM SKIN: warm, color normal PSYCH: Anxious  ED Results / Procedures / Treatments   Labs (all labs ordered are listed, but only abnormal results are displayed) Labs Reviewed - No data to display  EKG None  Radiology No results found.  Procedures Procedures    Medications Ordered in ED Medications - No data to display  ED Course/ Medical Decision Making/ A&P                                 Medical Decision Making  Patient presents for his 97th ER visit in 6 months. Patient reports he fell recently hitting his head. Patient reports he fell several days ago has been sleepy.  EMS Patient had multiple evaluations for this recently, most recent CT head on February 24th was negative When I asked patient to sit up on the side of bed, he became agitated, screaming and cursing Patient will be discharged       Final Clinical Impression(s) / ED Diagnoses Final diagnoses:  Minor head injury, initial encounter    Rx / DC Orders ED Discharge Orders  None         [name], MD 06/29/23 2330

## 2023-06-29 NOTE — Discharge Instructions (Signed)
You have had a head injury which does not appear to require admission at this time. A concussion is a state of changed mental ability from trauma. ° °SEEK IMMEDIATE MEDICAL ATTENTION IF: °There is confusion or drowsiness (although children frequently become drowsy after injury).  °You cannot awaken the injured person.  °There is nausea (feeling sick to your stomach) or continued, forceful vomiting.  °You notice dizziness or unsteadiness which is getting worse, or inability to walk.  °You have convulsions or unconsciousness.  °You experience severe, persistent headaches not relieved by Tylenol. (Do not take aspirin as this impairs clotting abilities). Take other pain medications only as directed.  °You cannot use arms or legs normally.  °There are changes in pupil sizes. (This is the black center in the colored part of the eye)  °There is clear or bloody discharge from the nose or ears.  °Change in speech, vision, swallowing, or understanding.  °Localized weakness, numbness, tingling, or change in bowel or bladder control. ° °

## 2023-07-01 ENCOUNTER — Emergency Department (HOSPITAL_COMMUNITY)
Admission: EM | Admit: 2023-07-01 | Discharge: 2023-07-01 | Discharge status: Against Medical Advice | Attending: Emergency Medicine | Admitting: Emergency Medicine

## 2023-07-01 ENCOUNTER — Other Ambulatory Visit (HOSPITAL_COMMUNITY): Payer: Self-pay

## 2023-07-01 ENCOUNTER — Other Ambulatory Visit: Payer: Self-pay

## 2023-07-01 ENCOUNTER — Encounter (HOSPITAL_COMMUNITY): Payer: Self-pay

## 2023-07-01 DIAGNOSIS — Z5321 Procedure and treatment not carried out due to patient leaving prior to being seen by health care provider: Secondary | ICD-10-CM | POA: Diagnosis not present

## 2023-07-01 DIAGNOSIS — R000 Tachycardia, unspecified: Secondary | ICD-10-CM | POA: Diagnosis not present

## 2023-07-01 DIAGNOSIS — R519 Headache, unspecified: Secondary | ICD-10-CM | POA: Insufficient documentation

## 2023-07-01 DIAGNOSIS — I10 Essential (primary) hypertension: Secondary | ICD-10-CM | POA: Diagnosis not present

## 2023-07-01 DIAGNOSIS — G4489 Other headache syndrome: Secondary | ICD-10-CM | POA: Diagnosis not present

## 2023-07-01 NOTE — ED Triage Notes (Signed)
 Pt BIB [facility] from street for headache. Pt well known to this department.  162/100 HR 110 Pt asking this RN "have you tried my soup bean recipe yet?"

## 2023-07-02 ENCOUNTER — Other Ambulatory Visit: Payer: Self-pay

## 2023-07-10 ENCOUNTER — Other Ambulatory Visit (HOSPITAL_COMMUNITY): Payer: Self-pay

## 2023-07-13 ENCOUNTER — Emergency Department
Admission: EM | Admit: 2023-07-13 | Discharge: 2023-07-13 | Disposition: A | Discharge status: Home / Self Care | Attending: Emergency Medicine | Admitting: Emergency Medicine

## 2023-07-13 ENCOUNTER — Other Ambulatory Visit: Payer: Self-pay

## 2023-07-13 DIAGNOSIS — R000 Tachycardia, unspecified: Secondary | ICD-10-CM | POA: Diagnosis not present

## 2023-07-13 DIAGNOSIS — F209 Schizophrenia, unspecified: Secondary | ICD-10-CM | POA: Insufficient documentation

## 2023-07-13 DIAGNOSIS — Z5900 Homelessness unspecified: Secondary | ICD-10-CM | POA: Insufficient documentation

## 2023-07-13 DIAGNOSIS — R443 Hallucinations, unspecified: Secondary | ICD-10-CM | POA: Diagnosis present

## 2023-07-13 DIAGNOSIS — R450 Nervousness: Secondary | ICD-10-CM | POA: Diagnosis not present

## 2023-07-13 LAB — CBC
HCT: 49.9 % (ref 39.0–52.0)
Hemoglobin: 16.9 g/dL (ref 13.0–17.0)
MCH: 29.4 pg (ref 26.0–34.0)
MCHC: 33.9 g/dL (ref 30.0–36.0)
MCV: 86.8 fL (ref 80.0–100.0)
Platelets: 353 10*3/uL (ref 150–400)
RBC: 5.75 MIL/uL (ref 4.22–5.81)
RDW: 13.9 % (ref 11.5–15.5)
WBC: 12.3 10*3/uL — ABNORMAL HIGH (ref 4.0–10.5)
nRBC: 0 % (ref 0.0–0.2)

## 2023-07-13 LAB — SALICYLATE LEVEL: Salicylate Lvl: <7 mg/dL — ABNORMAL LOW (ref 7.0–30.0)

## 2023-07-13 LAB — COMPREHENSIVE METABOLIC PANEL
ALT: 24 U/L (ref 0–44)
AST: 34 U/L (ref 15–41)
Albumin: 4.9 g/dL (ref 3.5–5.0)
Alkaline Phosphatase: 83 U/L (ref 38–126)
Anion gap: 13 (ref 5–15)
BUN: 35 mg/dL — ABNORMAL HIGH (ref 6–20)
CO2: 20 mmol/L — ABNORMAL LOW (ref 22–32)
Calcium: 9.4 mg/dL (ref 8.9–10.3)
Chloride: 96 mmol/L — ABNORMAL LOW (ref 98–111)
Creatinine, Ser: 1.68 mg/dL — ABNORMAL HIGH (ref 0.61–1.24)
GFR, Estimated: 50 mL/min — ABNORMAL LOW (ref >60)
Glucose, Bld: 462 mg/dL — ABNORMAL HIGH (ref 70–99)
Potassium: 3.9 mmol/L (ref 3.5–5.1)
Sodium: 129 mmol/L — ABNORMAL LOW (ref 135–145)
Total Bilirubin: 1.4 mg/dL — ABNORMAL HIGH (ref 0.0–1.2)
Total Protein: 8.6 g/dL — ABNORMAL HIGH (ref 6.5–8.1)

## 2023-07-13 LAB — ACETAMINOPHEN LEVEL: Acetaminophen (Tylenol), Serum: <10 ug/mL — ABNORMAL LOW (ref 10–30)

## 2023-07-13 LAB — ETHANOL: Alcohol, Ethyl (B): <10 mg/dL (ref <10)

## 2023-07-13 MED ORDER — ONDANSETRON HCL 4 MG PO TABS: 4.0000 mg | ORAL_TABLET | Freq: Three times a day (TID) | ORAL | Status: DC | PRN

## 2023-07-13 MED ORDER — IBUPROFEN 600 MG PO TABS
600.0000 mg | ORAL_TABLET | Freq: Three times a day (TID) | ORAL | Status: DC | PRN
  Administered 2023-07-13: 600 mg via ORAL
  Filled 2023-07-13: qty 1

## 2023-07-13 MED ORDER — ALUM & MAG HYDROXIDE-SIMETH 200-200-20 MG/5ML PO SUSP: 30.0000 mL | Freq: Four times a day (QID) | ORAL | Status: DC | PRN

## 2023-07-13 NOTE — ED Notes (Signed)
 Pt reminded that urine sample is needed and stated "not right now" when asked to provide one.

## 2023-07-13 NOTE — ED Triage Notes (Addendum)
 Pt to ED via  [facility] from gas station. Pt having visual and auditory hallucinations. Hx of schizophrenia. Unsure if med compliant. Pt A&Ox2 on arrival. Pt is seeing pigs and bugs and is making arm gestures and shooting them with a gun/bow. Denies SI/HI. No known hx of violence. Pt denies etoh or drug use.   EMS VS: 20 RR BP 140/90 98%  97.6 oral

## 2023-07-13 NOTE — ED Notes (Addendum)
 Pt dressed out into beh appropriate scrubs with this EDT and reina, RN  Belongings placed in belonging bag and labled. Belongings are as follows: Black  t shirt Jeans 2 grey socks 2 black shoes

## 2023-07-13 NOTE — ED Notes (Signed)
 Telemonitor removed from room at this time.

## 2023-07-13 NOTE — ED Notes (Signed)
 Pt asking if this RN has seen his friend.  He states his friend's name is Florentina Addison and she has long blonde hair and he saw her "about an hour ago in parking lot."  Pt notified that this RN has not seen Katie.  The states " Why's my hands hurt?" Pt  states "sure" when asked if he would like some Ibuprofen.

## 2023-07-13 NOTE — ED Notes (Signed)
 Pt eating from dinner tray.  Continues to refuse providing urine sample.

## 2023-07-13 NOTE — Consult Note (Signed)
  Patient noted lying in the bed with disheveled appearance. Patient noted making nonsensical statements, exaggerated body movements, and appears drowsy. He is inattentive and unable to fully engage in the psychiatric evaluation at this time.

## 2023-07-13 NOTE — ED Notes (Signed)
 When asked what brought him in today, pt stated/acted out "Officer! Officer!" Then made sudden flailing movement with arms and legs, then stopped.

## 2023-07-13 NOTE — ED Provider Notes (Addendum)
 [facility] Provider Note    Event Date/Time   First MD Initiated Contact with Patient 07/13/23 1516     (approximate)   History   Chief Complaint: Hallucinations   HPI  Alejandro Baker is a 48 y.o. male with a history of schizophrenia, homelessness, malingering, substance abuse who is brought to the ED by EMS from a gas station due to suspected hallucinations.  Triage nurse also reports that on arrival here patient was making gestures like he was trying to shoot animals in the room that were not present.  Patient does not have a weapon.  To me, patient denies pain or illness.  He denies any wounds.  Denies visual or auditory hallucinations.  States that he feels fine.  Asks for water and food.  He is not able to tell me about medications he might be taking.     Physical Exam   Triage Vital Signs: ED Triage Vitals  Encounter Vitals Group     BP 07/13/23 1344 (!) 132/122     Systolic BP Percentile --      Diastolic BP Percentile --      Pulse Rate 07/13/23 1344 (!) 123     Resp 07/13/23 1344 20     Temp 07/13/23 1347 98 F (36.7 C)     Temp Source 07/13/23 1347 Axillary     SpO2 07/13/23 1344 95 %     Weight --      Height --      Head Circumference --      Peak Flow --      Pain Score 07/13/23 1347 0     Pain Loc --      Pain Education --      Exclude from Growth Chart --     Most recent vital signs: Vitals:   07/13/23 1344 07/13/23 1347  BP: (!) 132/122   Pulse: (!) 123   Resp: 20   Temp:  98 F (36.7 C)  SpO2: 95%     General: Awake, no distress. CV:  Good peripheral perfusion.  Resp:  Normal effort.  Abd:  No distention.  Other:  No wounds.  Calm, no apparent internal stimuli. Has rhythmic movements of the mouth c/w tardive dyskinesia.   ED Results / Procedures / Treatments   Labs (all labs ordered are listed, but only abnormal results are displayed) Labs Reviewed  COMPREHENSIVE METABOLIC PANEL - Abnormal; Notable for the  following components:      Result Value   Sodium 129 (*)    Chloride 96 (*)    CO2 20 (*)    Glucose, Bld 462 (*)    BUN 35 (*)    Creatinine, Ser 1.68 (*)    Total Protein 8.6 (*)    Total Bilirubin 1.4 (*)    GFR, Estimated 50 (*)    All other components within normal limits  SALICYLATE LEVEL - Abnormal; Notable for the following components:   Salicylate Lvl <7.0 (*)    All other components within normal limits  ACETAMINOPHEN LEVEL - Abnormal; Notable for the following components:   Acetaminophen (Tylenol), Serum <10 (*)    All other components within normal limits  CBC - Abnormal; Notable for the following components:   WBC 12.3 (*)    All other components within normal limits  ETHANOL  URINE DRUG SCREEN, QUALITATIVE ([facility] ONLY)     EKG    RADIOLOGY    PROCEDURES:  Procedures   MEDICATIONS ORDERED  IN ED: Medications  ibuprofen (ADVIL) tablet 600 mg (600 mg Oral Given 07/13/23 1617)  ondansetron (ZOFRAN) tablet 4 mg (has no administration in time range)  alum & mag hydroxide-simeth (MAALOX/MYLANTA) 200-200-20 MG/5ML suspension 30 mL (has no administration in time range)     IMPRESSION / MDM / ASSESSMENT AND PLAN / ED COURSE  I reviewed the triage vital signs and the nursing notes.  Patient's presentation is most consistent with exacerbation of chronic illness.  Patient brought to the ED due to suspected hallucinations.  In the treatment room, patient denies any complaints and is calm.  No signs of alcohol withdrawal.  He has had many, frequent ED visits over the last 6 months for trivial presentations.  Will request psychiatry evaluation today for medication management.  He is medically stable, not an imminent danger to himself or others and not committable right now.  The patient has been placed in psychiatric observation due to the need to provide a safe environment for the patient while obtaining psychiatric consultation and evaluation, as well as ongoing  medical and medication management to treat the patient's condition.  The patient has not been placed under full IVC at this time.  Clinical Course as of 07/13/23 1801  Sat Jul 13, 2023  1739 Labs c/w dehydration. Pt given food and water for oral hydration. He is calm, not exhibiting signs of psychosis currently.  [PS]    Clinical Course User Index [PS] Sharman Cheek, MD    ----------------------------------------- 6:01 PM on 07/13/2023 ----------------------------------------- Patient wishes to leave, he is ambulatory with steady gait, oriented, clear speech, interactive.  He is clinically sober and lucid and stable for discharge   FINAL CLINICAL IMPRESSION(S) / ED DIAGNOSES   Final diagnoses:  Schizophrenia, unspecified type (HCC)  Homelessness     Rx / DC Orders   ED Discharge Orders     None        Note:  This document was prepared using Dragon voice recognition software and may include unintentional dictation errors.   Sharman Cheek, MD 07/13/23 1553    Sharman Cheek, MD 07/13/23 [phone]

## 2023-07-13 NOTE — ED Notes (Signed)
 This RN requested urine sample from pt again.  Pt not speaking to RN.  Urinal placed at bedside.

## 2023-07-13 NOTE — ED Notes (Signed)
 Pt asking this RN if I went "to the wedding."  Calm and cooperative at this time. Asking if this RN has seen Gerhard Munch. States that he used to watch with is nieces and nephews and he likes the guy that sweeps chimneys states that "he would walk and sweep and swing." Continues to sing.Pt laughing to himself at this time.

## 2023-07-13 NOTE — ED Notes (Signed)
 Pt stating that he has to go home b/c "my sister's there" He has continually refused urine sample and his Psych eval was not completed d/t noncompliance. Pt standing at door to room at this time, asking for milk. EDP Stafford notified.

## 2023-07-13 NOTE — ED Notes (Signed)
 Pt speaking with Psych NP [facility] via telemonitor at this time.

## 2023-07-13 NOTE — ED Notes (Signed)
 TTS unable to conduct assessment, as patient was unclear with speech and was unable to be engaged effectively by the assessor.

## 2023-07-14 ENCOUNTER — Emergency Department
Admission: EM | Admit: 2023-07-14 | Discharge: 2023-07-14 | Disposition: A | Discharge status: Home / Self Care | Attending: Emergency Medicine | Admitting: Emergency Medicine

## 2023-07-14 ENCOUNTER — Other Ambulatory Visit: Payer: Self-pay

## 2023-07-14 DIAGNOSIS — R456 Violent behavior: Secondary | ICD-10-CM | POA: Diagnosis not present

## 2023-07-14 DIAGNOSIS — Z0000 Encounter for general adult medical examination without abnormal findings: Secondary | ICD-10-CM | POA: Insufficient documentation

## 2023-07-14 DIAGNOSIS — Z5900 Homelessness unspecified: Secondary | ICD-10-CM | POA: Insufficient documentation

## 2023-07-14 DIAGNOSIS — J029 Acute pharyngitis, unspecified: Secondary | ICD-10-CM | POA: Diagnosis present

## 2023-07-14 DIAGNOSIS — R070 Pain in throat: Secondary | ICD-10-CM | POA: Diagnosis not present

## 2023-07-14 DIAGNOSIS — M542 Cervicalgia: Secondary | ICD-10-CM | POA: Diagnosis not present

## 2023-07-14 DIAGNOSIS — R069 Unspecified abnormalities of breathing: Secondary | ICD-10-CM | POA: Diagnosis not present

## 2023-07-14 DIAGNOSIS — M79605 Pain in left leg: Secondary | ICD-10-CM | POA: Diagnosis not present

## 2023-07-14 DIAGNOSIS — I10 Essential (primary) hypertension: Secondary | ICD-10-CM | POA: Diagnosis not present

## 2023-07-14 DIAGNOSIS — Z139 Encounter for screening, unspecified: Secondary | ICD-10-CM

## 2023-07-14 DIAGNOSIS — R000 Tachycardia, unspecified: Secondary | ICD-10-CM | POA: Diagnosis not present

## 2023-07-14 DIAGNOSIS — R4689 Other symptoms and signs involving appearance and behavior: Secondary | ICD-10-CM | POA: Diagnosis present

## 2023-07-14 HISTORY — DX: Malingerer (conscious simulation): Z76.5

## 2023-07-14 NOTE — ED Provider Notes (Signed)
   [facility] Provider Note    Event Date/Time   First MD Initiated Contact with Patient 07/14/23 1140     (approximate)   History   bruise on head   HPI  Alejandro Baker is a 48 y.o. male with history as described in the chart just seen and discharged earlier this morning has recheck back in with complaints of a bruise on his head.  He denies trauma.  He denies neurodeficits.     Physical Exam   Triage Vital Signs: ED Triage Vitals  Encounter Vitals Group     BP 07/14/23 1127 117/88     Systolic BP Percentile --      Diastolic BP Percentile --      Pulse Rate 07/14/23 1127 (!) 104     Resp 07/14/23 1127 20     Temp 07/14/23 1127 98.9 F (37.2 C)     Temp Source 07/14/23 1127 Oral     SpO2 07/14/23 1127 100 %     Weight 07/14/23 1123 88 kg (194 lb 0.1 oz)     Height 07/14/23 1123 1.829 m (6')     Head Circumference --      Peak Flow --      Pain Score --      Pain Loc --      Pain Education --      Exclude from Growth Chart --     Most recent vital signs: Vitals:   07/14/23 1127  BP: 117/88  Pulse: (!) 104  Resp: 20  Temp: 98.9 F (37.2 C)  SpO2: 100%     General: Awake, no distress.  CV:  Good peripheral perfusion.  Resp:  Normal effort.  Abd:  No distention.  Other:  No evidence of bruise contusion tenderness to the scalp   ED Results / Procedures / Treatments   Labs (all labs ordered are listed, but only abnormal results are displayed) Labs Reviewed - No data to display   EKG     RADIOLOGY     PROCEDURES:  Critical Care performed:   Procedures   MEDICATIONS ORDERED IN ED: Medications - No data to display   IMPRESSION / MDM / ASSESSMENT AND PLAN / ED COURSE  I reviewed the triage vital signs and the nursing notes. Patient's presentation is most consistent with acute, uncomplicated illness.  Patient appears to be at his baseline, no indication for imaging given reassuring exam, appropriate for  discharge        FINAL CLINICAL IMPRESSION(S) / ED DIAGNOSES   Final diagnoses:  Encounter for medical screening examination     Rx / DC Orders   ED Discharge Orders     None        Note:  This document was prepared using Dragon voice recognition software and may include unintentional dictation errors.   Jene Every, MD 07/14/23 1330

## 2023-07-14 NOTE — Discharge Instructions (Addendum)
Return to the ER for new or worsening symptoms. °

## 2023-07-14 NOTE — ED Triage Notes (Addendum)
 This is pt's 4th check in in the past 24 hrs, pt is homeless wondering the parking lot and other nearby business then checks back in. Now has c/o of left leg pain, no new injury. Steady gate is observed as he is ambulatory in and out the [facility] lobby.  Pt is at his baseline and is malingering.

## 2023-07-14 NOTE — ED Provider Notes (Signed)
   [facility] Provider Note    Event Date/Time   First MD Initiated Contact with Patient 07/14/23 0502     (approximate)   History   Sore Throat   HPI  Alejandro Baker is a 48 year old male with history of hypertension, schizoaffective disorder presenting with sore throat.  Patient provides limited additional history.  On review of patient's chart, frequent ER visits.  On outside ER note from 06/29/2023 noted to have 97 ER visits within the last 6 months, has had 2 additional since that time.  Seen earlier today in our ER for possible hallucinations.  Psychiatry was consulted, but unable to perform an assessment on the patient.  He was ultimately discharged in stable condition.     Physical Exam   Triage Vital Signs: ED Triage Vitals  Encounter Vitals Group     BP 07/14/23 0504 (!) 134/100     Systolic BP Percentile --      Diastolic BP Percentile --      Pulse Rate 07/14/23 0504 (!) 103     Resp 07/14/23 0504 18     Temp 07/14/23 0504 99.9 F (37.7 C)     Temp Source 07/14/23 0504 Oral     SpO2 07/14/23 0504 100 %     Weight --      Height --      Head Circumference --      Peak Flow --      Pain Score 07/14/23 0506 0     Pain Loc --      Pain Education --      Exclude from Growth Chart --     Most recent vital signs: Vitals:   07/14/23 0504  BP: (!) 134/100  Pulse: (!) 103  Resp: 18  Temp: 99.9 F (37.7 C)  SpO2: 100%     General: Awake, interactive  HEENT: No significant erythema the posterior oropharynx.  No tonsillar exudates.  Uvula midline.  No swelling underneath the tongue CV:  Good peripheral perfusion Resp:  Unlabored respirations, lungs clear to auscultation Abd:  Nondistended.  Neuro:  Symmetric facial movement, fluid speech   ED Results / Procedures / Treatments   Labs (all labs ordered are listed, but only abnormal results are displayed) Labs Reviewed - No data to display   EKG EKG independently reviewed  interpreted by myself (ER attending) demonstrates:    RADIOLOGY Imaging independently reviewed and interpreted by myself demonstrates:    PROCEDURES:  Critical Care performed: No  Procedures   MEDICATIONS ORDERED IN ED: Medications - No data to display   IMPRESSION / MDM / ASSESSMENT AND PLAN / ED COURSE  I reviewed the triage vital signs and the nursing notes.  Differential diagnosis includes, but is not limited to, viral pharyngitis, low suspicion of strep pharyngitis, malingering  Patient's presentation is most consistent with acute, uncomplicated illness.  48 year old male presenting with sore throat.  Reassuring physical exam.  Do not think there is indication for further workup currently.  Patient discharged in stable condition.     FINAL CLINICAL IMPRESSION(S) / ED DIAGNOSES   Final diagnoses:  Sore throat     Rx / DC Orders   ED Discharge Orders     None        Note:  This document was prepared using Dragon voice recognition software and may include unintentional dictation errors.   Trinna Post, MD 07/14/23 0530

## 2023-07-14 NOTE — ED Notes (Signed)
 Pt called x 1 for triage after pt has checked in several times after being d/c in the pat 24+ hrs. Pt not seen in [facility], he has went back outside.

## 2023-07-14 NOTE — ED Triage Notes (Signed)
 Pt to ED for "bruise on head". Pt is homeless and schizophrenic and was here yesterday for mental health eval. Disheveled appearance. Has been wandering in and out of lobby this morning before checking in. Speech content is bizarre. Poor historian.   EDP to see pt in triage.

## 2023-07-14 NOTE — ED Triage Notes (Signed)
 Pt coming in via EMS d/t hotel calling because pt was wandering in parking lot. Pt c/o sore throat at this time.

## 2023-07-15 ENCOUNTER — Emergency Department
Admission: EM | Admit: 2023-07-15 | Discharge: 2023-07-15 | Disposition: A | Discharge status: Home / Self Care | Attending: Emergency Medicine | Admitting: Emergency Medicine

## 2023-07-15 ENCOUNTER — Other Ambulatory Visit: Payer: Self-pay

## 2023-07-15 ENCOUNTER — Emergency Department
Admission: EM | Admit: 2023-07-15 | Discharge: 2023-07-15 | Disposition: A | Discharge status: Home / Self Care | Source: Home / Self Care | Attending: Emergency Medicine | Admitting: Emergency Medicine

## 2023-07-15 ENCOUNTER — Encounter: Payer: Self-pay | Admitting: Emergency Medicine

## 2023-07-15 ENCOUNTER — Emergency Department
Admission: EM | Admit: 2023-07-15 | Discharge: 2023-07-15 | Disposition: A | Discharge status: Home / Self Care | Source: Home / Self Care

## 2023-07-15 ENCOUNTER — Emergency Department: Admission: EM | Admit: 2023-07-15 | Discharge: 2023-07-15 | Discharge status: Against Medical Advice

## 2023-07-15 DIAGNOSIS — R739 Hyperglycemia, unspecified: Secondary | ICD-10-CM | POA: Diagnosis not present

## 2023-07-15 DIAGNOSIS — I10 Essential (primary) hypertension: Secondary | ICD-10-CM | POA: Insufficient documentation

## 2023-07-15 DIAGNOSIS — Z765 Malingerer [conscious simulation]: Secondary | ICD-10-CM | POA: Insufficient documentation

## 2023-07-15 DIAGNOSIS — R52 Pain, unspecified: Secondary | ICD-10-CM | POA: Insufficient documentation

## 2023-07-15 DIAGNOSIS — Z139 Encounter for screening, unspecified: Secondary | ICD-10-CM

## 2023-07-15 DIAGNOSIS — Z5900 Homelessness unspecified: Secondary | ICD-10-CM | POA: Insufficient documentation

## 2023-07-15 DIAGNOSIS — Z0000 Encounter for general adult medical examination without abnormal findings: Secondary | ICD-10-CM | POA: Insufficient documentation

## 2023-07-15 DIAGNOSIS — E119 Type 2 diabetes mellitus without complications: Secondary | ICD-10-CM | POA: Insufficient documentation

## 2023-07-15 DIAGNOSIS — J029 Acute pharyngitis, unspecified: Secondary | ICD-10-CM | POA: Diagnosis not present

## 2023-07-15 DIAGNOSIS — R6884 Jaw pain: Secondary | ICD-10-CM | POA: Diagnosis not present

## 2023-07-15 DIAGNOSIS — R531 Weakness: Secondary | ICD-10-CM | POA: Diagnosis not present

## 2023-07-15 NOTE — ED Notes (Signed)
 Patient visualized walking out of ER, and walking through the parking lot.

## 2023-07-15 NOTE — ED Triage Notes (Signed)
 Pt to ED via [facility] from local gas station where pt got into the truck himself when EMS was stopped. Pt requested to come to ED because he was cold and his chin hurt. Pt alert and oriented x4 on arrival, ambulatory with steady gait, no acute distress noted. When writer asking pt for symptoms, pt becomes boisterous and sts, "Fuck this. I just need to go to a different hospital somewhere else do that I can stay." Armed officer on campus called and walking with EMS and pt to room [address] Hall[address].

## 2023-07-15 NOTE — ED Provider Notes (Signed)
   [facility] Provider Note    Event Date/Time   First MD Initiated Contact with Patient 07/15/23 0004     (approximate)   History   Sore Throat and Foot Pain   HPI  Alejandro Baker is a 48 year old male with frequent ER visits presenting for sore throat.  Reports intermittent and longstanding, improved with cough drops.  Seen by myself for similar yesterday.  Very frequent ER visits with over 100 visits in the last 6 months.     Physical Exam   Triage Vital Signs: ED Triage Vitals [07/15/23 0004]  Encounter Vitals Group     BP      Systolic BP Percentile      Diastolic BP Percentile      Pulse      Resp      Temp      Temp src      SpO2      Weight      Height      Head Circumference      Peak Flow      Pain Score 0     Pain Loc      Pain Education      Exclude from Growth Chart     Most recent vital signs: Vitals:   07/15/23 0010  BP: (!) 124/99  Pulse: (!) 102  Resp: 16  Temp: 98.1 F (36.7 C)  SpO2: 96%     General: Awake, interactive  HEENT: No difficulty with jaw opening, no significant erythema of the posterior oropharynx.  No tonsillar swelling or uvular swelling or deviation.  No swelling underneath the tongue.  No palpable lymphadenopathy. CV:  Good peripheral perfusion Resp:  Unlabored respirations.  Abd:  Nondistended.  Neuro:  Symmetric facial movement, fluid speech   ED Results / Procedures / Treatments   Labs (all labs ordered are listed, but only abnormal results are displayed) Labs Reviewed - No data to display   EKG EKG independently reviewed interpreted by myself (ER attending) demonstrates:    RADIOLOGY Imaging independently reviewed and interpreted by myself demonstrates:    PROCEDURES:  Critical Care performed: No  Procedures   MEDICATIONS ORDERED IN ED: Medications - No data to display   IMPRESSION / MDM / ASSESSMENT AND PLAN / ED COURSE  I reviewed the triage vital signs and the  nursing notes.  Differential diagnosis includes, but is not limited to, viral illness, chronic sore throat, malingering, no evidence of strep pharyngitis, peritonsillar abscess, other symptom acute pathology  Patient's presentation is most consistent with acute, uncomplicated illness.  48 year old male presenting with sore throat, reassuring exam.  No indication for further diagnostics.  Discharged in stable condition.      FINAL CLINICAL IMPRESSION(S) / ED DIAGNOSES   Final diagnoses:  Sore throat     Rx / DC Orders   ED Discharge Orders     None        Note:  This document was prepared using Dragon voice recognition software and may include unintentional dictation errors.   Trinna Post, MD 07/15/23 [phone]

## 2023-07-15 NOTE — ED Triage Notes (Signed)
 Pt to ED via [facility] from [facility] where pt c/o chin pain. Per EMS, pt aggressive and communicating verbal threats while in transport. Pt arrived A&O x4, ambulatory, no acute distress noted at this time. Pt seen on arrival by Katrinka Blazing MD and will be discharged per Katrinka Blazing MD. On-campus law enforcement called to escort pt out of ED.

## 2023-07-15 NOTE — Discharge Instructions (Signed)
Please take Tylenol and ibuprofen/Advil for your pain.  It is safe to take them together, or to alternate them every few hours.  Take up to 1000mg of Tylenol at a time, up to 4 times per day.  Do not take more than 4000 mg of Tylenol in 24 hours.  For ibuprofen, take 400-600 mg, 3 - 4 times per day.  

## 2023-07-15 NOTE — ED Notes (Signed)
 First Nurse Note:  Patient arrives via [facility] from [facility], call out was for Hyperglycemia; per facility CBG 455.  CBG w/ EMS 430.  Patient alert, NAD noted at this time.

## 2023-07-15 NOTE — ED Triage Notes (Signed)
 Pt presents via EMS c/o thorat pain. Pt seen on arrival by Ray MD and will be d/c per Dr. Rosalia Hammers. Pt in no acute distress at this time.   Ambulatory.   Pt very well know to this system with 102 visits within 6 months.
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
## 2023-07-15 NOTE — ED Provider Notes (Signed)
 [facility] Provider Note    None    (approximate)   History   Homeless   HPI  Alejandro Baker is a 48 y.o. male   Past medical history of medical chart review reveals history of hypertension schizoaffective diabetes homelessness and malingering who presents to the emergency department with no chief complaint.  He was just seen and discharged by a provider a couple of hours ago tonight.  When asked why he is back he does not give me an answer and instead waved his hands for me to leave and when I inquired further he points to his mouth.   He opens his mouth I see no intraoral lesions swelling or abnormalities.  His airway is intact.   Physical Exam   Triage Vital Signs: ED Triage Vitals  Encounter Vitals Group     BP      Systolic BP Percentile      Diastolic BP Percentile      Pulse      Resp      Temp      Temp src      SpO2      Weight      Height      Head Circumference      Peak Flow      Pain Score      Pain Loc      Pain Education      Exclude from Growth Chart     Most recent vital signs: Vitals:   07/15/23 2329  BP: (!) 158/96  Pulse: 88  Resp: 18  Temp: 98 F (36.7 C)  SpO2: 100%    General: Awake, no distress.  CV:  Good peripheral perfusion.  Resp:  Normal effort.  Abd:  No distention.  Other:  Resting comfortably moving all extremities intraoral examination appears normal.   ED Results / Procedures / Treatments   Labs (all labs ordered are listed, but only abnormal results are displayed) Labs Reviewed - No data to display   PROCEDURES:  Critical Care performed: No  Procedures   MEDICATIONS ORDERED IN ED: Medications - No data to display  IMPRESSION / MDM / ASSESSMENT AND PLAN / ED COURSE  I reviewed the triage vital signs and the nursing notes.                                Patient's presentation is most consistent with severe exacerbation of chronic illness.  Differential diagnosis  includes, but is not limited to, intraoral infection, trauma, malingering    MDM:    Not really sure why this patient is here but he has been evaluated numerous times lately most recently a couple hours ago by my colleague and was discharged.  Has a medical history of malingering I suspect the same today.  He looks well there is no obvious trauma he has no obvious chief complaints, points to his mouth which looks normal maintaining airway I doubt life-threatening emergency at this time he will be discharged escorted off the premises.       FINAL CLINICAL IMPRESSION(S) / ED DIAGNOSES   Final diagnoses:  Encounter for medical screening examination     Rx / DC Orders   ED Discharge Orders     None        Note:  This document was prepared using Dragon voice recognition software and may include unintentional dictation errors.  Pilar Jarvis, MD 07/15/23 [phone]

## 2023-07-15 NOTE — Discharge Instructions (Signed)
Return to the ER for new or worsening symptoms. °

## 2023-07-16 ENCOUNTER — Encounter: Payer: Self-pay | Admitting: Emergency Medicine

## 2023-07-16 ENCOUNTER — Emergency Department
Admission: EM | Admit: 2023-07-16 | Discharge: 2023-07-16 | Disposition: A | Discharge status: Home / Self Care | Attending: Emergency Medicine | Admitting: Emergency Medicine

## 2023-07-16 DIAGNOSIS — Z5900 Homelessness unspecified: Secondary | ICD-10-CM | POA: Diagnosis not present

## 2023-07-16 DIAGNOSIS — K1379 Other lesions of oral mucosa: Secondary | ICD-10-CM | POA: Diagnosis not present

## 2023-07-16 DIAGNOSIS — J45909 Unspecified asthma, uncomplicated: Secondary | ICD-10-CM | POA: Diagnosis not present

## 2023-07-16 DIAGNOSIS — Z7984 Long term (current) use of oral hypoglycemic drugs: Secondary | ICD-10-CM | POA: Diagnosis not present

## 2023-07-16 DIAGNOSIS — I10 Essential (primary) hypertension: Secondary | ICD-10-CM | POA: Insufficient documentation

## 2023-07-16 DIAGNOSIS — R6889 Other general symptoms and signs: Secondary | ICD-10-CM

## 2023-07-16 DIAGNOSIS — R6883 Chills (without fever): Secondary | ICD-10-CM | POA: Diagnosis not present

## 2023-07-16 DIAGNOSIS — E1140 Type 2 diabetes mellitus with diabetic neuropathy, unspecified: Secondary | ICD-10-CM | POA: Diagnosis not present

## 2023-07-16 NOTE — Discharge Instructions (Addendum)
 Your vital signs are reassuring.  Please use the emergency department for emergencies only.  Return to the ER for persistent vomiting, difficulty breathing or other emergent concerns.
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
## 2023-07-18 NOTE — ED Provider Notes (Signed)
   [facility] Provider Note    Event Date/Time   First MD Initiated Contact with Patient 07/14/23 1954     (approximate)   History   Leg Pain   HPI  Alejandro Baker is a 48 y.o. male with history  as listed in EMR presents to the emergency department for evaluation of left leg pain This is his 4th visit to the ER in the past 24 hours, each for different complaints.      Physical Exam   Triage Vital Signs: ED Triage Vitals  Encounter Vitals Group     BP 07/14/23 1936 118/89     Systolic BP Percentile --      Diastolic BP Percentile --      Pulse Rate 07/14/23 1936 (!) 114     Resp 07/14/23 1936 20     Temp 07/14/23 1936 98.4 F (36.9 C)     Temp Source 07/14/23 1936 Oral     SpO2 07/14/23 1936 94 %     Weight 07/14/23 1940 194 lb 0.1 oz (88 kg)     Height 07/14/23 1940 6' (1.829 m)     Head Circumference --      Peak Flow --      Pain Score 07/14/23 1940 5     Pain Loc --      Pain Education --      Exclude from Growth Chart --     Most recent vital signs: Vitals:   07/14/23 1936 07/14/23 1942  BP: 118/89   Pulse: (!) 114 100  Resp: 20 18  Temp: 98.4 F (36.9 C)   SpO2: 94% 95%    General: Awake, no distress.  CV:  Good peripheral perfusion.  Resp:  Normal effort.  Abd:  No distention.  Other:  Declines evaluation of his left leg.   ED Results / Procedures / Treatments   Labs (all labs ordered are listed, but only abnormal results are displayed) Labs Reviewed - No data to display   EKG     RADIOLOGY  Image and radiology report reviewed and interpreted by me. Radiology report consistent with the same.    PROCEDURES:  Critical Care performed: No  Procedures   MEDICATIONS ORDERED IN ED:  Medications - No data to display   IMPRESSION / MDM / ASSESSMENT AND PLAN / ED COURSE   I have reviewed the triage note.  Differential diagnosis includes, but is not limited to, malingering, MSK pain  Patient's  presentation is most consistent with acute, uncomplicated illness.  48 year old male presents to the ER with initial complaint of left leg pain. He is now lying on the stretcher and does not want to talk to me or let me examine his leg. He says he will just come back later. He was escorted out with presence of Catering manager.      FINAL CLINICAL IMPRESSION(S) / ED DIAGNOSES   Final diagnoses:  Left leg pain     Rx / DC Orders   ED Discharge Orders     None        Note:  This document was prepared using Dragon voice recognition software and may include unintentional dictation errors.   Chinita Pester, FNP 07/18/23 1954    Delton Prairie, MD 07/22/23 [phone]

## 2023-08-06 ENCOUNTER — Encounter (HOSPITAL_COMMUNITY): Payer: Self-pay

## 2023-08-06 ENCOUNTER — Ambulatory Visit (HOSPITAL_COMMUNITY)
Admission: EM | Admit: 2023-08-06 | Discharge: 2023-08-06 | Disposition: A | Discharge status: Home / Self Care | Attending: Psychiatry | Admitting: Psychiatry

## 2023-08-06 ENCOUNTER — Emergency Department (HOSPITAL_COMMUNITY)
Admission: EM | Admit: 2023-08-06 | Discharge: 2023-08-07 | Discharge status: Against Medical Advice | Attending: Emergency Medicine | Admitting: Emergency Medicine

## 2023-08-06 DIAGNOSIS — I10 Essential (primary) hypertension: Secondary | ICD-10-CM | POA: Diagnosis not present

## 2023-08-06 DIAGNOSIS — Z5900 Homelessness unspecified: Secondary | ICD-10-CM | POA: Insufficient documentation

## 2023-08-06 DIAGNOSIS — F250 Schizoaffective disorder, bipolar type: Secondary | ICD-10-CM | POA: Diagnosis present

## 2023-08-06 DIAGNOSIS — R739 Hyperglycemia, unspecified: Secondary | ICD-10-CM | POA: Diagnosis not present

## 2023-08-06 DIAGNOSIS — R1011 Right upper quadrant pain: Secondary | ICD-10-CM | POA: Diagnosis not present

## 2023-08-06 DIAGNOSIS — F17210 Nicotine dependence, cigarettes, uncomplicated: Secondary | ICD-10-CM | POA: Diagnosis not present

## 2023-08-06 DIAGNOSIS — R1084 Generalized abdominal pain: Secondary | ICD-10-CM | POA: Diagnosis not present

## 2023-08-06 DIAGNOSIS — Z5321 Procedure and treatment not carried out due to patient leaving prior to being seen by health care provider: Secondary | ICD-10-CM | POA: Diagnosis not present

## 2023-08-06 DIAGNOSIS — M79673 Pain in unspecified foot: Secondary | ICD-10-CM | POA: Diagnosis not present

## 2023-08-06 DIAGNOSIS — E1165 Type 2 diabetes mellitus with hyperglycemia: Secondary | ICD-10-CM | POA: Insufficient documentation

## 2023-08-06 DIAGNOSIS — Z5329 Procedure and treatment not carried out because of patient's decision for other reasons: Secondary | ICD-10-CM | POA: Diagnosis not present

## 2023-08-06 MED ORDER — OLANZAPINE 10 MG PO TABS
10.0000 mg | ORAL_TABLET | Freq: Once | ORAL | Status: AC
  Administered 2023-08-06: 10 mg via ORAL
  Filled 2023-08-06: qty 1
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
## 2023-08-06 NOTE — ED Triage Notes (Signed)
 Brought in via EMS, hyperglycemic and stating RUQ abd pain BS=501, non compliant with meds Hx schizophrenia  Homeless DM, HTN

## 2023-08-06 NOTE — ED Provider Triage Note (Signed)
 Emergency Medicine Provider Triage Evaluation Note  Carmelo Reidel , a 48 y.o. male  was evaluated in triage.  Pt complains of high blood sugar, right upper quadrant pain.  Patient with history of diabetes, schizophrenia.  Noncompliant with medications.  Went to behavioral health urgent care today, was given crackers, milk, and Zyprexa.  Review of Systems  Positive:  Negative:   Physical Exam  There were no vitals taken for this visit. Gen:   Awake, no distress   Resp:  Normal effort  MSK:   Moves extremities without difficulty  Other:    Medical Decision Making  Medically screening exam initiated at 11:33 PM.  Appropriate orders placed.  Alick Lecomte was informed that the remainder of the evaluation will be completed by another provider, this initial triage assessment does not replace that evaluation, and the importance of remaining in the ED until their evaluation is complete.  Patient with a low blood sugar.  Overall looks well, does not appear to be tachypneic.  CBC, CMP, VBG ordered.  Soft abdomen.   Anders Simmonds T, DO 08/06/23 2334

## 2023-08-06 NOTE — Progress Notes (Addendum)
  Flowsheet Row ED from 08/06/2023 in [facility] ED from 07/16/2023 in [facility] Emergency Department at [facility] ED from 07/15/2023 in [facility] Emergency Department at [facility]  C-SSRS RISK CATEGORY No Risk No Risk No Risk

## 2023-08-06 NOTE — ED Provider Notes (Signed)
 [facility] Medical Screening Exam  Patient Name: Alejandro Baker MRN: [id number] Date of Evaluation: 08/06/23 Chief Complaint:  need some mike and crackers  Diagnosis:  Final diagnoses:  Schizoaffective disorder, bipolar type (HCC)  Homelessness unspecified    History of Present illness: Alejandro Baker is a 48 y.o. male. With a history of schizophrenia,  bipolar dx, and homelessness presented to [facility] voluntarily.  I review of patient records show patient had 108 ED visit within the last 6 months.  Patient came in with an alcohol friend who is also homeless.  Per the patient he was trying to get some food to eat.  Patient does appear very disheveled, patient keep making hand gestures as if he is boxing the air.  When asked questions patient is very selective in what he answers.  When asked if he is taking medications patient stated no.  I review of patient record could not locate if he was seeing an ACT team at this time.  Patient denies having a therapist or psychiatrist.  Face-to-face observation of patient, patient is alert and oriented x 4, speech is clear however soft spoken.  Patient appearance is unkept.  Patient does not seem to take care of himself and does have a body odor.  Patient however denies SI, HI.  Patient does have some paranoia because he stated that somebody was following him.  Patient reports that he only smoke cigarettes.  When asked if he was drinking he denied alcohol at this time.  Patient does not appear to be an threat to himself or others.    Patient was given Zyprexa 10 mg p.o.  And also given food and drink. Prior to discharge patient was given resources and told to follow up with the shelters for assistance with housing.  Patient was also encouraged to return to the nearest ED should he experience homicidal thoughts or suicidal ideation.  After the patient got something to eat patient was ready to leave.  Patient to follow-up with resources  provided.  Recommend discharge   Flowsheet Row ED from 08/06/2023 in [facility] ED from 07/16/2023 in [facility] Emergency Department at [facility] ED from 07/15/2023 in [facility] Emergency Department at [facility]  C-SSRS RISK CATEGORY No Risk No Risk No Risk       Psychiatric Specialty Exam  Presentation  General Appearance:Disheveled  Eye Contact:Fair  Speech:Clear and Coherent  Speech Volume:Decreased  Handedness:Right   Mood and Affect  Mood: Euthymic  Affect: Flat   Thought Process  Thought Processes: Linear  Descriptions of Associations:Intact  Orientation:Full (Time, Place and Person)  Thought Content:Scattered  Diagnosis of Schizophrenia or Schizoaffective disorder in past: No data recorded Duration of Psychotic Symptoms: No data recorded Hallucinations:None  Ideas of Reference:Paranoia  Suicidal Thoughts:No  Homicidal Thoughts:No   Sensorium  Memory: Immediate Fair  Judgment: Poor  Insight: Poor   Executive Functions  Concentration: Poor  Attention Span: Fair  Recall: [employer] of Knowledge: Fair  Language: Fair   Psychomotor Activity  Psychomotor Activity: Restlessness   Assets  Assets: Desire for Improvement; Housing; Social Support   Sleep  Sleep: Fair  Number of hours:  5   Physical Exam: Physical Exam HENT:     Head: Normocephalic.     Nose: Nose normal.  Eyes:     Pupils: Pupils are equal, round, and reactive to light.  Cardiovascular:     Rate and Rhythm: Tachycardia present.  Pulmonary:  Effort: Pulmonary effort is normal.  Musculoskeletal:        General: Normal range of motion.     Cervical back: Normal range of motion.  Neurological:     General: No focal deficit present.     Mental Status: He is alert.  Psychiatric:        Mood and Affect: Mood normal.        Behavior: Behavior normal.        Thought Content: Thought content normal.         Judgment: Judgment normal.    Review of Systems  Constitutional: Negative.   HENT: Negative.    Eyes: Negative.   Respiratory: Negative.    Cardiovascular: Negative.   Gastrointestinal: Negative.   Genitourinary: Negative.   Musculoskeletal: Negative.   Skin: Negative.   Neurological: Negative.   Psychiatric/Behavioral:  Positive for hallucinations and substance abuse. The patient is nervous/anxious.    Blood pressure 129/87, pulse (!) 104, temperature 98.4 F (36.9 C), temperature source Oral, resp. rate 19, SpO2 97%. There is no height or weight on file to calculate BMI.  Musculoskeletal: Strength & Muscle Tone: within normal limits Gait & Station: normal Patient leans: N/A   [facility] MSE Discharge Disposition for Follow up and Recommendations: Based on my evaluation the patient does not appear to have an emergency medical condition and can be discharged with resources and follow up care in outpatient services for Medication Management and Partial Hospitalization Program   Sindy Guadeloupe, NP 08/06/2023, 4:16 AM
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
## 2023-08-06 NOTE — ED Triage Notes (Signed)
 Pt also states he drank this evening

## 2023-08-07 ENCOUNTER — Encounter (HOSPITAL_COMMUNITY): Payer: Self-pay | Admitting: Emergency Medicine

## 2023-08-07 ENCOUNTER — Emergency Department (HOSPITAL_COMMUNITY)
Admission: EM | Admit: 2023-08-07 | Discharge: 2023-08-07 | Discharge status: Against Medical Advice | Source: Home / Self Care

## 2023-08-07 DIAGNOSIS — E1165 Type 2 diabetes mellitus with hyperglycemia: Secondary | ICD-10-CM | POA: Insufficient documentation

## 2023-08-07 DIAGNOSIS — I10 Essential (primary) hypertension: Secondary | ICD-10-CM | POA: Insufficient documentation

## 2023-08-07 DIAGNOSIS — Z5321 Procedure and treatment not carried out due to patient leaving prior to being seen by health care provider: Secondary | ICD-10-CM | POA: Insufficient documentation

## 2023-08-07 DIAGNOSIS — M79673 Pain in unspecified foot: Secondary | ICD-10-CM | POA: Insufficient documentation

## 2023-08-07 DIAGNOSIS — Z5329 Procedure and treatment not carried out because of patient's decision for other reasons: Secondary | ICD-10-CM | POA: Insufficient documentation

## 2023-08-07 DIAGNOSIS — Z5900 Homelessness unspecified: Secondary | ICD-10-CM | POA: Insufficient documentation

## 2023-08-07 NOTE — ED Notes (Signed)
 Pt. Seen exiting ED. Moved OTF

## 2023-08-07 NOTE — ED Triage Notes (Signed)
 Pt returns to the ED fro complaints of foot pain , pt has been to the Ed several times over the last 24 hrs

## 2023-08-08 ENCOUNTER — Emergency Department (HOSPITAL_COMMUNITY)
Admission: EM | Admit: 2023-08-08 | Discharge: 2023-08-09 | Disposition: A | Discharge status: Home / Self Care | Attending: Emergency Medicine | Admitting: Emergency Medicine

## 2023-08-08 DIAGNOSIS — G4489 Other headache syndrome: Secondary | ICD-10-CM | POA: Diagnosis not present

## 2023-08-08 DIAGNOSIS — R519 Headache, unspecified: Secondary | ICD-10-CM

## 2023-08-08 DIAGNOSIS — Z5900 Homelessness unspecified: Secondary | ICD-10-CM

## 2023-08-08 DIAGNOSIS — R55 Syncope and collapse: Secondary | ICD-10-CM | POA: Diagnosis not present

## 2023-08-08 DIAGNOSIS — R569 Unspecified convulsions: Secondary | ICD-10-CM | POA: Diagnosis not present

## 2023-08-09 ENCOUNTER — Other Ambulatory Visit: Payer: Self-pay

## 2023-08-09 NOTE — Discharge Instructions (Addendum)
 You may take ibuprofen or acetaminophen for headache relief.

## 2023-08-09 NOTE — ED Provider Notes (Signed)
 [place] EMERGENCY DEPARTMENT AT [facility] Provider Note   CSN: [id number] Arrival date & time: 08/08/23  2356     History  Chief Complaint  Patient presents with   Headache    Alejandro Baker is a 48 y.o. male.  Patient with past medical history significant for polysubstance abuse, schizoaffective disorder, substance-induced disorder, malingering, and homelessness presents the emergency room via EMS.  Patient was in a [employer] where staff was concerned he may have passed out.  No abnormalities/seizure-like behavior noticed by EMS.  No history of seizures.  Patient denied alcohol use or history of withdrawals.  He reports a headache.  At the time of my assessment patient is not cooperative with assessment and goes to sleep.  No acute distress noted.   Headache      Home Medications Prior to Admission medications   Medication Sig Start Date End Date Taking? Authorizing Provider  acetaminophen (TYLENOL) 500 MG tablet Take 1 tablet (500 mg total) by mouth every 6 (six) hours as needed. 05/19/23   Fayrene Helper, PA-C  bacitracin ointment Apply topically 2 times daily. Patient not taking: Reported on 07/13/2023 05/02/23   Arby Barrette, MD  metFORMIN (GLUCOPHAGE) 500 MG tablet Take 1 tablet (500 mg total) by mouth 2 (two) times daily with a meal. Patient not taking: Reported on 07/13/2023 04/22/23   Dione Booze, MD      Allergies    Penicillins and Povidone-iodine    Review of Systems   Review of Systems  Neurological:  Positive for headaches.    Physical Exam Updated Vital Signs BP 126/88 (BP Location: Left Arm)   Pulse (!) 104   Temp 98.7 F (37.1 C) (Oral)   Resp 18   SpO2 96%  Physical Exam Vitals and nursing note reviewed.  HENT:     Head: Normocephalic and atraumatic.  Eyes:     Pupils: Pupils are equal, round, and reactive to light.  Pulmonary:     Effort: Pulmonary effort is normal. No respiratory distress.  Musculoskeletal:        General: No  signs of injury.     Cervical back: Normal range of motion.  Skin:    General: Skin is dry.  Neurological:     Mental Status: He is alert.  Psychiatric:        Speech: Speech normal.        Behavior: Behavior normal.     ED Results / Procedures / Treatments   Labs (all labs ordered are listed, but only abnormal results are displayed) Labs Reviewed - No data to display  EKG None  Radiology No results found.  Procedures Procedures    Medications Ordered in ED Medications - No data to display  ED Course/ Medical Decision Making/ A&P                                 Medical Decision Making  This patient presents to the ED for concern of headache, this involves an extensive number of treatment options, and is a complaint that carries with it a high risk of complications and morbidity.  The differential diagnosis includes tension headache, migraine, cluster headache, malingering, others   Co morbidities that complicate the patient evaluation  Schizoaffective disorder   Additional history obtained:  Additional history obtained from EMS   Social Determinants of Health:  Patient is homeless   Test / Admission - Considered:  Patient reported  to nursing that he has 7 out of 10 headache but is resting comfortably at the time of my assessment.  He appears to be in no acute distress.  Question if patient is here for secondary gain as patient is homeless and the weather outside is raining this evening. History of malingering.  He is well-known to this department with 110 ED visits in the past 6 months.  No evidence of this time of any acute emergent condition requiring further emergent workup or admission.  Patient discharged in stable condition.         Final Clinical Impression(s) / ED Diagnoses Final diagnoses:  Nonintractable headache, unspecified chronicity pattern, unspecified headache type  Homelessness    Rx / DC Orders ED Discharge Orders     None          Pamala Duffel 08/09/23 0052    Nira Conn, MD 08/09/23 [phone]

## 2023-08-09 NOTE — ED Triage Notes (Signed)
 Pt was BIB [facility] from [employer]. Staff called d/t concerns as pt passed out twice and possible seizure like activity. None witnessed by EMS. No Known hx of seizure. Pt denies any daily ETOH use or hx of withdrawal. Pt reports 7/10 head pain. No other injuries.   EMS VS SBP 118, HR 112, 94% RA.

## 2023-08-10 ENCOUNTER — Emergency Department (HOSPITAL_COMMUNITY)
Admission: EM | Admit: 2023-08-10 | Discharge: 2023-08-11 | Disposition: A | Discharge status: Home / Self Care | Source: Home / Self Care | Attending: Emergency Medicine | Admitting: Emergency Medicine

## 2023-08-10 ENCOUNTER — Encounter (HOSPITAL_COMMUNITY): Payer: Self-pay | Admitting: Emergency Medicine

## 2023-08-10 ENCOUNTER — Emergency Department (HOSPITAL_COMMUNITY)
Admission: EM | Admit: 2023-08-10 | Discharge: 2023-08-10 | Disposition: A | Discharge status: Home / Self Care | Attending: Emergency Medicine | Admitting: Emergency Medicine

## 2023-08-10 ENCOUNTER — Other Ambulatory Visit: Payer: Self-pay

## 2023-08-10 DIAGNOSIS — G8929 Other chronic pain: Secondary | ICD-10-CM | POA: Insufficient documentation

## 2023-08-10 DIAGNOSIS — M79601 Pain in right arm: Secondary | ICD-10-CM | POA: Insufficient documentation

## 2023-08-10 DIAGNOSIS — M79602 Pain in left arm: Secondary | ICD-10-CM | POA: Diagnosis not present

## 2023-08-10 DIAGNOSIS — L853 Xerosis cutis: Secondary | ICD-10-CM | POA: Insufficient documentation

## 2023-08-10 DIAGNOSIS — M79671 Pain in right foot: Secondary | ICD-10-CM | POA: Diagnosis present

## 2023-08-10 DIAGNOSIS — M79605 Pain in left leg: Secondary | ICD-10-CM | POA: Diagnosis not present

## 2023-08-10 MED ORDER — ACETAMINOPHEN 325 MG PO TABS
650.0000 mg | ORAL_TABLET | Freq: Once | ORAL | Status: AC
  Administered 2023-08-10: 650 mg via ORAL
  Filled 2023-08-10: qty 2
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
## 2023-08-10 NOTE — ED Triage Notes (Signed)
 Pt ambulatory to triage. Reports bilateral foot pain. Denies injury or trauma.  No cuts to forehead noted in triage. Pt denies fevers.

## 2023-08-11 ENCOUNTER — Encounter (HOSPITAL_COMMUNITY): Payer: Self-pay | Admitting: Emergency Medicine

## 2023-08-11 NOTE — ED Notes (Signed)
 ED Provider at bedside.

## 2023-08-11 NOTE — ED Provider Notes (Signed)
  [facility]-EMERGENCY DEPT [facility] Emergency Department Provider Note MRN:  [id number]  Arrival date & time: 08/11/23     Chief Complaint   Foot Pain (Bilateral )   History of Present Illness   Alejandro Baker is a 48 y.o. year-old male presents to the ED with chief complaint of needing to sleep.  He is here frequently for various pain related complaints.  He initially told triage RN that he was having pain in his feet.  He tells me that he just needs somewhere to sleep.  He denies any new complaints tonight.  History provided by patient.   Review of Systems  Pertinent positive and negative review of systems noted in HPI.    Physical Exam   Vitals:   08/10/23 2320  BP: (!) 138/99  Pulse: (!) 110  Resp: 19  Temp: 98.2 F (36.8 C)  SpO2: 97%    CONSTITUTIONAL:  non toxic-appearing, NAD NEURO:  Alert and oriented x 3, CN 3-12 grossly intact EYES:  eyes equal and reactive ENT/NECK:  Supple, no stridor  CARDIO:  normal rate on my exam, regular rhythm, appears well-perfused  PULM:  No respiratory distress, CTAB GI/GU:  non-distended,  MSK/SPINE:  No gross deformities, no edema, moves all extremities  SKIN:  no rash, atraumatic   *Additional and/or pertinent findings included in MDM below  Diagnostic and Interventional Summary    EKG Interpretation Date/Time:    Ventricular Rate:    PR Interval:    QRS Duration:    QT Interval:    QTC Calculation:   R Axis:      Text Interpretation:         Labs Reviewed - No data to display  No orders to display    Medications - No data to display   Procedures  /  Critical Care Procedures  ED Course and Medical Decision Making  I have reviewed the triage vital signs, the nursing notes, and pertinent available records from the EMR.  Social Determinants Affecting Complexity of Care: Patient has no clinically significant social determinants affecting this chief complaint..   ED Course:    Medical Decision  Making Patient here because he needs somewhere to sleep.  He told triage RN that he had bilateral foot pain, but denies any injury or trauma.  I do not note any injury or deformity on my exam.  I informed the patient that he will not be able to stay in the ED, but I will provide him with resources for shelters.         Consultants: No consultations were needed in caring for this patient.   Treatment and Plan: Emergency department workup does not suggest an emergent condition requiring admission or immediate intervention beyond  what has been performed at this time. The patient is safe for discharge and has  been instructed to return immediately for worsening symptoms, change in  symptoms or any other concerns    Final Clinical Impressions(s) / ED Diagnoses     ICD-10-CM   1. Other chronic pain  G89.29       ED Discharge Orders     None         Discharge Instructions Discussed with and Provided to Patient:   Discharge Instructions   None      Sherel Dikes, PA-C 08/11/23 0103    Roberts Ching, MD 08/11/23 0500

## 2023-08-13 ENCOUNTER — Other Ambulatory Visit: Payer: Self-pay

## 2023-08-13 ENCOUNTER — Emergency Department (HOSPITAL_COMMUNITY)
Admission: EM | Admit: 2023-08-13 | Discharge: 2023-08-13 | Disposition: A | Discharge status: Home / Self Care | Attending: Emergency Medicine | Admitting: Emergency Medicine

## 2023-08-13 ENCOUNTER — Encounter (HOSPITAL_COMMUNITY): Payer: Self-pay

## 2023-08-13 DIAGNOSIS — R1084 Generalized abdominal pain: Secondary | ICD-10-CM | POA: Diagnosis not present

## 2023-08-13 DIAGNOSIS — Z5900 Homelessness unspecified: Secondary | ICD-10-CM | POA: Diagnosis not present

## 2023-08-13 DIAGNOSIS — I10 Essential (primary) hypertension: Secondary | ICD-10-CM | POA: Diagnosis not present

## 2023-08-13 DIAGNOSIS — R1110 Vomiting, unspecified: Secondary | ICD-10-CM | POA: Insufficient documentation

## 2023-08-13 DIAGNOSIS — R739 Hyperglycemia, unspecified: Secondary | ICD-10-CM | POA: Diagnosis not present

## 2023-08-13 DIAGNOSIS — R112 Nausea with vomiting, unspecified: Secondary | ICD-10-CM | POA: Diagnosis not present

## 2023-08-13 DIAGNOSIS — R197 Diarrhea, unspecified: Secondary | ICD-10-CM | POA: Diagnosis not present

## 2023-08-13 NOTE — ED Provider Notes (Signed)
  EMERGENCY DEPARTMENT AT [facility] Provider Note   CSN: [id number] Arrival date & time: 08/13/23  4098     History  Chief Complaint  Patient presents with   Nausea   Emesis    Alejandro Baker is a 48 y.o. male.  48 year old male, well-known to emergency room.  He is here today for reported emesis.  Patient brought in by EMS.   Emesis      Home Medications Prior to Admission medications   Medication Sig Start Date End Date Taking? Authorizing Provider  acetaminophen (TYLENOL) 500 MG tablet Take 1 tablet (500 mg total) by mouth every 6 (six) hours as needed. 05/19/23   Fayrene Helper, PA-C  bacitracin ointment Apply topically 2 times daily. Patient not taking: Reported on 07/13/2023 05/02/23   Arby Barrette, MD  metFORMIN (GLUCOPHAGE) 500 MG tablet Take 1 tablet (500 mg total) by mouth 2 (two) times daily with a meal. Patient not taking: Reported on 07/13/2023 04/22/23   Dione Booze, MD      Allergies    Penicillins and Povidone-iodine    Review of Systems   Review of Systems  Gastrointestinal:  Positive for vomiting.    Physical Exam Updated Vital Signs BP (!) 135/93 (BP Location: Right Arm)   Pulse (!) 111   Temp 97.8 F (36.6 C) (Oral)   Resp 18   Ht 6' (1.829 m)   Wt 88 kg   SpO2 96%   BMI 26.31 kg/m  Physical Exam Vitals reviewed.  Constitutional:      Appearance: He is not ill-appearing or toxic-appearing.  Eyes:     Pupils: Pupils are equal, round, and reactive to light.  Pulmonary:     Effort: Pulmonary effort is normal.  Abdominal:     General: Abdomen is flat. There is no distension.     Palpations: Abdomen is soft.     Tenderness: There is no abdominal tenderness. There is no guarding.  Musculoskeletal:        General: No swelling. Normal range of motion.     Cervical back: Normal range of motion.  Skin:    General: Skin is warm.  Neurological:     General: No focal deficit present.     Mental Status: He is alert.      Gait: Gait normal.     ED Results / Procedures / Treatments   Labs (all labs ordered are listed, but only abnormal results are displayed) Labs Reviewed - No data to display  EKG None  Radiology No results found.  Procedures Procedures    Medications Ordered in ED Medications - No data to display  ED Course/ Medical Decision Making/ A&P                                 Medical Decision Making 48 year old male here today for reported emesis.  Plan-patient currently experiencing homelessness.  I entered the room, the patient was sleeping.  I woke the patient up, he swung his hand at me to swat me away.  I asked patient how he was feeling, he shook his head yes.  He has no abdominal tenderness.  He was eating and drinking for [place] here in the emergency room.  He overall looks well.  Will discharge.  This patient's health care is complicated by the following social determinants of health-housing insecurity.           Final Clinical  Impression(s) / ED Diagnoses Final diagnoses:  Vomiting, unspecified vomiting type, unspecified whether nausea present    Rx / DC Orders ED Discharge Orders     None         [name], DO 08/13/23 [phone]

## 2023-08-13 NOTE — Discharge Instructions (Addendum)
 Follow-up with the [facility] as needed.

## 2023-08-13 NOTE — ED Triage Notes (Addendum)
 Pt BIB EMS with reports of n/v/d since this morning. Pt was picked up at [facility]. Pt drinking water out of the sink.

## 2023-08-14 ENCOUNTER — Emergency Department (HOSPITAL_COMMUNITY)
Admission: EM | Admit: 2023-08-14 | Discharge: 2023-08-14 | Disposition: A | Discharge status: Home / Self Care | Attending: Emergency Medicine | Admitting: Emergency Medicine

## 2023-08-14 ENCOUNTER — Other Ambulatory Visit: Payer: Self-pay

## 2023-08-14 DIAGNOSIS — R519 Headache, unspecified: Secondary | ICD-10-CM | POA: Insufficient documentation

## 2023-08-14 MED ORDER — IBUPROFEN 200 MG PO TABS
400.0000 mg | ORAL_TABLET | Freq: Once | ORAL | Status: AC | PRN
  Administered 2023-08-14: 400 mg via ORAL
  Filled 2023-08-14: qty 2
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
## 2023-08-14 NOTE — ED Provider Notes (Signed)
  [facility]ENCY DEPT [facility] Emergency Department Provider Note MRN:  [id number]  Arrival date & time: 08/14/23     Chief Complaint   Headache   History of Present Illness   Alejandro Baker is a 48 y.o. year-old male presents to the ED with chief complaint of headache.  This is not a new problem.  Denies any treatments PTA.  Seen several times this week for similar.  Denies new complaints.  History provided by patient.   Review of Systems  Pertinent positive and negative review of systems noted in HPI.    Physical Exam   Vitals:   08/14/23 0055  BP: (!) 151/106  Pulse: 100  Resp: 17  Temp: 98.1 F (36.7 C)  SpO2: 96%    CONSTITUTIONAL:  non toxic-appearing, NAD NEURO:  Alert and oriented x 3, CN 3-12 grossly intact, movements are goal oriented EYES:  eyes equal and reactive ENT/NECK:  Supple, no stridor  CARDIO:  normal rate, regular rhythm, appears well-perfused  PULM:  No respiratory distress,  GI/GU:  non-distended,  MSK/SPINE:  No gross deformities, no edema, moves all extremities  SKIN:  no rash, atraumatic, dry skin on scalp, no hematoma, contusion, or deformity   *Additional and/or pertinent findings included in MDM below  Diagnostic and Interventional Summary    EKG Interpretation Date/Time:    Ventricular Rate:    PR Interval:    QRS Duration:    QT Interval:    QTC Calculation:   R Axis:      Text Interpretation:         Labs Reviewed - No data to display  No orders to display    Medications  ibuprofen (ADVIL) tablet 400 mg (400 mg Oral Given 08/14/23 0109)     Procedures  /  Critical Care Procedures  ED Course and Medical Decision Making  I have reviewed the triage vital signs, the nursing notes, and pertinent available records from the EMR.  Social Determinants Affecting Complexity of Care: Patient has decreased access to medical care.   ED Course:    Medical Decision Making Patient here with headache.  This is not a  new complaint.  He appears in his normal state of health.  Treated with ibuprofen in triage.  Risk OTC drugs.         Consultants: No consultations were needed in caring for this patient.   Treatment and Plan: Emergency department workup does not suggest an emergent condition requiring admission or immediate intervention beyond  what has been performed at this time. The patient is safe for discharge and has  been instructed to return immediately for worsening symptoms, change in  symptoms or any other concerns    Final Clinical Impressions(s) / ED Diagnoses     ICD-10-CM   1. Nonintractable headache, unspecified chronicity pattern, unspecified headache type  R51.9       ED Discharge Orders     None         Discharge Instructions Discussed with and Provided to Patient:   Discharge Instructions   None      Sherel Dikes, PA-C 08/14/23 0149    Kelsey Patricia, MD 08/14/23 [phone]

## 2023-08-21 ENCOUNTER — Other Ambulatory Visit: Payer: Self-pay

## 2023-08-21 ENCOUNTER — Encounter: Payer: Self-pay | Admitting: *Deleted

## 2023-08-21 DIAGNOSIS — M79671 Pain in right foot: Secondary | ICD-10-CM | POA: Diagnosis not present

## 2023-08-21 DIAGNOSIS — I10 Essential (primary) hypertension: Secondary | ICD-10-CM | POA: Diagnosis not present

## 2023-08-21 DIAGNOSIS — M79672 Pain in left foot: Secondary | ICD-10-CM | POA: Insufficient documentation

## 2023-08-21 DIAGNOSIS — J45909 Unspecified asthma, uncomplicated: Secondary | ICD-10-CM | POA: Diagnosis not present

## 2023-08-21 DIAGNOSIS — E1140 Type 2 diabetes mellitus with diabetic neuropathy, unspecified: Secondary | ICD-10-CM | POA: Diagnosis not present

## 2023-08-21 DIAGNOSIS — Z5900 Homelessness unspecified: Secondary | ICD-10-CM | POA: Diagnosis not present

## 2023-08-21 DIAGNOSIS — Z7984 Long term (current) use of oral hypoglycemic drugs: Secondary | ICD-10-CM | POA: Insufficient documentation

## 2023-08-21 DIAGNOSIS — R519 Headache, unspecified: Secondary | ICD-10-CM | POA: Diagnosis not present

## 2023-08-21 NOTE — ED Triage Notes (Signed)
 Pt ambulatory to triage.  Pt reports blisters on both feet.  Both feet red.  Reports pain when ambulating  pt alert.

## 2023-08-22 ENCOUNTER — Other Ambulatory Visit: Payer: Self-pay

## 2023-08-22 ENCOUNTER — Emergency Department
Admission: EM | Admit: 2023-08-22 | Discharge: 2023-08-22 | Disposition: A | Discharge status: Home / Self Care | Source: Home / Self Care

## 2023-08-22 ENCOUNTER — Emergency Department
Admission: EM | Admit: 2023-08-22 | Discharge: 2023-08-22 | Disposition: A | Discharge status: Home / Self Care | Attending: Emergency Medicine | Admitting: Emergency Medicine

## 2023-08-22 ENCOUNTER — Encounter: Payer: Self-pay | Admitting: Emergency Medicine

## 2023-08-22 DIAGNOSIS — E1140 Type 2 diabetes mellitus with diabetic neuropathy, unspecified: Secondary | ICD-10-CM | POA: Insufficient documentation

## 2023-08-22 DIAGNOSIS — I10 Essential (primary) hypertension: Secondary | ICD-10-CM | POA: Insufficient documentation

## 2023-08-22 DIAGNOSIS — M79672 Pain in left foot: Secondary | ICD-10-CM | POA: Insufficient documentation

## 2023-08-22 DIAGNOSIS — M79671 Pain in right foot: Secondary | ICD-10-CM | POA: Insufficient documentation

## 2023-08-22 DIAGNOSIS — G8929 Other chronic pain: Secondary | ICD-10-CM

## 2023-08-22 DIAGNOSIS — R519 Headache, unspecified: Secondary | ICD-10-CM | POA: Insufficient documentation

## 2023-08-22 DIAGNOSIS — Z5900 Homelessness unspecified: Secondary | ICD-10-CM

## 2023-08-22 MED ORDER — IBUPROFEN 800 MG PO TABS
800.0000 mg | ORAL_TABLET | Freq: Once | ORAL | Status: AC
  Administered 2023-08-22: 800 mg via ORAL
  Filled 2023-08-22: qty 1

## 2023-08-22 MED ORDER — IBUPROFEN 400 MG PO TABS
600.0000 mg | ORAL_TABLET | Freq: Once | ORAL | Status: AC
  Administered 2023-08-22: 600 mg via ORAL
  Filled 2023-08-22: qty 2

## 2023-08-22 NOTE — ED Provider Notes (Signed)
 [facility] Provider Note   None    (approximate) History  Foot Pain  HPI Alejandro Baker is a 48 y.o. male with a past medical history of homelessness, hypertension, diabetes, schizoaffective disorder, neuropathy, malingering, and aggressive behavior presents emergency department with complaints of bilateral foot pain after he was seen approximately 6 hours prior for the same symptoms.  Patient states that ambulation causes more pain or when he has to change his socks.  Patient states that he is homeless and walks every day and therefore has blisters on his feet.  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  Patient states this headache is chronic for him ROS: Patient currently denies any vision changes, tinnitus, difficulty speaking, facial droop, sore throat, chest pain, shortness of breath, abdominal pain, nausea/vomiting/diarrhea, dysuria, or weakness/numbness/paresthesias in any extremity   Physical Exam  Triage Vital Signs: ED Triage Vitals  Encounter Vitals Group     BP 08/22/23 0722 128/86     Systolic BP Percentile --      Diastolic BP Percentile --      Pulse Rate 08/22/23 0722 95     Resp 08/22/23 0722 18     Temp --      Temp src --      SpO2 08/22/23 0722 98 %     Weight 08/22/23 0721 187 lb 6.3 oz (85 kg)     Height 08/22/23 0721 6' (1.829 m)     Head Circumference --      Peak Flow --      Pain Score 08/22/23 0721 8     Pain Loc --      Pain Education --      Exclude from Growth Chart --    Most recent vital signs: Vitals:   08/22/23 0722 08/22/23 0725  BP: 128/86   Pulse: 95   Resp: 18   Temp:  98.5 F (36.9 C)  SpO2: 98%    General: Awake, oriented x4. CV:  Good peripheral perfusion.  Resp:  Normal effort.  Abd:  No distention.  Other:  Calluses noted to bottom of bilateral feet with no open wounds.  No swelling or induration.  No point tenderness, no soft tissue swelling or bruising.   Resting comfortably in no acute distress ED Results / Procedures / Treatments  Labs (all labs ordered are listed, but only abnormal results are displayed) Labs Reviewed - No data to display PROCEDURES: Critical Care performed: No Procedures MEDICATIONS ORDERED IN ED: Medications - No data to display IMPRESSION / MDM / ASSESSMENT AND PLAN / ED COURSE  I reviewed the triage vital signs and the nursing notes.                             The patient is on the cardiac monitor to evaluate for evidence of arrhythmia and/or significant heart rate changes. Patient's presentation is most consistent with acute presentation with potential threat to life or bodily function. 48 year old male presents for the 111 time in the last 6 months complaining of bilateral foot pain Given history, exam and workup I have low suspicion for fracture, dislocation, significant ligamentous injury, septic arthritis, gout flare, new autoimmune arthropathy, or gonococcal arthropathy.  Interventions: Dressing to blisters, ibuprofen  Disposition: Discharge home with strict return precautions and instructions for prompt primary care follow up in the next week.   FINAL CLINICAL IMPRESSION(S) / ED DIAGNOSES  Final diagnoses:  Bilateral foot pain  Chronic nonintractable headache, unspecified headache type   Rx / DC Orders   ED Discharge Orders     None      Note:  This document was prepared using Dragon voice recognition software and may include unintentional dictation errors.   Charleen Conn, MD 08/22/23 [phone]

## 2023-08-22 NOTE — ED Provider Notes (Signed)
 [facility] Provider Note    Event Date/Time   First MD Initiated Contact with Patient 08/22/23 [phone]     (approximate)   History   Blister   HPI  Alejandro Baker is a 48 y.o. male with history of homelessness, hypertension, diabetes, schizoaffective disorder, neuropathy, malingering, aggressive behavior who presents to the emergency department complaints of bilateral foot pain.  Denies any injury.  Is ambulatory but states this causes more pain.  No calf tenderness or calf swelling.  States he has been walking a lot recently.  Of note, patient has had 109 visits to the [facility] health emergency departments in the past 6 months.   History provided by patient.    Past Medical History:  Diagnosis Date   Asthma    DM (diabetes mellitus) (HCC)    Homeless    Hypertension    Malingering    Neuropathy    Schizoaffective disorder, bipolar type (HCC)     History reviewed. No pertinent surgical history.  MEDICATIONS:  Prior to Admission medications   Medication Sig Start Date End Date Taking? Authorizing Provider  acetaminophen  (TYLENOL ) 500 MG tablet Take 1 tablet (500 mg total) by mouth every 6 (six) hours as needed. 05/19/23   Debbra Fairy, PA-C  bacitracin  ointment Apply topically 2 times daily. Patient not taking: Reported on 07/13/2023 05/02/23   Wynetta Heckle, MD  metFORMIN  (GLUCOPHAGE ) 500 MG tablet Take 1 tablet (500 mg total) by mouth 2 (two) times daily with a meal. Patient not taking: Reported on 07/13/2023 04/22/23   Alissa April, MD    Physical Exam   Triage Vital Signs: ED Triage Vitals  Encounter Vitals Group     BP 08/21/23 2354 118/87     Systolic BP Percentile --      Diastolic BP Percentile --      Pulse Rate 08/21/23 2354 (!) 118     Resp 08/21/23 2354 20     Temp 08/21/23 2354 98.3 F (36.8 C)     Temp Source 08/21/23 2354 Oral     SpO2 08/21/23 2354 98 %     Weight 08/21/23 2355 186 lb (84.4 kg)     Height 08/21/23 2355 6'  (1.829 m)     Head Circumference --      Peak Flow --      Pain Score 08/21/23 2355 6     Pain Loc --      Pain Education --      Exclude from Growth Chart --     Most recent vital signs: Vitals:   08/21/23 2354 08/22/23 0129  BP: 118/87   Pulse: (!) 118 90  Resp: 20 20  Temp: 98.3 F (36.8 C)   SpO2: 98% 96%    CONSTITUTIONAL: Alert, responds appropriately to questions.  Chronically ill-appearing, appears much older than stated age HEAD: Normocephalic, atraumatic EYES: Conjunctivae clear, pupils appear equal, sclera nonicteric ENT: normal nose; moist mucous membranes NECK: Supple, normal ROM CARD: RRR; S1 and S2 appreciated RESP: Normal chest excursion without splinting or tachypnea; breath sounds clear and equal bilaterally; no wheezes, no rhonchi, no rales, no hypoxia or respiratory distress, speaking full sentences ABD/GI: Non-distended; soft, non-tender, no rebound, no guarding, no peritoneal signs BACK: The back appears normal EXT: Calluses noted to the bottom of both feet but no open wounds.  No increased redness or warmth.  Feet seem to be nontender to palpation without bony deformity, soft tissue swelling, ecchymosis.  2+ DP pulses bilaterally.  No calf tenderness or calf swelling. SKIN: Normal color for age and race; warm; no rash on exposed skin NEURO: Moves all extremities equally, normal speech, ambulates with slow but steady gait PSYCH: The patient's mood and manner are appropriate.   ED Results / Procedures / Treatments   LABS: (all labs ordered are listed, but only abnormal results are displayed) Labs Reviewed - No data to display   EKG:   RADIOLOGY: My personal review and interpretation of imaging:    I have personally reviewed all radiology reports.   No results found.   PROCEDURES:  Critical Care performed: No      Procedures    IMPRESSION / MDM / ASSESSMENT AND PLAN / ED COURSE  I reviewed the triage vital signs and the nursing  notes.    Patient with complaints of bilateral foot pain.  He is homeless.     DIFFERENTIAL DIAGNOSIS (includes but not limited to):   Bilateral foot pain secondary to increased walking, doubt fracture, no signs of dislocation, cellulitis, septic arthritis, gout, DVT, arterial obstruction, compartment syndrome.  Differential also includes homelessness, malingering.   Patient's presentation is most consistent with acute, uncomplicated illness.   PLAN: Patient here with bilateral foot pain.  Exam is unremarkable.  No indication for x-rays.  No sign of infection.  Normal pulses, capillary refill.  Soft compartments.  No calf tenderness.  He is able to ambulate.  Will give ibuprofen  here.  He is asking for something to eat.  I suspect there may be some component of malingering secondary to homelessness.  He has previously been aggressive to ER staff.  Security at bedside for discharge.   MEDICATIONS GIVEN IN ED: Medications  ibuprofen  (ADVIL ) tablet 800 mg (800 mg Oral Given 08/22/23 0127)     ED COURSE:  At this time, I do not feel there is any life-threatening condition present. I reviewed all nursing notes, vitals, pertinent previous records.  All lab and urine results, EKGs, imaging ordered have been independently reviewed and interpreted by myself.  I reviewed all available radiology reports from any imaging ordered this visit.  Based on my assessment, I feel the patient is safe to be discharged home without further emergent workup and can continue workup as an outpatient as needed. Discussed all findings, treatment plan as well as usual and customary return precautions.  They verbalize understanding and are comfortable with this plan.  Outpatient follow-up has been provided as needed.  All questions have been answered.    CONSULTS:  none   OUTSIDE RECORDS REVIEWED: Reviewed last psychiatric note in April 2025.       FINAL CLINICAL IMPRESSION(S) / ED DIAGNOSES   Final  diagnoses:  Bilateral foot pain  Homelessness     Rx / DC Orders   ED Discharge Orders     None        Note:  This document was prepared using Dragon voice recognition software and may include unintentional dictation errors.   Kacin Dancy, Clover Dao, DO 08/22/23 [phone]

## 2023-08-22 NOTE — Discharge Instructions (Signed)
You may alternate Tylenol 1000 mg every 6 hours as needed for pain, fever and Ibuprofen 800 mg every 6-8 hours as needed for pain, fever.  Please take Ibuprofen with food.  Do not take more than 4000 mg of Tylenol (acetaminophen) in a 24 hour period. ° °

## 2023-08-22 NOTE — ED Notes (Signed)
 Pt d/c by this RN and ambulated out of department with security.

## 2023-08-22 NOTE — ED Notes (Signed)
 Dressed pt's L ankle with hydrocolloid dressing over open blister on the L ankle. Also give pt socks to decrease moisture.

## 2023-08-22 NOTE — ED Triage Notes (Signed)
 Pt is homeless, c/o bilateral feet pain was seen last night a discharged. States he just wants "cream for his feet pain." Pt is A&Ox4 and NAD, ambulatory to triage.

## 2023-09-17 ENCOUNTER — Emergency Department
Admission: EM | Admit: 2023-09-17 | Discharge: 2023-09-17 | Disposition: A | Discharge status: Home / Self Care | Attending: Emergency Medicine | Admitting: Emergency Medicine

## 2023-09-17 ENCOUNTER — Other Ambulatory Visit: Payer: Self-pay

## 2023-09-17 DIAGNOSIS — S60561A Insect bite (nonvenomous) of right hand, initial encounter: Secondary | ICD-10-CM | POA: Diagnosis present

## 2023-09-17 DIAGNOSIS — W57XXXA Bitten or stung by nonvenomous insect and other nonvenomous arthropods, initial encounter: Secondary | ICD-10-CM | POA: Insufficient documentation

## 2023-09-17 MED ORDER — DOXYCYCLINE HYCLATE 100 MG PO TABS
100.0000 mg | ORAL_TABLET | Freq: Once | ORAL | Status: AC
  Administered 2023-09-17: 100 mg via ORAL
  Filled 2023-09-17: qty 1

## 2023-09-17 MED ORDER — DOXYCYCLINE HYCLATE 100 MG PO TABS
100.0000 mg | ORAL_TABLET | Freq: Two times a day (BID) | ORAL | 0 refills | Status: AC
  Filled 2023-09-17: qty 10, 5d supply, fill #0

## 2023-09-17 NOTE — ED Provider Notes (Signed)
 [facility] Provider Note    Event Date/Time   First MD Initiated Contact with Patient 09/17/23 1935     (approximate)   History   Insect Bite   HPI Alejandro Baker is a 48 y.o. male here for insect bite.  Thinks he got bit within the last 2 days on his right hand.  Noted a punctate spot with some redness around the area.  No other swelling in the hand.  Denies injury elsewhere.     Physical Exam   Triage Vital Signs: ED Triage Vitals  Encounter Vitals Group     BP 09/17/23 1935 (!) 150/104     Systolic BP Percentile --      Diastolic BP Percentile --      Pulse Rate 09/17/23 1935 (!) 110     Resp 09/17/23 1935 18     Temp 09/17/23 1935 98.5 F (36.9 C)     Temp src --      SpO2 09/17/23 1935 97 %     Weight 09/17/23 1933 190 lb (86.2 kg)     Height 09/17/23 1933 6' (1.829 m)     Head Circumference --      Peak Flow --      Pain Score 09/17/23 1933 0     Pain Loc --      Pain Education --      Exclude from Growth Chart --     Most recent vital signs: Vitals:   09/17/23 1935 09/17/23 2002  BP: (!) 150/104   Pulse: (!) 110   Resp: 18   Temp: 98.5 F (36.9 C)   SpO2: 97% 98%   I have reviewed the vital signs. General:  Awake, alert, no acute distress. Head:  Normocephalic, Atraumatic. EENT:  PERRL, EOMI, Oral mucosa pink and moist, Neck is supple. Cardiovascular: Regular rate, 2+ distal pulses. Respiratory:  Normal respiratory effort, symmetrical expansion, no distress.   Extremities:  Moving all four extremities through full ROM without pain.   Neuro:  Alert and oriented.  Interacting appropriately.   Skin: 1 punctate spot around the fourth MCP with slight redness in the area possibly indicating insect bite Psych: Appropriate affect.    ED Results / Procedures / Treatments   Labs (all labs ordered are listed, but only abnormal results are displayed) Labs Reviewed - No data to  display   EKG    RADIOLOGY    PROCEDURES:  Critical Care performed: No  Procedures   MEDICATIONS ORDERED IN ED: Medications  doxycycline  (VIBRA -TABS) tablet 100 mg (100 mg Oral Given 09/17/23 2000)     IMPRESSION / MDM / ASSESSMENT AND PLAN / ED COURSE  I reviewed the triage vital signs and the nursing notes.                              Differential diagnosis includes, but is not limited to, insect bite  Patient's presentation is most consistent with acute, uncomplicated illness.  Patient is a 48 year old male presenting today for potential insect bite to his right fourth MCP.  No significant cellulitis in the area but does appear like an insect bite on exam.  No other acute concerns on his exam elsewise today.  Will start on doxycycline  and safer discharge.     FINAL CLINICAL IMPRESSION(S) / ED DIAGNOSES   Final diagnoses:  Insect bite of right hand, initial encounter     Rx / DC  Orders   ED Discharge Orders          Ordered    doxycycline  (VIBRA -TABS) 100 MG tablet  2 times daily        09/17/23 1953             Note:  This document was prepared using Dragon voice recognition software and may include unintentional dictation errors.   Kandee Orion, MD 09/17/23 2003

## 2023-09-17 NOTE — ED Triage Notes (Signed)
 Pt to ED via [facility] c/o insect bites. Says it happened last night. Doesn't know what bit him. Denies pain

## 2023-09-17 NOTE — ED Notes (Signed)
 First nurse note: [place] [employer] [employer] called EMS, pt was lying on floor in front of [employer]. Pt told EMS is nauseous. No pain. Pt is walking around lobby talking ti security guards. Pt would not answer EMS questions. Talking to self and laughing.  EMS VS: 139/96, HR 82, 100% RA.  Pt has walked outside.

## 2023-09-18 ENCOUNTER — Other Ambulatory Visit: Payer: Self-pay

## 2023-09-18 ENCOUNTER — Emergency Department
Admission: EM | Admit: 2023-09-18 | Discharge: 2023-09-18 | Disposition: A | Discharge status: Home / Self Care | Attending: Emergency Medicine | Admitting: Emergency Medicine

## 2023-09-18 ENCOUNTER — Other Ambulatory Visit (HOSPITAL_COMMUNITY): Payer: Self-pay

## 2023-09-18 DIAGNOSIS — E119 Type 2 diabetes mellitus without complications: Secondary | ICD-10-CM | POA: Diagnosis not present

## 2023-09-18 DIAGNOSIS — M542 Cervicalgia: Secondary | ICD-10-CM | POA: Insufficient documentation

## 2023-09-18 DIAGNOSIS — Z7984 Long term (current) use of oral hypoglycemic drugs: Secondary | ICD-10-CM | POA: Diagnosis not present

## 2023-09-18 DIAGNOSIS — I10 Essential (primary) hypertension: Secondary | ICD-10-CM | POA: Diagnosis not present

## 2023-09-18 DIAGNOSIS — Z5900 Homelessness unspecified: Secondary | ICD-10-CM | POA: Insufficient documentation

## 2023-09-18 DIAGNOSIS — J45909 Unspecified asthma, uncomplicated: Secondary | ICD-10-CM | POA: Insufficient documentation

## 2023-09-18 NOTE — ED Triage Notes (Signed)
 Pt to ED via POV c/o neck pain. Pt denies any recent injury. States he fell years ago and pain is from that fall.

## 2023-09-18 NOTE — ED Notes (Signed)
 Pt ambulatory to hallway bed from triage.  Pt reports he fell off a fence today and then said it was a roof.  Pt states he has neck pain.  No loc.  No headache   no n/v  Pt reports falling approx 10 feet.  Pt denies back pain.  Pt alert.    Pt telling different events to this er rn from triage nurse.

## 2023-09-18 NOTE — ED Notes (Signed)
 Pt missing from his hallway bed and found in the behavioral bathroom naked attempting to bathe in sink. Pt instructed to re-apply all clothing and obtain his belongings. Pt escorted back to his hallway bed. ED provider to see pt at this time.

## 2023-09-18 NOTE — ED Notes (Signed)
 Pt reports pain in neck after falling off a roof today

## 2023-09-18 NOTE — ED Notes (Signed)
 Per EDP, pt to be discharged and no medication needed prior to departure. Upon discharge, pt attempting to escalate if no medication is given to him. Scientific laboratory technician present to escort pt off [facility]ase due to his previous trespass order from hospital property. Pt swearing and verbalizing threats as he is escorted out of department.

## 2023-09-18 NOTE — ED Provider Notes (Signed)
 [facility] Provider Note    Event Date/Time   First MD Initiated Contact with Patient 09/18/23 2305     (approximate)   History   Neck Pain   HPI  Alejandro Baker is a 48 y.o. male with history of hypertension, diabetes, schizoaffective disorder, homelessness, malingering with complaints of right-sided neck pain.  Patient has multiple reports of how he hurt his neck.  Initially told triage staff that he hurt his neck years ago.  Then told 1 nurse that he fell off a fence and then told someone he fell off of a roof.  He is turning and moving his head and neck normally.  He is ambulatory with a steady gait.  He denies any numbness or other injury.   History provided by patient.    Past Medical History:  Diagnosis Date   Asthma    DM (diabetes mellitus) (HCC)    Homeless    Hypertension    Malingering    Neuropathy    Schizoaffective disorder, bipolar type (HCC)     History reviewed. No pertinent surgical history.  MEDICATIONS:  Prior to Admission medications   Medication Sig Start Date End Date Taking? Authorizing Provider  acetaminophen  (TYLENOL ) 500 MG tablet Take 1 tablet (500 mg total) by mouth every 6 (six) hours as needed. 05/19/23   Debbra Fairy, PA-C  bacitracin  ointment Apply topically 2 times daily. Patient not taking: Reported on 07/13/2023 05/02/23   Wynetta Heckle, MD  doxycycline  (VIBRA -TABS) 100 MG tablet Take 1 tablet (100 mg total) by mouth 2 (two) times daily for 5 days. 09/17/23 09/23/23  Kandee Orion, MD  metFORMIN  (GLUCOPHAGE ) 500 MG tablet Take 1 tablet (500 mg total) by mouth 2 (two) times daily with a meal. Patient not taking: Reported on 07/13/2023 04/22/23   Alissa April, MD    Physical Exam   Triage Vital Signs: ED Triage Vitals [09/18/23 2215]  Encounter Vitals Group     BP 132/85     Systolic BP Percentile      Diastolic BP Percentile      Pulse Rate 98     Resp 17     Temp 98.4 F (36.9 C)     Temp src       SpO2 99 %     Weight      Height      Head Circumference      Peak Flow      Pain Score 6     Pain Loc      Pain Education      Exclude from Growth Chart     Most recent vital signs: Vitals:   09/18/23 2215  BP: 132/85  Pulse: 98  Resp: 17  Temp: 98.4 F (36.9 C)  SpO2: 99%    CONSTITUTIONAL: Alert, responds appropriately to questions. Well-appearing; well-nourished HEAD: Normocephalic, atraumatic EYES: Conjunctivae clear, pupils appear equal, sclera nonicteric ENT: normal nose; moist mucous membranes NECK: Supple, normal ROM, no midline spinal tenderness or step-off or deformity, no meningismus, no soft tissue swelling or bruising, trachea midline, no thyromegaly CARD: RRR; S1 and S2 appreciated RESP: Normal chest excursion without splinting or tachypnea; breath sounds clear and equal bilaterally; no wheezes, no rhonchi, no rales, no hypoxia or respiratory distress, speaking full sentences ABD/GI: Non-distended; soft, non-tender, no rebound, no guarding, no peritoneal signs BACK: The back appears normal, no midline spinal tenderness or step-off or deformity EXT: Normal ROM in all joints; no deformity noted, no edema  SKIN: Normal color for age and race; warm; no rash on exposed skin NEURO: Moves all extremities equally, normal speech, normal gait, reports normal sensation diffusely PSYCH: The patient's mood and manner are appropriate.   ED Results / Procedures / Treatments   LABS: (all labs ordered are listed, but only abnormal results are displayed) Labs Reviewed - No data to display   EKG:   RADIOLOGY: My personal review and interpretation of imaging:    I have personally reviewed all radiology reports.   No results found.   PROCEDURES:  Critical Care performed: No     Procedures    IMPRESSION / MDM / ASSESSMENT AND PLAN / ED COURSE  I reviewed the triage vital signs and the nursing notes.    Patient here after reported fall with right lateral  neck pain.     DIFFERENTIAL DIAGNOSIS (includes but not limited to):   Cervical sprain, strain, contusion, low suspicion clinically for fracture.  Doubt critical spinal stenosis, cervical myelopathy, epidural abscess or hematoma, discitis or osteomyelitis.  Also suspect that malingering, homelessness may be playing a role.   Patient's presentation is most consistent with acute complicated illness / injury requiring diagnostic workup.   PLAN: No imaging indicated at this time.  He has no midline tenderness and is neurologically intact.  Ambulating with normal gait.  Normal sensation.  Recommended Tylenol , ibuprofen  for pain control.  He is moving his neck without any difficulty.  No signs of traumatic injury on exam.  I feel he is safe for discharge.   MEDICATIONS GIVEN IN ED: Medications - No data to display   ED COURSE:  At this time, I do not feel there is any life-threatening condition present. I reviewed all nursing notes, vitals, pertinent previous records.  All lab and urine results, EKGs, imaging ordered have been independently reviewed and interpreted by myself.  I reviewed all available radiology reports from any imaging ordered this visit.  Based on my assessment, I feel the patient is safe to be discharged home without further emergent workup and can continue workup as an outpatient as needed. Discussed all findings, treatment plan as well as usual and customary return precautions.  They verbalize understanding and are comfortable with this plan.  Outpatient follow-up has been provided as needed.  All questions have been answered.    CONSULTS:  none   OUTSIDE RECORDS REVIEWED: Reviewed most recent psychiatric notes.       FINAL CLINICAL IMPRESSION(S) / ED DIAGNOSES   Final diagnoses:  Neck pain on right side     Rx / DC Orders   ED Discharge Orders     None        Note:  This document was prepared using Dragon voice recognition software and may include  unintentional dictation errors.   Mayte Diers, Clover Dao, DO 09/19/23 0006

## 2023-09-30 ENCOUNTER — Other Ambulatory Visit (HOSPITAL_COMMUNITY): Payer: Self-pay
